# Patient Record
Sex: Female | Born: 1955 | Race: White | Hispanic: No | Marital: Married | Smoking: Never smoker
Health system: Southern US, Community
[De-identification: ages and names within clinical notes are randomized; demographics above are authoritative.]

## PROBLEM LIST (undated history)

## (undated) DIAGNOSIS — Z9889 Other specified postprocedural states: Secondary | ICD-10-CM

## (undated) DIAGNOSIS — I739 Peripheral vascular disease, unspecified: Secondary | ICD-10-CM

## (undated) DIAGNOSIS — D45 Polycythemia vera: Secondary | ICD-10-CM

## (undated) DIAGNOSIS — I2510 Atherosclerotic heart disease of native coronary artery without angina pectoris: Secondary | ICD-10-CM

## (undated) DIAGNOSIS — T7840XA Allergy, unspecified, initial encounter: Secondary | ICD-10-CM

## (undated) DIAGNOSIS — D649 Anemia, unspecified: Secondary | ICD-10-CM

## (undated) DIAGNOSIS — A630 Anogenital (venereal) warts: Secondary | ICD-10-CM

## (undated) DIAGNOSIS — F509 Eating disorder, unspecified: Secondary | ICD-10-CM

## (undated) DIAGNOSIS — R011 Cardiac murmur, unspecified: Secondary | ICD-10-CM

## (undated) DIAGNOSIS — E785 Hyperlipidemia, unspecified: Secondary | ICD-10-CM

## (undated) DIAGNOSIS — H269 Unspecified cataract: Secondary | ICD-10-CM

## (undated) DIAGNOSIS — M810 Age-related osteoporosis without current pathological fracture: Secondary | ICD-10-CM

## (undated) DIAGNOSIS — M1990 Unspecified osteoarthritis, unspecified site: Secondary | ICD-10-CM

## (undated) DIAGNOSIS — A239 Brucellosis, unspecified: Secondary | ICD-10-CM

## (undated) DIAGNOSIS — T8859XA Other complications of anesthesia, initial encounter: Secondary | ICD-10-CM

## (undated) DIAGNOSIS — R112 Nausea with vomiting, unspecified: Secondary | ICD-10-CM

## (undated) DIAGNOSIS — K219 Gastro-esophageal reflux disease without esophagitis: Secondary | ICD-10-CM

## (undated) HISTORY — DX: Polycythemia vera: D45

## (undated) HISTORY — DX: Anogenital (venereal) warts: A63.0

## (undated) HISTORY — DX: Gastro-esophageal reflux disease without esophagitis: K21.9

## (undated) HISTORY — DX: Peripheral vascular disease, unspecified: I73.9

## (undated) HISTORY — PX: CATARACT EXTRACTION W/ INTRAOCULAR LENS IMPLANT: SHX1309

## (undated) HISTORY — DX: Age-related osteoporosis without current pathological fracture: M81.0

## (undated) HISTORY — PX: TONSILLECTOMY: SUR1361

## (undated) HISTORY — PX: EYE SURGERY: SHX253

## (undated) HISTORY — DX: Unspecified cataract: H26.9

## (undated) HISTORY — DX: Cardiac murmur, unspecified: R01.1

## (undated) HISTORY — DX: Anemia, unspecified: D64.9

## (undated) HISTORY — DX: Eating disorder, unspecified: F50.9

## (undated) HISTORY — DX: Hyperlipidemia, unspecified: E78.5

## (undated) HISTORY — DX: Unspecified osteoarthritis, unspecified site: M19.90

## (undated) HISTORY — DX: Allergy, unspecified, initial encounter: T78.40XA

## (undated) HISTORY — DX: Brucellosis, unspecified: A23.9

## (undated) SURGERY — LEFT HEART CATH AND CORONARY ANGIOGRAPHY
Anesthesia: Moderate Sedation

---

## 2004-09-08 HISTORY — PX: BREAST REDUCTION SURGERY: SHX8

## 2013-09-08 HISTORY — PX: POLYPECTOMY: SHX149

## 2013-09-08 HISTORY — PX: COLONOSCOPY: SHX174

## 2013-10-09 LAB — HM PAP SMEAR

## 2013-11-05 LAB — HM MAMMOGRAPHY

## 2014-12-04 ENCOUNTER — Encounter: Payer: Self-pay | Admitting: Medical

## 2014-12-04 ENCOUNTER — Ambulatory Visit (INDEPENDENT_AMBULATORY_CARE_PROVIDER_SITE_OTHER): Payer: BLUE CROSS/BLUE SHIELD | Admitting: Medical

## 2014-12-04 ENCOUNTER — Telehealth: Payer: Self-pay | Admitting: Medical

## 2014-12-04 VITALS — BP 127/74 | HR 78 | Temp 98.4°F | Ht 64.0 in | Wt 120.2 lb

## 2014-12-04 DIAGNOSIS — R0600 Dyspnea, unspecified: Secondary | ICD-10-CM | POA: Diagnosis not present

## 2014-12-04 DIAGNOSIS — J309 Allergic rhinitis, unspecified: Secondary | ICD-10-CM | POA: Insufficient documentation

## 2014-12-04 DIAGNOSIS — J301 Allergic rhinitis due to pollen: Secondary | ICD-10-CM | POA: Diagnosis not present

## 2014-12-04 DIAGNOSIS — F5081 Binge eating disorder: Secondary | ICD-10-CM | POA: Insufficient documentation

## 2014-12-04 DIAGNOSIS — E785 Hyperlipidemia, unspecified: Secondary | ICD-10-CM | POA: Diagnosis not present

## 2014-12-04 DIAGNOSIS — E875 Hyperkalemia: Secondary | ICD-10-CM

## 2014-12-04 DIAGNOSIS — R002 Palpitations: Secondary | ICD-10-CM | POA: Diagnosis not present

## 2014-12-04 DIAGNOSIS — F508 Other eating disorders: Secondary | ICD-10-CM

## 2014-12-04 DIAGNOSIS — F50819 Binge eating disorder, unspecified: Secondary | ICD-10-CM | POA: Insufficient documentation

## 2014-12-04 LAB — COMPREHENSIVE METABOLIC PANEL
ALT: 16 U/L (ref 0–35)
AST: 21 U/L (ref 0–37)
Albumin: 4.6 g/dL (ref 3.5–5.2)
Alkaline Phosphatase: 75 U/L (ref 39–117)
BUN: 14 mg/dL (ref 6–23)
CO2: 34 mEq/L — ABNORMAL HIGH (ref 19–32)
Calcium: 10.5 mg/dL (ref 8.4–10.5)
Chloride: 100 mEq/L (ref 96–112)
Creatinine, Ser: 0.91 mg/dL (ref 0.40–1.20)
GFR: 67.53 mL/min (ref >60.00)
Glucose, Bld: 96 mg/dL (ref 70–99)
Potassium: 5.6 mEq/L — ABNORMAL HIGH (ref 3.5–5.1)
Sodium: 136 mEq/L (ref 135–145)
Total Bilirubin: 0.7 mg/dL (ref 0.2–1.2)
Total Protein: 8 g/dL (ref 6.0–8.3)

## 2014-12-04 LAB — LIPID PANEL
Cholesterol: 244 mg/dL — ABNORMAL HIGH (ref 0–200)
HDL: 48.5 mg/dL (ref >39.00)
LDL Cholesterol: 170 mg/dL — ABNORMAL HIGH (ref 0–99)
NonHDL: 195.5
Total CHOL/HDL Ratio: 5
Triglycerides: 130 mg/dL (ref 0.0–149.0)
VLDL: 26 mg/dL (ref 0.0–40.0)

## 2014-12-04 MED ORDER — ROSUVASTATIN CALCIUM 10 MG PO TABS: 10.0000 mg | ORAL_TABLET | Freq: Every day | ORAL | Status: DC

## 2014-12-04 MED ORDER — MOMETASONE FUROATE 50 MCG/ACT NA SUSP: NASAL | Status: DC

## 2014-12-04 MED ORDER — LORATADINE 10 MG PO TABS: 10.0000 mg | ORAL_TABLET | Freq: Every day | ORAL | Status: DC

## 2014-12-04 NOTE — Assessment & Plan Note (Signed)
With some rt eustachian tube dysfunction. Rt ear has small thin wax. Not enough to produce symptom in my opinion.   Rx claritin and nasonex.

## 2014-12-04 NOTE — Progress Notes (Signed)
Subjective:    Patient ID: Katrina Walters, female    DOB: Jun 21, 1956, 59 y.o.   MRN: [id number]  HPI   I have reviewed pt PMH, PSH, FH, Social History and Surgical History  Pt in for first time.   Hyperlipidemia- some mild-moderate elevation. I don't have labs. But I do have calcium score % of 98%.  Pt stats occasional feeling shortness of breath with minimal acitivity. This occurs with minimal activity. About 4 months since her last lipid panel. Some coffee only this morning. Pt mom had MI at 49 yo and passed away from.   Hx of dypsnea/sob mild activity and palpitations. ekg and holter monitor was negative. Pt never had further work up due to move.  Pt had colonoscopy in January. Polyp seen and told to repeat in 5 years.  Pt has some rt ear irritation. She thinks related to wax build up. Hx of cerumen impaction and needing to be irrigates about one time a year.   Pt has some runny nose recently past 2 days. Hx of allergies. (Allergic rhinitis)   Binge eating- Does this one time a week.(With sweets a lot in past). This has been the case since her early 20's. When young she would cause her self to vomit. But she would do that when younger.Never saw psychiatrist in the past for this.         Review of Systems  Constitutional: Negative for fever, chills and fatigue.  Respiratory: Negative for choking and chest tightness.   Cardiovascular: Negative for chest pain and palpitations.  Gastrointestinal: Negative for nausea, vomiting, abdominal pain, constipation, blood in stool and anal bleeding.  Musculoskeletal: Negative for back pain.  Neurological: Negative for dizziness, seizures, syncope, weakness, light-headedness, numbness and headaches.  Hematological: Negative for adenopathy. Does not bruise/bleed easily.  Psychiatric/Behavioral: Negative for suicidal ideas, behavioral problems and dysphoric mood. The patient is not nervous/anxious.        Eating disorder.   Past Medical  History  Diagnosis Date  . Eating disorder   . Genital warts   . Hyperlipidemia     History   Social History  . Marital Status: Married    Spouse Name: N/A  . Number of Children: N/A  . Years of Education: N/A   Occupational History  . unemployed at the time    Social History Main Topics  . Smoking status: Never Smoker   . Smokeless tobacco: Not on file  . Alcohol Use: No  . Drug Use: No  . Sexual Activity: Not on file   Other Topics Concern  . Not on file   Social History Narrative  . No narrative on file    Past Surgical History  Procedure Laterality Date  . Breast reduction surgery  2006    Family History  Problem Relation Age of Onset  . Heart disease Mother     died at 88 of heart attack  . Hypertension Mother   . Heart disease Father     had heart attack at age 65  . Cancer Paternal Aunt     Allergies  Allergen Reactions  . Amoxicillin     Had fever with flu like symptoms    No current outpatient prescriptions on file prior to visit.   No current facility-administered medications on file prior to visit.    BP 127/74 mmHg  Pulse 78  Temp(Src) 98.4 F (36.9 C) (Oral)  Ht 5\' 4"  (1.626 m)  Wt 120 lb 4 oz (54.545 kg)  BMI 20.63 kg/m2  SpO2 100%      Objective:   Physical Exam       General  Mental Status - Alert. General Appearance - Well groomed. Not in acute distress.  Skin Rashes- No Rashes.  HEENT Head- Normal. Ear Auditory Canal - Left- Normal. Right - Normal.Tympanic Membrane- Left- Normal. Right- small amount of wax present. Eye Sclera/Conjunctiva- Left- Normal. Right- Normal. Nose & Sinuses Nasal Mucosa- Left-  Mild  boggy + Congested. Right-  Mild  boggy +Congested. Mouth & Throat Lips: Upper Lip- Normal: no dryness, cracking, pallor, cyanosis, or vesicular eruption. Lower Lip-Normal: no dryness, cracking, pallor, cyanosis or vesicular eruption. Buccal Mucosa- Bilateral- No Aphthous ulcers. Oropharynx- No Discharge  or Erythema. Tonsils: Characteristics- Bilateral- No Erythema or Congestion. Size/Enlargement- Bilateral- No enlargement. Discharge- bilateral-None.   Neck Carotid Arteries- Normal color. Moisture- Normal Moisture. No carotid bruits. No JVD.  Chest and Lung Exam Auscultation: Breath Sounds:-Normal.  Cardiovascular Auscultation:Rythm- Regular. Murmurs & Other Heart Sounds:Auscultation of the heart reveals- No Murmurs.  Abdomen Inspection:-Inspeection Normal. Palpation/Percussion:Note:No mass. Palpation and Percussion of the abdomen reveal- Non Tender, Non Distended + BS, no rebound or guarding.    Neurologic Cranial Nerve exam:- CN III-XII intact(No nystagmus), symmetric smile. Drift Test:- No drift. Romberg Exam:- Negative.  Heal to Toe Gait exam:-Normal. Finger to Nose:- Normal/Intact Strength:- 5/5 equal and symmetric strength both upper and lower extremities.      Assessment & Plan:

## 2014-12-04 NOTE — Assessment & Plan Note (Signed)
Get fasting lipid panel today. Will decide on statin therapy due to family hx and calcium score. Sharren Bridge will start her on meds.

## 2014-12-04 NOTE — Assessment & Plan Note (Signed)
Due to complex nature of treatment recommend counseling and referal to psychiatrist. Card of counselor and list of psychiatrist info given.

## 2014-12-04 NOTE — Assessment & Plan Note (Signed)
Hx of but none presently. Holter in past was negative.

## 2014-12-04 NOTE — Assessment & Plan Note (Signed)
Hx of with minimal activity. Will refer to cardiologist for stress test due to family history.

## 2014-12-04 NOTE — Progress Notes (Signed)
Pre visit review using our clinic review tool, if applicable. No additional management support is needed unless otherwise documented below in the visit note. 

## 2014-12-04 NOTE — Patient Instructions (Addendum)
Hyperlipidemia Get fasting lipid panel today. Will decide on statin therapy due to family hx and calcium score. Katrina Walters will start her on meds.   Dyspnea Hx of with minimal activity. Will refer to cardiologist for stress test due to family history.   Palpitations Hx of but none presently. Holter in past was negative.   Allergic rhinitis With some rt eustachian tube dysfunction. Rt ear has small thin wax. Not enough to produce symptom in my opinion.   Rx claritin and nasonex.     No current dyspnea or palpitation but if any were to occur sigificantly pending cardiologist then ED evaluation.  Follow up in 4 wks or as needed.  Not if nasonex not covered then get flonase/fluticasone otc.

## 2014-12-04 NOTE — Telephone Encounter (Signed)
Will order cmp to check k in one week. Rx of crestor. Regarding crestor and colchicine interaction. I will notify pt.

## 2014-12-05 ENCOUNTER — Other Ambulatory Visit: Payer: Self-pay | Admitting: Medical

## 2014-12-05 DIAGNOSIS — E875 Hyperkalemia: Secondary | ICD-10-CM

## 2014-12-06 ENCOUNTER — Encounter: Payer: Self-pay | Admitting: Cardiology

## 2014-12-06 ENCOUNTER — Ambulatory Visit (INDEPENDENT_AMBULATORY_CARE_PROVIDER_SITE_OTHER): Payer: BLUE CROSS/BLUE SHIELD | Admitting: Cardiology

## 2014-12-06 VITALS — BP 120/80 | HR 69 | Ht 64.0 in | Wt 121.8 lb

## 2014-12-06 DIAGNOSIS — E785 Hyperlipidemia, unspecified: Secondary | ICD-10-CM | POA: Diagnosis not present

## 2014-12-06 DIAGNOSIS — R938 Abnormal findings on diagnostic imaging of other specified body structures: Secondary | ICD-10-CM | POA: Diagnosis not present

## 2014-12-06 DIAGNOSIS — R0602 Shortness of breath: Secondary | ICD-10-CM | POA: Diagnosis not present

## 2014-12-06 DIAGNOSIS — R0600 Dyspnea, unspecified: Secondary | ICD-10-CM | POA: Diagnosis not present

## 2014-12-06 DIAGNOSIS — R931 Abnormal findings on diagnostic imaging of heart and coronary circulation: Secondary | ICD-10-CM

## 2014-12-06 NOTE — Progress Notes (Signed)
Cardiology Office Note   Date:  12/06/2014   ID:  Katrina Walters, DOB 05/19/1956, MRN [id number]  PCP:  Mackie Pai, PA-C    Chief Complaint  Patient presents with  . Shortness of Breath  . Hyperlipidemia      History of Present Illness: Katrina Walters is a 59 y.o. female who presents for evaluation of DOE.  She has a history of dyslipidemia and increased calcium score (376) of chest CT at 98% (left main 40, LAD 90, LCx 76, RCA 170.  She recently saw her PCP and said that she was having some DOE with minimal activity.  She has a family history of premature CAD with her mom passing away from an MI at 59yo.  She has a history of palpitations in the past with normal Holter monitor.  She is very active and walks 1-2 miles at least 4 days weekly and 3-4 days weekly for 45 minutes on the treadmill.  She says that she may get out of breath when she starts to run on the treadmill and if she goes back to walking it will subside.  She notices that she gets more SOB with housework or when she is talking to someone while exercising.  She occasionally has some pain in her chest after exercising and doing yoga but thinks it is pulled muscles from the yoga.  She denies any dizziness or syncope.      Past Medical History  Diagnosis Date  . Eating disorder   . Genital warts   . Hyperlipidemia     Past Surgical History  Procedure Laterality Date  . Breast reduction surgery  2006     Current Outpatient Prescriptions  Medication Sig Dispense Refill  . COLCHICINE PO Take 1 tablet by mouth daily. Take 1mg  daily    . Garcinia Cambogia-Chromium 500-200 MG-MCG TABS Take 1 capsule by mouth daily.    [name] loratadine (CLARITIN) 10 MG tablet Take 1 tablet (10 mg total) by mouth daily. 30 tablet 0  . Melatonin 3 MG CAPS Take by mouth at bedtime.    . Misc Natural Products (COMPLETE MENOPAUSE HEALTH PO) Take by mouth daily.    . mometasone (NASONEX) 50 MCG/ACT nasal spray 2 sprays in each nostril q day 17 g  12  . Multiple Vitamin (MULTIVITAMIN) tablet Take 1 tablet by mouth daily.    . polyethylene glycol (MIRALAX / GLYCOLAX) packet Take 17 g by mouth daily.    . rosuvastatin (CRESTOR) 10 MG tablet Take 1 tablet (10 mg total) by mouth daily. 30 tablet 2   No current facility-administered medications for this visit.    Allergies:   Amoxicillin    Social History:  The patient  reports that she has never smoked. She does not have any smokeless tobacco history on file. She reports that she does not drink alcohol or use illicit drugs.   Family History:  The patient's family history includes Cancer in her paternal aunt; Heart disease in her father and mother; Hypertension in her mother.    ROS:  Please see the history of present illness.   Otherwise, review of systems are positive for none.   All other systems are reviewed and negative.    PHYSICAL EXAM: VS:  BP 120/80 mmHg  Pulse 69  Ht 5\' 4"  (1.626 m)  Wt 121 lb 12.8 oz (55.248 kg)  BMI 20.90 kg/m2 , BMI Body mass index is 20.9 kg/(m^2). GEN: Well nourished, well developed, in no acute distress HEENT:  normal Neck: no JVD, carotid bruits, or masses Cardiac: RRR; no murmurs, rubs, or gallops,no edema  Respiratory:  clear to auscultation bilaterally, normal work of breathing GI: soft, nontender, nondistended, + BS MS: no deformity or atrophy Skin: warm and dry, no rash Neuro:  Strength and sensation are intact Psych: euthymic mood, full affect   EKG:  EKG is ordered today. The ekg ordered today demonstrates NSR with no ST changes   Recent Labs: 12/04/2014: ALT 16; BUN 14; Creatinine 0.91; Potassium 5.6*; Sodium 136    Lipid Panel    Component Value Date/Time   CHOL 244* 12/04/2014 1154   TRIG 130.0 12/04/2014 1154   HDL 48.50 12/04/2014 1154   CHOLHDL 5 12/04/2014 1154   VLDL 26.0 12/04/2014 1154   LDLCALC 170* 12/04/2014 1154      Wt Readings from Last 3 Encounters:  12/06/14 121 lb 12.8 oz (55.248 kg)  12/04/14 120  lb 4 oz (54.545 kg)        ASSESSMENT AND PLAN:  1.  SOB with exertion.  She has a strong family history of premature CAD with her mom dying of an MI at 85yo.  Her CRF include post menopausal state, family history of premature CAD and dyslipidemia.  She had a calcium score done that was in the 98% and she was started on Crestor.  I will get a Stress myoview to rule out ischemia and check a 2D echo to assess LVF. 2.  Dyslipidemia - on crestor 3.  Coronary artery calcifications on Chest CT - she needs aggressive treatment of hyperlipidemia.  LDL goal is <70.  I also discussed the need for her to cut sweets out of her diet.     Current medicines are reviewed at length with the patient today.  The patient does not have concerns regarding medicines.  The following changes have been made:  no change  Labs/ tests ordered today include: see above assessment and plan No orders of the defined types were placed in this encounter.     Disposition:   FU with me in 1 year  Signed, Sueanne Margarita, MD  12/06/2014 10:34 AM    [facility] [place], [place], [place]  [address] Phone: [phone]; Fax: [phone]

## 2014-12-06 NOTE — Patient Instructions (Signed)
Your physician has requested that you have an echocardiogram. Echocardiography is a painless test that uses sound waves to create images of your heart. It provides your doctor with information about the size and shape of your heart and how well your heart's chambers and valves are working. This procedure takes approximately one hour. There are no restrictions for this procedure.  Dr. Radford Pax recommends you have a NUCLEAR STRESS TEST.  Your physician wants you to follow-up in: 1 year with Dr. Radford Pax. You will receive a reminder letter in the mail two months in advance. If you don't receive a letter, please call our office to schedule the follow-up appointment.

## 2014-12-13 ENCOUNTER — Other Ambulatory Visit (INDEPENDENT_AMBULATORY_CARE_PROVIDER_SITE_OTHER): Payer: BLUE CROSS/BLUE SHIELD

## 2014-12-13 DIAGNOSIS — E875 Hyperkalemia: Secondary | ICD-10-CM | POA: Diagnosis not present

## 2014-12-13 LAB — COMPREHENSIVE METABOLIC PANEL
ALT: 16 U/L (ref 0–35)
AST: 20 U/L (ref 0–37)
Albumin: 4.3 g/dL (ref 3.5–5.2)
Alkaline Phosphatase: 83 U/L (ref 39–117)
BUN: 15 mg/dL (ref 6–23)
CO2: 31 mEq/L (ref 19–32)
Calcium: 10.2 mg/dL (ref 8.4–10.5)
Chloride: 100 mEq/L (ref 96–112)
Creatinine, Ser: 0.83 mg/dL (ref 0.40–1.20)
GFR: 75.09 mL/min (ref >60.00)
Glucose, Bld: 94 mg/dL (ref 70–99)
Potassium: 4.7 mEq/L (ref 3.5–5.1)
Sodium: 135 mEq/L (ref 135–145)
Total Bilirubin: 0.6 mg/dL (ref 0.2–1.2)
Total Protein: 7.6 g/dL (ref 6.0–8.3)

## 2014-12-13 NOTE — Addendum Note (Signed)
Addended by: Modena Morrow D on: 12/13/2014 10:39 AM   Modules accepted: Orders

## 2014-12-18 ENCOUNTER — Encounter (HOSPITAL_COMMUNITY): Payer: BLUE CROSS/BLUE SHIELD

## 2014-12-18 ENCOUNTER — Other Ambulatory Visit (HOSPITAL_COMMUNITY): Payer: BLUE CROSS/BLUE SHIELD

## 2015-01-01 ENCOUNTER — Ambulatory Visit: Payer: BLUE CROSS/BLUE SHIELD | Admitting: Medical

## 2015-05-25 ENCOUNTER — Emergency Department (HOSPITAL_COMMUNITY)
Admission: EM | Admit: 2015-05-25 | Discharge: 2015-05-25 | Disposition: A | Discharge status: Home / Self Care | Payer: BLUE CROSS/BLUE SHIELD | Attending: Emergency Medicine | Admitting: Emergency Medicine

## 2015-05-25 ENCOUNTER — Encounter (HOSPITAL_COMMUNITY): Payer: Self-pay

## 2015-05-25 DIAGNOSIS — S0501XA Injury of conjunctiva and corneal abrasion without foreign body, right eye, initial encounter: Secondary | ICD-10-CM

## 2015-05-25 DIAGNOSIS — Y93G9 Activity, other involving cooking and grilling: Secondary | ICD-10-CM | POA: Diagnosis not present

## 2015-05-25 DIAGNOSIS — S0591XA Unspecified injury of right eye and orbit, initial encounter: Secondary | ICD-10-CM

## 2015-05-25 DIAGNOSIS — Z8619 Personal history of other infectious and parasitic diseases: Secondary | ICD-10-CM | POA: Diagnosis not present

## 2015-05-25 DIAGNOSIS — Z880 Allergy status to penicillin: Secondary | ICD-10-CM | POA: Diagnosis not present

## 2015-05-25 DIAGNOSIS — T23031A Burn of unspecified degree of multiple right fingers (nail), not including thumb, initial encounter: Secondary | ICD-10-CM | POA: Insufficient documentation

## 2015-05-25 DIAGNOSIS — Z7951 Long term (current) use of inhaled steroids: Secondary | ICD-10-CM | POA: Diagnosis not present

## 2015-05-25 DIAGNOSIS — Y272XXA Contact with hot fluids, undetermined intent, initial encounter: Secondary | ICD-10-CM | POA: Diagnosis not present

## 2015-05-25 DIAGNOSIS — Y9289 Other specified places as the place of occurrence of the external cause: Secondary | ICD-10-CM | POA: Insufficient documentation

## 2015-05-25 DIAGNOSIS — Y998 Other external cause status: Secondary | ICD-10-CM | POA: Insufficient documentation

## 2015-05-25 DIAGNOSIS — T2641XA Burn of right eye and adnexa, part unspecified, initial encounter: Secondary | ICD-10-CM | POA: Diagnosis present

## 2015-05-25 DIAGNOSIS — Z79899 Other long term (current) drug therapy: Secondary | ICD-10-CM | POA: Insufficient documentation

## 2015-05-25 DIAGNOSIS — T300 Burn of unspecified body region, unspecified degree: Secondary | ICD-10-CM

## 2015-05-25 DIAGNOSIS — E785 Hyperlipidemia, unspecified: Secondary | ICD-10-CM | POA: Diagnosis not present

## 2015-05-25 MED ORDER — FLUORESCEIN SODIUM 1 MG OP STRP
1.0000 | ORAL_STRIP | Freq: Once | OPHTHALMIC | Status: AC
  Administered 2015-05-25: 1 via OPHTHALMIC
  Filled 2015-05-25: qty 1

## 2015-05-25 MED ORDER — PROPARACAINE HCL 0.5 % OP SOLN
1.0000 [drp] | Freq: Once | OPHTHALMIC | Status: AC
  Administered 2015-05-25: 1 [drp] via OPHTHALMIC
  Filled 2015-05-25: qty 15

## 2015-05-25 MED ORDER — TETRACAINE HCL 0.5 % OP SOLN
1.0000 [drp] | Freq: Once | OPHTHALMIC | Status: DC
  Filled 2015-05-25: qty 2

## 2015-05-25 MED ORDER — IBUPROFEN 800 MG PO TABS
800.0000 mg | ORAL_TABLET | Freq: Once | ORAL | Status: AC
  Administered 2015-05-25: 800 mg via ORAL
  Filled 2015-05-25: qty 1

## 2015-05-25 MED ORDER — ERYTHROMYCIN 5 MG/GM OP OINT: TOPICAL_OINTMENT | OPHTHALMIC | Status: DC
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
## 2016-05-01 ENCOUNTER — Encounter: Payer: BLUE CROSS/BLUE SHIELD | Admitting: Medical

## 2016-05-15 ENCOUNTER — Encounter: Payer: Self-pay | Admitting: Medical

## 2016-05-15 ENCOUNTER — Ambulatory Visit (HOSPITAL_BASED_OUTPATIENT_CLINIC_OR_DEPARTMENT_OTHER)
Admission: RE | Admit: 2016-05-15 | Discharge: 2016-05-15 | Disposition: A | Discharge status: Home / Self Care | Payer: BLUE CROSS/BLUE SHIELD | Source: Ambulatory Visit | Attending: Medical | Admitting: Medical

## 2016-05-15 ENCOUNTER — Ambulatory Visit (INDEPENDENT_AMBULATORY_CARE_PROVIDER_SITE_OTHER): Payer: BLUE CROSS/BLUE SHIELD | Admitting: Medical

## 2016-05-15 VITALS — BP 127/76 | HR 72 | Temp 98.1°F | Ht 64.0 in | Wt 117.2 lb

## 2016-05-15 DIAGNOSIS — R5383 Other fatigue: Secondary | ICD-10-CM | POA: Diagnosis not present

## 2016-05-15 DIAGNOSIS — Z1239 Encounter for other screening for malignant neoplasm of breast: Secondary | ICD-10-CM

## 2016-05-15 DIAGNOSIS — E049 Nontoxic goiter, unspecified: Secondary | ICD-10-CM | POA: Diagnosis not present

## 2016-05-15 DIAGNOSIS — E010 Iodine-deficiency related diffuse (endemic) goiter: Secondary | ICD-10-CM

## 2016-05-15 DIAGNOSIS — L989 Disorder of the skin and subcutaneous tissue, unspecified: Secondary | ICD-10-CM

## 2016-05-15 DIAGNOSIS — G4700 Insomnia, unspecified: Secondary | ICD-10-CM

## 2016-05-15 DIAGNOSIS — M109 Gout, unspecified: Secondary | ICD-10-CM | POA: Diagnosis not present

## 2016-05-15 DIAGNOSIS — I700 Atherosclerosis of aorta: Secondary | ICD-10-CM | POA: Diagnosis not present

## 2016-05-15 DIAGNOSIS — E785 Hyperlipidemia, unspecified: Secondary | ICD-10-CM

## 2016-05-15 DIAGNOSIS — R0789 Other chest pain: Secondary | ICD-10-CM

## 2016-05-15 DIAGNOSIS — R0600 Dyspnea, unspecified: Secondary | ICD-10-CM | POA: Diagnosis not present

## 2016-05-15 DIAGNOSIS — D179 Benign lipomatous neoplasm, unspecified: Secondary | ICD-10-CM

## 2016-05-15 DIAGNOSIS — M8580 Other specified disorders of bone density and structure, unspecified site: Secondary | ICD-10-CM

## 2016-05-15 LAB — LIPID PANEL
Cholesterol: 240 mg/dL — ABNORMAL HIGH (ref 0–200)
HDL: 38.3 mg/dL — ABNORMAL LOW (ref >39.00)
LDL Cholesterol: 174 mg/dL — ABNORMAL HIGH (ref 0–99)
NonHDL: 201.89
Total CHOL/HDL Ratio: 6
Triglycerides: 138 mg/dL (ref 0.0–149.0)
VLDL: 27.6 mg/dL (ref 0.0–40.0)

## 2016-05-15 LAB — TSH: TSH: 2.99 u[IU]/mL (ref 0.35–4.50)

## 2016-05-15 LAB — COMPREHENSIVE METABOLIC PANEL
ALT: 15 U/L (ref 0–35)
AST: 23 U/L (ref 0–37)
Albumin: 4.4 g/dL (ref 3.5–5.2)
Alkaline Phosphatase: 76 U/L (ref 39–117)
BUN: 12 mg/dL (ref 6–23)
CO2: 31 mEq/L (ref 19–32)
Calcium: 9.5 mg/dL (ref 8.4–10.5)
Chloride: 103 mEq/L (ref 96–112)
Creatinine, Ser: 0.78 mg/dL (ref 0.40–1.20)
GFR: 80.28 mL/min (ref >60.00)
Glucose, Bld: 86 mg/dL (ref 70–99)
Potassium: 4.4 mEq/L (ref 3.5–5.1)
Sodium: 138 mEq/L (ref 135–145)
Total Bilirubin: 0.5 mg/dL (ref 0.2–1.2)
Total Protein: 7.6 g/dL (ref 6.0–8.3)

## 2016-05-15 LAB — CBC WITH DIFFERENTIAL/PLATELET
Basophils Absolute: 0 10*3/uL (ref 0.0–0.1)
Basophils Relative: 0.6 % (ref 0.0–3.0)
Eosinophils Absolute: 0.2 10*3/uL (ref 0.0–0.7)
Eosinophils Relative: 3.7 % (ref 0.0–5.0)
HCT: 44.6 % (ref 36.0–46.0)
Hemoglobin: 15.1 g/dL — ABNORMAL HIGH (ref 12.0–15.0)
Lymphocytes Relative: 24.2 % (ref 12.0–46.0)
Lymphs Abs: 1.5 10*3/uL (ref 0.7–4.0)
MCHC: 33.9 g/dL (ref 30.0–36.0)
MCV: 85.9 fl (ref 78.0–100.0)
Monocytes Absolute: 0.5 10*3/uL (ref 0.1–1.0)
Monocytes Relative: 8.5 % (ref 3.0–12.0)
Neutro Abs: 3.9 10*3/uL (ref 1.4–7.7)
Neutrophils Relative %: 63 % (ref 43.0–77.0)
Platelets: 284 10*3/uL (ref 150.0–400.0)
RBC: 5.19 Mil/uL — ABNORMAL HIGH (ref 3.87–5.11)
RDW: 16.8 % — ABNORMAL HIGH (ref 11.5–15.5)
WBC: 6.2 10*3/uL (ref 4.0–10.5)

## 2016-05-15 LAB — URIC ACID: Uric Acid, Serum: 3.8 mg/dL (ref 2.4–7.0)

## 2016-05-15 MED ORDER — TRAZODONE HCL 50 MG PO TABS
25.0000 mg | ORAL_TABLET | Freq: Every evening | ORAL | 0 refills | Status: DC | PRN
Start: 2016-05-15 — End: 2016-07-14

## 2016-05-15 NOTE — Progress Notes (Signed)
Subjective:    Patient ID: Katrina Walters, female    DOB: [birth year], 60 y.o.   MRN: [id number]  HPI     Pt last mammogram 10-2013(was normal) and last pap was 10-2015(was normal).  Last colonscopy- 2015.   Pt has some small fat bumps rt medial  Thigh maybe present for one year. Pt last dexa was in 2015. Pt states rt hip osteopenia. But no recheck since.  Rt scalp- small lump Small faint red bump. Present maybe for years. Pt states hair dresser saw area in February. Are is not tender.  Pt states faint rt ear area feels little blocked.   Pt also mentions at times feeling sob with acitivities. No wheezing or whistle sounds. Pt gets this a while with mild activity. Pt has no signiciant hx of smoking. Only smoked 2 months age 79 yo.  Pt has occasoioal chest pain on and off. Random often at rest. About 2 weeks ago was last time. None presently.  I had referred her to cardiologist and they had plan in place.  Below cardiolgist note. 1.  SOB with exertion.  She has a strong family history of premature CAD with her mom dying of an MI at 48yo.  Her CRF include post menopausal state, family history of premature CAD and dyslipidemia.  She had a calcium score done that was in the 98% and she was started on Crestor.  I will get a Stress myoview to rule out ischemia and check a 2D echo to assess LVF. 2.  Dyslipidemia - on crestor 3.  Coronary artery calcifications on Chest CT - she needs aggressive treatment of hyperlipidemia.  LDL goal is <70.  I also discussed the need for her to cut sweets out of her diet.     Current medicines are reviewed at length with the patient today.  The patient does not have concerns regarding medicines.  The following changes have been made:  no change  Labs/ tests ordered today include: see above assessment and plan No orders of the defined types were placed in this encounter.   Disposition:   FU with me in 1 year  End cardiolgoist note.  Pt also complains  of fatigue and insomnia for years.     Review of Systems  Constitutional: Positive for fatigue. Negative for chills and fever.  HENT: Negative for congestion.        See hpi. Rt ear.  Respiratory: Positive for shortness of breath. Negative for cough, chest tightness and wheezing.        See hpi. chroinc daily not worse today.  Cardiovascular: Negative for chest pain.       See hpu  Gastrointestinal: Negative for abdominal pain, constipation, diarrhea and vomiting.  Endocrine: Negative for polydipsia and polyphagia.  Genitourinary: Negative for difficulty urinating and dysuria.  Musculoskeletal: Negative for arthralgias and back pain.       No leg pain.  Skin: Negative for rash.       See hpi  Neurological: Negative for dizziness and headaches.  Hematological: Negative for adenopathy. Does not bruise/bleed easily.  Psychiatric/Behavioral: Positive for sleep disturbance. Negative for agitation, confusion and suicidal ideas. The patient is not nervous/anxious.        Objective:   Physical Exam  General Mental Status- Alert. General Appearance- Not in acute distress.   Skin General: Color- Normal Color. Moisture- Normal Moisture. Rt side scalp 7 mm raised red lesion. Maybe hemangioma.  Heent- negative but rt ear canal very small and  very small amount of was blocking view of tim.  Neck Carotid Arteries- Normal color. Moisture- Normal Moisture. No carotid bruits. No JVD. Thyroid feels mild enlarged.  Chest and Lung Exam Auscultation: Breath Sounds:-Normal.  Cardiovascular Auscultation:Rythm- Regular. Murmurs & Other Heart Sounds:Auscultation of the heart reveals- No Murmurs.  Abdomen Inspection:-Inspeection Normal. Palpation/Percussion:Note:No mass. Palpation and Percussion of the abdomen reveal- Non Tender, Non Distended + BS, no rebound or guarding.    Neurologic Cranial Nerve exam:- CN III-XII intact(No nystagmus), symmetric smile. Strength:- 5/5 equal and  symmetric strength both upper and lower extremities.  Skin- rt parietal area small red raised area 33mm side. Irregular.(hemangioma) Both thighs- rt side small lipoma present(likley)t. Lt side moderate lipoma(likely)      Assessment & Plan:  ekg- sinus rhythm. But mild bradycardia.  For your chronic dypsnea and atypical chest pain we got ekg and looked ok. Also will get cxr today and refer you back to cardiologist. If you have any severe type symptoms during interim then ED evaluation.  For fatigue will get labs.  For high cholesterol include lipid panel  For thin bone hx will order dexascan.  For skin lesion scalp refer to derm  For lipoma refer to surgeon.  For insomnia will rx trazadone.  For probable enlarged thyroid order thyroid US/neck.  For gout order uric acid.  For insomnia rx trazadone.  Follow up in 2 weeks or as needed  For rt ear wax recommend 3-5 days use of debrox and then on follow up try to lavage.  Note since pt had so many complaints today decided best not to do CPE today. She was understanding. But a lot of elements of cpe were handled.[name]

## 2016-05-15 NOTE — Patient Instructions (Addendum)
For your chronic dypsnea and atypical chest pain we got ekg and looked ok. Also will get cxr today and refer you back to cardiologist. If you have any severe type symptoms during interim then ED evaluation.  For fatigue will get labs.  For high cholesterol include lipid panel'  For thin bone hx will order dexaxan.  For skin lesion scalp refer to derm  For lipoma refer to surgeon.  For insomnia will rx trazadone.  For probable enlarged thyroid order thyroid US/neck.  For gout order uric acid.  For insomnia rx trazadone.  Follow up in 2 weeks or as needed  Anderson Malta is our office contact on referrals.  For rt ear wax recommend 3-5 days use of debrox and then on follow up try to lavage.

## 2016-05-16 ENCOUNTER — Ambulatory Visit (HOSPITAL_BASED_OUTPATIENT_CLINIC_OR_DEPARTMENT_OTHER)
Admission: RE | Admit: 2016-05-16 | Discharge: 2016-05-16 | Disposition: A | Discharge status: Home / Self Care | Payer: BLUE CROSS/BLUE SHIELD | Source: Ambulatory Visit | Attending: Medical | Admitting: Medical

## 2016-05-16 DIAGNOSIS — M810 Age-related osteoporosis without current pathological fracture: Secondary | ICD-10-CM | POA: Diagnosis not present

## 2016-05-16 DIAGNOSIS — Z780 Asymptomatic menopausal state: Secondary | ICD-10-CM | POA: Insufficient documentation

## 2016-05-16 DIAGNOSIS — M8580 Other specified disorders of bone density and structure, unspecified site: Secondary | ICD-10-CM

## 2016-05-16 DIAGNOSIS — E049 Nontoxic goiter, unspecified: Secondary | ICD-10-CM | POA: Diagnosis present

## 2016-05-16 DIAGNOSIS — E041 Nontoxic single thyroid nodule: Secondary | ICD-10-CM | POA: Insufficient documentation

## 2016-05-16 DIAGNOSIS — E010 Iodine-deficiency related diffuse (endemic) goiter: Secondary | ICD-10-CM

## 2016-05-16 LAB — T4: T4, Total: 8.1 ug/dL (ref 4.5–12.0)

## 2016-05-18 ENCOUNTER — Telehealth: Payer: Self-pay | Admitting: Medical

## 2016-05-18 DIAGNOSIS — M810 Age-related osteoporosis without current pathological fracture: Secondary | ICD-10-CM

## 2016-05-18 MED ORDER — ALENDRONATE SODIUM 10 MG PO TABS: 10.0000 mg | ORAL_TABLET | Freq: Every day | ORAL | 5 refills | Status: DC

## 2016-05-18 NOTE — Telephone Encounter (Signed)
Vit d lab order placed and sent in rx of fosamax.

## 2016-05-22 ENCOUNTER — Telehealth: Payer: Self-pay | Admitting: Medical

## 2016-05-22 ENCOUNTER — Other Ambulatory Visit (INDEPENDENT_AMBULATORY_CARE_PROVIDER_SITE_OTHER): Payer: BLUE CROSS/BLUE SHIELD

## 2016-05-22 ENCOUNTER — Ambulatory Visit (HOSPITAL_BASED_OUTPATIENT_CLINIC_OR_DEPARTMENT_OTHER)
Admission: RE | Admit: 2016-05-22 | Discharge: 2016-05-22 | Disposition: A | Discharge status: Home / Self Care | Payer: BLUE CROSS/BLUE SHIELD | Source: Ambulatory Visit | Attending: Medical | Admitting: Medical

## 2016-05-22 DIAGNOSIS — Z1231 Encounter for screening mammogram for malignant neoplasm of breast: Secondary | ICD-10-CM | POA: Insufficient documentation

## 2016-05-22 DIAGNOSIS — Z1239 Encounter for other screening for malignant neoplasm of breast: Secondary | ICD-10-CM

## 2016-05-22 DIAGNOSIS — M810 Age-related osteoporosis without current pathological fracture: Secondary | ICD-10-CM

## 2016-05-22 LAB — VITAMIN D 25 HYDROXY (VIT D DEFICIENCY, FRACTURES): VITD: 24.38 ng/mL — ABNORMAL LOW (ref 30.00–100.00)

## 2016-05-22 MED ORDER — VITAMIN D (ERGOCALCIFEROL) 1.25 MG (50000 UNIT) PO CAPS: 50000.0000 [IU] | ORAL_CAPSULE | ORAL | 0 refills | Status: DC

## 2016-05-23 ENCOUNTER — Telehealth: Payer: Self-pay | Admitting: Medical

## 2016-05-23 NOTE — Telephone Encounter (Signed)
Pt is returning call for lab results.     Please call back to assist further.

## 2016-05-25 MED ORDER — ALBUTEROL SULFATE HFA 108 (90 BASE) MCG/ACT IN AERS: 2.0000 | INHALATION_SPRAY | Freq: Four times a day (QID) | RESPIRATORY_TRACT | 0 refills | Status: DC | PRN

## 2016-05-25 NOTE — Telephone Encounter (Signed)
Let pt know I wrote rx for inhaler. Try the inhaler and will see if her short of breath sensation is helped.

## 2016-05-27 NOTE — Telephone Encounter (Signed)
Opened to review and send in meds.

## 2016-05-29 ENCOUNTER — Encounter: Payer: Self-pay | Admitting: Medical

## 2016-05-29 ENCOUNTER — Ambulatory Visit (INDEPENDENT_AMBULATORY_CARE_PROVIDER_SITE_OTHER): Payer: BLUE CROSS/BLUE SHIELD | Admitting: Medical

## 2016-05-29 ENCOUNTER — Ambulatory Visit (HOSPITAL_BASED_OUTPATIENT_CLINIC_OR_DEPARTMENT_OTHER): Payer: BLUE CROSS/BLUE SHIELD

## 2016-05-29 VITALS — BP 134/71 | HR 65 | Temp 98.3°F | Ht 64.0 in | Wt 118.4 lb

## 2016-05-29 DIAGNOSIS — R0600 Dyspnea, unspecified: Secondary | ICD-10-CM | POA: Diagnosis not present

## 2016-05-29 DIAGNOSIS — R7989 Other specified abnormal findings of blood chemistry: Secondary | ICD-10-CM | POA: Diagnosis not present

## 2016-05-29 DIAGNOSIS — E785 Hyperlipidemia, unspecified: Secondary | ICD-10-CM | POA: Diagnosis not present

## 2016-05-29 MED ORDER — ROSUVASTATIN CALCIUM 10 MG PO TABS: 10.0000 mg | ORAL_TABLET | Freq: Every day | ORAL | 2 refills | Status: DC

## 2016-05-29 MED FILL — VIT D2 1.25 MG (50,000 UNIT: 1.25 MG | 28 days supply | Qty: 4 | Fill #0 | Status: TO

## 2016-05-29 MED FILL — ROSUVASTATIN CALCIUM 10 MG: 10 | 30 days supply | Qty: 30 | Fill #0

## 2016-05-29 NOTE — Patient Instructions (Addendum)
For your episodes of shortness of breath on activity, I want you to take the albuterol inhaler. See how this helps. Also please keep cardiology appointment. Notify them of your response to albuterol  For low vitamin D, please start vitamin D level rx and then repeat d level after you finish the rx.  For osteopenia and osteoporosis please take fosamax.  For high cholesterol will start you on crestor. Will repeat your lipid panel in 3 months fasting.(sent downstairs to discuss colchicine and crestor potential interaction. Benefit exceeds risk in my opinion. But if any new muscle pain occurs while on crestor stop and notify us.)  Follow up in 3 months or as needed

## 2016-05-29 NOTE — Progress Notes (Signed)
Subjective:    Patient ID: Katrina Walters, female    DOB: [birth year], 60 y.o.   MRN: [id number]  HPI   Pt in for follow up.   Pt has hx of some intermittent sob on activity in the past. See prior note. Cxr mentioned copd vs RAD. Pt has not had any significant smoking hx. Only 2 cigaretes. I sent in inhaler to pt try. See if this improves her breathing. Also she is seeing cardiologist on Jun 13, 2016.   Pt has hyperlipidemia. Pt never used a statin. She is willing to use crestor. Pt never used statin.    Pt has low vitamin D. She will get rx I sent in. Also she is on a bisphophanate.   Pt takes colchicine for Mediterranean fever. As long as take colchine for years she has not had problems.   Review of Systems  Constitutional: Negative for chills, fatigue and fever.  Respiratory: Positive for shortness of breath. Negative for cough, choking, chest tightness and wheezing.        Not now but see hpi.  Cardiovascular: Negative for chest pain and palpitations.  Gastrointestinal: Negative for abdominal pain.  Endocrine: Negative for polydipsia, polyphagia and polyuria.  Musculoskeletal: Negative for back pain and gait problem.  Skin: Negative for rash.  Neurological: Negative for dizziness, seizures, syncope, numbness and headaches.  Psychiatric/Behavioral: Negative for behavioral problems and confusion.    Past Medical History:  Diagnosis Date  . Eating disorder   . Genital warts   . Hyperlipidemia      Social History   Social History  . Marital status: Married    Spouse name: N/A  . Number of children: N/A  . Years of education: N/A   Occupational History  . unemployed at the time    Social History Main Topics  . Smoking status: Never Smoker  . Smokeless tobacco: Not on file  . Alcohol use No  . Drug use: No  . Sexual activity: Not on file   Other Topics Concern  . Not on file   Social History Narrative  . No narrative on file    Past Surgical History:    Procedure Laterality Date  . BREAST REDUCTION SURGERY  2006    Family History  Problem Relation Age of Onset  . Heart disease Mother     died at 52 of heart attack  . Hypertension Mother   . Heart disease Father     had heart attack at age 78  . Cancer Paternal Aunt     Allergies  Allergen Reactions  . Amoxicillin     Had fever with flu like symptoms    Current Outpatient Prescriptions on File Prior to Visit  Medication Sig Dispense Refill  . albuterol (PROVENTIL HFA;VENTOLIN HFA) 108 (90 Base) MCG/ACT inhaler Inhale 2 puffs into the lungs every 6 (six) hours as needed for wheezing or shortness of breath. 1 Inhaler 0  . alendronate (FOSAMAX) 10 MG tablet Take 1 tablet (10 mg total) by mouth daily before breakfast. Take with a full glass of water on an empty stomach. 30 tablet 5  . COLCHICINE PO Take 1 tablet by mouth daily. Take 1mg  daily    . erythromycin ophthalmic ointment Place a 1/2 inch ribbon of ointment into the lower eyelid 4 times a day for 5 days 1 g 0  . Garcinia Cambogia-Chromium 500-200 MG-MCG TABS Take 1 capsule by mouth daily.    [name] loratadine (CLARITIN) 10 MG tablet Take 1  tablet (10 mg total) by mouth daily. 30 tablet 0  . Melatonin 3 MG CAPS Take by mouth at bedtime.    . Misc Natural Products (COMPLETE MENOPAUSE HEALTH PO) Take by mouth daily.    . mometasone (NASONEX) 50 MCG/ACT nasal spray 2 sprays in each nostril q day 17 g 12  . Multiple Vitamin (MULTIVITAMIN) tablet Take 1 tablet by mouth daily.    . polyethylene glycol (MIRALAX / GLYCOLAX) packet Take 17 g by mouth daily.    . rosuvastatin (CRESTOR) 10 MG tablet Take 1 tablet (10 mg total) by mouth daily. 30 tablet 2  . traZODone (DESYREL) 50 MG tablet Take 0.5-1 tablets (25-50 mg total) by mouth at bedtime as needed for sleep. 30 tablet 0  . Vitamin D, Ergocalciferol, (DRISDOL) 50000 units CAPS capsule Take 1 capsule (50,000 Units total) by mouth every 7 (seven) days. 6 capsule 0   No current  facility-administered medications on file prior to visit.     BP 134/71   Pulse 65   Temp 98.3 F (36.8 C) (Oral)   Ht 5\' 4"  (1.626 m)   Wt 118 lb 6.4 oz (53.7 kg)   SpO2 100%   BMI 20.32 kg/m       Objective:   Physical Exam  General Mental Status- Alert. General Appearance- Not in acute distress.   Skin General: Color- Normal Color. Moisture- Normal Moisture.  Neck Carotid Arteries- Normal color. Moisture- Normal Moisture. No carotid bruits. No JVD.  Chest and Lung Exam Auscultation: Breath Sounds:-Normal.  Cardiovascular Auscultation:Rythm- Regular. Murmurs & Other Heart Sounds:Auscultation of the heart reveals- No Murmurs.  Abdomen Inspection:-Inspeection Normal. Palpation/Percussion:Note:No mass. Palpation and Percussion of the abdomen reveal- Non Tender, Non Distended + BS, no rebound or guarding.no bruit.    Neurologic Cranial Nerve exam:- CN III-XII intact(No nystagmus), symmetric smile. Strength:- 5/5 equal and symmetric strength both upper and lower extremities.      Assessment & Plan:  For your episodes of shortness of breath on activity, I want you to take the albuterol inhaler. See how this helps. Also please keep cardiology appointment. Notify them of your response to albuterol  For low vitamin D, please start vitamin D level rx and then repeat d level after you finish the rx.  For osteopenia and osteoporosis please take fosamax.  For high cholesterol will start you on crestor. Will repeat your lipid panel in 3 months fasting. (sent downstairs to discuss colchicine and crestor potential interaction. Benefit exceeds risk in my opinion. But if any new muscle pain occurs while on crestor stop and notify us.)  Follow up in 3 months or as needed  Katrina Walters, [name], [name]

## 2016-06-13 ENCOUNTER — Ambulatory Visit (INDEPENDENT_AMBULATORY_CARE_PROVIDER_SITE_OTHER): Payer: BLUE CROSS/BLUE SHIELD | Admitting: Cardiovascular Disease

## 2016-06-13 ENCOUNTER — Encounter: Payer: Self-pay | Admitting: Cardiovascular Disease

## 2016-06-13 VITALS — BP 116/70 | HR 72 | Ht 64.0 in | Wt 119.2 lb

## 2016-06-13 DIAGNOSIS — R002 Palpitations: Secondary | ICD-10-CM

## 2016-06-13 DIAGNOSIS — R079 Chest pain, unspecified: Secondary | ICD-10-CM | POA: Diagnosis not present

## 2016-06-13 DIAGNOSIS — R0602 Shortness of breath: Secondary | ICD-10-CM

## 2016-06-13 DIAGNOSIS — A239 Brucellosis, unspecified: Secondary | ICD-10-CM | POA: Insufficient documentation

## 2016-06-13 HISTORY — DX: Brucellosis, unspecified: A23.9

## 2016-06-13 MED ORDER — ASPIRIN EC 81 MG PO TBEC: 81.0000 mg | DELAYED_RELEASE_TABLET | Freq: Every day | ORAL | Status: DC

## 2016-06-13 NOTE — Patient Instructions (Addendum)
Medication Instructions:  Your physician has recommended you make the following change in your medication:  1. START Aspirin 81mg  take one tablet by mouth daily  Labwork: No new orders.   Testing/Procedures: Your physician has requested that you have an exercise tolerance test. For further information please visit [email or web address]. Please also follow instruction sheet, as given. PLEASE BRING INHALER TO THIS APPOINTMENT.   Follow-Up: Your physician wants you to follow-up in: 1 YEAR with Dr Oval Linsey.  You will receive a reminder letter in the mail two months in advance. If you don't receive a letter, please call our office to schedule the follow-up appointment.   Any Other Special Instructions Will Be Listed Below (If Applicable).     If you need a refill on your cardiac medications before your next appointment, please call your pharmacy.

## 2016-06-13 NOTE — Progress Notes (Signed)
Cardiology Office Note   Date:  06/13/2016   ID:  Katrina Walters, DOB [birth year], MRN [id number]  PCP:  Mackie Pai, PA-C  Cardiologist:   Skeet Latch, MD   Chief Complaint  Patient presents with  . New Patient (Initial Visit)    chest discomfort, fatigue with exertion      History of Present Illness: Katrina Walters is a 60 y.o. female with shortness of breath, hyperlipidemia and who presents for an evaluation of shortness of breath.  Katrina Walters saw Mackie Pai, PA-C, on 05/29/16.  At that appointment she reported some episodes of shortness of breath.  She walks for 4-5 days per week and has noted that she can't walk and and talk at the same time.  She denies chest pain, nausea or diaphoresis.  She has a family history of premature CAD and wanted to make sure this wasn't a problem with her heart.  Katrina Walters Had a coronary calcium score of 376 on 08/2014, which was 35 percentile for age and gender. She was evaluated by Dr. Fransico Him 11/2014 and referred for stress testing. However she was unable to complete the test at that time due to cost.  At her appointment with Mr. Harvie Heck it was recommended that she start on rosuvastatin. However, she was concerned that it may interact with her colchicine and has not yet started to take it.  She also reports palpitations that last for a few seconds and occur every one or 2 months. There is no associated shortness of breath or lightheadedness. She sometimes does have lower extremity edema but it only occurs after standing for prolonged periods of time and increased elevation. She denies orthopnea or PND. Her mother had a heart attack in her 75s in her father had coronary artery bypass grafting at an older age.   Past Medical History:  Diagnosis Date  . Eating disorder   . Genital warts   . Hyperlipidemia   . 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 BREAST REDUCTION SURGERY  2006     Current  Outpatient Prescriptions  Medication Sig Dispense Refill  . albuterol (PROVENTIL HFA;VENTOLIN HFA) 108 (90 Base) MCG/ACT inhaler Inhale 2 puffs into the lungs every 6 (six) hours as needed for wheezing or shortness of breath. 1 Inhaler 0  . alendronate (FOSAMAX) 10 MG tablet Take 1 tablet (10 mg total) by mouth daily before breakfast. Take with a full glass of water on an empty stomach. 30 tablet 5  . COLCHICINE PO Take 1 tablet by mouth daily. Take 1mg  daily    . Melatonin 3 MG CAPS Take 1 capsule by mouth at bedtime.     . Misc Natural Products (COMPLETE MENOPAUSE HEALTH PO) Take 1 Dose by mouth daily.     . Multiple Vitamin (MULTIVITAMIN) tablet Take 1 tablet by mouth daily.    [name] PREMARIN vaginal cream Place 0.5 g vaginally 2 (two) times a week.  4  . rosuvastatin (CRESTOR) 10 MG tablet Take 1 tablet (10 mg total) by mouth daily. Please educate on potential colcrys and crestor interaction. 30 tablet 2  . traZODone (DESYREL) 50 MG tablet Take 0.5-1 tablets (25-50 mg total) by mouth at bedtime as needed for sleep. 30 tablet 0  . Vitamin D, Ergocalciferol, (DRISDOL) 50000 units CAPS capsule Take 1 capsule (50,000 Units total) by mouth every 7 (seven) days. 6 capsule 0  . aspirin EC 81 MG tablet Take 1 tablet (81  mg total) by mouth daily.     No current facility-administered medications for this visit.     Allergies:   Amoxicillin    Social History:  The patient  reports that she has never smoked. She has never used smokeless tobacco. She reports that she does not drink alcohol or use drugs.   Family History:  The patient's family history includes Cancer in her paternal aunt; Heart attack in her father and mother; Heart disease in her father and mother; Hypertension in her mother.    ROS:  Please see the history of present illness.   Otherwise, review of systems are positive for none.   All other systems are reviewed and negative.    PHYSICAL EXAM: VS:  BP 116/70   Pulse 72   Ht 5\' 4"   (1.626 m)   Wt 119 lb 3.2 oz (54.1 kg)   BMI 20.46 kg/m  , BMI Body mass index is 20.46 kg/m. GENERAL:  Well appearing HEENT:  Pupils equal round and reactive, fundi not visualized, oral mucosa unremarkable NECK:  No jugular venous distention, waveform within normal limits, carotid upstroke brisk and symmetric, no bruits, no thyromegaly LYMPHATICS:  No cervical adenopathy LUNGS:  Clear to auscultation bilaterally HEART:  RRR.  PMI not displaced or sustained,S1 and S2 within normal limits, no S3, no S4, no clicks, no rubs, no murmurs ABD:  Flat, positive bowel sounds normal in frequency in pitch, no bruits, no rebound, no guarding, no midline pulsatile mass, no hepatomegaly, no splenomegaly EXT:  2 plus pulses throughout, no edema, no cyanosis no clubbing SKIN:  No rashes no nodules NEURO:  Cranial nerves II through XII grossly intact, motor grossly intact throughout PSYCH:  Cognitively intact, oriented to person place and time    EKG:  EKG is not ordered today. The ekg ordered 05/15/16 demonstrates sinus bradycardia. Rate 59 bpm.  Recent Labs: 05/15/2016: ALT 15; BUN 12; Creatinine, Ser 0.78; Hemoglobin 15.1; Platelets 284.0; Potassium 4.4; Sodium 138; TSH 2.99   Lipid Panel    Component Value Date/Time   CHOL 240 (H) 05/15/2016 1107   TRIG 138.0 05/15/2016 1107   HDL 38.30 (L) 05/15/2016 1107   CHOLHDL 6 05/15/2016 1107   VLDL 27.6 05/15/2016 1107   LDLCALC 174 (H) 05/15/2016 1107      Wt Readings from Last 3 Encounters:  06/13/16 119 lb 3.2 oz (54.1 kg)  05/29/16 118 lb 6.4 oz (53.7 kg)  05/15/16 117 lb 3.2 oz (53.2 kg)      ASSESSMENT AND PLAN:  # Shortness of breath: # Elevated coronary calcium score: Katrina Walters has exertional symptoms that could be a sign of ischemia. This is especially concerning given her known elevated coronary calcium score. We will obtain an exercise treadmill test to evaluate for ischemia. She will start aspirin 81 mg daily. I recommended that  she start rosuvastatin as already prescribed.   # Hyperlipidemia: Start crestor as above.   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 She is not interested in wearing the monitor at this time. Thyroid function, compensated metabolic panel, and CBC were unremarkable 05/2016.   Current medicines are reviewed at length with the patient today.  The patient does not have concerns regarding medicines.  The following changes have been made:  no change  Labs/ tests ordered today include:   Orders Placed This  Encounter  Procedures  . Exercise Tolerance Test     Disposition:   FU with Hillery Bhalla C. Oval Linsey, MD, [facility] in 1 year    This note was written with the assistance of speech recognition software.  Please excuse any transcriptional errors.  Signed, Jannelle Notaro C. Oval Linsey, MD, [facility]  06/13/2016 5:23 PM    [place]

## 2016-06-25 ENCOUNTER — Telehealth (HOSPITAL_COMMUNITY): Payer: Self-pay

## 2016-06-25 NOTE — Telephone Encounter (Signed)
Encounter complete. 

## 2016-06-27 ENCOUNTER — Ambulatory Visit (HOSPITAL_COMMUNITY)
Admission: RE | Admit: 2016-06-27 | Discharge: 2016-06-27 | Disposition: A | Discharge status: Home / Self Care | Payer: BLUE CROSS/BLUE SHIELD | Source: Ambulatory Visit | Attending: Cardiovascular Disease | Admitting: Cardiovascular Disease

## 2016-06-27 ENCOUNTER — Inpatient Hospital Stay (HOSPITAL_COMMUNITY): Admission: RE | Admit: 2016-06-27 | Payer: BLUE CROSS/BLUE SHIELD | Source: Ambulatory Visit

## 2016-06-27 DIAGNOSIS — R9439 Abnormal result of other cardiovascular function study: Secondary | ICD-10-CM | POA: Diagnosis not present

## 2016-06-27 DIAGNOSIS — R079 Chest pain, unspecified: Secondary | ICD-10-CM

## 2016-06-27 DIAGNOSIS — R0602 Shortness of breath: Secondary | ICD-10-CM

## 2016-06-27 LAB — EXERCISE TOLERANCE TEST
Estimated workload: 12.3 METS
Exercise duration (min): 10 min
Exercise duration (sec): 23 s
MPHR: 161 {beats}/min
Peak HR: 142 {beats}/min
Percent HR: 88 %
RPE: 17
Rest HR: 75 {beats}/min

## 2016-06-29 ENCOUNTER — Other Ambulatory Visit: Payer: Self-pay | Admitting: Cardiovascular Disease

## 2016-06-29 ENCOUNTER — Encounter: Payer: Self-pay | Admitting: Cardiovascular Disease

## 2016-07-01 ENCOUNTER — Telehealth: Payer: Self-pay | Admitting: *Deleted

## 2016-07-01 NOTE — Telephone Encounter (Deleted)
-----   Message from Skeet Latch, MD sent at 06/29/2016 11:29 PM EDT ----- Stress test was abnormal.  Please set up for cath.

## 2016-07-01 NOTE — Telephone Encounter (Signed)
Left message to call back  

## 2016-07-01 NOTE — Telephone Encounter (Signed)
-----   Message from Skeet Latch, MD sent at 06/29/2016 11:29 PM EDT ----- Stress test was abnormal.  Please set up for cath.

## 2016-07-07 ENCOUNTER — Telehealth: Payer: Self-pay | Admitting: Cardiovascular Disease

## 2016-07-07 DIAGNOSIS — Z01818 Encounter for other preprocedural examination: Secondary | ICD-10-CM

## 2016-07-07 DIAGNOSIS — R9439 Abnormal result of other cardiovascular function study: Secondary | ICD-10-CM

## 2016-07-07 NOTE — Telephone Encounter (Signed)
F/u Message ° °Pt returning RN call. Please call back to discuss  °

## 2016-07-07 NOTE — Telephone Encounter (Signed)
New message   Patient calling back regarding nuclear stress test results.

## 2016-07-07 NOTE — Telephone Encounter (Signed)
Called Cath lab. Patient scheduled for left heart cath on 07/15/16 at 0730 and to arrive at short stay at 0530.  Patient notified and given information on date and arrival time, to get lab work this week, and to be NPO after midnight, bring belongings, list of meds, insurance cards, pack a light bag, and have a driver. She verbalized understanding.

## 2016-07-07 NOTE — Telephone Encounter (Signed)
Spoke with patient and she stated she would like to have a more definitive and less invasive study before proceeding with the cardiac cath She has scheduled a [facility] screening for Friday 07-11-16 but would be willing to do something different if Dr Oval Linsey thought would be more appropriate Advised would forward to Dr Oval Linsey for recommendations Patient would like cath cancelled for now until further testing

## 2016-07-07 NOTE — Telephone Encounter (Signed)
Returned call to patient. Gave her the results and recommendation by Dr Oval Linsey to have a heart cath. She verbalized understanding. Patient instructed to get blood work within 14 days of the procedure once we have it scheduled. Will call patient back with time and date of heart cath.

## 2016-07-09 NOTE — Telephone Encounter (Signed)
Pt says she is still waiting to hear what the next step is going to be.

## 2016-07-09 NOTE — Telephone Encounter (Signed)
There is no less invasive test that I would recommend.  She has coronary disease based on her CT scan.  Her stress test is abnormal.  The only other test that makes sense and that will address this problem is a heart catheterization.

## 2016-07-09 NOTE — Telephone Encounter (Signed)
Spoke with patient and advised waiting on Dr Blenda Mounts recommendations

## 2016-07-11 ENCOUNTER — Other Ambulatory Visit: Payer: Self-pay | Admitting: *Deleted

## 2016-07-11 DIAGNOSIS — Z01818 Encounter for other preprocedural examination: Secondary | ICD-10-CM

## 2016-07-11 NOTE — Addendum Note (Signed)
Addended by: Alvina Filbert B on: 07/11/2016 04:30 PM   Modules accepted: Miquel Dunn

## 2016-07-11 NOTE — Telephone Encounter (Signed)
Cardiac cath scheduled for 07-17-16 with Dr Angelena Form arrive at 8:30 for 10:30 Patient aware of date and time, will get labs on Monday 11-6

## 2016-07-12 NOTE — Telephone Encounter (Signed)
Filled out cva alendronate 90 day request form. Placed on your desk to fax.

## 2016-07-14 ENCOUNTER — Ambulatory Visit: Payer: BLUE CROSS/BLUE SHIELD | Admitting: Cardiovascular Disease

## 2016-07-14 LAB — BASIC METABOLIC PANEL
BUN: 18 mg/dL (ref 7–25)
CO2: 30 mmol/L (ref 20–31)
Calcium: 9.9 mg/dL (ref 8.6–10.4)
Chloride: 102 mmol/L (ref 98–110)
Creat: 0.84 mg/dL (ref 0.50–1.05)
Glucose, Bld: 85 mg/dL (ref 65–99)
Potassium: 5 mmol/L (ref 3.5–5.3)
Sodium: 139 mmol/L (ref 135–146)

## 2016-07-14 LAB — CBC WITH DIFFERENTIAL/PLATELET
Basophils Absolute: 0 cells/uL (ref 0–200)
Basophils Relative: 0 %
Eosinophils Absolute: 245 cells/uL (ref 15–500)
Eosinophils Relative: 5 %
HCT: 44.4 % (ref 35.0–45.0)
Hemoglobin: 14.8 g/dL (ref 11.7–15.5)
Lymphocytes Relative: 26 %
Lymphs Abs: 1274 cells/uL (ref 850–3900)
MCH: 29.1 pg (ref 27.0–33.0)
MCHC: 33.3 g/dL (ref 32.0–36.0)
MCV: 87.4 fL (ref 80.0–100.0)
MPV: 10.2 fL (ref 7.5–12.5)
Monocytes Absolute: 490 cells/uL (ref 200–950)
Monocytes Relative: 10 %
Neutro Abs: 2891 cells/uL (ref 1500–7800)
Neutrophils Relative %: 59 %
Platelets: 232 10*3/uL (ref 140–400)
RBC: 5.08 MIL/uL (ref 3.80–5.10)
RDW: 16.4 % — ABNORMAL HIGH (ref 11.0–15.0)
WBC: 4.9 10*3/uL (ref 3.8–10.8)

## 2016-07-15 ENCOUNTER — Encounter: Payer: Self-pay | Admitting: Cardiovascular Disease

## 2016-07-15 ENCOUNTER — Encounter (HOSPITAL_COMMUNITY): Payer: Self-pay

## 2016-07-15 ENCOUNTER — Ambulatory Visit (HOSPITAL_COMMUNITY): Admit: 2016-07-15 | Payer: BLUE CROSS/BLUE SHIELD | Admitting: Cardiology

## 2016-07-15 LAB — PROTIME-INR
INR: 1.1
Prothrombin Time: 11.8 s — ABNORMAL HIGH (ref 9.0–11.5)

## 2016-07-15 SURGERY — LEFT HEART CATH AND CORONARY ANGIOGRAPHY

## 2016-07-17 ENCOUNTER — Encounter (HOSPITAL_COMMUNITY): Payer: Self-pay | Admitting: Cardiovascular Disease

## 2016-07-17 ENCOUNTER — Encounter (HOSPITAL_COMMUNITY)
Admission: RE | Disposition: A | Discharge status: Home / Self Care | Payer: Self-pay | Source: Ambulatory Visit | Attending: Cardiovascular Disease

## 2016-07-17 ENCOUNTER — Ambulatory Visit (HOSPITAL_COMMUNITY)
Admission: RE | Admit: 2016-07-17 | Discharge: 2016-07-17 | Disposition: A | Discharge status: Home / Self Care | Payer: BLUE CROSS/BLUE SHIELD | Source: Ambulatory Visit | Attending: Cardiovascular Disease | Admitting: Cardiovascular Disease

## 2016-07-17 DIAGNOSIS — I200 Unstable angina: Secondary | ICD-10-CM | POA: Diagnosis present

## 2016-07-17 DIAGNOSIS — I2582 Chronic total occlusion of coronary artery: Secondary | ICD-10-CM | POA: Diagnosis not present

## 2016-07-17 DIAGNOSIS — I25110 Atherosclerotic heart disease of native coronary artery with unstable angina pectoris: Secondary | ICD-10-CM | POA: Insufficient documentation

## 2016-07-17 DIAGNOSIS — Z79899 Other long term (current) drug therapy: Secondary | ICD-10-CM | POA: Insufficient documentation

## 2016-07-17 DIAGNOSIS — E785 Hyperlipidemia, unspecified: Secondary | ICD-10-CM | POA: Insufficient documentation

## 2016-07-17 DIAGNOSIS — I25119 Atherosclerotic heart disease of native coronary artery with unspecified angina pectoris: Secondary | ICD-10-CM | POA: Diagnosis not present

## 2016-07-17 DIAGNOSIS — I2510 Atherosclerotic heart disease of native coronary artery without angina pectoris: Secondary | ICD-10-CM

## 2016-07-17 DIAGNOSIS — Z01818 Encounter for other preprocedural examination: Secondary | ICD-10-CM

## 2016-07-17 DIAGNOSIS — Z8619 Personal history of other infectious and parasitic diseases: Secondary | ICD-10-CM | POA: Insufficient documentation

## 2016-07-17 DIAGNOSIS — Z7982 Long term (current) use of aspirin: Secondary | ICD-10-CM | POA: Diagnosis not present

## 2016-07-17 DIAGNOSIS — Z8249 Family history of ischemic heart disease and other diseases of the circulatory system: Secondary | ICD-10-CM | POA: Insufficient documentation

## 2016-07-17 HISTORY — PX: CARDIAC CATHETERIZATION: SHX172

## 2016-07-17 SURGERY — LEFT HEART CATH AND CORONARY ANGIOGRAPHY
Anesthesia: LOCAL

## 2016-07-17 MED ORDER — ASPIRIN 81 MG PO CHEW
CHEWABLE_TABLET | ORAL | Status: AC
  Administered 2016-07-17: 81 mg via ORAL
  Filled 2016-07-17: qty 1

## 2016-07-17 MED ORDER — SODIUM CHLORIDE 0.9% FLUSH: 3.0000 mL | Freq: Two times a day (BID) | INTRAVENOUS | Status: DC

## 2016-07-17 MED ORDER — ACETAMINOPHEN 325 MG PO TABS
650.0000 mg | ORAL_TABLET | Freq: Once | ORAL | Status: AC
  Administered 2016-07-17: 650 mg via ORAL

## 2016-07-17 MED ORDER — SODIUM CHLORIDE 0.9% FLUSH: 3.0000 mL | INTRAVENOUS | Status: DC | PRN

## 2016-07-17 MED ORDER — HEPARIN (PORCINE) IN NACL 2-0.9 UNIT/ML-% IJ SOLN
INTRAMUSCULAR | Status: AC
  Filled 2016-07-17: qty 1500

## 2016-07-17 MED ORDER — SODIUM CHLORIDE 0.9 % IV SOLN: 250.0000 mL | INTRAVENOUS | Status: DC | PRN

## 2016-07-17 MED ORDER — DIAZEPAM 5 MG PO TABS
5.0000 mg | ORAL_TABLET | Freq: Once | ORAL | Status: AC
  Administered 2016-07-17: 5 mg via ORAL

## 2016-07-17 MED ORDER — LIDOCAINE HCL (PF) 1 % IJ SOLN
INTRAMUSCULAR | Status: DC | PRN
  Administered 2016-07-17: 2 mL

## 2016-07-17 MED ORDER — LIDOCAINE HCL (PF) 1 % IJ SOLN
INTRAMUSCULAR | Status: AC
  Filled 2016-07-17: qty 30

## 2016-07-17 MED ORDER — MIDAZOLAM HCL 2 MG/2ML IJ SOLN
INTRAMUSCULAR | Status: DC | PRN
  Administered 2016-07-17: 1 mg via INTRAVENOUS

## 2016-07-17 MED ORDER — HEPARIN (PORCINE) IN NACL 2-0.9 UNIT/ML-% IJ SOLN
INTRAMUSCULAR | Status: DC | PRN
  Administered 2016-07-17: 1500 mL

## 2016-07-17 MED ORDER — DIAZEPAM 5 MG PO TABS
ORAL_TABLET | ORAL | Status: AC
  Administered 2016-07-17: 5 mg via ORAL
  Filled 2016-07-17: qty 1

## 2016-07-17 MED ORDER — HEPARIN SODIUM (PORCINE) 1000 UNIT/ML IJ SOLN
INTRAMUSCULAR | Status: DC | PRN
  Administered 2016-07-17: 3000 [IU] via INTRAVENOUS

## 2016-07-17 MED ORDER — SODIUM CHLORIDE 0.9 % IV SOLN: INTRAVENOUS | Status: AC

## 2016-07-17 MED ORDER — MIDAZOLAM HCL 2 MG/2ML IJ SOLN
INTRAMUSCULAR | Status: AC
  Filled 2016-07-17: qty 2

## 2016-07-17 MED ORDER — IOPAMIDOL (ISOVUE-370) INJECTION 76%
INTRAVENOUS | Status: AC
  Filled 2016-07-17: qty 100

## 2016-07-17 MED ORDER — FENTANYL CITRATE (PF) 100 MCG/2ML IJ SOLN
INTRAMUSCULAR | Status: DC | PRN
  Administered 2016-07-17: 25 ug via INTRAVENOUS

## 2016-07-17 MED ORDER — VERAPAMIL HCL 2.5 MG/ML IV SOLN
INTRAVENOUS | Status: AC
  Filled 2016-07-17: qty 2

## 2016-07-17 MED ORDER — IOPAMIDOL (ISOVUE-370) INJECTION 76%
INTRAVENOUS | Status: DC | PRN
  Administered 2016-07-17: 105 mL via INTRA_ARTERIAL

## 2016-07-17 MED ORDER — HEPARIN SODIUM (PORCINE) 1000 UNIT/ML IJ SOLN
INTRAMUSCULAR | Status: AC
  Filled 2016-07-17: qty 1

## 2016-07-17 MED ORDER — SODIUM CHLORIDE 0.9 % IV SOLN
INTRAVENOUS | Status: DC
  Administered 2016-07-17: 09:00:00 via INTRAVENOUS

## 2016-07-17 MED ORDER — ACETAMINOPHEN 325 MG PO TABS
ORAL_TABLET | ORAL | Status: AC
  Administered 2016-07-17: 650 mg via ORAL
  Filled 2016-07-17: qty 2

## 2016-07-17 MED ORDER — ASPIRIN 81 MG PO CHEW
81.0000 mg | CHEWABLE_TABLET | Freq: Once | ORAL | Status: AC
  Administered 2016-07-17: 81 mg via ORAL

## 2016-07-17 MED ORDER — VERAPAMIL HCL 2.5 MG/ML IV SOLN
INTRAVENOUS | Status: DC | PRN
  Administered 2016-07-17: 10 mL via INTRA_ARTERIAL

## 2016-07-17 MED ORDER — FENTANYL CITRATE (PF) 100 MCG/2ML IJ SOLN
INTRAMUSCULAR | Status: AC
  Filled 2016-07-17: qty 2

## 2016-07-17 SURGICAL SUPPLY — 11 items
CATH INFINITI 5 FR JL3.5 (CATHETERS) ×2 IMPLANT
CATH INFINITI 5FR ANG PIGTAIL (CATHETERS) ×2 IMPLANT
CATH INFINITI JR4 5F (CATHETERS) ×2 IMPLANT
GLIDESHEATH SLEND SS 6F .021 (SHEATH) ×2 IMPLANT
GUIDEWIRE INQWIRE 1.5J.035X260 (WIRE) ×1 IMPLANT
INQWIRE 1.5J .035X260CM (WIRE) ×2
KIT HEART LEFT (KITS) ×2 IMPLANT
PACK CARDIAC CATHETERIZATION (CUSTOM PROCEDURE TRAY) ×2 IMPLANT
SYR MEDRAD MARK V 150ML (SYRINGE) ×2 IMPLANT
TRANSDUCER W/STOPCOCK (MISCELLANEOUS) ×2 IMPLANT
TUBING CIL FLEX 10 FLL-RA (TUBING) ×2 IMPLANT

## 2016-07-17 NOTE — Discharge Instructions (Signed)
Radial Site Care °Refer to this sheet in the next few weeks. These instructions provide you with information about caring for yourself after your procedure. Your health care provider may also give you more specific instructions. Your treatment has been planned according to current medical practices, but problems sometimes occur. Call your health care provider if you have any problems or questions after your procedure. °WHAT TO EXPECT AFTER THE PROCEDURE °After your procedure, it is typical to have the following: °· Bruising at the radial site that usually fades within 1-2 weeks. °· Blood collecting in the tissue (hematoma) that may be painful to the touch. It should usually decrease in size and tenderness within 1-2 weeks. °HOME CARE INSTRUCTIONS °· Take medicines only as directed by your health care provider. °· You may shower 24-48 hours after the procedure or as directed by your health care provider. Remove the bandage (dressing) and gently wash the site with plain soap and water. Pat the area dry with a clean towel. Do not rub the site, because this may cause bleeding. °· Do not take baths, swim, or use a hot tub until your health care provider approves. °· Check your insertion site every day for redness, swelling, or drainage. °· Do not apply powder or lotion to the site. °· Do not flex or bend the affected arm for 24 hours or as directed by your health care provider. °· Do not push or pull heavy objects with the affected arm for 24 hours or as directed by your health care provider. °· Do not lift over 10 lb (4.5 kg) for 5 days after your procedure or as directed by your health care provider. °· Ask your health care provider when it is okay to: °¨ Return to work or school. °¨ Resume usual physical activities or sports. °¨ Resume sexual activity. °· Do not drive home if you are discharged the same day as the procedure. Have someone else drive you. °· You may drive 24 hours after the procedure unless otherwise  instructed by your health care provider. °· Do not operate machinery or power tools for 24 hours after the procedure. °· If your procedure was done as an outpatient procedure, which means that you went home the same day as your procedure, a responsible adult should be with you for the first 24 hours after you arrive home. °· Keep all follow-up visits as directed by your health care provider. This is important. °SEEK MEDICAL CARE IF: °· You have a fever. °· You have chills. °· You have increased bleeding from the radial site. Hold pressure on the site. CALL 911 °SEEK IMMEDIATE MEDICAL CARE IF: °· You have unusual pain at the radial site. °· You have redness, warmth, or swelling at the radial site. °· You have drainage (other than a small amount of blood on the dressing) from the radial site. °· The radial site is bleeding, and the bleeding does not stop after 30 minutes of holding steady pressure on the site. °· Your arm or hand becomes pale, cool, tingly, or numb. °  °This information is not intended to replace advice given to you by your health care provider. Make sure you discuss any questions you have with your health care provider. °  °Document Released: 09/27/2010 Document Revised: 09/15/2014 Document Reviewed: 03/13/2014 °Elsevier Interactive Patient Education ©2016 [employer] ° °

## 2016-07-17 NOTE — H&P (Signed)
Patient ID: Katrina Walters MRN: [id number] DOB/AGE: 1955-12-02 60 y.o. Admit date: 07/17/2016  Primary Care Physician: Mackie Pai, PA-C Primary Cardiologist: Oval Linsey  HPI: 60 yo female with history of HLD, FH of CAD with high calcium score on CT and abnormal stress test who is here today for cardiac cath. Recent dyspnea concerning for unstable angina.   Review of systems complete and found to be negative unless listed above   Past Medical History:  Diagnosis Date  . Eating disorder   . Genital warts   . Hyperlipidemia   . 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 Heart disease Mother     died at 61 of heart attack  . Hypertension Mother   . Heart attack Mother   . Heart disease Father     had heart attack at age 31  . Heart attack Father   . Cancer Paternal Aunt     Social History   Social History  . Marital status: Married    Spouse name: N/A  . Number of children: N/A  . Years of education: N/A   Occupational History  . unemployed at the time    Social History Main Topics  . Smoking status: Never Smoker  . Smokeless tobacco: Never Used  . Alcohol use No  . Drug use: No  . Sexual activity: Not on file   Other Topics Concern  . Not on file   Social History Narrative  . No narrative on file    Past Surgical History:  Procedure Laterality Date  . BREAST REDUCTION SURGERY  2006    Allergies  Allergen Reactions  . Amoxicillin     Had fever with flu like symptoms. Has patient had a PCN reaction causing immediate rash, facial/tongue/throat swelling, SOB or lightheadedness with hypotension: no Has patient had a PCN reaction causing severe rash involving mucus membranes or skin necrosis: no Has patient had a PCN reaction that required hospitalization no Has patient had a PCN reaction occurring within the last 10 years: yes If all of the above answers are "NO", then may proceed with Cephalosporin use.    Prior  to Admission Meds:  Prior to Admission medications   Medication Sig Start Date End Date Taking? Authorizing Provider  albuterol (PROVENTIL HFA;VENTOLIN HFA) 108 (90 Base) MCG/ACT inhaler Inhale 2 puffs into the lungs every 6 (six) hours as needed for wheezing or shortness of breath. 05/25/16  Yes Edward Saguier, PA-C  aspirin EC 81 MG tablet Take 1 tablet (81 mg total) by mouth daily. 06/13/16  Yes Skeet Latch, MD  COLCHICINE PO Take 1 tablet by mouth daily. Take 1mg  daily   Yes Historical Provider, MD  Melatonin 3 MG CAPS Take 1 capsule by mouth at bedtime.    Yes Historical Provider, MD  Misc Natural Products (COMPLETE MENOPAUSE HEALTH PO) Take 1 Dose by mouth daily.    Yes Historical Provider, MD  Multiple Vitamin (MULTIVITAMIN) tablet Take 1 tablet by mouth daily.   Yes Historical Provider, MD  Polyethyl Glycol-Propyl Glycol (SYSTANE OP) Place 1 drop into both eyes daily as needed. Dry eyes   Yes Historical Provider, MD  PREMARIN vaginal cream Place 0.5 g vaginally 2 (two) times a week. 05/22/16  Yes Historical Provider, MD  rosuvastatin (CRESTOR) 10 MG tablet Take 1 tablet (10 mg total) by mouth daily. Please educate on potential colcrys and crestor interaction. 05/29/16  Yes Edward Saguier, PA-C  TURMERIC PO  Take 1 tablet by mouth daily.   Yes Historical Provider, MD  Vitamin D, Ergocalciferol, (DRISDOL) 50000 units CAPS capsule Take 1 capsule (50,000 Units total) by mouth every 7 (seven) days. Patient taking differently: Take 50,000 Units by mouth every 7 (seven) days. Fridays 05/22/16  Yes Edward Saguier, PA-C  alendronate (FOSAMAX) 10 MG tablet Take 1 tablet (10 mg total) by mouth daily before breakfast. Take with a full glass of water on an empty stomach. 05/18/16   Mackie Pai, PA-C    Physical Exam: Blood pressure 120/70, pulse 82, temperature 98.9 F (37.2 C), resp. rate 16, height 5' 3.5" (1.613 m), weight 115 lb (52.2 kg), SpO2 100 %.    General: Well developed, well nourished,  NAD  HEENT: OP clear, mucus membranes moist  SKIN: warm, dry. No rashes.  Neuro: No focal deficits  Musculoskeletal: Muscle strength 5/5 all ext  Psychiatric: Mood and affect normal  Neck: No JVD, no carotid bruits, no thyromegaly, no lymphadenopathy.  Lungs:Clear bilaterally, no wheezes, rhonci, crackles  Cardiovascular: Regular rate and rhythm. No murmurs, gallops or rubs.  Abdomen:Soft. Bowel sounds present. Non-tender.  Extremities: No lower extremity edema. Pulses are 2 + in the bilateral DP/PT.   Labs:   Lab Results  Component Value Date   WBC 4.9 07/14/2016   HGB 14.8 07/14/2016   HCT 44.4 07/14/2016   MCV 87.4 07/14/2016   PLT 232 07/14/2016    Recent Labs Lab 07/14/16 0914  NA 139  K 5.0  CL 102  CO2 30  BUN 18  CREATININE 0.84  CALCIUM 9.9  GLUCOSE 85   ASSESSMENT AND PLAN:   1. Unstable angina: Plan cardiac cath today with possible PCI.   Darlina Guys, MD 07/17/2016, 9:43 AM

## 2016-07-17 NOTE — Interval H&P Note (Signed)
History and Physical Interval Note:  07/17/2016 10:06 AM  Katrina Walters  has presented today for cardiac cath with the diagnosis of abnormal treadmill stress test/unstable angina. The various methods of treatment have been discussed with the patient and family. After consideration of risks, benefits and other options for treatment, the patient has consented to  Procedure(s): Left Heart Cath and Coronary Angiography (N/A) as a surgical intervention .  The patient's history has been reviewed, patient examined, no change in status, stable for surgery.  I have reviewed the patient's chart and labs.  Questions were answered to the patient's satisfaction.    Cath Lab Visit (complete for each Cath Lab visit)  Clinical Evaluation Leading to the Procedure:   ACS: No.  Non-ACS:    Anginal Classification: CCS II  Anti-ischemic medical therapy: No Therapy  Non-Invasive Test Results: Intermediate-risk stress test findings: cardiac mortality 1-3%/year  Prior CABG: No previous CABG         Lauree Chandler

## 2016-07-20 NOTE — Progress Notes (Signed)
Cardiology Office Note   Date:  07/21/2016   ID:  Katrina Walters, DOB Nov 11, 1955, MRN [id number]  PCP:  Katrina Pai, PA-C  Cardiologist:   Katrina Latch, MD   Chief Complaint  Patient presents with  . Follow-up  . Shortness of Breath    when exerting self.       History of Present Illness: Katrina Walters is a 60 y.o. female with obstructive CAD, shortness of breath, and hyperlipidemia and who presents for follow up.  Katrina Walters was seen in clinic 06/13/16 with exertional shortness of breath.  She had a known coronary calcium score of 376 in 2015 (98% for age and gender).  She was referred for stress testing 11/2014 but was unable to complete it due to cost.  She was able to perform an ETT 06/27/16 that was notable for 32mm ST depressions inferiorly.  She was referred for [facility] 07/17/16 that revealed LVEF 65% with 100% distal RCA, 50% mid RCA, 40% ostial LM, 50% mild LAD, 70% D1 lesions.  Her symptoms were felt to be attributable to her chronically occluded RCA which had left to right collaterals.  Medical management was recommended with consideration for CABG if her symptoms persist.    Since her heart catheterization Katrina Walters has been feeling well.  She hasn't been exercising because she is afraid of whether her heart can tolerate it.  She took rosuvastatin for 3 weeks but stopped it due to myalgias.  She wonders why she has so much plaque in her arteries given that she eats such a healthy diet. She does note that sweets or her weakness. However, she notes that everyone in her family has hypercholesterol. Her son was noted to have high cholesterol when he went to his visit at age 72.  She denies any chest pain or shortness of breath but has not been exerting herself. She also denies lower extremity edema, orthopnea, or PND.  She notes some soreness in her wrist since the heart catheterization that is improving daily.    Past Medical History:  Diagnosis Date  . Eating disorder   . Genital  warts   . Hyperlipidemia   . 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 BREAST REDUCTION SURGERY  2006  . CARDIAC CATHETERIZATION N/A 07/17/2016   Procedure: Left Heart Cath and Coronary Angiography;  Surgeon: Burnell Blanks, MD;  Location: [facility];  Service: Cardiovascular;  Laterality: N/A;     Current Outpatient Prescriptions  Medication Sig Dispense Refill  . albuterol (PROVENTIL HFA;VENTOLIN HFA) 108 (90 Base) MCG/ACT inhaler Inhale 2 puffs into the lungs every 6 (six) hours as needed for wheezing or shortness of breath. 1 Inhaler 0  . alendronate (FOSAMAX) 10 MG tablet Take 1 tablet (10 mg total) by mouth daily before breakfast. Take with a full glass of water on an empty stomach. 30 tablet 5  . aspirin EC 81 MG tablet Take 1 tablet (81 mg total) by mouth daily.    . COLCHICINE PO Take 1 tablet by mouth daily. Take 1mg  daily    . Melatonin 3 MG CAPS Take 1 capsule by mouth at bedtime.     . Misc Natural Products (COMPLETE MENOPAUSE HEALTH PO) Take 1 Dose by mouth daily.     . Multiple Vitamin (MULTIVITAMIN) tablet Take 1 tablet by mouth daily.    Katrina Walters Glycol-Propyl Glycol (SYSTANE OP) Place 1 drop into both eyes daily as needed. Dry  eyes    . PREMARIN vaginal cream Place 0.5 g vaginally 2 (two) times a week.  4  . TURMERIC PO Take 1 tablet by mouth daily.    . Vitamin D, Ergocalciferol, (DRISDOL) 50000 units CAPS capsule Take 1 capsule (50,000 Units total) by mouth every 7 (seven) days. (Patient taking differently: Take 50,000 Units by mouth every 7 (seven) days. Fridays) 6 capsule 0  . atorvastatin (LIPITOR) 20 MG tablet Take 1 tablet (20 mg total) by mouth daily. 30 tablet 5  . metoprolol succinate (TOPROL XL) 25 MG 24 hr tablet Take 1 tablet (25 mg total) by mouth daily. 30 tablet 5   No current facility-administered medications for this visit.     Allergies:   Amoxicillin and Crestor [rosuvastatin calcium]      Social History:  The patient  reports that she has never smoked. She has never used smokeless tobacco. She reports that she does not drink alcohol or use drugs.   Family History:  The patient's family history includes Cancer in her paternal aunt; Heart attack in her father and mother; Heart disease in her father and mother; Hypertension in her mother.    ROS:  Please see the history of present illness.   Otherwise, review of systems are positive for none.   All other systems are reviewed and negative.    PHYSICAL EXAM: VS:  BP 134/73   Pulse 79   Ht 5\' 3"  (1.6 m)   Wt 53.1 kg (117 lb)   BMI 20.73 kg/m  , BMI Body mass index is 20.73 kg/m. GENERAL:  Well appearing HEENT:  Pupils equal round and reactive, fundi not visualized, oral mucosa unremarkable NECK:  No jugular venous distention, waveform within normal limits, carotid upstroke brisk and symmetric, no bruits LYMPHATICS:  No cervical adenopathy LUNGS:  Clear to auscultation bilaterally HEART:  RRR.  PMI not displaced or sustained,S1 and S2 within normal limits, no S3, no S4, no clicks, no rubs, no murmurs ABD:  Flat, positive bowel sounds normal in frequency in pitch, no bruits, no rebound, no guarding, no midline pulsatile mass, no hepatomegaly, no splenomegaly EXT:  2 plus pulses throughout, no edema, no cyanosis no clubbing SKIN:  No rashes no nodules NEURO:  Cranial nerves II through XII grossly intact, motor grossly intact throughout PSYCH:  Cognitively intact, oriented to person place and time   EKG:  EKG is not ordered today. The ekg ordered 05/15/16 demonstrates sinus bradycardia. Rate 59 bpm.  ETT 06/27/16:  Blood pressure demonstrated a normal response to exercise.  No T wave inversion was noted during stress.  Horizontal ST segment depression ST segment depression of 2 mm was noted during stress in the II, III and aVF leads, and returning to baseline after less than 1 minute of recovery.  LHC 07/17/16:  The  left ventricular systolic function is normal.  LV end diastolic pressure is normal.  The left ventricular ejection fraction is greater than 65% by visual estimate.  There is no mitral valve regurgitation.  Prox RCA lesion, 20 %stenosed.  Mid RCA lesion, 50 %stenosed.  Dist RCA lesion, 100 %stenosed.  Ost LM lesion, 40 %stenosed.  Ost LM to LM lesion, 40 %stenosed.  Mid LAD lesion, 50 %stenosed.  1st Diag lesion, 70 %stenosed.  Ost 1st Mrg to 1st Mrg lesion, 40 %stenosed.  Ost LAD to Prox LAD lesion, 20 %stenosed.  Prox RCA to Mid RCA lesion, 20 %stenosed.   1. Triple vessel CAD  2. Mild to  moderate stenosis left main artery. This appears to be eccentric and does not appear to be flow limiting.  3. Moderate stenosis mid LAD. The small caliber diagonal branch has moderately severe stenosis.  4. Mild to moderate stenosis in the obtuse marginal branch of the Circumflex 5. The RCA is a large dominant vessel with moderate distal stenosis prior to total occlusion of the vessel at the distal bifurcation. The posterolateral artery and the posterior descending artery fills from left to right collaterals.  6. Normal LV systolic function  Recommendations: Her abnormal stress test is likely due to the chronic occlusion of the distal RCA. She has mild to moderate stenosis in the left main artery as well as moderate stenosis in the LAD. I think the best plan for now is medical management of her CAD. If she fails medical management, will have to consider CABG.    Recent Labs: 05/15/2016: ALT 15; TSH 2.99 07/14/2016: BUN 18; Creat 0.84; Hemoglobin 14.8; Platelets 232; Potassium 5.0; Sodium 139   Lipid Panel    Component Value Date/Time   CHOL 240 (H) 05/15/2016 1107   TRIG 138.0 05/15/2016 1107   HDL 38.30 (L) 05/15/2016 1107   CHOLHDL 6 05/15/2016 1107   VLDL 27.6 05/15/2016 1107   LDLCALC 174 (H) 05/15/2016 1107      Wt Readings from Last 3 Encounters:  07/21/16 53.1 kg (117 lb)   07/17/16 52.2 kg (115 lb)  06/13/16 54.1 kg (119 lb 3.2 oz)      ASSESSMENT AND PLAN:  # 3 vessel CAD: # Shortness of breath: # Hyperlipidemia: Ms. Mccleland has 3 vessel CAD that is being medically managed.  She was started on aspirin.  We will add metoprolol succinate 25mg  daily.  She was able to tolerate rosuvastatin due to myalgias. We will try atorvastatin. If this also causes myalgias we will refer her to lipid clinic for consideration of a PCSK9 inhibitor.  I doubt that she will be able to tolerate high-dose statins due to her required use of colchicine.  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  # Palpitations: Symptoms are very sporadic and not very problematic for her. She is not interested in wearing the monitor at this time. Thyroid function, compensated metabolic panel, and CBC were unremarkable 05/2016.   Current medicines are reviewed at length with the patient today.  The patient does not have concerns regarding medicines.  The following changes have been made:  no change  Labs/ tests ordered today include:   No orders of the defined types were placed in this encounter.    Disposition:   FU with Annistyn Depass C. Oval Linsey, MD, [facility] in 1 month   This note was written with the assistance of speech recognition software.  Please excuse any transcriptional errors.  Signed, Taisa Deloria C. Oval Linsey, MD, [facility]  07/21/2016 10:42 AM    [facility]

## 2016-07-21 ENCOUNTER — Ambulatory Visit (INDEPENDENT_AMBULATORY_CARE_PROVIDER_SITE_OTHER): Payer: BLUE CROSS/BLUE SHIELD | Admitting: Cardiovascular Disease

## 2016-07-21 ENCOUNTER — Encounter: Payer: Self-pay | Admitting: Cardiovascular Disease

## 2016-07-21 VITALS — BP 134/73 | HR 79 | Ht 63.0 in | Wt 117.0 lb

## 2016-07-21 DIAGNOSIS — E7800 Pure hypercholesterolemia, unspecified: Secondary | ICD-10-CM | POA: Diagnosis not present

## 2016-07-21 DIAGNOSIS — A239 Brucellosis, unspecified: Secondary | ICD-10-CM | POA: Diagnosis not present

## 2016-07-21 DIAGNOSIS — I2510 Atherosclerotic heart disease of native coronary artery without angina pectoris: Secondary | ICD-10-CM | POA: Diagnosis not present

## 2016-07-21 MED ORDER — METOPROLOL SUCCINATE ER 25 MG PO TB24: 25.0000 mg | ORAL_TABLET | Freq: Every day | ORAL | 5 refills | Status: DC

## 2016-07-21 MED ORDER — ATORVASTATIN CALCIUM 20 MG PO TABS
20.0000 mg | ORAL_TABLET | Freq: Every day | ORAL | 5 refills | Status: DC
Start: 2016-07-21 — End: 2016-08-20

## 2016-07-21 NOTE — Patient Instructions (Signed)
Medication Instructions:  START METOPROLOL SUC 25 MG DAILY  START ATORVASTATIN 20 MG DAILY   Labwork: NONE  Testing/Procedures: NONE  Follow-Up: Your physician recommends that you schedule a follow-up appointment in: [place]  If you need a refill on your cardiac medications before your next appointment, please call your pharmacy.

## 2016-08-05 ENCOUNTER — Telehealth: Payer: Self-pay | Admitting: Medical

## 2016-08-05 MED ORDER — TRAZODONE HCL 50 MG PO TABS: 25.0000 mg | ORAL_TABLET | Freq: Every evening | ORAL | 0 refills | Status: DC | PRN

## 2016-08-05 NOTE — Telephone Encounter (Signed)
Sent the trazadone 90 tab rx to [facility].

## 2016-08-11 NOTE — Telephone Encounter (Signed)
This encounter was created in error - please disregard.

## 2016-08-20 ENCOUNTER — Encounter: Payer: Self-pay | Admitting: Cardiovascular Disease

## 2016-08-20 ENCOUNTER — Ambulatory Visit (INDEPENDENT_AMBULATORY_CARE_PROVIDER_SITE_OTHER): Payer: BLUE CROSS/BLUE SHIELD | Admitting: Cardiovascular Disease

## 2016-08-20 VITALS — BP 114/62 | HR 72 | Ht 64.0 in | Wt 115.6 lb

## 2016-08-20 DIAGNOSIS — I2510 Atherosclerotic heart disease of native coronary artery without angina pectoris: Secondary | ICD-10-CM

## 2016-08-20 DIAGNOSIS — E7800 Pure hypercholesterolemia, unspecified: Secondary | ICD-10-CM

## 2016-08-20 DIAGNOSIS — I119 Hypertensive heart disease without heart failure: Secondary | ICD-10-CM | POA: Diagnosis not present

## 2016-08-20 DIAGNOSIS — R002 Palpitations: Secondary | ICD-10-CM | POA: Diagnosis not present

## 2016-08-20 NOTE — Patient Instructions (Signed)
Medication Instructions:  Your physician recommends that you continue on your current medications as directed. Please refer to the Current Medication list given to you today.  Labwork: none  Testing/Procedures: none  Follow-Up: Your physician recommends that you schedule a follow-up appointment in: Pharm D in lipid clinic  Your physician recommends that you schedule a follow-up appointment in: 3 month ov   If you need a refill on your cardiac medications before your next appointment, please call your pharmacy.

## 2016-08-20 NOTE — Progress Notes (Signed)
Cardiology Office Note   Date:  08/20/2016   ID:  Katrina Walters, DOB 05/01/1956, MRN [id number]  PCP:  Mackie Pai, PA-C  Cardiologist:   Skeet Latch, MD   No chief complaint on file.    History of Present Illness: Katrina Walters is a 60 y.o. female with obstructive CAD (medically managed), shortness of breath, and hyperlipidemia and who presents for follow up.  Katrina Walters was seen in clinic 06/13/16 with exertional shortness of breath.  She had a known coronary calcium score of 376 in 2015 (98% for age and gender).  She was referred for stress testing 11/2014 but was unable to complete it due to cost.  She was able to perform an ETT 06/27/16 that was notable for 30mm ST depressions inferiorly.  She was referred for [facility] 07/17/16 that revealed LVEF 65% with 100% distal RCA, 50% mid RCA, 40% ostial LM, 50% mi d LAD, 70% D1 lesions.  Her symptoms were felt to be attributable to her chronically occluded RCA which had left to right collaterals.  Medical management was recommended with consideration for CABG if her symptoms persist.  She was started on rosuvastatin but did not tolerate due to myalgias. This was switched to atorvastatin.  However this was stopped due to a rash and swelling.   Since her last appointment Katrina Walters has been doing well.  She has started back exercising and denies chest pain.  She got mildly short of breath after exercising on the treadmill for 30 minutes.  She denies lower extremity edema, orthopnea or PND.  She feels tired by the afternoon but is otherwise well.   Past Medical History:  Diagnosis Date  . Eating disorder   . Genital warts   . Hyperlipidemia   . 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 BREAST REDUCTION SURGERY  2006  . CARDIAC CATHETERIZATION N/A 07/17/2016   Procedure: Left Heart Cath and Coronary Angiography;  Surgeon: Burnell Blanks, MD;  Location: [facility];  Service:  Cardiovascular;  Laterality: N/A;     Current Outpatient Prescriptions  Medication Sig Dispense Refill  . alendronate (FOSAMAX) 10 MG tablet Take 1 tablet (10 mg total) by mouth daily before breakfast. Take with a full glass of water on an empty stomach. 30 tablet 5  . aspirin EC 81 MG tablet Take 1 tablet (81 mg total) by mouth daily.    . COLCHICINE PO Take 1 tablet by mouth daily. Take 1mg  daily    . Melatonin 3 MG CAPS Take 1 capsule by mouth at bedtime.     . metoprolol succinate (TOPROL XL) 25 MG 24 hr tablet Take 1 tablet (25 mg total) by mouth daily. 30 tablet 5  . Misc Natural Products (COMPLETE MENOPAUSE HEALTH PO) Take 1 Dose by mouth daily.     . Multiple Vitamin (MULTIVITAMIN) tablet Take 1 tablet by mouth daily.    Vladimir Faster Glycol-Propyl Glycol (SYSTANE OP) Place 1 drop into both eyes daily as needed. Dry eyes    . PREMARIN vaginal cream Place 0.5 g vaginally 2 (two) times a week.  4  . TURMERIC PO Take 1 tablet by mouth daily.    . Vitamin D, Ergocalciferol, (DRISDOL) 50000 units CAPS capsule Take 1 capsule (50,000 Units total) by mouth every 7 (seven) days. (Patient taking differently: Take 50,000 Units by mouth every 7 (seven) days. Fridays) 6 capsule 0   No current facility-administered medications for this  visit.     Allergies:   Amoxicillin; Atorvastatin; and Crestor [rosuvastatin calcium]    Social History:  The patient  reports that she has never smoked. She has never used smokeless tobacco. She reports that she does not drink alcohol or use drugs.   Family History:  The patient's family history includes Cancer in her paternal aunt; Heart attack in her father and mother; Heart disease in her father and mother; Hypertension in her mother.    ROS:  Please see the history of present illness.   Otherwise, review of systems are positive for none.   All other systems are reviewed and negative.    PHYSICAL EXAM: VS:  BP 114/62   Pulse 72   Ht 5\' 4"  (1.626 m)   Wt  52.4 kg (115 lb 9.6 oz)   LMP  (LMP Unknown)   BMI 19.84 kg/m  , BMI Body mass index is 19.84 kg/m. GENERAL:  Well appearing HEENT:  Pupils equal round and reactive, fundi not visualized, oral mucosa unremarkable NECK:  No jugular venous distention, waveform within normal limits, carotid upstroke brisk and symmetric, no bruits LYMPHATICS:  No cervical adenopathy LUNGS:  Clear to auscultation bilaterally HEART:  RRR.  PMI not displaced or sustained,S1 and S2 within normal limits, no S3, no S4, no clicks, no rubs, no murmurs ABD:  Flat, positive bowel sounds normal in frequency in pitch, no bruits, no rebound, no guarding, no midline pulsatile mass, no hepatomegaly, no splenomegaly EXT:  2 plus pulses throughout, no edema, no cyanosis no clubbing SKIN:  No rashes no nodules NEURO:  Cranial nerves II through XII grossly intact, motor grossly intact throughout PSYCH:  Cognitively intact, oriented to person place and time   EKG:  EKG is not ordered today. The ekg ordered 05/15/16 demonstrates sinus bradycardia. Rate 59 bpm.  ETT 06/27/16:  Blood pressure demonstrated a normal response to exercise.  No T wave inversion was noted during stress.  Horizontal ST segment depression ST segment depression of 2 mm was noted during stress in the II, III and aVF leads, and returning to baseline after less than 1 minute of recovery.  LHC 07/17/16:  The left ventricular systolic function is normal.  LV end diastolic pressure is normal.  The left ventricular ejection fraction is greater than 65% by visual estimate.  There is no mitral valve regurgitation.  Prox RCA lesion, 20 %stenosed.  Mid RCA lesion, 50 %stenosed.  Dist RCA lesion, 100 %stenosed.  Ost LM lesion, 40 %stenosed.  Ost LM to LM lesion, 40 %stenosed.  Mid LAD lesion, 50 %stenosed.  1st Diag lesion, 70 %stenosed.  Ost 1st Mrg to 1st Mrg lesion, 40 %stenosed.  Ost LAD to Prox LAD lesion, 20 %stenosed.  Prox RCA to Mid  RCA lesion, 20 %stenosed.   1. Triple vessel CAD  2. Mild to moderate stenosis left main artery. This appears to be eccentric and does not appear to be flow limiting.  3. Moderate stenosis mid LAD. The small caliber diagonal branch has moderately severe stenosis.  4. Mild to moderate stenosis in the obtuse marginal branch of the Circumflex 5. The RCA is a large dominant vessel with moderate distal stenosis prior to total occlusion of the vessel at the distal bifurcation. The posterolateral artery and the posterior descending artery fills from left to right collaterals.  6. Normal LV systolic function  Recommendations: Her abnormal stress test is likely due to the chronic occlusion of the distal RCA. She has mild to  moderate stenosis in the left main artery as well as moderate stenosis in the LAD. I think the best plan for now is medical management of her CAD. If she fails medical management, will have to consider CABG.    Recent Labs: 05/15/2016: ALT 15; TSH 2.99 07/14/2016: BUN 18; Creat 0.84; Hemoglobin 14.8; Platelets 232; Potassium 5.0; Sodium 139   Lipid Panel    Component Value Date/Time   CHOL 240 (H) 05/15/2016 1107   TRIG 138.0 05/15/2016 1107   HDL 38.30 (L) 05/15/2016 1107   CHOLHDL 6 05/15/2016 1107   VLDL 27.6 05/15/2016 1107   LDLCALC 174 (H) 05/15/2016 1107      Wt Readings from Last 3 Encounters:  08/20/16 52.4 kg (115 lb 9.6 oz)  07/21/16 53.1 kg (117 lb)  07/17/16 52.2 kg (115 lb)      ASSESSMENT AND PLAN:  # 3 vessel CAD: # Shortness of breath: # Hyperlipidemia: Katrina Walters has 3 vessel CAD that is being medically managed.  She is feeling much better on metoprolol.  She hasn't been able to tolerate rosuvastatin or atorvastatin.  We will refer her to our lipid clinic for consideration of a PCSK9 inhibitor.  Continue aspirin and metoprolol.  # Hyperlipidemia: Stop atorvastatin.  Referral to pharmacy for PCSK9 inhibitor.   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  # Palpitations: Symptoms improved on metoprolol.   Current medicines are reviewed at length with the patient today.  The patient does not have concerns regarding medicines.  The following changes have been made:  no change  Labs/ tests ordered today include:   No orders of the defined types were placed in this encounter.    Disposition:   FU with Katrina Willert C. Oval Linsey, MD, [facility] in 3 months.     This note was written with the assistance of speech recognition software.  Please excuse any transcriptional errors.  Signed, Shalese Strahan C. Oval Linsey, MD, [facility]  08/20/2016 6:08 PM    [place]

## 2016-08-24 NOTE — Patient Instructions (Addendum)
For your history of hyperlipidemia continue healthy diet and exercise as tolerated provided to severe sob or cardiac type symptoms.  For your palpitations continue your metroprolol.  Regarding your CAD you have side effets with statin and cardiologist wants you start pcsk 9 inhibitor(65months since last lipid panel so will put in order to recheck cmp and lipid panel). If you fail this therapy then surgery might need to be considered as your cardiologist mentioned. If you have severe chest pain/cardiac type symptoms then ED evaluation.  For mediterranean fever continue the colchicine.  For your ankle pain will get xray of ankle. Can use ace wrap daily and can use low dose ibuprofen 200 mg-400 mg every 8 hours if needed.  For your early mild pain urinating will get urine culture and ancillary studies. Hydrate well while we wait on results. If symptoms worsen notify us as well.  For your osteopenia will rx alondrenate 70 mg q week. Will see if you tolerate this better than daily alondrenate.  I decided to make cipro 250 mg short course rx available in event over holiday weekend uti type symptoms occur pending labs. But hold rx presently  Follow up in 3 months or as needed

## 2016-08-24 NOTE — Progress Notes (Signed)
Subjective:    Patient ID: Katrina Walters, female    DOB: 1956/01/04, 60 y.o.   MRN: [id number]  HPI  Pt in for follow up on last visit with me she had mentioned some intermittent sob with activity. She had light smoking history in past. She thought minimal improvement with inhaler. I had referred pt to cardiologist. Pt has been evaluated.  Pt studies cardiologist studies showed:  The ekg ordered 2016/06/07 demonstrates sinus bradycardia. Rate 59 bpm.  ETT 06/27/16:  Blood pressure demonstrated a normal response to exercise.  No T wave inversion was noted during stress.  Horizontal ST segment depression ST segment depression of 2 mm was noted during stress in the II, III and aVF leads, and returning to baseline after less than 1 minute of recovery.  LHC 07/17/16:  The left ventricular systolic function is normal.  LV end diastolic pressure is normal.  The left ventricular ejection fraction is greater than 65% by visual estimate.  There is no mitral valve regurgitation.  Prox RCA lesion, 20 %stenosed.  Mid RCA lesion, 50 %stenosed.  Dist RCA lesion, 100 %stenosed.  Ost LM lesion, 40 %stenosed.  Ost LM to LM lesion, 40 %stenosed.  Mid LAD lesion, 50 %stenosed.  1st Diag lesion, 70 %stenosed.  Ost 1st Mrg to 1st Mrg lesion, 40 %stenosed.  Ost LAD to Prox LAD lesion, 20 %stenosed.  Prox RCA to Mid RCA lesion, 20 %stenosed.  Pt has 3 vessel disease CAD and hyperlipidemia. She could not tolerate crestor or atorvastatin. So pt was referred to pharmacy for PCSK9 inhibitor.  Pt did mention on last visit with cardiologist that she started back exercising and denied chest pain. She old cardioloigst only got mild short of breat after exercising on the treadmill for 30 minutes. She denied lower extremity edema, orthopnea or pnd.  Pt palpitations improved with metoprolol per cardiologist note.  Pt is still on colchicine due to mediterranean fever history.   Today pt updates me  that she was approved for the PCSK9 inhibitor. So pt explains plan is to see if this will help. Pt aware that she may need bypass in future.  Pt states she is eating healthier. Less cholesterol and low sugar diet. She is walking 3-4 times a week. No report of chest pain or neurologic signs or symptoms.  Pt states she has just little bit of burning on urination. Pt had this pain for 3 days. Not severe. No fever, no chills or back pain. See ros GU.  In addition she reports some medial ankle pain. No fall or trauma. Pain for 2-3 months. At rest not much pain but when walks has pain.    Review of Systems  Constitutional: Negative for chills, fatigue and fever.  Respiratory: Negative for cough, chest tightness, shortness of breath and wheezing.   Cardiovascular: Negative for chest pain and palpitations.  Gastrointestinal: Negative for abdominal pain, blood in stool, constipation, diarrhea and rectal pain.       Stomach mild upset with daily alondrenate she wants to take the weekly and see how she does.  Genitourinary: Positive for dysuria. Negative for difficulty urinating, frequency, hematuria, urgency, vaginal bleeding and vaginal pain.       Pt speculates may have itching. But she is not sure.  Musculoskeletal: Negative for back pain, joint swelling, myalgias, neck pain and neck stiffness.       Ankle and foot pain.  Skin: Negative for rash.  Neurological: Negative for dizziness, weakness, numbness and headaches.  Hematological: Negative for adenopathy. Does not bruise/bleed easily.  Psychiatric/Behavioral: Negative for behavioral problems, confusion and decreased concentration.    Past Medical History:  Diagnosis Date  . Eating disorder   . Genital warts   . Hyperlipidemia   . 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 Marital status: Married    Spouse name: N/A  . Number of children: N/A  . Years of education: N/A   Occupational History  .  unemployed at the time    Social History Main Topics  . Smoking status: Never Smoker  . Smokeless tobacco: Never Used  . Alcohol use No  . Drug use: No  . Sexual activity: Not on file   Other Topics Concern  . Not on file   Social History Narrative  . No narrative on file    Past Surgical History:  Procedure Laterality Date  . BREAST REDUCTION SURGERY  2006  . CARDIAC CATHETERIZATION N/A 07/17/2016   Procedure: Left Heart Cath and Coronary Angiography;  Surgeon: Burnell Blanks, MD;  Location: [place] [facility];  Service: Cardiovascular;  Laterality: N/A;    Family History  Problem Relation Age of Onset  . Heart disease Mother     died at 70 of heart attack  . Hypertension Mother   . Heart attack Mother   . Heart disease Father     had heart attack at age 56  . Heart attack Father   . Cancer Paternal Aunt     Allergies  Allergen Reactions  . Amoxicillin     Had fever with flu like symptoms. Has patient had a PCN reaction causing immediate rash, facial/tongue/throat swelling, SOB or lightheadedness with hypotension: no Has patient had a PCN reaction causing severe rash involving mucus membranes or skin necrosis: no Has patient had a PCN reaction that required hospitalization no Has patient had a PCN reaction occurring within the last 10 years: yes If all of the above answers are "NO", then may proceed with Cephalosporin use.  . Atorvastatin     Myalgia  . Crestor [Rosuvastatin Calcium]     MYALGIA     Current Outpatient Prescriptions on File Prior to Visit  Medication Sig Dispense Refill  . alendronate (FOSAMAX) 10 MG tablet Take 1 tablet (10 mg total) by mouth daily before breakfast. Take with a full glass of water on an empty stomach. 30 tablet 5  . aspirin EC 81 MG tablet Take 1 tablet (81 mg total) by mouth daily.    . COLCHICINE PO Take 1 tablet by mouth daily. Take 1mg  daily    . Melatonin 3 MG CAPS Take 1 capsule by mouth at bedtime.     .  metoprolol succinate (TOPROL XL) 25 MG 24 hr tablet Take 1 tablet (25 mg total) by mouth daily. 30 tablet 5  . Misc Natural Products (COMPLETE MENOPAUSE HEALTH PO) Take 1 Dose by mouth daily.     . Multiple Vitamin (MULTIVITAMIN) tablet Take 1 tablet by mouth daily.    Vladimir Faster Glycol-Propyl Glycol (SYSTANE OP) Place 1 drop into both eyes daily as needed. Dry eyes    . PREMARIN vaginal cream Place 0.5 g vaginally 2 (two) times a week.  4  . TURMERIC PO Take 1 tablet by mouth daily.    . Vitamin D, Ergocalciferol, (DRISDOL) 50000 units CAPS capsule Take 1 capsule (50,000 Units total) by mouth every 7 (seven) days. (Patient taking differently: Take 50,000 Units by  mouth every 7 (seven) days. Fridays) 6 capsule 0   No current facility-administered medications on file prior to visit.     LMP  (LMP Unknown)       Objective:   Physical Exam  General Mental Status- Alert. General Appearance- Not in acute distress.   Skin General: Color- Normal Color. Moisture- Normal Moisture.  Neck Carotid Arteries- Normal color. Moisture- Normal Moisture. No carotid bruits. No JVD.  Chest and Lung Exam Auscultation: Breath Sounds:-Normal. CTA.  Cardiovascular Auscultation:Rythm- Regular,rate and rhythm. Murmurs & Other Heart Sounds:Auscultation of the heart reveals- No Murmurs.  Abdomen Inspection:-Inspeection Normal. Palpation/Percussion:Note:No mass. Palpation and Percussion of the abdomen reveal- Non Tender, Non Distended + BS, no rebound or guarding.  Neurologic Cranial Nerve exam:- CN III-XII intact(No nystagmus), symmetric smile. Strength:- 5/5 equal and symmetric strength both upper and lower extremities.  Lower ext- no pedal edema. Negative homans sign.  Back- no cva tenderness.  Rt ankle- medial malleolus tender and tenderness medial foot. No swelling. No redness and no warmth.     Assessment & Plan:  For your history of hyperlipidemia continue healthy diet and exercise as  tolerated provided to severe sob or cardiac type symptoms.  For your palpitations continue your metroprolol.  Regarding your CAD you have side effets with statin and cardiologist wants you start pcsk 9 inhibitor. If you fail this therapy then surgery might need to be considered as your cardiologist mentioned. If you have severe chest pain/cardiac type symptoms then ED evaluation.  For mediterranean fever continue the colchicine.  For your ankle pain will get xray of ankle. Can use ace wrap daily and can use low dose ibuprofen 200 mg-400 mg every 8 hours if needed.  For your early mild pain urinating will get urine culture and ancillary studies. Hydrate well while we wait on results. If symptoms worsen notify us as well.  For your osteopenia will rx alondrenate 70 mg q week. Will see if you tolerate this better than daily alondrenate.  I decided to make cipro 250 mg short course rx available in event over holiday weekend uti type symptoms occur pending labs. But hold rx presently.  Follow up in 3 months or as needed.  Madi Bonfiglio, [name], PA-C

## 2016-08-28 ENCOUNTER — Ambulatory Visit (INDEPENDENT_AMBULATORY_CARE_PROVIDER_SITE_OTHER): Payer: BLUE CROSS/BLUE SHIELD | Admitting: Medical

## 2016-08-28 ENCOUNTER — Encounter: Payer: Self-pay | Admitting: Medical

## 2016-08-28 ENCOUNTER — Other Ambulatory Visit (HOSPITAL_COMMUNITY)
Admission: RE | Admit: 2016-08-28 | Discharge: 2016-08-28 | Disposition: A | Discharge status: Home / Self Care | Payer: BLUE CROSS/BLUE SHIELD | Source: Ambulatory Visit | Attending: Medical | Admitting: Medical

## 2016-08-28 ENCOUNTER — Ambulatory Visit (HOSPITAL_BASED_OUTPATIENT_CLINIC_OR_DEPARTMENT_OTHER)
Admission: RE | Admit: 2016-08-28 | Discharge: 2016-08-28 | Disposition: A | Discharge status: Home / Self Care | Payer: BLUE CROSS/BLUE SHIELD | Source: Ambulatory Visit | Attending: Medical | Admitting: Medical

## 2016-08-28 VITALS — BP 98/68 | HR 79 | Temp 97.9°F | Resp 16 | Ht 64.0 in | Wt 114.6 lb

## 2016-08-28 DIAGNOSIS — M25571 Pain in right ankle and joints of right foot: Secondary | ICD-10-CM | POA: Diagnosis present

## 2016-08-28 DIAGNOSIS — E785 Hyperlipidemia, unspecified: Secondary | ICD-10-CM

## 2016-08-28 DIAGNOSIS — M7731 Calcaneal spur, right foot: Secondary | ICD-10-CM | POA: Diagnosis not present

## 2016-08-28 DIAGNOSIS — R002 Palpitations: Secondary | ICD-10-CM | POA: Diagnosis not present

## 2016-08-28 DIAGNOSIS — A239 Brucellosis, unspecified: Secondary | ICD-10-CM | POA: Diagnosis not present

## 2016-08-28 DIAGNOSIS — M19071 Primary osteoarthritis, right ankle and foot: Secondary | ICD-10-CM | POA: Diagnosis not present

## 2016-08-28 DIAGNOSIS — M2011 Hallux valgus (acquired), right foot: Secondary | ICD-10-CM | POA: Diagnosis not present

## 2016-08-28 DIAGNOSIS — R300 Dysuria: Secondary | ICD-10-CM | POA: Insufficient documentation

## 2016-08-28 DIAGNOSIS — I25709 Atherosclerosis of coronary artery bypass graft(s), unspecified, with unspecified angina pectoris: Secondary | ICD-10-CM | POA: Diagnosis not present

## 2016-08-28 LAB — COMPREHENSIVE METABOLIC PANEL
ALT: 14 U/L (ref 0–35)
AST: 18 U/L (ref 0–37)
Albumin: 4.3 g/dL (ref 3.5–5.2)
Alkaline Phosphatase: 60 U/L (ref 39–117)
BUN: 16 mg/dL (ref 6–23)
CO2: 33 mEq/L — ABNORMAL HIGH (ref 19–32)
Calcium: 9.6 mg/dL (ref 8.4–10.5)
Chloride: 103 mEq/L (ref 96–112)
Creatinine, Ser: 0.81 mg/dL (ref 0.40–1.20)
GFR: 76.78 mL/min (ref >60.00)
Glucose, Bld: 91 mg/dL (ref 70–99)
Potassium: 4.8 mEq/L (ref 3.5–5.1)
Sodium: 139 mEq/L (ref 135–145)
Total Bilirubin: 0.4 mg/dL (ref 0.2–1.2)
Total Protein: 7.3 g/dL (ref 6.0–8.3)

## 2016-08-28 LAB — LIPID PANEL
Cholesterol: 180 mg/dL (ref 0–200)
HDL: 36 mg/dL — ABNORMAL LOW (ref >39.00)
LDL Cholesterol: 119 mg/dL — ABNORMAL HIGH (ref 0–99)
NonHDL: 143.66
Total CHOL/HDL Ratio: 5
Triglycerides: 124 mg/dL (ref 0.0–149.0)
VLDL: 24.8 mg/dL (ref 0.0–40.0)

## 2016-08-28 LAB — POC URINALSYSI DIPSTICK (AUTOMATED)
Bilirubin, UA: NEGATIVE
Blood, UA: NEGATIVE
Glucose, UA: NEGATIVE
Ketones, UA: NEGATIVE
Nitrite, UA: NEGATIVE
Protein, UA: NEGATIVE
Spec Grav, UA: >=1.03
Urobilinogen, UA: NEGATIVE
pH, UA: 5.5

## 2016-08-28 MED ORDER — ALENDRONATE SODIUM 70 MG PO TABS: 70.0000 mg | ORAL_TABLET | ORAL | 11 refills | Status: DC

## 2016-08-28 MED ORDER — CIPROFLOXACIN HCL 250 MG PO TABS
250.0000 mg | ORAL_TABLET | Freq: Two times a day (BID) | ORAL | 0 refills | Status: DC
Start: 2016-08-28 — End: 2017-09-24

## 2016-08-29 LAB — URINE CYTOLOGY ANCILLARY ONLY
Chlamydia: NEGATIVE
Neisseria Gonorrhea: NEGATIVE
Trichomonas: NEGATIVE

## 2016-08-30 LAB — URINE CULTURE

## 2016-09-03 LAB — URINE CYTOLOGY ANCILLARY ONLY
Bacterial vaginitis: NEGATIVE
Candida vaginitis: NEGATIVE

## 2016-09-15 ENCOUNTER — Ambulatory Visit (INDEPENDENT_AMBULATORY_CARE_PROVIDER_SITE_OTHER): Payer: BLUE CROSS/BLUE SHIELD | Admitting: Pharmacist Clinician (PhC)/ Clinical Pharmacy Specialist

## 2016-09-15 ENCOUNTER — Other Ambulatory Visit: Payer: Self-pay | Admitting: *Deleted

## 2016-09-15 DIAGNOSIS — E785 Hyperlipidemia, unspecified: Secondary | ICD-10-CM

## 2016-09-15 MED ORDER — ALENDRONATE SODIUM 70 MG PO TABS: 70.0000 mg | ORAL_TABLET | ORAL | 3 refills | Status: DC

## 2016-09-15 NOTE — Progress Notes (Signed)
Faxed refill request received from [facility] for Fosamax Refill sent per [facility] refill protocol/SLS

## 2016-09-15 NOTE — Assessment & Plan Note (Signed)
Last cholesterol labs showed improvement - LDL from 174 to 119, however they were drawn about 1 week after stopping atorvastatin.  Still not to goal for her ASCVD.  Will submit request to start Repathat 140 mg every 2 weeks with her insurance company.   Patient aware of using specialty pharmacy if approved, and $5 copay card given.

## 2016-09-15 NOTE — Progress Notes (Signed)
09/15/2016 [name] 01-Apr-1956 [id number]   HPI:  Katrina Walters is a 61 y.o. female patient of Dr Oval Linsey, who presents today for a lipid clinic evaluation.  Her cardiac history is significant for 3 vessel CAD including a 100% stenosed distal RCA with left to right collaterals.  Her calcium score in 2015 was 376, (98% for age and gender).    Current Medications:  None  Cholesterol Goals:   LDL < 70  Intolerant/previously tried:  Rosuvastatin 10 mg - tried in March 2016 for about a month then again in September 2017.  Caused myalgias in feet and legs.    Atorvastatin 20 mg - tried in November 2017, but muscle pains returned, to the point where she was unable to walk for 2-3 days.  Family history:   Mother died from MI at age 12, father had MI at 20, then CABG, now stable at 71; maternal uncle with CABG  Social History:  Never smoker or user of tobacco; no alcohol Diet:   Eats about half meals at home; otherwise in sit down restaurants;  Eats plenty of vegetables/fruits; enjoys home made smoothies once or twice weekly with berries, bananas, kale, spinach.  Does not eat anything fried.  Mostly chicken and fish with vegetables at dinner;  Admits to a sweet tooth, but is trying to limit herself to dark chocolate once daily.  Exercise:    Uses treadmill for 30 minutes 3-4 times per week  Labs:  @LASTLAB3 (LIPID)@  Current Outpatient Prescriptions  Medication Sig Dispense Refill  . alendronate (FOSAMAX) 70 MG tablet Take 1 tablet (70 mg total) by mouth once a week. Take with a full glass of water on an empty stomach. 12 tablet 3  . aspirin EC 81 MG tablet Take 1 tablet (81 mg total) by mouth daily.    . ciprofloxacin (CIPRO) 250 MG tablet Take 1 tablet (250 mg total) by mouth 2 (two) times daily. 6 tablet 0  . COLCHICINE PO Take 1 tablet by mouth daily. Take 1mg  daily    . Melatonin 3 MG CAPS Take 1 capsule by mouth at bedtime.     . metoprolol succinate (TOPROL XL) 25 MG 24 hr tablet Take  1 tablet (25 mg total) by mouth daily. 30 tablet 5  . Misc Natural Products (COMPLETE MENOPAUSE HEALTH PO) Take 1 Dose by mouth daily.     . Multiple Vitamin (MULTIVITAMIN) tablet Take 1 tablet by mouth daily.    Katrina Walters Glycol-Propyl Glycol (SYSTANE OP) Place 1 drop into both eyes daily as needed. Dry eyes    . PREMARIN vaginal cream Place 0.5 g vaginally 2 (two) times a week.  4  . TURMERIC PO Take 1 tablet by mouth daily.    . Vitamin D, Ergocalciferol, (DRISDOL) 50000 units CAPS capsule Take 1 capsule (50,000 Units total) by mouth every 7 (seven) days. (Patient taking differently: Take 50,000 Units by mouth every 7 (seven) days. Fridays) 6 capsule 0   No current facility-administered medications for this visit.     Allergies  Allergen Reactions  . Amoxicillin     Had fever with flu like symptoms. Has patient had a PCN reaction causing immediate rash, facial/tongue/throat swelling, SOB or lightheadedness with hypotension: no Has patient had a PCN reaction causing severe rash involving mucus membranes or skin necrosis: no Has patient had a PCN reaction that required hospitalization no Has patient had a PCN reaction occurring within the last 10 years: yes If all of the above answers are "  NO", then may proceed with Cephalosporin use.  . Atorvastatin     Myalgia  . Crestor [Rosuvastatin Calcium]     MYALGIA     Past Medical History:  Diagnosis Date  . Eating disorder   . Genital warts   . Hyperlipidemia   . 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   Hyperlipidemia Last cholesterol labs showed improvement - LDL from 174 to 119, however they were drawn about 1 week after stopping atorvastatin.  Still not to goal for her ASCVD.  Will submit request to start Repathat 140 mg every 2 weeks with her insurance company.   Patient aware of using specialty pharmacy if approved, and $5 copay card given.     Tommy Medal PharmD CPP [facility]

## 2016-09-15 NOTE — Patient Instructions (Signed)
Cholesterol Cholesterol is a fat. Your body needs a small amount of cholesterol. Cholesterol (plaque) may build up in your blood vessels (arteries). That makes you more likely to have a heart attack or stroke. You cannot feel your cholesterol level. Having a blood test is the only way to find out if your level is high. Keep your test results. Work with your doctor to keep your cholesterol at a good level. What do the results mean?  Total cholesterol is how much cholesterol is in your blood.  LDL is bad cholesterol. This is the type that can build up. Try to have low LDL.  HDL is good cholesterol. It cleans your blood vessels and carries LDL away. Try to have high HDL.  Triglycerides are fat that the body can store or burn for energy. What are good levels of cholesterol?  Total cholesterol below 200.  LDL below 100 is good for people who have health risks. LDL below 70 is good for people who have very high risks.  HDL above 40 is good. It is best to have HDL of 60 or higher.  Triglycerides below 150. How can I lower my cholesterol? Diet  Follow your diet program as told by your doctor.  Choose fish, white meat chicken, or turkey that is roasted or baked. Try not to eat red meat, fried foods, sausage, or lunch meats.  Eat lots of fresh fruits and vegetables.  Choose whole grains, beans, pasta, potatoes, and cereals.  Choose olive oil, corn oil, or canola oil. Only use small amounts.  Try not to eat butter, mayonnaise, shortening, or palm kernel oils.  Try not to eat foods with trans fats.  Choose low-fat or nonfat dairy foods.  Drink skim or nonfat milk.  Eat low-fat or nonfat yogurt and cheeses.  Try not to drink whole milk or cream.  Try not to eat ice cream, egg yolks, or full-fat cheeses.  Healthy desserts include angel food cake, ginger snaps, animal crackers, hard candy, popsicles, and low-fat or nonfat frozen yogurt. Try not to eat pastries, cakes, pies, and  cookies. Exercise  Follow your exercise program as told by your doctor.  Be more active. Try gardening, walking, and taking the stairs.  Ask your doctor about ways that you can be more active. Medicine  Take over-the-counter and prescription medicines only as told by your doctor. This information is not intended to replace advice given to you by your health care provider. Make sure you discuss any questions you have with your health care provider. Document Released: 11/21/2008 Document Revised: 03/26/2016 Document Reviewed: 03/06/2016 Elsevier Interactive Patient Education  2017 Elsevier Inc.  

## 2016-10-03 ENCOUNTER — Other Ambulatory Visit: Payer: Self-pay | Admitting: Pharmacist Clinician (PhC)/ Clinical Pharmacy Specialist

## 2016-10-03 MED ORDER — EVOLOCUMAB 140 MG/ML ~~LOC~~ SOAJ: 140.0000 mg | SUBCUTANEOUS | 11 refills | Status: DC

## 2016-10-03 NOTE — Telephone Encounter (Signed)
Repatha PA approved thru 09-2017

## 2016-10-15 ENCOUNTER — Telehealth: Payer: Self-pay | Admitting: Cardiovascular Disease

## 2016-10-15 NOTE — Telephone Encounter (Signed)
Inquiry regarding patient being set up for repatha injections.

## 2016-10-15 NOTE — Telephone Encounter (Signed)
New message  Katrina Walters from [facility] call requesting to speak with RN. Katrina Walters states pt is going to need doctors signature for pt to receive one on one training for Repatha injections. Katrina Walters states the information can be sent on a prescription form with the following information faxed.  Patients Name and DOB Doctors Name, date, and signature Also stating patient in home injection training for Repatha is approved.  Fax # [phone]  if you have any questions please call to discuss

## 2016-10-16 DIAGNOSIS — N8110 Cystocele, unspecified: Secondary | ICD-10-CM | POA: Diagnosis not present

## 2016-10-16 DIAGNOSIS — N905 Atrophy of vulva: Secondary | ICD-10-CM | POA: Diagnosis not present

## 2016-10-16 DIAGNOSIS — L298 Other pruritus: Secondary | ICD-10-CM | POA: Diagnosis not present

## 2016-10-16 DIAGNOSIS — N898 Other specified noninflammatory disorders of vagina: Secondary | ICD-10-CM | POA: Diagnosis not present

## 2016-10-16 NOTE — Telephone Encounter (Signed)
Will forward to Pharm D  

## 2016-10-17 NOTE — Telephone Encounter (Signed)
She needs an order on in home injection training for Repatha is approved. This must be on a prescription pad or letter head,it must have the pt's Name and DOB. It will also needs the name of the doctor and her signature and date when it is signed.  Please fax to [phone].

## 2016-10-17 NOTE — Telephone Encounter (Signed)
Called [facility] and offices currently closed. Will try again later.    Will need Dr Oval Linsey signature once order written

## 2016-10-20 NOTE — Telephone Encounter (Signed)
Patient needs letter/prescription for in home training of Repatha [employer].  Letter given to Dr. Oval Linsey to sign.   Will fax to above number once done.

## 2016-10-27 DIAGNOSIS — H524 Presbyopia: Secondary | ICD-10-CM | POA: Diagnosis not present

## 2016-10-27 DIAGNOSIS — H1045 Other chronic allergic conjunctivitis: Secondary | ICD-10-CM | POA: Diagnosis not present

## 2016-10-27 DIAGNOSIS — H31091 Other chorioretinal scars, right eye: Secondary | ICD-10-CM | POA: Diagnosis not present

## 2016-10-27 DIAGNOSIS — H4423 Degenerative myopia, bilateral: Secondary | ICD-10-CM | POA: Diagnosis not present

## 2016-10-27 DIAGNOSIS — H5211 Myopia, right eye: Secondary | ICD-10-CM | POA: Diagnosis not present

## 2016-10-27 DIAGNOSIS — H04123 Dry eye syndrome of bilateral lacrimal glands: Secondary | ICD-10-CM | POA: Diagnosis not present

## 2016-10-27 DIAGNOSIS — H3122 Choroidal dystrophy (central areolar) (generalized) (peripapillary): Secondary | ICD-10-CM | POA: Diagnosis not present

## 2016-10-27 DIAGNOSIS — H0011 Chalazion right upper eyelid: Secondary | ICD-10-CM | POA: Diagnosis not present

## 2016-10-27 DIAGNOSIS — H35371 Puckering of macula, right eye: Secondary | ICD-10-CM | POA: Diagnosis not present

## 2016-10-30 DIAGNOSIS — Z124 Encounter for screening for malignant neoplasm of cervix: Secondary | ICD-10-CM | POA: Diagnosis not present

## 2016-10-30 DIAGNOSIS — Z01419 Encounter for gynecological examination (general) (routine) without abnormal findings: Secondary | ICD-10-CM | POA: Diagnosis not present

## 2016-10-30 DIAGNOSIS — N816 Rectocele: Secondary | ICD-10-CM | POA: Diagnosis not present

## 2016-10-30 DIAGNOSIS — R87610 Atypical squamous cells of undetermined significance on cytologic smear of cervix (ASC-US): Secondary | ICD-10-CM | POA: Diagnosis not present

## 2016-10-30 DIAGNOSIS — N8110 Cystocele, unspecified: Secondary | ICD-10-CM | POA: Diagnosis not present

## 2016-10-30 DIAGNOSIS — N3090 Cystitis, unspecified without hematuria: Secondary | ICD-10-CM | POA: Diagnosis not present

## 2016-11-03 ENCOUNTER — Telehealth: Payer: Self-pay | Admitting: Pharmacist Clinician (PhC)/ Clinical Pharmacy Specialist

## 2016-11-03 DIAGNOSIS — E785 Hyperlipidemia, unspecified: Secondary | ICD-10-CM

## 2016-11-03 NOTE — Telephone Encounter (Signed)
Patient took first dose of Repatha on Feb 12, no complaints or concerns.  Will have her repeat lipid labs after 6th dose.  Patient voiced understanding.

## 2016-11-21 ENCOUNTER — Ambulatory Visit (INDEPENDENT_AMBULATORY_CARE_PROVIDER_SITE_OTHER): Payer: BLUE CROSS/BLUE SHIELD | Admitting: Cardiovascular Disease

## 2016-11-21 ENCOUNTER — Encounter: Payer: Self-pay | Admitting: Cardiovascular Disease

## 2016-11-21 VITALS — BP 123/71 | HR 66 | Ht 64.0 in | Wt 116.6 lb

## 2016-11-21 DIAGNOSIS — I119 Hypertensive heart disease without heart failure: Secondary | ICD-10-CM

## 2016-11-21 DIAGNOSIS — I2510 Atherosclerotic heart disease of native coronary artery without angina pectoris: Secondary | ICD-10-CM | POA: Diagnosis not present

## 2016-11-21 DIAGNOSIS — E785 Hyperlipidemia, unspecified: Secondary | ICD-10-CM | POA: Diagnosis not present

## 2016-11-21 DIAGNOSIS — R002 Palpitations: Secondary | ICD-10-CM | POA: Diagnosis not present

## 2016-11-21 MED ORDER — CLOPIDOGREL BISULFATE 75 MG PO TABS: 75.0000 mg | ORAL_TABLET | Freq: Every day | ORAL | 3 refills | Status: DC

## 2016-11-21 MED ORDER — METOPROLOL SUCCINATE ER 25 MG PO TB24: 25.0000 mg | ORAL_TABLET | Freq: Every day | ORAL | 3 refills | Status: DC

## 2016-11-21 NOTE — Patient Instructions (Signed)
Medication Instructions:   Stop aspirin Start clopidogrel (Plavix) 75mg  by mouth daily   Labwork: none   Testing/Procedures: none   Follow-Up: w Dr. Oval Linsey in 6 months   If you need a refill on your cardiac medications before your next appointment, please call your pharmacy.

## 2016-11-21 NOTE — Progress Notes (Signed)
Cardiology Office Note   Date:  11/21/2016   ID:  Katrina Walters, DOB 11/11/1955, MRN [id number]  PCP:  Mackie Pai, PA-C  Cardiologist:   Skeet Latch, MD   Chief Complaint  Patient presents with  . Follow-up    3 months;   . Shortness of Breath    occasionally.     History of Present Illness: Katrina Walters is a 61 y.o. female with obstructive CAD (medically managed), shortness of breath, and hyperlipidemia and who presents for follow up.  Katrina Walters was seen in clinic 06/13/16 with exertional shortness of breath.  She had a known coronary calcium score of 376 in 2015 (98% for age and gender).  She was referred for stress testing 11/2014 but was unable to complete it due to cost.  She was able to perform an ETT 06/27/16 that was notable for 85mm ST depressions inferiorly.  She was referred for [facility] 07/17/16 that revealed LVEF 65% with 100% distal RCA, 50% mid RCA, 40% ostial LM, 50% mi d LAD, 70% D1 lesions.  Her symptoms were felt to be attributable to her chronically occluded RCA which had left to right collaterals.  Medical management was recommended with consideration for CABG if her symptoms persist.  She was started on rosuvastatin but did not tolerate due to myalgias. This was switched to atorvastatin.  However this was stopped due to a rash and swelling. At her last appointment she was referred to our pharmacist and started on [place].  Since Her last appointment Katrina Walters is been doing well. She denies any chest pain or pressure. She continues to have some mild shortness of breath with extreme exertion. She has also noted some itching that occurs after taking a shower. She missed her aspirin for 2 days and noticed that it improved. Therefore, she attributes this to her aspirin. She's been working on her diet and is eating mostly chicken and fish. She is doing well on Repatha and denies any myalgias. She also denies lower extremity edema, orthopnea, or PND. She has been walking most days  of the week and has no exertional symptoms.    Past Medical History:  Diagnosis Date  . Eating disorder   . Genital warts   . Hyperlipidemia   . 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 BREAST REDUCTION SURGERY  2006  . CARDIAC CATHETERIZATION N/A 07/17/2016   Procedure: Left Heart Cath and Coronary Angiography;  Surgeon: Burnell Blanks, MD;  Location: [place] [facility];  Service: Cardiovascular;  Laterality: N/A;     Current Outpatient Prescriptions  Medication Sig Dispense Refill  . alendronate (FOSAMAX) 70 MG tablet Take 1 tablet (70 mg total) by mouth once a week. Take with a full glass of water on an empty stomach. 12 tablet 3  . ciprofloxacin (CIPRO) 250 MG tablet Take 1 tablet (250 mg total) by mouth 2 (two) times daily. 6 tablet 0  . COLCHICINE PO Take 1 tablet by mouth daily. Take 1mg  daily    . Evolocumab (REPATHA SURECLICK) 010 MG/ML SOAJ Inject 140 mg into the skin every 14 (fourteen) days. 2 pen 11  . Melatonin 3 MG CAPS Take 1 capsule by mouth at bedtime.     . metoprolol succinate (TOPROL XL) 25 MG 24 hr tablet Take 1 tablet (25 mg total) by mouth daily. 90 tablet 3  . Misc Natural Products (COMPLETE MENOPAUSE HEALTH PO) Take 1 Dose by mouth daily.     [name]  Multiple Vitamin (MULTIVITAMIN) tablet Take 1 tablet by mouth daily.    Vladimir Faster Glycol-Propyl Glycol (SYSTANE OP) Place 1 drop into both eyes daily as needed. Dry eyes    . PREMARIN vaginal cream Place 0.5 g vaginally 2 (two) times a week.  4  . TURMERIC PO Take 1 tablet by mouth daily.    . Vitamin D, Ergocalciferol, (DRISDOL) 50000 units CAPS capsule Take 1 capsule (50,000 Units total) by mouth every 7 (seven) days. (Patient taking differently: Take 50,000 Units by mouth every 7 (seven) days. Fridays) 6 capsule 0  . clopidogrel (PLAVIX) 75 MG tablet Take 1 tablet (75 mg total) by mouth daily. 90 tablet 3   No current facility-administered medications for  this visit.     Allergies:   Amoxicillin; Aspirin; Atorvastatin; and Crestor [rosuvastatin calcium]    Social History:  The patient  reports that she has never smoked. She has never used smokeless tobacco. She reports that she does not drink alcohol or use drugs.   Family History:  The patient's family history includes Cancer in her paternal aunt; Heart attack in her father and mother; Heart disease in her father and mother; Hypertension in her mother.    ROS:  Please see the history of present illness.   Otherwise, review of systems are positive for none.   All other systems are reviewed and negative.    PHYSICAL EXAM: VS:  BP 123/71   Pulse 66   Ht 5\' 4"  (1.626 m)   Wt 52.9 kg (116 lb 9.6 oz)   LMP  (LMP Unknown)   BMI 20.01 kg/m  , BMI Body mass index is 20.01 kg/m. GENERAL:  Well appearing HEENT:  Pupils equal round and reactive, fundi not visualized, oral mucosa unremarkable NECK:  No jugular venous distention, waveform within normal limits, carotid upstroke brisk and symmetric, no bruits LYMPHATICS:  No cervical adenopathy LUNGS:  Clear to auscultation bilaterally HEART:  RRR.  PMI not displaced or sustained,S1 and S2 within normal limits, no S3, no S4, no clicks, no rubs, no murmurs ABD:  Flat, positive bowel sounds normal in frequency in pitch, no bruits, no rebound, no guarding, no midline pulsatile mass, no hepatomegaly, no splenomegaly EXT:  2 plus pulses throughout, no edema, no cyanosis no clubbing SKIN:  No rashes no nodules NEURO:  Cranial nerves II through XII grossly intact, motor grossly intact throughout PSYCH:  Cognitively intact, oriented to person place and time   EKG:  EKG is not ordered today. The ekg ordered 05/15/16 demonstrates sinus bradycardia. Rate 59 bpm.  ETT 06/27/16:  Blood pressure demonstrated a normal response to exercise.  No T wave inversion was noted during stress.  Horizontal ST segment depression ST segment depression of 2 mm was  noted during stress in the II, III and aVF leads, and returning to baseline after less than 1 minute of recovery.  LHC 07/17/16:  The left ventricular systolic function is normal.  LV end diastolic pressure is normal.  The left ventricular ejection fraction is greater than 65% by visual estimate.  There is no mitral valve regurgitation.  Prox RCA lesion, 20 %stenosed.  Mid RCA lesion, 50 %stenosed.  Dist RCA lesion, 100 %stenosed.  Ost LM lesion, 40 %stenosed.  Ost LM to LM lesion, 40 %stenosed.  Mid LAD lesion, 50 %stenosed.  1st Diag lesion, 70 %stenosed.  Ost 1st Mrg to 1st Mrg lesion, 40 %stenosed.  Ost LAD to Prox LAD lesion, 20 %stenosed.  Prox RCA to  Mid RCA lesion, 20 %stenosed.   1. Triple vessel CAD  2. Mild to moderate stenosis left main artery. This appears to be eccentric and does not appear to be flow limiting.  3. Moderate stenosis mid LAD. The small caliber diagonal branch has moderately severe stenosis.  4. Mild to moderate stenosis in the obtuse marginal branch of the Circumflex 5. The RCA is a large dominant vessel with moderate distal stenosis prior to total occlusion of the vessel at the distal bifurcation. The posterolateral artery and the posterior descending artery fills from left to right collaterals.  6. Normal LV systolic function  Recommendations: Her abnormal stress test is likely due to the chronic occlusion of the distal RCA. She has mild to moderate stenosis in the left main artery as well as moderate stenosis in the LAD. I think the best plan for now is medical management of her CAD. If she fails medical management, will have to consider CABG.    Recent Labs: 05/15/2016: TSH 2.99 07/14/2016: Hemoglobin 14.8; Platelets 232 08/28/2016: ALT 14; BUN 16; Creatinine, Ser 0.81; Potassium 4.8; Sodium 139   Lipid Panel    Component Value Date/Time   CHOL 180 08/28/2016 0905   TRIG 124.0 08/28/2016 0905   HDL 36.00 (L) 08/28/2016 0905   CHOLHDL  5 08/28/2016 0905   VLDL 24.8 08/28/2016 0905   LDLCALC 119 (H) 08/28/2016 0905      Wt Readings from Last 3 Encounters:  11/21/16 52.9 kg (116 lb 9.6 oz)  08/28/16 52 kg (114 lb 9.6 oz)  08/20/16 52.4 kg (115 lb 9.6 oz)      ASSESSMENT AND PLAN:  # 3 vessel CAD: # Shortness of breath: # Hyperlipidemia: Katrina Walters has 3 vessel CAD that is being medically managed.  She is feeling much better on metoprolol.  She is tolerating Repatha well.  Continue metoprolol.  She developed itching on aspirin so we will switch to clopidogrel 75 mg daily.  # Hyperlipidemia: She is doing well on Repatha.  Continue lipid monitoring per pharmacy protocol.  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  # Palpitations: Symptoms improved on metoprolol.   Current medicines are reviewed at length with the patient today.  The patient does not have concerns regarding medicines.  The following changes have been made:  Stop aspirin and start carvedilol 75 mg daily.  Labs/ tests ordered today include:   No orders of the defined types were placed in this encounter.    Disposition:   FU with Katrina Ewer C. Oval Linsey, MD, [facility] in 6 months.     This note was written with the assistance of speech recognition software.  Please excuse any transcriptional errors.  Signed, Dasani Crear C. Oval Linsey, MD, [facility]  11/21/2016 3:21 PM    [place]

## 2016-11-26 ENCOUNTER — Ambulatory Visit: Payer: BLUE CROSS/BLUE SHIELD | Admitting: Medical

## 2016-11-26 ENCOUNTER — Telehealth: Payer: Self-pay | Admitting: Medical

## 2016-11-26 NOTE — Telephone Encounter (Signed)
Pt lvm at 8:11 to cancel and reschedule appt due to weather. Pt has been rescheduled for 12/18/16

## 2016-11-26 NOTE — Telephone Encounter (Signed)
No charge. 

## 2016-12-07 ENCOUNTER — Other Ambulatory Visit: Payer: Self-pay | Admitting: Cardiovascular Disease

## 2016-12-08 NOTE — Telephone Encounter (Signed)
Refill Request.  

## 2016-12-18 ENCOUNTER — Encounter: Payer: Self-pay | Admitting: Medical

## 2016-12-18 ENCOUNTER — Ambulatory Visit (INDEPENDENT_AMBULATORY_CARE_PROVIDER_SITE_OTHER): Payer: BLUE CROSS/BLUE SHIELD | Admitting: Medical

## 2016-12-18 VITALS — BP 107/67 | HR 73 | Temp 98.1°F | Resp 14 | Ht 64.0 in | Wt 111.0 lb

## 2016-12-18 DIAGNOSIS — J0100 Acute maxillary sinusitis, unspecified: Secondary | ICD-10-CM

## 2016-12-18 DIAGNOSIS — J40 Bronchitis, not specified as acute or chronic: Secondary | ICD-10-CM | POA: Diagnosis not present

## 2016-12-18 DIAGNOSIS — E785 Hyperlipidemia, unspecified: Secondary | ICD-10-CM

## 2016-12-18 DIAGNOSIS — G4700 Insomnia, unspecified: Secondary | ICD-10-CM

## 2016-12-18 DIAGNOSIS — A239 Brucellosis, unspecified: Secondary | ICD-10-CM

## 2016-12-18 LAB — COMPREHENSIVE METABOLIC PANEL
ALT: 12 U/L (ref 0–35)
AST: 19 U/L (ref 0–37)
Albumin: 4.2 g/dL (ref 3.5–5.2)
Alkaline Phosphatase: 58 U/L (ref 39–117)
BUN: 14 mg/dL (ref 6–23)
CO2: 31 mEq/L (ref 19–32)
Calcium: 9.3 mg/dL (ref 8.4–10.5)
Chloride: 103 mEq/L (ref 96–112)
Creatinine, Ser: 0.78 mg/dL (ref 0.40–1.20)
GFR: 80.12 mL/min (ref >60.00)
Glucose, Bld: 89 mg/dL (ref 70–99)
Potassium: 4.6 mEq/L (ref 3.5–5.1)
Sodium: 138 mEq/L (ref 135–145)
Total Bilirubin: 0.4 mg/dL (ref 0.2–1.2)
Total Protein: 7.4 g/dL (ref 6.0–8.3)

## 2016-12-18 LAB — LIPID PANEL
Cholesterol: 100 mg/dL (ref 0–200)
HDL: 26.2 mg/dL — ABNORMAL LOW (ref >39.00)
LDL Cholesterol: 48 mg/dL (ref 0–99)
NonHDL: 73.92
Total CHOL/HDL Ratio: 4
Triglycerides: 129 mg/dL (ref 0.0–149.0)
VLDL: 25.8 mg/dL (ref 0.0–40.0)

## 2016-12-18 MED ORDER — FLUTICASONE PROPIONATE 50 MCG/ACT NA SUSP: 2.0000 | Freq: Every day | NASAL | 1 refills | Status: DC

## 2016-12-18 MED ORDER — DOXYCYCLINE HYCLATE 100 MG PO TABS: 100.0000 mg | ORAL_TABLET | Freq: Two times a day (BID) | ORAL | 0 refills | Status: DC

## 2016-12-18 MED ORDER — BENZONATATE 100 MG PO CAPS: 100.0000 mg | ORAL_CAPSULE | Freq: Three times a day (TID) | ORAL | 0 refills | Status: DC | PRN

## 2016-12-18 MED ORDER — COLCHICINE 0.6 MG PO TABS: 0.6000 mg | ORAL_TABLET | Freq: Two times a day (BID) | ORAL | 3 refills | Status: DC

## 2016-12-18 MED ORDER — ALBUTEROL SULFATE HFA 108 (90 BASE) MCG/ACT IN AERS: 2.0000 | INHALATION_SPRAY | Freq: Four times a day (QID) | RESPIRATORY_TRACT | 0 refills | Status: DC | PRN

## 2016-12-18 MED ORDER — HYDROXYZINE HCL 10 MG PO TABS: ORAL_TABLET | ORAL | 0 refills | Status: DC

## 2016-12-18 NOTE — Progress Notes (Signed)
Pre visit review using our clinic review tool, if applicable. No additional management support is needed unless otherwise documented below in the visit note. 

## 2016-12-18 NOTE — Patient Instructions (Addendum)
You appear to have bronchitis and sinusitis. Rest hydrate and tylenol for fever. I am prescribing cough medicine benzonatate, and doxycycline antibiotic. For your nasal congestion you could use flonase.  For wheezing rx albuterol  I would hold repatha today and update your cardiologist. Restart in 7-10 days when feels better.  Refilling colcrys today.  For insomnia dc trazadone and try hydroxyzine.  You should gradually get better. If not then notify us and would recommend a chest xray.  Follow up in 7-10 days or as needed

## 2016-12-18 NOTE — Progress Notes (Signed)
Subjective:    Patient ID: Katrina Walters, female    DOB: [birth year], 61 y.o.   MRN: [id number]  HPI   Pt is in reporting that she feels sick recently.   Husband was sick recently with uri type symptoms first. Then pt got nasal congested and runny nose. Last week she had some chills with no other symptoms. Pt has some sinus pressure and coughing up some mucous.   Pt is on repatha and her cardiologist rx'd. Pt aware of precautions that can effect immune system. She thinks this played a role in her getting sick.  Some mild wheezing recently. Hx of mild seasonal allergies.    Review of Systems  Constitutional: Negative for chills and fatigue.  HENT: Positive for congestion, rhinorrhea and sinus pressure. Negative for dental problem, nosebleeds, sore throat and tinnitus.   Respiratory: Positive for wheezing. Negative for apnea, cough and choking.        Mid wheezing.  Cardiovascular: Negative for chest pain and palpitations.  Gastrointestinal: Negative for abdominal pain.  Genitourinary: Negative for dyspareunia.  Musculoskeletal: Negative for back pain, myalgias, neck pain and neck stiffness.  Skin: Negative for rash.  Neurological: Negative for dizziness, weakness, numbness and headaches.  Psychiatric/Behavioral: Positive for sleep disturbance. Negative for behavioral problems, decreased concentration, dysphoric mood and suicidal ideas.       Pt states trazadone made her dizzy and very tired.    Past Medical History:  Diagnosis Date  . Eating disorder   . Genital warts   . Hyperlipidemia   . 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 Marital status: Married    Spouse name: N/A  . Number of children: N/A  . Years of education: N/A   Occupational History  . unemployed at the time    Social History Main Topics  . Smoking status: Never Smoker  . Smokeless tobacco: Never Used  . Alcohol use No  . Drug use: No  . Sexual activity: Not on file    Other Topics Concern  . Not on file   Social History Narrative  . No narrative on file    Past Surgical History:  Procedure Laterality Date  . BREAST REDUCTION SURGERY  2006  . CARDIAC CATHETERIZATION N/A 07/17/2016   Procedure: Left Heart Cath and Coronary Angiography;  Surgeon: Burnell Blanks, MD;  Location: [place] [facility];  Service: Cardiovascular;  Laterality: N/A;    Family History  Problem Relation Age of Onset  . Heart disease Mother     died at 40 of heart attack  . Hypertension Mother   . Heart attack Mother   . Heart disease Father     had heart attack at age 36  . Heart attack Father   . Cancer Paternal Aunt     Allergies  Allergen Reactions  . Amoxicillin     Had fever with flu like symptoms. Has patient had a PCN reaction causing immediate rash, facial/tongue/throat swelling, SOB or lightheadedness with hypotension: no Has patient had a PCN reaction causing severe rash involving mucus membranes or skin necrosis: no Has patient had a PCN reaction that required hospitalization no Has patient had a PCN reaction occurring within the last 10 years: yes If all of the above answers are "NO", then may proceed with Cephalosporin use.  . Aspirin Itching  . Atorvastatin     Myalgia  . Crestor [Rosuvastatin Calcium]     MYALGIA  Current Outpatient Prescriptions on File Prior to Visit  Medication Sig Dispense Refill  . alendronate (FOSAMAX) 70 MG tablet Take 1 tablet (70 mg total) by mouth once a week. Take with a full glass of water on an empty stomach. 12 tablet 3  . ciprofloxacin (CIPRO) 250 MG tablet Take 1 tablet (250 mg total) by mouth 2 (two) times daily. 6 tablet 0  . clopidogrel (PLAVIX) 75 MG tablet Take 1 tablet (75 mg total) by mouth daily. 90 tablet 3  . COLCHICINE PO Take 1 tablet by mouth daily. Take 1mg  daily    . Evolocumab (REPATHA SURECLICK) 580 MG/ML SOAJ Inject 140 mg into the skin every 14 (fourteen) days. 2 pen 11  . Melatonin  3 MG CAPS Take 1 capsule by mouth at bedtime.     . metoprolol succinate (TOPROL XL) 25 MG 24 hr tablet Take 1 tablet (25 mg total) by mouth daily. 90 tablet 3  . Misc Natural Products (COMPLETE MENOPAUSE HEALTH PO) Take 1 Dose by mouth daily.     . Multiple Vitamin (MULTIVITAMIN) tablet Take 1 tablet by mouth daily.    Vladimir Faster Glycol-Propyl Glycol (SYSTANE OP) Place 1 drop into both eyes daily as needed. Dry eyes    . PREMARIN vaginal cream Place 0.5 g vaginally 2 (two) times a week.  4  . TURMERIC PO Take 1 tablet by mouth daily.    . Vitamin D, Ergocalciferol, (DRISDOL) 50000 units CAPS capsule Take 1 capsule (50,000 Units total) by mouth every 7 (seven) days. (Patient taking differently: Take 50,000 Units by mouth every 7 (seven) days. Fridays) 6 capsule 0   No current facility-administered medications on file prior to visit.     BP 107/67 (BP Location: Left Arm, Patient Position: Sitting, Cuff Size: Normal)   Pulse 73   Temp 98.1 F (36.7 C) (Oral)   Resp 14   Ht 5\' 4"  (1.626 m)   Wt 111 lb (50.3 kg)   LMP  (LMP Unknown)   SpO2 99%   BMI 19.05 kg/m       Objective:   Physical Exam  General  Mental Status - Alert. General Appearance - Well groomed. Not in acute distress.  Skin Rashes- No Rashes.  HEENT Head- Normal. Ear Auditory Canal - Left- Normal. Right - Normal.Tympanic Membrane- Left- Normal. Right- Normal. Eye Sclera/Conjunctiva- Left- Normal. Right- Normal. Nose & Sinuses Nasal Mucosa- Left-  Boggy and Congested. Right-  Boggy and  Congested.Bilateral maxillary and frontal sinus pressure. Mouth & Throat Lips: Upper Lip- Normal: no dryness, cracking, pallor, cyanosis, or vesicular eruption. Lower Lip-Normal: no dryness, cracking, pallor, cyanosis or vesicular eruption. Buccal Mucosa- Bilateral- No Aphthous ulcers. Oropharynx- No Discharge or Erythema. Tonsils: Characteristics- Bilateral- No Erythema or Congestion. Size/Enlargement- Bilateral- No  enlargement. Discharge- bilateral-None.  Neck Neck- Supple. No Masses.   Chest and Lung Exam Auscultation: Breath Sounds:-Clear even and unlabored.  Cardiovascular Auscultation:Rythm- Regular, rate and rhythm. Murmurs & Other Heart Sounds:Ausculatation of the heart reveal- No Murmurs.  Lymphatic Head & Neck General Head & Neck Lymphatics: Bilateral: Description- No Localized lymphadenopathy.      Assessment & Plan:   You appear to have bronchitis and sinusitis. Rest hydrate and tylenol for fever. I am prescribing cough medicine benzonatate, and doxycycline antibiotic. For your nasal congestion you could use flonase.  For wheezing rx albuterol  I would hold repatha today and update your cardiologist. Restart in 7-10 days when feels better.  Refilling colcrys today.  For insomnia dc  trazadone and try hydroxyzine.  Follow up in 7-10 days or as needed   Yvonne Petite, [name], [name]

## 2016-12-23 ENCOUNTER — Encounter: Payer: Self-pay | Admitting: Medical

## 2016-12-30 ENCOUNTER — Other Ambulatory Visit: Payer: Self-pay | Admitting: Medical

## 2017-01-14 ENCOUNTER — Other Ambulatory Visit: Payer: Self-pay | Admitting: Medical

## 2017-01-27 DIAGNOSIS — N8110 Cystocele, unspecified: Secondary | ICD-10-CM | POA: Diagnosis not present

## 2017-01-27 DIAGNOSIS — N816 Rectocele: Secondary | ICD-10-CM | POA: Diagnosis not present

## 2017-01-27 DIAGNOSIS — N898 Other specified noninflammatory disorders of vagina: Secondary | ICD-10-CM | POA: Diagnosis not present

## 2017-01-27 DIAGNOSIS — N760 Acute vaginitis: Secondary | ICD-10-CM | POA: Diagnosis not present

## 2017-05-19 ENCOUNTER — Telehealth: Payer: Self-pay | Admitting: Cardiovascular Disease

## 2017-05-19 NOTE — Telephone Encounter (Signed)
Closed Encounter  °

## 2017-05-20 ENCOUNTER — Encounter: Payer: Self-pay | Admitting: Cardiovascular Disease

## 2017-05-20 ENCOUNTER — Ambulatory Visit (INDEPENDENT_AMBULATORY_CARE_PROVIDER_SITE_OTHER): Payer: BLUE CROSS/BLUE SHIELD | Admitting: Cardiovascular Disease

## 2017-05-20 VITALS — BP 120/80 | HR 70 | Ht 64.0 in | Wt 112.6 lb

## 2017-05-20 DIAGNOSIS — R002 Palpitations: Secondary | ICD-10-CM

## 2017-05-20 DIAGNOSIS — I2510 Atherosclerotic heart disease of native coronary artery without angina pectoris: Secondary | ICD-10-CM | POA: Diagnosis not present

## 2017-05-20 DIAGNOSIS — I119 Hypertensive heart disease without heart failure: Secondary | ICD-10-CM | POA: Diagnosis not present

## 2017-05-20 MED ORDER — METOPROLOL SUCCINATE ER 25 MG PO TB24: 25.0000 mg | ORAL_TABLET | Freq: Every day | ORAL | 3 refills | Status: DC

## 2017-05-20 MED ORDER — CLOPIDOGREL BISULFATE 75 MG PO TABS: 75.0000 mg | ORAL_TABLET | Freq: Every day | ORAL | 3 refills | Status: DC

## 2017-05-20 NOTE — Patient Instructions (Signed)

## 2017-05-20 NOTE — Progress Notes (Signed)
Cardiology Office Note   Date:  05/20/2017   ID:  Rafia Shedden, DOB [birth year], MRN [id number]  PCP:  Mackie Pai, PA-C  Cardiologist:   Skeet Latch, MD   No chief complaint on file.    History of Present Illness: Katrina Walters is a 61 y.o. female with obstructive CAD (medically managed), shortness of breath, and hyperlipidemia and who presents for follow up.  Katrina Walters was seen in clinic 06/13/16 with exertional shortness of breath.  She had a known coronary calcium score of 376 in 2015 (98% for age and gender).  She was referred for stress testing 11/2014 but was unable to complete it due to cost.  She was able to perform an ETT 06/27/16 that was notable for 58mm ST depressions inferiorly.  She was referred for [facility] 07/17/16 that revealed LVEF 65% with 100% distal RCA, 50% mid RCA, 40% ostial LM, 50% mi d LAD, 70% D1 lesions.  Her symptoms were felt to be attributable to her chronically occluded RCA which had left to right collaterals.  Medical management was recommended with consideration for CABG if her symptoms persist.  She was started on rosuvastatin but did not tolerate due to myalgias. This was switched to atorvastatin.  However this was stopped due to a rash and swelling. She was then referred to our pharmacist and started on Repatha.  Katrina Walters has been doing well. She is back on a healthy diet. She has been trying to find healthy snacks and no longer needs lots of sweets. Her husband brought her an elliptical treadmill. She exercises on them for 30-40 minutes every day.  She has no chest pain or shortness of breath with this activity. She also denies orthopnea or PND. She sometimes has lower extremity edema if she walks a lot but it improves with elevation of her legs. She has been getting a lot of stress lately. Her sons go out of town for extended periods of time and leave her to take care of her dogs were quite large.  This is very stressful and she sometimes has panic attacks.       Past Medical History:  Diagnosis Date  . Eating disorder   . Genital warts   . Hyperlipidemia   . 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 BREAST REDUCTION SURGERY  2006  . CARDIAC CATHETERIZATION N/A 07/17/2016   Procedure: Left Heart Cath and Coronary Angiography;  Surgeon: Burnell Blanks, MD;  Location: [facility];  Service: Cardiovascular;  Laterality: N/A;     Current Outpatient Prescriptions  Medication Sig Dispense Refill  . albuterol (PROVENTIL HFA;VENTOLIN HFA) 108 (90 Base) MCG/ACT inhaler Inhale 2 puffs into the lungs every 6 (six) hours as needed for wheezing or shortness of breath. 1 Inhaler 0  . alendronate (FOSAMAX) 70 MG tablet Take 1 tablet (70 mg total) by mouth once a week. Take with a full glass of water on an empty stomach. 12 tablet 3  . benzonatate (TESSALON) 100 MG capsule Take 1 capsule (100 mg total) by mouth 3 (three) times daily as needed for cough. 21 capsule 0  . ciprofloxacin (CIPRO) 250 MG tablet Take 1 tablet (250 mg total) by mouth 2 (two) times daily. 6 tablet 0  . clopidogrel (PLAVIX) 75 MG tablet Take 1 tablet (75 mg total) by mouth daily. 90 tablet 3  . colchicine 0.6 MG tablet Take 1 tablet (0.6 mg total) by mouth 2 (two)  times daily. 60 tablet 3  . COLCHICINE PO Take 1 tablet by mouth daily. Take 1mg  daily    . doxycycline (VIBRA-TABS) 100 MG tablet Take 1 tablet (100 mg total) by mouth 2 (two) times daily. Can give caps or generic 20 tablet 0  . Evolocumab (REPATHA SURECLICK) 782 MG/ML SOAJ Inject 140 mg into the skin every 14 (fourteen) days. 2 pen 11  . fluticasone (FLONASE) 50 MCG/ACT nasal spray Place 2 sprays into both nostrils daily. 16 g 1  . hydrOXYzine (ATARAX/VISTARIL) 10 MG tablet TAKE 1 TO 2 TABLETS BY MOUTH AT BEDTIME AS NEEDED INSOMNIA 30 tablet 0  . Melatonin 3 MG CAPS Take 1 capsule by mouth at bedtime.     . metoprolol succinate (TOPROL XL) 25 MG 24 hr tablet  Take 1 tablet (25 mg total) by mouth daily. 90 tablet 3  . Misc Natural Products (COMPLETE MENOPAUSE HEALTH PO) Take 1 Dose by mouth daily.     . Multiple Vitamin (MULTIVITAMIN) tablet Take 1 tablet by mouth daily.    Katrina Walters (SYSTANE OP) Place 1 drop into both eyes daily as needed. Dry eyes    . PREMARIN vaginal cream Place 0.5 g vaginally 2 (two) times a week.  4  . TURMERIC PO Take 1 tablet by mouth daily.    . Vitamin D, Ergocalciferol, (DRISDOL) 50000 units CAPS capsule Take 1 capsule (50,000 Units total) by mouth every 7 (seven) days. (Patient taking differently: Take 50,000 Units by mouth every 7 (seven) days. Fridays) 6 capsule 0   No current facility-administered medications for this visit.     Allergies:   Amoxicillin; Aspirin; Atorvastatin; and Crestor [rosuvastatin calcium]    Social History:  The patient  reports that she has never smoked. She has never used smokeless tobacco. She reports that she does not drink alcohol or use drugs.   Family History:  The patient's family history includes Cancer in her paternal aunt; Heart attack in her father and mother; Heart disease in her father and mother; Hypertension in her mother.    ROS:  Please see the history of present illness.   Otherwise, review of systems are positive for none.   All other systems are reviewed and negative.    PHYSICAL EXAM: VS:  BP 120/80   Pulse 70   Ht 5\' 4"  (1.626 m)   Wt 51.1 kg (112 lb 9.6 oz)   LMP  (LMP Unknown)   BMI 19.33 kg/m  , BMI Body mass index is 19.33 kg/m. GENERAL:  Well appearing HEENT: Pupils equal round and reactive, fundi not visualized, oral mucosa unremarkable NECK:  No jugular venous distention, waveform within normal limits, carotid upstroke brisk and symmetric, no bruits, no thyromegaly LUNGS:  Clear to auscultation bilaterally HEART:  RRR.  PMI not displaced or sustained,S1 and S2 within normal limits, no S3, no S4, no clicks, no rubs, no  murmurs ABD:  Flat, positive bowel sounds normal in frequency in pitch, no bruits, no rebound, no guarding, no midline pulsatile mass, no hepatomegaly, no splenomegaly EXT:  2 plus pulses throughout, no edema, no cyanosis no clubbing SKIN:  No rashes no nodules NEURO:  Cranial nerves II through XII grossly intact, motor grossly intact throughout PSYCH:  Cognitively intact, oriented to person place and time   EKG:  EKG is ordered today. The ekg ordered 05/15/16 demonstrates sinus bradycardia. Rate 59 bpm. 05/20/17: Sinus rhythm.  Rate 70 bpm.    ETT 06/27/16:  Blood pressure demonstrated  a normal response to exercise.  No T wave inversion was noted during stress.  Horizontal ST segment depression ST segment depression of 2 mm was noted during stress in the II, III and aVF leads, and returning to baseline after less than 1 minute of recovery.  LHC 07/17/16:  The left ventricular systolic function is normal.  LV end diastolic pressure is normal.  The left ventricular ejection fraction is greater than 65% by visual estimate.  There is no mitral valve regurgitation.  Prox RCA lesion, 20 %stenosed.  Mid RCA lesion, 50 %stenosed.  Dist RCA lesion, 100 %stenosed.  Ost LM lesion, 40 %stenosed.  Ost LM to LM lesion, 40 %stenosed.  Mid LAD lesion, 50 %stenosed.  1st Diag lesion, 70 %stenosed.  Ost 1st Mrg to 1st Mrg lesion, 40 %stenosed.  Ost LAD to Prox LAD lesion, 20 %stenosed.  Prox RCA to Mid RCA lesion, 20 %stenosed.   1. Triple vessel CAD  2. Mild to moderate stenosis left main artery. This appears to be eccentric and does not appear to be flow limiting.  3. Moderate stenosis mid LAD. The small caliber diagonal branch has moderately severe stenosis.  4. Mild to moderate stenosis in the obtuse marginal branch of the Circumflex 5. The RCA is a large dominant vessel with moderate distal stenosis prior to total occlusion of the vessel at the distal bifurcation. The  posterolateral artery and the posterior descending artery fills from left to right collaterals.  6. Normal LV systolic function  Recommendations: Her abnormal stress test is likely due to the chronic occlusion of the distal RCA. She has mild to moderate stenosis in the left main artery as well as moderate stenosis in the LAD. I think the best plan for now is medical management of her CAD. If she fails medical management, will have to consider CABG.    Recent Labs: 07/14/2016: Hemoglobin 14.8; Platelets 232 12/18/2016: ALT 12; BUN 14; Creatinine, Ser 0.78; Potassium 4.6; Sodium 138   Lipid Panel    Component Value Date/Time   CHOL 100 12/18/2016 1152   TRIG 129.0 12/18/2016 1152   HDL 26.20 (L) 12/18/2016 1152   CHOLHDL 4 12/18/2016 1152   VLDL 25.8 12/18/2016 1152   LDLCALC 48 12/18/2016 1152      Wt Readings from Last 3 Encounters:  05/20/17 51.1 kg (112 lb 9.6 oz)  12/18/16 50.3 kg (111 lb)  11/21/16 52.9 kg (116 lb 9.6 oz)      ASSESSMENT AND PLAN:  # 3 vessel CAD: # Shortness of breath: # Hyperlipidemia: Katrina Walters to do well on medical management of her three-vessel CAD. She has no angina. Her LDL was 48 on 12/2016 and she is tolerating Repatha well.  Continue clopidogrel and metoprolol.  She has an aspirin allergy.  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  # Palpitations: Stable on metoprolol.   Current medicines are reviewed at length with the patient today.  The patient does not have concerns regarding medicines.  The following changes have been made:  Stop aspirin and start carvedilol 75 mg daily.  Labs/ tests ordered today include:   No orders of the defined types were placed in this encounter.    Disposition:   FU with Giuseppina Quinones C. Oval Linsey, MD, [facility] in 6 months.     This note was written with the assistance of speech recognition software.  Please excuse any  transcriptional errors.  Signed, Jo-Ann Johanning  Joya Gaskins, MD, [facility]  05/20/2017 10:26 AM    [place]

## 2017-06-08 ENCOUNTER — Telehealth: Payer: Self-pay | Admitting: Cardiovascular Disease

## 2017-06-08 MED ORDER — TICAGRELOR 90 MG PO TABS: 90.0000 mg | ORAL_TABLET | Freq: Two times a day (BID) | ORAL | 5 refills | Status: DC

## 2017-06-08 NOTE — Telephone Encounter (Signed)
Spoke with pt she states that she is allergic to gen. plavix she itches uncontrollably she states that stopped medication 2 days ago, she also has the same reaction to ASA. Needs new rx ASAP since she stopped 2 days ago

## 2017-06-08 NOTE — Telephone Encounter (Signed)
Pt notified new rx sent to preferred pharmacy as requested. Allergy added to EPIC  Pt will call back with any new reaction to new medication

## 2017-06-08 NOTE — Telephone Encounter (Signed)
Pt says she is allergic to the Clopidogrel,she stopped taking it. She can not take Aspirin or Clopidogrel,what can she take please?

## 2017-06-08 NOTE — Telephone Encounter (Signed)
It is odd for these two medications that are urelated to cause itching.  She can try ticagrelor 90mg  bid.

## 2017-07-16 ENCOUNTER — Telehealth: Payer: Self-pay | Admitting: Cardiovascular Disease

## 2017-07-16 NOTE — Telephone Encounter (Signed)
New message  Pt c/o medication issue:  1. Name of Medication: repatha   2. How are you currently taking this medication (dosage and times per day)?   3. Are you having a reaction (difficulty breathing--STAT)? Reaction itching   4. What is your medication issue? Pt would like to speak with RN about the allergic reaction to the medication .please call back to discuss

## 2017-07-20 NOTE — Telephone Encounter (Signed)
LMOM for patient to talk about itching from [place].  Of note, patient called in October because of same sx.  Believed it was from clopidogrel - patient reported that she also had this reaction to ASA.   Clopidogrel was switched to ticagrelor.   Need to determine if itching every stopped.

## 2017-07-22 NOTE — Telephone Encounter (Signed)
Spoke with patient - she will hold next dose of repatha to see if itching goes away.  If so, she will re-challenge a few weeks later to see it itching resumes.  Patient to call after re-challenge.

## 2017-09-04 ENCOUNTER — Telehealth: Payer: Self-pay | Admitting: Pharmacist Clinician (PhC)/ Clinical Pharmacy Specialist

## 2017-09-04 NOTE — Telephone Encounter (Signed)
Called patient about previously holding Repatha because of itching.   Patient reports itching resolved after being off [place].   She believes she has a latex allergy.     Will give her samples of Praluent 150 mg x 2 to see if she can tolerate.  If so will send request to switch medications

## 2017-09-12 DIAGNOSIS — S39012A Strain of muscle, fascia and tendon of lower back, initial encounter: Secondary | ICD-10-CM | POA: Diagnosis not present

## 2017-09-24 ENCOUNTER — Ambulatory Visit (INDEPENDENT_AMBULATORY_CARE_PROVIDER_SITE_OTHER): Payer: BLUE CROSS/BLUE SHIELD | Admitting: Medical

## 2017-09-24 VITALS — BP 114/73 | HR 60 | Temp 97.8°F | Resp 18 | Ht 64.0 in | Wt 112.0 lb

## 2017-09-24 DIAGNOSIS — Z0000 Encounter for general adult medical examination without abnormal findings: Secondary | ICD-10-CM

## 2017-09-24 DIAGNOSIS — J3489 Other specified disorders of nose and nasal sinuses: Secondary | ICD-10-CM

## 2017-09-24 DIAGNOSIS — J0100 Acute maxillary sinusitis, unspecified: Secondary | ICD-10-CM

## 2017-09-24 DIAGNOSIS — R82998 Other abnormal findings in urine: Secondary | ICD-10-CM | POA: Diagnosis not present

## 2017-09-24 DIAGNOSIS — Z113 Encounter for screening for infections with a predominantly sexual mode of transmission: Secondary | ICD-10-CM | POA: Diagnosis not present

## 2017-09-24 LAB — LIPID PANEL
Cholesterol: 111 mg/dL (ref 0–200)
HDL: 27.3 mg/dL — ABNORMAL LOW (ref >39.00)
LDL Cholesterol: 57 mg/dL (ref 0–99)
NonHDL: 84
Total CHOL/HDL Ratio: 4
Triglycerides: 136 mg/dL (ref 0.0–149.0)
VLDL: 27.2 mg/dL (ref 0.0–40.0)

## 2017-09-24 LAB — CBC WITH DIFFERENTIAL/PLATELET
Basophils Absolute: 0.1 10*3/uL (ref 0.0–0.1)
Basophils Relative: 0.8 % (ref 0.0–3.0)
Eosinophils Absolute: 0.4 10*3/uL (ref 0.0–0.7)
Eosinophils Relative: 5.1 % — ABNORMAL HIGH (ref 0.0–5.0)
HCT: 43.2 % (ref 36.0–46.0)
Hemoglobin: 14.1 g/dL (ref 12.0–15.0)
Lymphocytes Relative: 26.4 % (ref 12.0–46.0)
Lymphs Abs: 2 10*3/uL (ref 0.7–4.0)
MCHC: 32.6 g/dL (ref 30.0–36.0)
MCV: 89 fl (ref 78.0–100.0)
Monocytes Absolute: 0.4 10*3/uL (ref 0.1–1.0)
Monocytes Relative: 5.8 % (ref 3.0–12.0)
Neutro Abs: 4.6 10*3/uL (ref 1.4–7.7)
Neutrophils Relative %: 61.9 % (ref 43.0–77.0)
Platelets: 307 10*3/uL (ref 150.0–400.0)
RBC: 4.85 Mil/uL (ref 3.87–5.11)
RDW: 16.8 % — ABNORMAL HIGH (ref 11.5–15.5)
WBC: 7.5 10*3/uL (ref 4.0–10.5)

## 2017-09-24 LAB — POC URINALSYSI DIPSTICK (AUTOMATED)
Bilirubin, UA: NEGATIVE
Blood, UA: NEGATIVE
Glucose, UA: NEGATIVE
Ketones, UA: NEGATIVE
Nitrite, UA: NEGATIVE
Protein, UA: NEGATIVE
Spec Grav, UA: <=1.005 — AB (ref 1.010–1.025)
Urobilinogen, UA: 0.2 E.U./dL
pH, UA: 6 (ref 5.0–8.0)

## 2017-09-24 LAB — COMPREHENSIVE METABOLIC PANEL
ALT: 9 U/L (ref 0–35)
AST: 15 U/L (ref 0–37)
Albumin: 3.9 g/dL (ref 3.5–5.2)
Alkaline Phosphatase: 50 U/L (ref 39–117)
BUN: 13 mg/dL (ref 6–23)
CO2: 32 mEq/L (ref 19–32)
Calcium: 9.3 mg/dL (ref 8.4–10.5)
Chloride: 103 mEq/L (ref 96–112)
Creatinine, Ser: 0.71 mg/dL (ref 0.40–1.20)
GFR: 89.07 mL/min (ref >60.00)
Glucose, Bld: 83 mg/dL (ref 70–99)
Potassium: 4.9 mEq/L (ref 3.5–5.1)
Sodium: 139 mEq/L (ref 135–145)
Total Bilirubin: 0.5 mg/dL (ref 0.2–1.2)
Total Protein: 6.9 g/dL (ref 6.0–8.3)

## 2017-09-24 MED ORDER — DOXYCYCLINE HYCLATE 100 MG PO TABS: 100.0000 mg | ORAL_TABLET | Freq: Two times a day (BID) | ORAL | 0 refills | Status: DC

## 2017-09-24 MED ORDER — FLUTICASONE PROPIONATE 50 MCG/ACT NA SUSP: 2.0000 | Freq: Every day | NASAL | 1 refills | Status: DC

## 2017-09-24 NOTE — Progress Notes (Signed)
Subjective:    Patient ID: Katrina Walters, female    DOB: Apr 29, 1956, 62 y.o.   MRN: [id number]  HPI  Pt in for CPE.  Pt is fasting.  Pt is ok with getting hiv screen.   Pt declines flu vaccine.  Pt update me had to stop repatha. She had itching. They tried other injection med for her cholesterol but that made her itch as well. Pt is willing to take limited statin and only 3 days a week.   Pt also states could not tolerate fosamax.  GI upset with Fosamax.  Pt states last pap less than a year ago. Pt is being followed by gyn.   Review of Systems  Constitutional: Negative for chills, fatigue and fever.  HENT: Positive for congestion, sinus pressure and sinus pain.        Blowing out some colored mucus.  Signs and symptoms for more than a week. Not getting better.  Respiratory: Negative for cough, chest tightness, shortness of breath and wheezing.   Cardiovascular: Negative for chest pain and palpitations.  Gastrointestinal: Negative for abdominal pain.  Genitourinary: Negative for dysuria, flank pain and frequency.  Musculoskeletal: Negative for back pain, myalgias and neck pain.  Skin: Negative for rash.  Neurological: Negative for dizziness, syncope, weakness and headaches.  Hematological: Negative for adenopathy. Does not bruise/bleed easily.  Psychiatric/Behavioral: Negative for behavioral problems and confusion.    Past Medical History:  Diagnosis Date  . Eating disorder   . Genital warts   . Hyperlipidemia   . 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 Marital status: Married    Spouse name: Not on file  . Number of children: Not on file  . Years of education: Not on file  . Highest education level: Not on file  Social Needs  . Financial resource strain: Not on file  . Food insecurity - worry: Not on file  . Food insecurity - inability: Not on file  . Transportation needs - medical: Not on file  . Transportation needs -  non-medical: Not on file  Occupational History  . Occupation: unemployed at the time  Tobacco Use  . Smoking status: Never Smoker  . Smokeless tobacco: Never Used  Substance and Sexual Activity  . Alcohol use: No    Alcohol/week: 0.0 oz  . Drug use: No  . Sexual activity: Not on file  Other Topics Concern  . Not on file  Social History Narrative  . Not on file    Past Surgical History:  Procedure Laterality Date  . BREAST REDUCTION SURGERY  2006  . CARDIAC CATHETERIZATION N/A 07/17/2016   Procedure: Left Heart Cath and Coronary Angiography;  Surgeon: Burnell Blanks, MD;  Location: [place] [facility];  Service: Cardiovascular;  Laterality: N/A;    Family History  Problem Relation Age of Onset  . Heart disease Mother        died at 75 of heart attack  . Hypertension Mother   . Heart attack Mother   . Heart disease Father        had heart attack at age 22  . Heart attack Father   . Cancer Paternal Aunt     Allergies  Allergen Reactions  . Amoxicillin     Had fever with flu like symptoms. Has patient had a PCN reaction causing immediate rash, facial/tongue/throat swelling, SOB or lightheadedness with hypotension: no Has patient had a PCN reaction causing severe rash involving  mucus membranes or skin necrosis: no Has patient had a PCN reaction that required hospitalization no Has patient had a PCN reaction occurring within the last 10 years: yes If all of the above answers are "NO", then may proceed with Cephalosporin use.  [name] Clopidogrel & Aspirin Itching  . Aspirin Itching  . Atorvastatin     Myalgia  . Crestor [Rosuvastatin Calcium]     MYALGIA     Current Outpatient Medications on File Prior to Visit  Medication Sig Dispense Refill  . albuterol (PROVENTIL HFA;VENTOLIN HFA) 108 (90 Base) MCG/ACT inhaler Inhale 2 puffs into the lungs every 6 (six) hours as needed for wheezing or shortness of breath. 1 Inhaler 0  . colchicine 0.6 MG tablet Take 1 tablet (0.6  mg total) by mouth 2 (two) times daily. 60 tablet 3  . COLCHICINE PO Take 1 tablet by mouth daily. Take 1mg  daily    . diclofenac (VOLTAREN) 75 MG EC tablet Take 75 mg by mouth 2 (two) times daily as needed.  0  . Evolocumab (REPATHA SURECLICK) 195 MG/ML SOAJ Inject 140 mg into the skin every 14 (fourteen) days. 2 pen 11  . fluticasone (FLONASE) 50 MCG/ACT nasal spray Place 2 sprays into both nostrils daily. 16 g 1  . hydrOXYzine (ATARAX/VISTARIL) 10 MG tablet TAKE 1 TO 2 TABLETS BY MOUTH AT BEDTIME AS NEEDED INSOMNIA 30 tablet 0  . Melatonin 3 MG CAPS Take 1 capsule by mouth at bedtime.     . methocarbamol (ROBAXIN) 500 MG tablet TAKE 2 TABLETS BY MOUTH TWICE A DAY AS NEEDED  0  . metoprolol succinate (TOPROL XL) 25 MG 24 hr tablet Take 1 tablet (25 mg total) by mouth daily. 90 tablet 3  . Misc Natural Products (COMPLETE MENOPAUSE HEALTH PO) Take 1 Dose by mouth daily.     . Multiple Vitamin (MULTIVITAMIN) tablet Take 1 tablet by mouth daily.    Vladimir Faster Glycol-Propyl Glycol (SYSTANE OP) Place 1 drop into both eyes daily as needed. Dry eyes    . PREMARIN vaginal cream Place 0.5 g vaginally 2 (two) times a week.  4  . ticagrelor (BRILINTA) 90 MG TABS tablet Take 1 tablet (90 mg total) by mouth 2 (two) times daily. 60 tablet 5  . TURMERIC PO Take 1 tablet by mouth daily.    . Vitamin D, Ergocalciferol, (DRISDOL) 50000 units CAPS capsule Take 1 capsule (50,000 Units total) by mouth every 7 (seven) days. (Patient taking differently: Take 50,000 Units by mouth every 7 (seven) days. Fridays) 6 capsule 0  . alendronate (FOSAMAX) 70 MG tablet Take 1 tablet (70 mg total) by mouth once a week. Take with a full glass of water on an empty stomach. (Patient not taking: Reported on 09/24/2017) 12 tablet 3   No current facility-administered medications on file prior to visit.     BP 114/73 (BP Location: Right Arm, Patient Position: Sitting, Cuff Size: Normal)   Pulse 60   Temp 97.8 F (36.6 C) (Oral)    Resp 18   Ht 5\' 4"  (1.626 m)   Wt 112 lb (50.8 kg)   LMP  (LMP Unknown)   SpO2 100%   BMI 19.22 kg/m       Objective:   Physical Exam  General Mental Status- Alert. General Appearance- Not in acute distress.   Skin General: Color- Normal Color. Moisture- Normal Moisture.  Small tiny moles on her back.  None of them look suspicious.  Neck Carotid Arteries- Normal color.  Moisture- Normal Moisture. No carotid bruits. No JVD.  Chest and Lung Exam Auscultation: Breath Sounds:-Normal.  Cardiovascular Auscultation:Rythm- Regular. Murmurs & Other Heart Sounds:Auscultation of the heart reveals- No Murmurs.  Abdomen Inspection:-Inspeection Normal. Palpation/Percussion:Note:No mass. Palpation and Percussion of the abdomen reveal- Non Tender, Non Distended + BS, no rebound or guarding.    Neurologic Cranial Nerve exam:- CN III-XII intact(No nystagmus), symmetric smile. Strength:- 5/5 equal and symmetric strength both upper and lower extremities.   HEENT Head- Normal. Ear Auditory Canal - Left- Normal. Right - Normal.Tympanic Membrane- Left- Normal. Right- Normal. Eye Sclera/Conjunctiva- Left- Normal. Right- Normal. Nose & Sinuses Nasal Mucosa- Left-  Boggy and Congested. Right-  Boggy and  Congested.Bilateral maxillary and frontal sinus pressure. Mouth & Throat Lips: Upper Lip- Normal: no dryness, cracking, pallor, cyanosis, or vesicular eruption. Lower Lip-Normal: no dryness, cracking, pallor, cyanosis or vesicular eruption. Buccal Mucosa- Bilateral- No Aphthous ulcers. Oropharynx- No Discharge or Erythema. Tonsils: Characteristics- Bilateral- No Erythema or Congestion. Size/Enlargement- Bilateral- No enlargement. Discharge- bilateral-None.         Assessment & Plan:  For you wellness exam today I have ordered cbc, cmp, lipid panel, ua and hiv.  Flu vaccine declined.  Recommend exercise and healthy diet.  We will let you know lab results as they come in.  For  probable sinus infection, I am prescribing a azithromycin antibiotic and Flonase.  I want you to touch base with your cardiologist and see if he would agree to low-dose statin 3 times a week.  For your intolerance to Fosamax, I think we could try to arrange for you to get Prolia.  Hopefully your insurance would authorize that.  If you could touch base with them and also call back within 10 days to help remind me.  I plan to touch base with our staff and see if we can get that arranged for you.  Follow up date appointment will be determined after lab review.   Angeletta Goelz, [name], PA-C

## 2017-09-24 NOTE — Patient Instructions (Addendum)
For you wellness exam today I have ordered cbc, cmp, lipid panel, ua and hiv.  Flu vaccine declined.  Recommend exercise and healthy diet.  We will let you know lab results as they come in.  For probable sinus infection, I am prescribing a azithromycin antibiotic and Flonase.  I want you to touch base with your cardiologist and see if he would agree to low-dose statin 3 times a week.  For your intolerance to Fosamax, I think we could try to arrange for you to get Prolia.  Hopefully your insurance would authorize that.  If you could touch base with them and also call back within 10 days to help remind me.  I plan to touch base with our staff and see if we can get that arranged for you.  Follow up date appointment will be determined after lab review.    Preventive Care 40-64 Years, Female Preventive care refers to lifestyle choices and visits with your health care provider that can promote health and wellness. What does preventive care include?  A yearly physical exam. This is also called an annual well check.  Dental exams once or twice a year.  Routine eye exams. Ask your health care provider how often you should have your eyes checked.  Personal lifestyle choices, including: ? Daily care of your teeth and gums. ? Regular physical activity. ? Eating a healthy diet. ? Avoiding tobacco and drug use. ? Limiting alcohol use. ? Practicing safe sex. ? Taking low-dose aspirin daily starting at age 60. ? Taking vitamin and mineral supplements as recommended by your health care provider. What happens during an annual well check? The services and screenings done by your health care provider during your annual well check will depend on your age, overall health, lifestyle risk factors, and family history of disease. Counseling Your health care provider may ask you questions about your:  Alcohol use.  Tobacco use.  Drug use.  Emotional well-being.  Home and relationship  well-being.  Sexual activity.  Eating habits.  Work and work Statistician.  Method of birth control.  Menstrual cycle.  Pregnancy history.  Screening You may have the following tests or measurements:  Height, weight, and BMI.  Blood pressure.  Lipid and cholesterol levels. These may be checked every 5 years, or more frequently if you are over 71 years old.  Skin check.  Lung cancer screening. You may have this screening every year starting at age 49 if you have a 30-pack-year history of smoking and currently smoke or have quit within the past 15 years.  Fecal occult blood test (FOBT) of the stool. You may have this test every year starting at age 17.  Flexible sigmoidoscopy or colonoscopy. You may have a sigmoidoscopy every 5 years or a colonoscopy every 10 years starting at age 37.  Hepatitis C blood test.  Hepatitis B blood test.  Sexually transmitted disease (STD) testing.  Diabetes screening. This is done by checking your blood sugar (glucose) after you have not eaten for a while (fasting). You may have this done every 1-3 years.  Mammogram. This may be done every 1-2 years. Talk to your health care provider about when you should start having regular mammograms. This may depend on whether you have a family history of breast cancer.  BRCA-related cancer screening. This may be done if you have a family history of breast, ovarian, tubal, or peritoneal cancers.  Pelvic exam and Pap test. This may be done every 3 years starting at  age 13. Starting at age 38, this may be done every 5 years if you have a Pap test in combination with an HPV test.  Bone density scan. This is done to screen for osteoporosis. You may have this scan if you are at high risk for osteoporosis.  Discuss your test results, treatment options, and if necessary, the need for more tests with your health care provider. Vaccines Your health care provider may recommend certain vaccines, such  as:  Influenza vaccine. This is recommended every year.  Tetanus, diphtheria, and acellular pertussis (Tdap, Td) vaccine. You may need a Td booster every 10 years.  Varicella vaccine. You may need this if you have not been vaccinated.  Zoster vaccine. You may need this after age 52.  Measles, mumps, and rubella (MMR) vaccine. You may need at least one dose of MMR if you were born in 1957 or later. You may also need a second dose.  Pneumococcal 13-valent conjugate (PCV13) vaccine. You may need this if you have certain conditions and were not previously vaccinated.  Pneumococcal polysaccharide (PPSV23) vaccine. You may need one or two doses if you smoke cigarettes or if you have certain conditions.  Meningococcal vaccine. You may need this if you have certain conditions.  Hepatitis A vaccine. You may need this if you have certain conditions or if you travel or work in places where you may be exposed to hepatitis A.  Hepatitis B vaccine. You may need this if you have certain conditions or if you travel or work in places where you may be exposed to hepatitis B.  Haemophilus influenzae type b (Hib) vaccine. You may need this if you have certain conditions.  Talk to your health care provider about which screenings and vaccines you need and how often you need them. This information is not intended to replace advice given to you by your health care provider. Make sure you discuss any questions you have with your health care provider. Document Released: 09/21/2015 Document Revised: 05/14/2016 Document Reviewed: 06/26/2015 Elsevier Interactive Patient Education  [employer].

## 2017-09-25 LAB — URINE CULTURE
MICRO NUMBER:: 90072440
SPECIMEN QUALITY:: ADEQUATE

## 2017-09-25 LAB — HIV ANTIBODY (ROUTINE TESTING W REFLEX): HIV 1&2 Ab, 4th Generation: NONREACTIVE

## 2017-10-01 ENCOUNTER — Telehealth: Payer: Self-pay | Admitting: Medical

## 2017-10-01 NOTE — Telephone Encounter (Signed)
Please advise 

## 2017-10-01 NOTE — Telephone Encounter (Signed)
Copied from [address]. Topic: Quick Communication - See Telephone Encounter >> Oct 01, 2017  9:31 AM Ivar Drape wrote: CRM for notification. See Telephone encounter for:  10/01/17. Patient says her back pain is getting worse.  The pain is now shooting pain to her left hip and down her leg.  She is taking muscle relaxers and anti inflamatories but they are not working.  She wants to know if she can be prescribed Lyrica 150mg  because that's the only thing that has helped her in the pass. Please send the prescription to [facility] on [address].

## 2017-10-01 NOTE — Telephone Encounter (Signed)
Lyrica is a controlled medication and I really do need to see her in the office to discuss this pain in more detail.  In addition sometimes we often get prior off request or denials for Lyrica and I need to have details of pain/history documented.  I saw her for physical exam and we discussed sinus symptoms but I do not remember her mentioning back pain.  So can she get scheduled for tomorrow or maybe Monday.  Try to give her/offer her early morning appointment I did not or 8-9 a.m. Or 1-2 pm.

## 2017-10-02 ENCOUNTER — Encounter: Payer: Self-pay | Admitting: Medical

## 2017-10-02 ENCOUNTER — Ambulatory Visit: Payer: BLUE CROSS/BLUE SHIELD | Admitting: Medical

## 2017-10-02 VITALS — BP 116/64 | HR 61 | Temp 98.0°F | Resp 16 | Ht 64.0 in | Wt 113.8 lb

## 2017-10-02 DIAGNOSIS — M5442 Lumbago with sciatica, left side: Secondary | ICD-10-CM

## 2017-10-02 MED ORDER — PREDNISONE 10 MG PO TABS: ORAL_TABLET | ORAL | 0 refills | Status: DC

## 2017-10-02 MED ORDER — TRAMADOL HCL 50 MG PO TABS: 50.0000 mg | ORAL_TABLET | Freq: Three times a day (TID) | ORAL | 0 refills | Status: DC | PRN

## 2017-10-02 NOTE — Patient Instructions (Addendum)
For your back pain with radiating features providing I think you have sciatica.  The pain is severe enough that I am considering ordering an MRI.  However I think it might be a little too early since pain is only present for 3 weeks.  You had lumbar x-ray done at urgent care 3 weeks ago and I would ask you to sign release of information form and call us back on Monday with name and number of the clinic so we can get the radiology report.  Presently I am prescribing prednisone taper course since she did not respond well to diclofenac.  Please stop diclofenac and stop Robaxin.(Stop Robaxin since excess sedation.)  I discussed the computer caution regarding tramadol and your prior reported possible allergy to Plavix and aspirin.  Our pharmacist thinks this is probably not an accurate caution.  I went ahead and wrote the prescription.  If you have any adverse signs or symptoms/side effects with tramadol stop it and let us know.  We will see how you respond to the above and might consider PT or ordering MRI.  If your back pain subsides significantly then can try to start back exercises.  If any back pain that is severe with extreme leg weakness, foot drop or loss of bladder or bowel function then be seen at ED.  Follow-up in 7 days or as needed.

## 2017-10-02 NOTE — Progress Notes (Signed)
Subjective:    Patient ID: Katrina Walters, female    DOB: August 04, 1956, 62 y.o.   MRN: [id number]  HPI  Pt in with some lower back pain x 3 weeks. She has some pain that is shooting to her  Left buttock and left thigh. She sleeps 2 hours and then pain worsens/wakes her up. No fall or injury. She is not sure how the pain started.  Sometimes her left leg feels weak and transient numb.  No bowl or bladder incontinence.  A lot work at employment and a lot house work as well.  Pt in past states years ago had lyrica.  Not clear if this was written for similar episode or not.  Pt states about one month ago had xray at urgent care.  I do not have that report and patient is not really sure what it showed.  Also he does not remember which urgent care it was.  She was given diclofenac and robaxin. She needs pain to be able to sleep. She feels drowsy with medication. But can function.  Seen by urgent care about 3 weeks ago. Did xray of her lumbar spine.    Review of Systems  Constitutional: Negative for chills and fatigue.  Respiratory: Negative for cough, chest tightness, shortness of breath and wheezing.   Cardiovascular: Negative for chest pain and palpitations.  Gastrointestinal: Negative for abdominal pain, diarrhea and nausea.  Genitourinary: Negative for difficulty urinating, dysuria, frequency, hematuria and urgency.  Musculoskeletal: Positive for back pain. Negative for myalgias, neck pain and neck stiffness.  Skin: Negative for rash.  Neurological: Negative for weakness.       Some sciatica/radicular type features.  Hematological: Negative for adenopathy. Does not bruise/bleed easily.  Psychiatric/Behavioral: Negative for behavioral problems, confusion, decreased concentration, sleep disturbance and suicidal ideas. The patient is not nervous/anxious.    Past Medical History:  Diagnosis Date  . Eating disorder   . Genital warts   . Hyperlipidemia   . 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 Marital status: Married    Spouse name: Not on file  . Number of children: Not on file  . Years of education: Not on file  . Highest education level: Not on file  Social Needs  . Financial resource strain: Not on file  . Food insecurity - worry: Not on file  . Food insecurity - inability: Not on file  . Transportation needs - medical: Not on file  . Transportation needs - non-medical: Not on file  Occupational History  . Occupation: unemployed at the time  Tobacco Use  . Smoking status: Never Smoker  . Smokeless tobacco: Never Used  Substance and Sexual Activity  . Alcohol use: No    Alcohol/week: 0.0 oz  . Drug use: No  . Sexual activity: Not on file  Other Topics Concern  . Not on file  Social History Narrative  . Not on file    Past Surgical History:  Procedure Laterality Date  . BREAST REDUCTION SURGERY  2006  . CARDIAC CATHETERIZATION N/A 07/17/2016   Procedure: Left Heart Cath and Coronary Angiography;  Surgeon: Burnell Blanks, MD;  Location: [facility];  Service: Cardiovascular;  Laterality: N/A;    Family History  Problem Relation Age of Onset  . Heart disease Mother        died at 17 of heart attack  . Hypertension Mother   . Heart attack Mother   . Heart  disease Father        had heart attack at age 36  . Heart attack Father   . Cancer Paternal Aunt     Allergies  Allergen Reactions  . Amoxicillin     Had fever with flu like symptoms. Has patient had a PCN reaction causing immediate rash, facial/tongue/throat swelling, SOB or lightheadedness with hypotension: no Has patient had a PCN reaction causing severe rash involving mucus membranes or skin necrosis: no Has patient had a PCN reaction that required hospitalization no Has patient had a PCN reaction occurring within the last 10 years: yes If all of the above answers are "NO", then may proceed with Cephalosporin use.  [name] Clopidogrel &  Aspirin Itching  . Aspirin Itching  . Atorvastatin     Myalgia  . Crestor [Rosuvastatin Calcium]     MYALGIA     Current Outpatient Medications on File Prior to Visit  Medication Sig Dispense Refill  . albuterol (PROVENTIL HFA;VENTOLIN HFA) 108 (90 Base) MCG/ACT inhaler Inhale 2 puffs into the lungs every 6 (six) hours as needed for wheezing or shortness of breath. 1 Inhaler 0  . alendronate (FOSAMAX) 70 MG tablet Take 1 tablet (70 mg total) by mouth once a week. Take with a full glass of water on an empty stomach. 12 tablet 3  . colchicine 0.6 MG tablet Take 1 tablet (0.6 mg total) by mouth 2 (two) times daily. 60 tablet 3  . COLCHICINE PO Take 1 tablet by mouth daily. Take 1mg  daily    . doxycycline (VIBRA-TABS) 100 MG tablet Take 1 tablet (100 mg total) by mouth 2 (two) times daily. Can give caps or generic 20 tablet 0  . Evolocumab (REPATHA SURECLICK) 253 MG/ML SOAJ Inject 140 mg into the skin every 14 (fourteen) days. 2 pen 11  . fluticasone (FLONASE) 50 MCG/ACT nasal spray Place 2 sprays into both nostrils daily. 16 g 1  . fluticasone (FLONASE) 50 MCG/ACT nasal spray Place 2 sprays into both nostrils daily. 16 g 1  . hydrOXYzine (ATARAX/VISTARIL) 10 MG tablet TAKE 1 TO 2 TABLETS BY MOUTH AT BEDTIME AS NEEDED INSOMNIA 30 tablet 0  . Melatonin 3 MG CAPS Take 1 capsule by mouth at bedtime.     . metoprolol succinate (TOPROL XL) 25 MG 24 hr tablet Take 1 tablet (25 mg total) by mouth daily. 90 tablet 3  . Misc Natural Products (COMPLETE MENOPAUSE HEALTH PO) Take 1 Dose by mouth daily.     . Multiple Vitamin (MULTIVITAMIN) tablet Take 1 tablet by mouth daily.    Vladimir Faster Glycol-Propyl Glycol (SYSTANE OP) Place 1 drop into both eyes daily as needed. Dry eyes    . PREMARIN vaginal cream Place 0.5 g vaginally 2 (two) times a week.  4  . ticagrelor (BRILINTA) 90 MG TABS tablet Take 1 tablet (90 mg total) by mouth 2 (two) times daily. 60 tablet 5  . TURMERIC PO Take 1 tablet by mouth daily.     . Vitamin D, Ergocalciferol, (DRISDOL) 50000 units CAPS capsule Take 1 capsule (50,000 Units total) by mouth every 7 (seven) days. (Patient taking differently: Take 50,000 Units by mouth every 7 (seven) days. Fridays) 6 capsule 0   No current facility-administered medications on file prior to visit.     BP 116/64   Pulse 61   Temp 98 F (36.7 C) (Oral)   Resp 16   Ht 5\' 4"  (1.626 m)   Wt 113 lb 12.8 oz (51.6 kg)  LMP  (LMP Unknown)   SpO2 100%   BMI 19.53 kg/m       Objective:   Physical Exam  General Appearance- Not in acute distress.    Chest and Lung Exam Auscultation: Breath sounds:-Normal. Clear even and unlabored. Adventitious sounds:- No Adventitious sounds.  Cardiovascular Auscultation:Rythm - Regular, rate and rythm. Heart Sounds -Normal heart sounds.  Abdomen Inspection:-Inspection Normal.  Palpation/Perucssion: Palpation and Percussion of the abdomen reveal- Non Tender, No Rebound tenderness, No rigidity(Guarding) and No Palpable abdominal masses.  Liver:-Normal.  Spleen:- Normal.   Back Mid lumbar spine tenderness to palpation.  Left SI tenderness to palpation directly. Pain on straight leg lift. Pain on lateral movements and flexion/extension of the spine.  Lower ext neurologic  L5-S1 sensation intact bilaterally. Normal patellar reflexes bilaterally. No foot drop bilaterally.      Assessment & Plan:  For your back pain with radiating features providing I think you have sciatica.  The pain is severe enough that I am considering ordering an MRI.  However I think it might be a little too early since pain is only present for 3 weeks.  You had lumbar x-ray done at urgent care 3 weeks ago and I would ask you to sign release of information form and call us back on Monday with name and number of the clinic so we can get the radiology report.  Presently I am prescribing prednisone taper course since she did not respond well to diclofenac.  Please  stop diclofenac and stop Robaxin.(Stop Robaxin since excess sedation.)  I discussed the computer caution regarding tramadol and your prior reported possible allergy to Plavix and aspirin.  Our pharmacist thinks this is probably not an accurate caution.  I went ahead and wrote the prescription.  If you have any adverse signs or symptoms/side effects with tramadol stop it and let us know.  We will see how you respond to the above and might consider PT or ordering MRI.  If your back pain subsides significantly then can try to start back exercises.  If any back pain that is severe with extreme leg weakness, foot drop or loss of bladder or bowel function then be seen at ED.  Follow-up in 7 days or as needed.  Mackie Pai, PA-C

## 2017-10-02 NOTE — Telephone Encounter (Signed)
Pt scheduled for today.  

## 2017-10-12 ENCOUNTER — Telehealth: Payer: Self-pay | Admitting: Medical

## 2017-10-12 ENCOUNTER — Telehealth: Payer: Self-pay | Admitting: Pharmacist Clinician (PhC)/ Clinical Pharmacy Specialist

## 2017-10-12 MED ORDER — ROSUVASTATIN CALCIUM 5 MG PO TABS: ORAL_TABLET | ORAL | 3 refills | Status: DC

## 2017-10-12 NOTE — Telephone Encounter (Signed)
Copied from [place] [id number]. Topic: Quick Communication - See Telephone Encounter >> Oct 12, 2017  3:53 PM Percell Belt A wrote: CRM for notification. See Telephone encounter for: pt called in and said that [name] called in meds for her leg pain and it is not helping.  Pt went to urgent care over the weekend and they gave her a muscle relaxer .  She would like to know if she can get refill on the meds that the urgent care gave her ?    [facility] [address]   Diclofenac Methocarbamol   10/12/17.

## 2017-10-12 NOTE — Telephone Encounter (Signed)
Patient reports still having itching despite switching from Repatha to [place].  Would like to rechallenge with low dose Crestor three times weekly.     Will send rx to [place].

## 2017-10-15 MED ORDER — METHOCARBAMOL 500 MG PO TABS: ORAL_TABLET | ORAL | 0 refills | Status: DC

## 2017-10-15 MED ORDER — DICLOFENAC SODIUM 75 MG PO TBEC: 75.0000 mg | DELAYED_RELEASE_TABLET | Freq: Two times a day (BID) | ORAL | 0 refills | Status: DC | PRN

## 2017-10-15 NOTE — Telephone Encounter (Signed)
Left pt a message to call back. 

## 2017-10-15 NOTE — Telephone Encounter (Signed)
I can fill both diclofenac and Robaxin.  But verify with patient that diclofenac has not caused her to have any type of reaction since epic gives history of itching with aspirin.  Also she stated that the Robaxin caused her excessive sedation when I saw her.  Is that not the case?  Sometimes when people use medications more frequently side effects diminish.  If she thinks now sedation is not excessive will fill up the Robaxin.  Also let patient know at this point if she still has pain would consider going ahead and trying to get the MRI.  She okay with me ordering that?

## 2017-10-15 NOTE — Telephone Encounter (Signed)
Pt states diclofenac doesn't make her itch and robaxin is not as sedative anymore.

## 2017-11-05 DIAGNOSIS — M2550 Pain in unspecified joint: Secondary | ICD-10-CM | POA: Diagnosis not present

## 2017-11-05 DIAGNOSIS — M25552 Pain in left hip: Secondary | ICD-10-CM | POA: Diagnosis not present

## 2017-11-05 DIAGNOSIS — M545 Low back pain: Secondary | ICD-10-CM | POA: Diagnosis not present

## 2017-11-12 DIAGNOSIS — M5442 Lumbago with sciatica, left side: Secondary | ICD-10-CM | POA: Diagnosis not present

## 2017-11-12 DIAGNOSIS — M545 Low back pain: Secondary | ICD-10-CM | POA: Diagnosis not present

## 2017-11-12 DIAGNOSIS — M25551 Pain in right hip: Secondary | ICD-10-CM | POA: Diagnosis not present

## 2017-11-12 DIAGNOSIS — M5441 Lumbago with sciatica, right side: Secondary | ICD-10-CM | POA: Diagnosis not present

## 2017-11-26 ENCOUNTER — Other Ambulatory Visit: Payer: Self-pay | Admitting: Medical

## 2017-11-26 DIAGNOSIS — Z1151 Encounter for screening for human papillomavirus (HPV): Secondary | ICD-10-CM | POA: Diagnosis not present

## 2017-11-26 DIAGNOSIS — R399 Unspecified symptoms and signs involving the genitourinary system: Secondary | ICD-10-CM | POA: Diagnosis not present

## 2017-11-26 DIAGNOSIS — N952 Postmenopausal atrophic vaginitis: Secondary | ICD-10-CM | POA: Diagnosis not present

## 2017-11-26 DIAGNOSIS — Z01419 Encounter for gynecological examination (general) (routine) without abnormal findings: Secondary | ICD-10-CM | POA: Diagnosis not present

## 2017-11-26 DIAGNOSIS — N8110 Cystocele, unspecified: Secondary | ICD-10-CM | POA: Diagnosis not present

## 2017-11-26 DIAGNOSIS — Z124 Encounter for screening for malignant neoplasm of cervix: Secondary | ICD-10-CM | POA: Diagnosis not present

## 2017-11-27 DIAGNOSIS — R8290 Unspecified abnormal findings in urine: Secondary | ICD-10-CM | POA: Diagnosis not present

## 2017-12-04 DIAGNOSIS — H04123 Dry eye syndrome of bilateral lacrimal glands: Secondary | ICD-10-CM | POA: Diagnosis not present

## 2017-12-04 DIAGNOSIS — H43393 Other vitreous opacities, bilateral: Secondary | ICD-10-CM | POA: Diagnosis not present

## 2017-12-04 DIAGNOSIS — H35371 Puckering of macula, right eye: Secondary | ICD-10-CM | POA: Diagnosis not present

## 2017-12-04 DIAGNOSIS — H5211 Myopia, right eye: Secondary | ICD-10-CM | POA: Diagnosis not present

## 2017-12-04 DIAGNOSIS — H1045 Other chronic allergic conjunctivitis: Secondary | ICD-10-CM | POA: Diagnosis not present

## 2017-12-18 ENCOUNTER — Ambulatory Visit: Payer: BLUE CROSS/BLUE SHIELD | Admitting: Cardiovascular Disease

## 2017-12-18 ENCOUNTER — Encounter: Payer: Self-pay | Admitting: Cardiovascular Disease

## 2017-12-18 VITALS — BP 126/74 | HR 70 | Ht 62.0 in | Wt 110.0 lb

## 2017-12-18 DIAGNOSIS — E785 Hyperlipidemia, unspecified: Secondary | ICD-10-CM | POA: Diagnosis not present

## 2017-12-18 DIAGNOSIS — N8110 Cystocele, unspecified: Secondary | ICD-10-CM | POA: Diagnosis not present

## 2017-12-18 DIAGNOSIS — Z87440 Personal history of urinary (tract) infections: Secondary | ICD-10-CM | POA: Diagnosis not present

## 2017-12-18 DIAGNOSIS — Z5181 Encounter for therapeutic drug level monitoring: Secondary | ICD-10-CM | POA: Diagnosis not present

## 2017-12-18 DIAGNOSIS — N816 Rectocele: Secondary | ICD-10-CM | POA: Diagnosis not present

## 2017-12-18 DIAGNOSIS — I119 Hypertensive heart disease without heart failure: Secondary | ICD-10-CM | POA: Diagnosis not present

## 2017-12-18 DIAGNOSIS — I2510 Atherosclerotic heart disease of native coronary artery without angina pectoris: Secondary | ICD-10-CM | POA: Diagnosis not present

## 2017-12-18 DIAGNOSIS — E7800 Pure hypercholesterolemia, unspecified: Secondary | ICD-10-CM | POA: Diagnosis not present

## 2017-12-18 DIAGNOSIS — N390 Urinary tract infection, site not specified: Secondary | ICD-10-CM | POA: Diagnosis not present

## 2017-12-18 MED ORDER — PANTOPRAZOLE SODIUM 40 MG PO TBEC: 40.0000 mg | DELAYED_RELEASE_TABLET | Freq: Every day | ORAL | 0 refills | Status: DC

## 2017-12-18 NOTE — Progress Notes (Signed)
Cardiology Office Note   Date:  12/18/2017   ID:  Katrina Walters, DOB Aug 07, 1956, MRN [id number]  PCP:  Mackie Pai, PA-C  Cardiologist:   Skeet Latch, MD   Chief Complaint  Patient presents with  . Follow-up  . Edema    Whenever she does a lot of walking.     History of Present Illness: Katrina Walters is a 62 y.o. female with obstructive CAD (medically managed), shortness of breath, and hyperlipidemia and who presents for follow up.  Katrina Walters was seen in clinic 06/13/16 with exertional shortness of breath.  She had a known coronary calcium score of 376 in 2015 (98% for age and gender).  She was referred for stress testing 11/2014 but was unable to complete it due to cost.  She was able to perform an ETT 06/27/16 that was notable for 56mm ST depressions inferiorly.  She was referred for [facility] 07/17/16 that revealed LVEF 65% with 100% distal RCA, 50% mid RCA, 40% ostial LM, 50% mi d LAD, 70% D1 lesions.  Her symptoms were felt to be attributable to her chronically occluded RCA which had left to right collaterals.  Medical management was recommended with consideration for CABG if her symptoms persist.  She was started on rosuvastatin but did not tolerate due to myalgias. This was switched to atorvastatin.  However this was stopped due to a rash and swelling. She was then referred to our pharmacist and started on Repatha.  Since her last appointment Katrina Walters has been struggling with back pain.  Two months ago she was working a lot to help her son sell his house.  She developed severe back pain.  She has been taking diclofenac twice daily for the last 1.5 months.  She noted upset stomach to she stopped it.  She still has pain in her L upper thigh.  She has been unable to exercise or walk due to this pain.  It also causes her heart to race.  Since her last appointment she had to stop taking PCSK inhibitors due to itching.9 she has started back taking rosuvastatin.  She notes some mild ankle discomfort  but is otherwise okay.  She continues to try and exercise regularly.  She walks on the treadmill and alternates it with 1 minute of  running.  Sometimes when she runs fast she gets some chest tightness and pain in her left arm.  It improves when she walks.  She has not noted any lower extremity edema, orthopnea, or PND.     Past Medical History:  Diagnosis Date  . Eating disorder   . Genital warts   . Hyperlipidemia   . 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 BREAST REDUCTION SURGERY  2006  . CARDIAC CATHETERIZATION N/A 07/17/2016   Procedure: Left Heart Cath and Coronary Angiography;  Surgeon: Burnell Blanks, MD;  Location: [facility];  Service: Cardiovascular;  Laterality: N/A;     Current Outpatient Medications  Medication Sig Dispense Refill  . albuterol (PROVENTIL HFA;VENTOLIN HFA) 108 (90 Base) MCG/ACT inhaler Inhale 2 puffs into the lungs every 6 (six) hours as needed for wheezing or shortness of breath. 1 Inhaler 0  . alendronate (FOSAMAX) 70 MG tablet Take 1 tablet (70 mg total) by mouth once a week. Take with a full glass of water on an empty stomach. 12 tablet 3  . aspirin EC 81 MG tablet Take 81 mg by mouth daily.    [name]  colchicine 0.6 MG tablet Take 1 tablet (0.6 mg total) by mouth 2 (two) times daily. 60 tablet 3  . doxycycline (VIBRA-TABS) 100 MG tablet Take 1 tablet (100 mg total) by mouth 2 (two) times daily. Can give caps or generic 20 tablet 0  . fluticasone (FLONASE) 50 MCG/ACT nasal spray Place 2 sprays into both nostrils daily. 16 g 1  . hydrOXYzine (ATARAX/VISTARIL) 10 MG tablet TAKE 1 TO 2 TABLETS BY MOUTH AT BEDTIME AS NEEDED INSOMNIA 30 tablet 0  . Melatonin 3 MG CAPS Take 1 capsule by mouth at bedtime.     . methocarbamol (ROBAXIN) 500 MG tablet TAKE 2 TABLETS BY MOUTH TWICE A DAY AS NEEDED 60 tablet 0  . metoprolol succinate (TOPROL XL) 25 MG 24 hr tablet Take 1 tablet (25 mg total) by mouth daily. 90  tablet 3  . Misc Natural Products (COMPLETE MENOPAUSE HEALTH PO) Take 1 Dose by mouth daily.     . Multiple Vitamin (MULTIVITAMIN) tablet Take 1 tablet by mouth daily.    Vladimir Faster Glycol-Propyl Glycol (SYSTANE OP) Place 1 drop into both eyes daily as needed. Dry eyes    . predniSONE (DELTASONE) 10 MG tablet 6 TAB PO DAY 1 5 TAB PO DAY 2 4 TAB PO DAY 3 3 TAB PO DAY 4 2 TAB PO DAY 5 1 TAB PO DAY 6 21 tablet 0  . PREMARIN vaginal cream Place 0.5 g vaginally 2 (two) times a week.  4  . rosuvastatin (CRESTOR) 5 MG tablet Take 1 tablet by mouth up to three times weekly as tolerated 36 tablet 3  . ticagrelor (BRILINTA) 90 MG TABS tablet Take 1 tablet (90 mg total) by mouth 2 (two) times daily. 60 tablet 5  . traMADol (ULTRAM) 50 MG tablet Take 1 tablet (50 mg total) by mouth every 8 (eight) hours as needed. 16 tablet 0  . TURMERIC PO Take 1 tablet by mouth daily.    . Vitamin D, Ergocalciferol, (DRISDOL) 50000 units CAPS capsule Take 1 capsule (50,000 Units total) by mouth every 7 (seven) days. (Patient taking differently: Take 50,000 Units by mouth every 7 (seven) days. Fridays) 6 capsule 0  . pantoprazole (PROTONIX) 40 MG tablet Take 1 tablet (40 mg total) by mouth daily. 30 tablet 0   No current facility-administered medications for this visit.     Allergies:   Amoxicillin; Clopidogrel & aspirin; Aspirin; Atorvastatin; and Crestor [rosuvastatin calcium]    Social History:  The patient  reports that she has never smoked. She has never used smokeless tobacco. She reports that she does not drink alcohol or use drugs.   Family History:  The patient's family history includes Cancer in her paternal aunt; Heart attack in her father and mother; Heart disease in her father and mother; Hypertension in her mother.    ROS:  Please see the history of present illness.   Otherwise, review of systems are positive for none.   All other systems are reviewed and negative.    PHYSICAL EXAM: VS:  BP  126/74 (BP Location: Left Arm, Patient Position: Sitting, Cuff Size: Normal)   Pulse 70   Ht 5\' 2"  (1.575 m)   Wt 110 lb (49.9 kg)   LMP  (LMP Unknown)   BMI 20.12 kg/m  , BMI Body mass index is 20.12 kg/m. GENERAL:  Well appearing HEENT: Pupils equal round and reactive, fundi not visualized, oral mucosa unremarkable NECK:  No jugular venous distention, waveform within normal limits, carotid upstroke  brisk and symmetric, no bruits LUNGS:  Clear to auscultation bilaterally HEART:  RRR.  PMI not displaced or sustained,S1 and S2 within normal limits, no S3, no S4, no clicks, no rubs, no murmurs ABD:  Flat, positive bowel sounds normal in frequency in pitch, no bruits, no rebound, no guarding, no midline pulsatile mass, no hepatomegaly, no splenomegaly EXT:  2 plus pulses throughout, no edema, no cyanosis no clubbing SKIN:  No rashes no nodules NEURO:  Cranial nerves II through XII grossly intact, motor grossly intact throughout PSYCH:  Cognitively intact, oriented to person place and time   EKG:  EKG is ordered today. The ekg ordered 05/15/16 demonstrates sinus bradycardia. Rate 59 bpm. 05/20/17: Sinus rhythm.  Rate 70 bpm.   12/18/17: Sinus rhythm.  Rate 70 bpm.  ETT 06/27/16:  Blood pressure demonstrated a normal response to exercise.  No T wave inversion was noted during stress.  Horizontal ST segment depression ST segment depression of 2 mm was noted during stress in the II, III and aVF leads, and returning to baseline after less than 1 minute of recovery.  LHC 07/17/16:  The left ventricular systolic function is normal.  LV end diastolic pressure is normal.  The left ventricular ejection fraction is greater than 65% by visual estimate.  There is no mitral valve regurgitation.  Prox RCA lesion, 20 %stenosed.  Mid RCA lesion, 50 %stenosed.  Dist RCA lesion, 100 %stenosed.  Ost LM lesion, 40 %stenosed.  Ost LM to LM lesion, 40 %stenosed.  Mid LAD lesion, 50  %stenosed.  1st Diag lesion, 70 %stenosed.  Ost 1st Mrg to 1st Mrg lesion, 40 %stenosed.  Ost LAD to Prox LAD lesion, 20 %stenosed.  Prox RCA to Mid RCA lesion, 20 %stenosed.   1. Triple vessel CAD  2. Mild to moderate stenosis left main artery. This appears to be eccentric and does not appear to be flow limiting.  3. Moderate stenosis mid LAD. The small caliber diagonal branch has moderately severe stenosis.  4. Mild to moderate stenosis in the obtuse marginal branch of the Circumflex 5. The RCA is a large dominant vessel with moderate distal stenosis prior to total occlusion of the vessel at the distal bifurcation. The posterolateral artery and the posterior descending artery fills from left to right collaterals.  6. Normal LV systolic function  Recommendations: Her abnormal stress test is likely due to the chronic occlusion of the distal RCA. She has mild to moderate stenosis in the left main artery as well as moderate stenosis in the LAD. I think the best plan for now is medical management of her CAD. If she fails medical management, will have to consider CABG.    Recent Labs: 09/24/2017: ALT 9; BUN 13; Creatinine, Ser 0.71; Hemoglobin 14.1; Platelets 307.0; Potassium 4.9; Sodium 139   Lipid Panel    Component Value Date/Time   CHOL 111 09/24/2017 1200   TRIG 136.0 09/24/2017 1200   HDL 27.30 (L) 09/24/2017 1200   CHOLHDL 4 09/24/2017 1200   VLDL 27.2 09/24/2017 1200   LDLCALC 57 09/24/2017 1200      Wt Readings from Last 3 Encounters:  12/18/17 110 lb (49.9 kg)  10/02/17 113 lb 12.8 oz (51.6 kg)  09/24/17 112 lb (50.8 kg)      ASSESSMENT AND PLAN:  # 3 vessel CAD: # Shortness of breath: # Hyperlipidemia: Katrina Walters is doing well on medical management of her three-vessel CAD. She exercises regularly and has had some chest discomfort that is  stable.  She should not be on NSAIDs.  Repatha was discontinued due to itching.  She is tolerating low-dose rosuvastatin.    Check lipids and CMP today.  Continue clopidogrel and metoprolol.  She has an aspirin allergy.  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  # Palpitations: Stable on metoprolol.  # Abdominal pain: Katrina Walters has struggled with abdominal discomfort after being on diclofenac for over a month.  She should not be on NSAIDs.  We will give her 1 month treatment of pantoprazole 40 mg daily.  Current medicines are reviewed at length with the patient today.  The patient does not have concerns regarding medicines.  The following changes have been made:  Start pantoprazole.   Labs/ tests ordered today include:   Orders Placed This Encounter  Procedures  . Lipid panel  . Comprehensive metabolic panel  . EKG 12-Lead     Disposition:   FU with Josaiah Muhammed C. Oval Linsey, MD, [facility] in 6 months.     This note was written with the assistance of speech recognition software.  Please excuse any transcriptional errors.  Signed, Somaly Marteney C. Oval Linsey, MD, [facility]  12/18/2017 6:00 PM    [place]

## 2017-12-18 NOTE — Patient Instructions (Addendum)
Medication Instructions:  Start: Protonix 40 mg daily  Stop: Voltaren 75 mg  Okay to take Tylenol for Arthritis  Labwork: Your physician recommends that you return for lab work in: 1 week (CMP/Lipid)    Follow-Up: Your physician wants you to follow-up in: 6 month with Dr. Oval Linsey. You will receive a reminder letter in the mail two months in advance. If you don't receive a letter, please call our office to schedule the follow-up appointment.   Any Other Special Instructions Will Be Listed Below (If Applicable).     If you need a refill on your cardiac medications before your next appointment, please call your pharmacy.

## 2017-12-21 DIAGNOSIS — M7062 Trochanteric bursitis, left hip: Secondary | ICD-10-CM | POA: Diagnosis not present

## 2017-12-22 DIAGNOSIS — E7800 Pure hypercholesterolemia, unspecified: Secondary | ICD-10-CM | POA: Diagnosis not present

## 2017-12-22 DIAGNOSIS — E785 Hyperlipidemia, unspecified: Secondary | ICD-10-CM | POA: Diagnosis not present

## 2017-12-22 DIAGNOSIS — I119 Hypertensive heart disease without heart failure: Secondary | ICD-10-CM | POA: Diagnosis not present

## 2017-12-22 DIAGNOSIS — Z5181 Encounter for therapeutic drug level monitoring: Secondary | ICD-10-CM | POA: Diagnosis not present

## 2017-12-22 LAB — LIPID PANEL
Chol/HDL Ratio: 5.3 ratio — ABNORMAL HIGH (ref 0.0–4.4)
Cholesterol, Total: 164 mg/dL (ref 100–199)
HDL: 31 mg/dL — ABNORMAL LOW (ref >39)
LDL Calculated: 99 mg/dL (ref 0–99)
Triglycerides: 171 mg/dL — ABNORMAL HIGH (ref 0–149)
VLDL Cholesterol Cal: 34 mg/dL (ref 5–40)

## 2017-12-22 LAB — COMPREHENSIVE METABOLIC PANEL
ALT: 16 IU/L (ref 0–32)
AST: 18 IU/L (ref 0–40)
Albumin/Globulin Ratio: 1.7 (ref 1.2–2.2)
Albumin: 4.7 g/dL (ref 3.6–4.8)
Alkaline Phosphatase: 76 IU/L (ref 39–117)
BUN/Creatinine Ratio: 23 (ref 12–28)
BUN: 19 mg/dL (ref 8–27)
Bilirubin Total: 0.4 mg/dL (ref 0.0–1.2)
CO2: 26 mmol/L (ref 20–29)
Calcium: 9.7 mg/dL (ref 8.7–10.3)
Chloride: 99 mmol/L (ref 96–106)
Creatinine, Ser: 0.81 mg/dL (ref 0.57–1.00)
GFR calc Af Amer: 91 mL/min/{1.73_m2} (ref >59)
GFR calc non Af Amer: 79 mL/min/{1.73_m2} (ref >59)
Globulin, Total: 2.7 g/dL (ref 1.5–4.5)
Glucose: 89 mg/dL (ref 65–99)
Potassium: 5.1 mmol/L (ref 3.5–5.2)
Sodium: 139 mmol/L (ref 134–144)
Total Protein: 7.4 g/dL (ref 6.0–8.5)

## 2017-12-28 ENCOUNTER — Other Ambulatory Visit: Payer: Self-pay

## 2017-12-28 DIAGNOSIS — E785 Hyperlipidemia, unspecified: Secondary | ICD-10-CM

## 2017-12-28 DIAGNOSIS — I25119 Atherosclerotic heart disease of native coronary artery with unspecified angina pectoris: Secondary | ICD-10-CM

## 2017-12-28 MED ORDER — EZETIMIBE 10 MG PO TABS: 10.0000 mg | ORAL_TABLET | Freq: Every day | ORAL | 6 refills | Status: DC

## 2017-12-28 NOTE — Progress Notes (Signed)
zetia 

## 2018-01-15 ENCOUNTER — Other Ambulatory Visit: Payer: Self-pay | Admitting: Cardiovascular Disease

## 2018-01-15 NOTE — Telephone Encounter (Signed)
Rx(s) sent to pharmacy electronically.  

## 2018-01-25 ENCOUNTER — Telehealth: Payer: Self-pay | Admitting: Medical

## 2018-01-25 MED ORDER — COLCHICINE 0.6 MG PO TABS: 0.6000 mg | ORAL_TABLET | Freq: Two times a day (BID) | ORAL | 0 refills | Status: DC

## 2018-01-25 NOTE — Telephone Encounter (Signed)
Copied from [place] [id number]. Topic: Quick Communication - Rx Refill/Question >> Jan 25, 2018  9:47 AM [name] wrote: Medication: colchicine 0.6 MG tablet requesting refills as well.   Has the patient contacted their pharmacy? Yes.   (Agent: If no, request that the patient contact the pharmacy for the refill.) (Agent: If yes, when and what did the pharmacy advise?)  Preferred Pharmacy (with phone number or street name): [id number] - [place], [place]: Please be advised that RX refills may take up to 3 business days. We ask that you follow-up with your pharmacy.

## 2018-01-25 NOTE — Telephone Encounter (Addendum)
Attempted to contact pt regarding prescription refill request for colchicine; chart shows prescription written 12/18/16; however last office visit 09/24/17; refilled per protocol.

## 2018-02-15 ENCOUNTER — Other Ambulatory Visit: Payer: Self-pay | Admitting: *Deleted

## 2018-02-15 MED ORDER — EZETIMIBE 10 MG PO TABS: 10.0000 mg | ORAL_TABLET | Freq: Every day | ORAL | 1 refills | Status: DC

## 2018-02-23 ENCOUNTER — Other Ambulatory Visit: Payer: Self-pay | Admitting: Medical

## 2018-03-24 ENCOUNTER — Other Ambulatory Visit: Payer: Self-pay | Admitting: Medical

## 2018-03-29 ENCOUNTER — Telehealth: Payer: Self-pay | Admitting: Cardiovascular Disease

## 2018-03-29 DIAGNOSIS — E785 Hyperlipidemia, unspecified: Secondary | ICD-10-CM | POA: Diagnosis not present

## 2018-03-29 DIAGNOSIS — I25119 Atherosclerotic heart disease of native coronary artery with unspecified angina pectoris: Secondary | ICD-10-CM | POA: Diagnosis not present

## 2018-03-29 LAB — HEPATIC FUNCTION PANEL
ALT: 15 IU/L (ref 0–32)
AST: 17 IU/L (ref 0–40)
Albumin: 4.1 g/dL (ref 3.6–4.8)
Alkaline Phosphatase: 70 IU/L (ref 39–117)
Bilirubin Total: 0.4 mg/dL (ref 0.0–1.2)
Bilirubin, Direct: 0.1 mg/dL (ref 0.00–0.40)
Total Protein: 7 g/dL (ref 6.0–8.5)

## 2018-03-29 LAB — LIPID PANEL W/O CHOL/HDL RATIO
Cholesterol, Total: 152 mg/dL (ref 100–199)
HDL: 31 mg/dL — ABNORMAL LOW (ref >39)
LDL Calculated: 95 mg/dL (ref 0–99)
Triglycerides: 129 mg/dL (ref 0–149)
VLDL Cholesterol Cal: 26 mg/dL (ref 5–40)

## 2018-03-29 NOTE — Telephone Encounter (Signed)
Pt called as she has been taking Zetia only since end of May and over the past 2 weeks she has not been able to tolerate it any more. She stopped taking the medication a few days ago as she cannot take it the pain. She is only wearing slides, as her feet are so swollen and red. Her ankles are tenders. Not other areas of concern other than her feet. She knows that after a few more days it will start to clear just wants to know where to go from here. She states she will go back on Repatha and try to figure out the itching as the current pain is really slowing her down from moving around. Told her I would send message to Erasmo Downer, PharmD as she has been working with her. And setup appt for her in Lipid clinic on 8/22 @ 10:30am. Pt verbalized understanding no additional questions at this time.

## 2018-03-29 NOTE — Telephone Encounter (Signed)
Left message to call back  

## 2018-03-29 NOTE — Telephone Encounter (Signed)
Follow up  ° ° °Patient is returning call.  °

## 2018-03-29 NOTE — Telephone Encounter (Signed)
New Message   Pt c/o medication issue:  1. Name of Medication: ezetimibe (ZETIA) 10 MG tablet  2. How are you currently taking this medication (dosage and times per day)? Take 1 tablet (10 mg total) by mouth daily  3. Are you having a reaction (difficulty breathing--STAT)? yes  4. What is your medication issue? Pt states it makes her have chronic pain and unable to walk. Please call

## 2018-04-29 ENCOUNTER — Encounter: Payer: Self-pay | Admitting: Pharmacist Clinician (PhC)/ Clinical Pharmacy Specialist

## 2018-04-29 ENCOUNTER — Ambulatory Visit (INDEPENDENT_AMBULATORY_CARE_PROVIDER_SITE_OTHER): Payer: BLUE CROSS/BLUE SHIELD | Admitting: Pharmacist Clinician (PhC)/ Clinical Pharmacy Specialist

## 2018-04-29 DIAGNOSIS — E785 Hyperlipidemia, unspecified: Secondary | ICD-10-CM | POA: Diagnosis not present

## 2018-04-29 NOTE — Progress Notes (Signed)
04/30/2018 Katrina Walters 07/18/1956 [id number]   HPI:  Katrina Walters is a 62 y.o. female patient of Dr Oval Linsey, who presents today for a lipid clinic evaluation.  Her cardiac history is significant for 3 vessel CAD including a 100% stenosed distal RCA with left to right collaterals.  Her calcium score in 2015 was 376, (98% for age and gender).   In addition to this, her medical history is significant for Mediterranean fever, for which she takes daily colchicine.    She was originally on Lofall in 2018, and took for 7-8 months without problem.  She then noticed some itching on her skin after injections.   She was switched to Praluent, with no relief.  She stopped the medication and went back on rosuvastatin, despite the pain it caused.   She stopped that again several months ago and would like to re-challenge with Repatha  Current Medications:  None  Cholesterol Goals:   LDL < 70  Intolerant/previously tried:  Rosuvastatin and atorvastatin both caused leg/foot pains.  Took Repatha for 7-8 months without issue then noted some itching.  Tried Praluent with same results.  No swelling of face/tongue/mouth.    Family history:   Mother died from MI at age 33, father had MI at 10, then CABG, now stable at 58; maternal uncle with CABG  Social History:  Never smoker or user of tobacco; no alcohol Diet:   Eats about half meals at home; otherwise in sit down restaurants;  Eats plenty of vegetables/fruits; enjoys home made smoothies once or twice weekly with berries, bananas, kale, spinach.  Does not eat anything fried.  Mostly chicken and fish with vegetables at dinner;  Admits to a sweet tooth, but is trying to limit herself to dark chocolate once daily.  Exercise:    Has not been able to do much in the past few months due to sciatic nerve pain and foot pain  Labs:   03/2018:  TC 152, TG 129, HDL 31, LDL 95 - no medication  12/2017:  TC 164, TG 171, HDL 31, LDL 99  Current Outpatient Medications   Medication Sig Dispense Refill  . diclofenac (VOLTAREN) 75 MG EC tablet Take 75 mg by mouth 2 (two) times daily.    . Estradiol [facility]) Place vaginally.    . methocarbamol (ROBAXIN) 500 MG tablet TAKE 2 TABLETS BY MOUTH TWICE A DAY AS NEEDED 60 tablet 0  . metoprolol succinate (TOPROL XL) 25 MG 24 hr tablet Take 1 tablet (25 mg total) by mouth daily. 90 tablet 3  . Misc Natural Products (COMPLETE MENOPAUSE HEALTH PO) Take 1 Dose by mouth daily.     Vladimir Faster Glycol-Propyl Glycol (SYSTANE OP) Place 1 drop into both eyes daily as needed. Dry eyes    . ticagrelor (BRILINTA) 90 MG TABS tablet Take 1 tablet (90 mg total) by mouth 2 (two) times daily. 60 tablet 5  . aspirin EC 81 MG tablet Take 81 mg by mouth daily.    . colchicine 0.6 MG tablet TAKE 1 TABLET BY MOUTH TWICE A DAY 60 tablet 0  . Melatonin 3 MG CAPS Take 1 capsule by mouth at bedtime.     [name] PREMARIN vaginal cream Place 0.5 g vaginally 2 (two) times a week.  4   No current facility-administered medications for this visit.     Allergies  Allergen Reactions  . Amoxicillin     Had fever with flu like symptoms. Has patient had a PCN reaction causing immediate  rash, facial/tongue/throat swelling, SOB or lightheadedness with hypotension: no Has patient had a PCN reaction causing severe rash involving mucus membranes or skin necrosis: no Has patient had a PCN reaction that required hospitalization no Has patient had a PCN reaction occurring within the last 10 years: yes If all of the above answers are "NO", then may proceed with Cephalosporin use.  [name] Clopidogrel & Aspirin Itching  . Aspirin Itching  . Atorvastatin     Myalgia  . Crestor [Rosuvastatin Calcium]     MYALGIA     Past Medical History:  Diagnosis Date  . Eating disorder   . Genital warts   . Hyperlipidemia   . 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   Hyperlipidemia Patient with CAD, who has had problems with both  statins and PCSK-9 inhibitors in the past.  She would like to try Repatha again as she is not sure if the itching was medication related.   Will give her samples for 1 month to be sure she can tolerate, before submitting information to her insurance.  She is to call in 3 weeks, after giving the second injection time, and let us know how she is doing.  We will proceed from there.  She does not qualify for the ORION 4 trial, but can look into other possibilities should that be necessary.     Tommy Medal PharmD CPP [facility] [address] [place], [place] [address]

## 2018-04-29 NOTE — Patient Instructions (Signed)
To get copay card:  Go to [email or web address]   Click on tab "Paying for Repatha"   Scroll down the page to the box "Lowering your out of pocket.."   Click on the blue "sign up for a copay card"    You can also sign up to receive a sharps disposal container   Start the Repatha samples this week.  After the second dose, call to let us know if you will be able to tolerate or if the itching is too much.  If you tolerate, we will submit the information to your insurance company  Kristin/Raquel at [phone]

## 2018-04-30 NOTE — Assessment & Plan Note (Signed)
Patient with CAD, who has had problems with both statins and PCSK-9 inhibitors in the past.  She would like to try Repatha again as she is not sure if the itching was medication related.   Will give her samples for 1 month to be sure she can tolerate, before submitting information to her insurance.  She is to call in 3 weeks, after giving the second injection time, and let us know how she is doing.  We will proceed from there.  She does not qualify for the ORION 4 trial, but can look into other possibilities should that be necessary.

## 2018-05-02 ENCOUNTER — Other Ambulatory Visit: Payer: Self-pay | Admitting: Medical

## 2018-05-21 DIAGNOSIS — I2510 Atherosclerotic heart disease of native coronary artery without angina pectoris: Secondary | ICD-10-CM | POA: Diagnosis not present

## 2018-05-21 DIAGNOSIS — M5126 Other intervertebral disc displacement, lumbar region: Secondary | ICD-10-CM | POA: Diagnosis not present

## 2018-05-21 DIAGNOSIS — M79652 Pain in left thigh: Secondary | ICD-10-CM | POA: Diagnosis not present

## 2018-05-21 DIAGNOSIS — M533 Sacrococcygeal disorders, not elsewhere classified: Secondary | ICD-10-CM | POA: Diagnosis not present

## 2018-05-21 DIAGNOSIS — R937 Abnormal findings on diagnostic imaging of other parts of musculoskeletal system: Secondary | ICD-10-CM | POA: Diagnosis not present

## 2018-05-21 DIAGNOSIS — M5127 Other intervertebral disc displacement, lumbosacral region: Secondary | ICD-10-CM | POA: Diagnosis not present

## 2018-05-21 DIAGNOSIS — M47897 Other spondylosis, lumbosacral region: Secondary | ICD-10-CM | POA: Diagnosis not present

## 2018-05-21 DIAGNOSIS — M5136 Other intervertebral disc degeneration, lumbar region: Secondary | ICD-10-CM | POA: Diagnosis not present

## 2018-05-21 DIAGNOSIS — Z79899 Other long term (current) drug therapy: Secondary | ICD-10-CM | POA: Diagnosis not present

## 2018-05-21 DIAGNOSIS — M47896 Other spondylosis, lumbar region: Secondary | ICD-10-CM | POA: Diagnosis not present

## 2018-05-21 DIAGNOSIS — M25551 Pain in right hip: Secondary | ICD-10-CM | POA: Diagnosis not present

## 2018-05-21 DIAGNOSIS — M25552 Pain in left hip: Secondary | ICD-10-CM | POA: Diagnosis not present

## 2018-05-21 DIAGNOSIS — M4186 Other forms of scoliosis, lumbar region: Secondary | ICD-10-CM | POA: Diagnosis not present

## 2018-05-21 DIAGNOSIS — M48061 Spinal stenosis, lumbar region without neurogenic claudication: Secondary | ICD-10-CM | POA: Diagnosis not present

## 2018-05-21 DIAGNOSIS — M545 Low back pain: Secondary | ICD-10-CM | POA: Diagnosis not present

## 2018-05-21 DIAGNOSIS — K5900 Constipation, unspecified: Secondary | ICD-10-CM | POA: Diagnosis not present

## 2018-05-21 DIAGNOSIS — M79651 Pain in right thigh: Secondary | ICD-10-CM | POA: Diagnosis not present

## 2018-05-28 DIAGNOSIS — M5127 Other intervertebral disc displacement, lumbosacral region: Secondary | ICD-10-CM | POA: Diagnosis not present

## 2018-05-28 DIAGNOSIS — M5442 Lumbago with sciatica, left side: Secondary | ICD-10-CM | POA: Diagnosis not present

## 2018-05-28 DIAGNOSIS — G8929 Other chronic pain: Secondary | ICD-10-CM | POA: Diagnosis not present

## 2018-05-28 DIAGNOSIS — M5441 Lumbago with sciatica, right side: Secondary | ICD-10-CM | POA: Diagnosis not present

## 2018-05-31 ENCOUNTER — Other Ambulatory Visit: Payer: Self-pay | Admitting: Pharmacist Clinician (PhC)/ Clinical Pharmacy Specialist

## 2018-05-31 MED ORDER — EVOLOCUMAB 140 MG/ML ~~LOC~~ SOAJ: 140.0000 mg | SUBCUTANEOUS | 12 refills | Status: DC

## 2018-06-07 DIAGNOSIS — M5416 Radiculopathy, lumbar region: Secondary | ICD-10-CM | POA: Diagnosis not present

## 2018-06-18 ENCOUNTER — Encounter: Payer: Self-pay | Admitting: Cardiovascular Disease

## 2018-06-18 ENCOUNTER — Ambulatory Visit: Payer: BLUE CROSS/BLUE SHIELD | Admitting: Cardiovascular Disease

## 2018-06-18 VITALS — BP 116/68 | Ht 63.0 in | Wt 106.0 lb

## 2018-06-18 DIAGNOSIS — R0602 Shortness of breath: Secondary | ICD-10-CM | POA: Diagnosis not present

## 2018-06-18 DIAGNOSIS — E785 Hyperlipidemia, unspecified: Secondary | ICD-10-CM | POA: Diagnosis not present

## 2018-06-18 DIAGNOSIS — I25119 Atherosclerotic heart disease of native coronary artery with unspecified angina pectoris: Secondary | ICD-10-CM | POA: Diagnosis not present

## 2018-06-18 DIAGNOSIS — Z5181 Encounter for therapeutic drug level monitoring: Secondary | ICD-10-CM | POA: Diagnosis not present

## 2018-06-18 NOTE — Addendum Note (Signed)
Addended by: Alvina Filbert B on: 06/18/2018 01:41 PM   Modules accepted: Orders

## 2018-06-18 NOTE — Progress Notes (Signed)
Cardiology Office Note   Date:  06/18/2018   ID:  Katrina Walters, DOB 01/27/1956, MRN [id number]  PCP:  Mackie Pai, PA-C  Cardiologist:   Skeet Latch, MD   No chief complaint on file.    History of Present Illness: Katrina Walters is a 62 y.o. female with obstructive CAD (medically managed), shortness of breath, and hyperlipidemia and who presents for follow up.  Katrina Walters was seen in clinic 06/13/16 with exertional shortness of breath.  She had a known coronary calcium score of 376 in 2015 (98% for age and gender).  She was referred for stress testing 11/2014 but was unable to complete it due to cost.  She was able to perform an ETT 06/27/16 that was notable for 80mm ST depressions inferiorly.  She was referred for [facility] 07/17/16 that revealed LVEF 65% with 100% distal RCA, 50% mid RCA, 40% ostial LM, 50% mid LAD, and 70% D1 lesions.  Her symptoms were felt to be attributable to her chronically occluded RCA which had left to right collaterals.  Medical management was recommended with consideration for CABG if her symptoms persist.  She was started on rosuvastatin but did not tolerate due to myalgias. This was switched to atorvastatin.  However this was stopped due to a rash and swelling. She was then referred to our pharmacist and started on Repatha which she stopped 2/2 pruritis.  She started back on rosuvastatin but was unable to tolerate it again so she had her first shot of Repatha again 04/2018.  So far she seems to be tolerating it well.  In retrospect she thinks the pruritus was due to another medication.  3 weeks ago Katrina Walters went to the emergency department due to severe back pain.  She was found to have herniated disc.  She was treated with steroids but did not tolerate them due to palpitations.  She is now on a muscle relaxant, diclofenac, and hydrocodone.  She is unable to tolerate the hydrocodone.  She occasionally has palpitations that last for a few seconds at a time but is not  bothered by them.  During the time when her back was hurting she was unable to exercise.  She has just started back trying to walk more.  She notes that her feet swell after walking but improves with elevation of her legs.  She has no orthopnea or PND.  She plans to get a back injection soon and hopes that this will help her to be more mobile.  Her diet has been good.  She has very limited red meat and mostly eats fish and chicken.  She eats lots of fruits and vegetables and limits her fried foods and carbohydrate intake.   Past Medical History:  Diagnosis Date  . Eating disorder   . Genital warts   . Hyperlipidemia   . 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 BREAST REDUCTION SURGERY  2006  . CARDIAC CATHETERIZATION N/A 07/17/2016   Procedure: Left Heart Cath and Coronary Angiography;  Surgeon: Burnell Blanks, MD;  Location: [facility];  Service: Cardiovascular;  Laterality: N/A;     Current Outpatient Medications  Medication Sig Dispense Refill  . aspirin EC 81 MG tablet Take 81 mg by mouth daily.    . colchicine 0.6 MG tablet TAKE 1 TABLET BY MOUTH TWICE A DAY 60 tablet 0  . diclofenac (VOLTAREN) 75 MG EC tablet Take 75 mg by mouth 2 (two)  times daily.    . diclofenac sodium (VOLTAREN) 1 % GEL Apply topically 4 (four) times daily.    . Estradiol [facility]) Place vaginally.    . Evolocumab (REPATHA SURECLICK) 993 MG/ML SOAJ Inject 140 mg into the skin every 14 (fourteen) days. 2 pen 12  . Melatonin 3 MG CAPS Take 1 capsule by mouth at bedtime.     . methocarbamol (ROBAXIN) 500 MG tablet TAKE 2 TABLETS BY MOUTH TWICE A DAY AS NEEDED 60 tablet 0  . metoprolol succinate (TOPROL XL) 25 MG 24 hr tablet Take 1 tablet (25 mg total) by mouth daily. 90 tablet 3  . Misc Natural Products (COMPLETE MENOPAUSE HEALTH PO) Take 1 Dose by mouth daily.     . nitrofurantoin, macrocrystal-monohydrate, (MACROBID) 100 MG capsule Take one pill  daily for suppression    . omeprazole (PRILOSEC) 40 MG capsule TAKE 1 CAPSULE(S) BY MOUTH ONCE A DAY FOR 1 DAY  0  . Polyethyl Glycol-Propyl Glycol (SYSTANE OP) Place 1 drop into both eyes daily as needed. Dry eyes    . PREMARIN vaginal cream Place 0.5 g vaginally 2 (two) times a week.  4  . ticagrelor (BRILINTA) 90 MG TABS tablet Take 1 tablet (90 mg total) by mouth 2 (two) times daily. 60 tablet 5   No current facility-administered medications for this visit.     Allergies:   Amoxicillin; Clopidogrel & aspirin; Atorvastatin; and Crestor [rosuvastatin calcium]    Social History:  The patient  reports that she has never smoked. She has never used smokeless tobacco. She reports that she does not drink alcohol or use drugs.   Family History:  The patient's family history includes Cancer in her paternal aunt; Heart attack in her father and mother; Heart disease in her father and mother; Hypertension in her mother.    ROS:  Please see the history of present illness.   Otherwise, review of systems are positive for none.   All other systems are reviewed and negative.    PHYSICAL EXAM: VS:  BP 116/68   Ht 5\' 3"  (1.6 m)   Wt 106 lb (48.1 kg)   LMP  (LMP Unknown)   BMI 18.78 kg/m  , BMI Body mass index is 18.78 kg/m. GENERAL:  Well appearing HEENT: Pupils equal round and reactive, fundi not visualized, oral mucosa unremarkable NECK:  No jugular venous distention, waveform within normal limits, carotid upstroke brisk and symmetric, no bruits LUNGS:  Clear to auscultation bilaterally HEART:  RRR.  PMI not displaced or sustained,S1 and S2 within normal limits, no S3, no S4, no clicks, no rubs, no murmurs ABD:  Flat, positive bowel sounds normal in frequency in pitch, no bruits, no rebound, no guarding, no midline pulsatile mass, no hepatomegaly, no splenomegaly EXT:  2 plus pulses throughout, no edema, no cyanosis no clubbing.  Kyphosis SKIN:  No rashes no nodules NEURO:  Cranial nerves II  through XII grossly intact, motor grossly intact throughout PSYCH:  Cognitively intact, oriented to person place and time   EKG:  EKG is ordered today. The ekg ordered 05/15/16 demonstrates sinus bradycardia. Rate 59 bpm. 05/20/17: Sinus rhythm.  Rate 70 bpm.   12/18/17: Sinus rhythm.  Rate 70 bpm. 06/18/18: Sinus rhythm.  Rate 71 bpm.  ETT 06/27/16:  Blood pressure demonstrated a normal response to exercise.  No T wave inversion was noted during stress.  Horizontal ST segment depression ST segment depression of 2 mm was noted during stress in the II, III  and aVF leads, and returning to baseline after less than 1 minute of recovery.  LHC 07/17/16:  The left ventricular systolic function is normal.  LV end diastolic pressure is normal.  The left ventricular ejection fraction is greater than 65% by visual estimate.  There is no mitral valve regurgitation.  Prox RCA lesion, 20 %stenosed.  Mid RCA lesion, 50 %stenosed.  Dist RCA lesion, 100 %stenosed.  Ost LM lesion, 40 %stenosed.  Ost LM to LM lesion, 40 %stenosed.  Mid LAD lesion, 50 %stenosed.  1st Diag lesion, 70 %stenosed.  Ost 1st Mrg to 1st Mrg lesion, 40 %stenosed.  Ost LAD to Prox LAD lesion, 20 %stenosed.  Prox RCA to Mid RCA lesion, 20 %stenosed.   1. Triple vessel CAD  2. Mild to moderate stenosis left main artery. This appears to be eccentric and does not appear to be flow limiting.  3. Moderate stenosis mid LAD. The small caliber diagonal branch has moderately severe stenosis.  4. Mild to moderate stenosis in the obtuse marginal branch of the Circumflex 5. The RCA is a large dominant vessel with moderate distal stenosis prior to total occlusion of the vessel at the distal bifurcation. The posterolateral artery and the posterior descending artery fills from left to right collaterals.  6. Normal LV systolic function  Recommendations: Her abnormal stress test is likely due to the chronic occlusion of the  distal RCA. She has mild to moderate stenosis in the left main artery as well as moderate stenosis in the LAD. I think the best plan for now is medical management of her CAD. If she fails medical management, will have to consider CABG.    Recent Labs: 09/24/2017: Hemoglobin 14.1; Platelets 307.0 12/22/2017: BUN 19; Creatinine, Ser 0.81; Potassium 5.1; Sodium 139 03/29/2018: ALT 15   Lipid Panel    Component Value Date/Time   CHOL 152 03/29/2018 0824   TRIG 129 03/29/2018 0824   HDL 31 (L) 03/29/2018 0824   CHOLHDL 5.3 (H) 12/22/2017 0835   CHOLHDL 4 09/24/2017 1200   VLDL 27.2 09/24/2017 1200   LDLCALC 95 03/29/2018 0824      Wt Readings from Last 3 Encounters:  06/18/18 106 lb (48.1 kg)  12/18/17 110 lb (49.9 kg)  10/02/17 113 lb 12.8 oz (51.6 kg)      ASSESSMENT AND PLAN:  # 3 vessel CAD: # Shortness of breath: # Hyperlipidemia: Ms. Stonesifer is doing well on medical management of her three-vessel CAD.  She is starting back with her exercise routine and has no exertional chest pain or shortness of breath.  We recommended that she stop taking diclofenac as soon as possible.  I agree that getting an injection for her back would be a much better option.  She is now tolerating Repatha.  We will check lipids and a CMP 07/2018.  Continue metoprolol and aspirin.    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  # Palpitations: Limited symptoms on metoprolol.   Current medicines are reviewed at length with the patient today.  The patient does not have concerns regarding medicines.  The following changes have been made: None  Labs/ tests ordered today include:   Orders Placed This Encounter  Procedures  . Lipid panel  . Comprehensive metabolic panel     Disposition:   FU with Timothee Gali C. Oval Linsey, MD, [facility] in 6 months.      Signed, Damacio Weisgerber C. Oval Linsey, MD, [facility]  06/18/2018  1:10 PM    [facility]

## 2018-06-18 NOTE — Patient Instructions (Signed)
Medication Instructions:  Your physician recommends that you continue on your current medications as directed. Please refer to the Current Medication list given to you today. If you need a refill on your cardiac medications before your next appointment, please call your pharmacy.   Lab work: FASTING LP/CMET IN November  If you have labs (blood work) drawn today and your tests are completely normal, you will receive your results only by: [name] MyChart Message (if you have MyChart) OR . A paper copy in the mail If you have any lab test that is abnormal or we need to change your treatment, we will call you to review the results.  Testing/Procedures: NONE  Follow-Up: At [facility], you and your health needs are our priority.  As part of our continuing mission to provide you with exceptional heart care, we have created designated Provider Care Teams.  These Care Teams include your primary Cardiologist (physician) and Advanced Practice Providers (APPs -  Physician Assistants and Nurse Practitioners) who all work together to provide you with the care you need, when you need it. You will need a follow up appointment in 6 months.  Please call our office 2 months in advance to schedule this appointment.  You may see [facility] or one of the following Advanced Practice Providers on your designated Care Team:   Kerin Ransom, PA-C Roby Lofts, [place] . Sande Rives, PA-C

## 2018-06-30 DIAGNOSIS — M5416 Radiculopathy, lumbar region: Secondary | ICD-10-CM | POA: Diagnosis not present

## 2018-07-06 DIAGNOSIS — M62830 Muscle spasm of back: Secondary | ICD-10-CM | POA: Diagnosis not present

## 2018-07-06 DIAGNOSIS — M4726 Other spondylosis with radiculopathy, lumbar region: Secondary | ICD-10-CM | POA: Diagnosis not present

## 2018-07-14 DIAGNOSIS — M62830 Muscle spasm of back: Secondary | ICD-10-CM | POA: Diagnosis not present

## 2018-07-14 DIAGNOSIS — M4726 Other spondylosis with radiculopathy, lumbar region: Secondary | ICD-10-CM | POA: Diagnosis not present

## 2018-07-29 DIAGNOSIS — N816 Rectocele: Secondary | ICD-10-CM | POA: Diagnosis not present

## 2018-07-29 DIAGNOSIS — N9089 Other specified noninflammatory disorders of vulva and perineum: Secondary | ICD-10-CM | POA: Diagnosis not present

## 2018-07-29 DIAGNOSIS — N898 Other specified noninflammatory disorders of vagina: Secondary | ICD-10-CM | POA: Diagnosis not present

## 2018-07-30 DIAGNOSIS — M4726 Other spondylosis with radiculopathy, lumbar region: Secondary | ICD-10-CM | POA: Diagnosis not present

## 2018-07-30 DIAGNOSIS — M62830 Muscle spasm of back: Secondary | ICD-10-CM | POA: Diagnosis not present

## 2018-08-04 DIAGNOSIS — Z5181 Encounter for therapeutic drug level monitoring: Secondary | ICD-10-CM | POA: Diagnosis not present

## 2018-08-04 DIAGNOSIS — E785 Hyperlipidemia, unspecified: Secondary | ICD-10-CM | POA: Diagnosis not present

## 2018-08-04 LAB — COMPREHENSIVE METABOLIC PANEL
ALT: 13 IU/L (ref 0–32)
AST: 19 IU/L (ref 0–40)
Albumin/Globulin Ratio: 1.7 (ref 1.2–2.2)
Albumin: 4.2 g/dL (ref 3.6–4.8)
Alkaline Phosphatase: 75 IU/L (ref 39–117)
BUN/Creatinine Ratio: 24 (ref 12–28)
BUN: 19 mg/dL (ref 8–27)
Bilirubin Total: 0.4 mg/dL (ref 0.0–1.2)
CO2: 24 mmol/L (ref 20–29)
Calcium: 9.5 mg/dL (ref 8.7–10.3)
Chloride: 100 mmol/L (ref 96–106)
Creatinine, Ser: 0.78 mg/dL (ref 0.57–1.00)
GFR calc Af Amer: 95 mL/min/{1.73_m2} (ref >59)
GFR calc non Af Amer: 82 mL/min/{1.73_m2} (ref >59)
Globulin, Total: 2.5 g/dL (ref 1.5–4.5)
Glucose: 79 mg/dL (ref 65–99)
Potassium: 5.1 mmol/L (ref 3.5–5.2)
Sodium: 141 mmol/L (ref 134–144)
Total Protein: 6.7 g/dL (ref 6.0–8.5)

## 2018-08-04 LAB — LIPID PANEL
Chol/HDL Ratio: 3.7 ratio (ref 0.0–4.4)
Cholesterol, Total: 141 mg/dL (ref 100–199)
HDL: 38 mg/dL — ABNORMAL LOW (ref >39)
LDL Calculated: 75 mg/dL (ref 0–99)
Triglycerides: 138 mg/dL (ref 0–149)
VLDL Cholesterol Cal: 28 mg/dL (ref 5–40)

## 2018-08-11 DIAGNOSIS — N9089 Other specified noninflammatory disorders of vulva and perineum: Secondary | ICD-10-CM | POA: Diagnosis not present

## 2018-08-11 DIAGNOSIS — Z888 Allergy status to other drugs, medicaments and biological substances status: Secondary | ICD-10-CM | POA: Diagnosis not present

## 2018-08-11 DIAGNOSIS — N898 Other specified noninflammatory disorders of vagina: Secondary | ICD-10-CM | POA: Diagnosis not present

## 2018-08-12 DIAGNOSIS — M62830 Muscle spasm of back: Secondary | ICD-10-CM | POA: Diagnosis not present

## 2018-08-12 DIAGNOSIS — M4726 Other spondylosis with radiculopathy, lumbar region: Secondary | ICD-10-CM | POA: Diagnosis not present

## 2018-08-14 ENCOUNTER — Other Ambulatory Visit: Payer: Self-pay | Admitting: Cardiovascular Disease

## 2018-08-19 ENCOUNTER — Telehealth: Payer: Self-pay | Admitting: Medical

## 2018-08-19 ENCOUNTER — Ambulatory Visit: Payer: BLUE CROSS/BLUE SHIELD | Admitting: Medical

## 2018-08-19 ENCOUNTER — Encounter: Payer: Self-pay | Admitting: Medical

## 2018-08-19 VITALS — BP 117/73 | HR 63 | Temp 98.0°F | Resp 16 | Ht 63.0 in | Wt 107.8 lb

## 2018-08-19 DIAGNOSIS — R944 Abnormal results of kidney function studies: Secondary | ICD-10-CM | POA: Diagnosis not present

## 2018-08-19 DIAGNOSIS — B351 Tinea unguium: Secondary | ICD-10-CM

## 2018-08-19 DIAGNOSIS — E875 Hyperkalemia: Secondary | ICD-10-CM

## 2018-08-19 DIAGNOSIS — S90222A Contusion of left lesser toe(s) with damage to nail, initial encounter: Secondary | ICD-10-CM | POA: Diagnosis not present

## 2018-08-19 LAB — COMPREHENSIVE METABOLIC PANEL
ALT: 14 U/L (ref 0–35)
AST: 17 U/L (ref 0–37)
Albumin: 4.5 g/dL (ref 3.5–5.2)
Alkaline Phosphatase: 70 U/L (ref 39–117)
BUN: 24 mg/dL — ABNORMAL HIGH (ref 6–23)
CO2: 30 mEq/L (ref 19–32)
Calcium: 10.2 mg/dL (ref 8.4–10.5)
Chloride: 101 mEq/L (ref 96–112)
Creatinine, Ser: 0.82 mg/dL (ref 0.40–1.20)
GFR: 75.2 mL/min (ref >60.00)
Glucose, Bld: 76 mg/dL (ref 70–99)
Potassium: 5.4 mEq/L — ABNORMAL HIGH (ref 3.5–5.1)
Sodium: 137 mEq/L (ref 135–145)
Total Bilirubin: 0.5 mg/dL (ref 0.2–1.2)
Total Protein: 7.3 g/dL (ref 6.0–8.3)

## 2018-08-19 MED ORDER — CICLOPIROX 8 % EX SOLN: Freq: Every day | CUTANEOUS | 0 refills | Status: DC

## 2018-08-19 NOTE — Telephone Encounter (Signed)
Future metabolic panel placed for high potassium.

## 2018-08-19 NOTE — Patient Instructions (Addendum)
It appears that you have some probable toenail fungus as well as a small subungual hematoma.  The hematoma might be secondary to change in gait related to your back pain and wearing a different shoe.  For toenail fungus you did not like I did have you take oral medication so I did prescribe you Penlac.  Hopefully this would help.  For subungual hematomas this may resolve by itself as your toenail grows.  If toenail features change or worsen then could refer you to a dermatologist.  For history of decreased GFR and having to use NSAIDs for your back pain, will get repeat metabolic panel.  Try to limit the diclofenac use just to 1 tablet a day.  Would asked that you schedule CPE in 2 to 3 months.

## 2018-08-19 NOTE — Progress Notes (Signed)
Subjective:    Patient ID: Katrina Walters, female    DOB: [birth year], 62 y.o.   MRN: [id number]  HPI  Pt in for left great toe nail discoloration to her left toe area. No toe pain or foot pain. She has seen toe nail change over past weeks.  Pt has no fall or trauma. Pt has been walking daily. Speculates gait change may have irritated nail.  Pt been using new tennis shoe to help with some recent back pain. Pt went to PT and had cortisone injection for back pain. She states only residual pain in lower back at end of the day. She is still using diclofenac. Reviewed Patient instructions from specialist visit. See below.  MRI from 05/21/18: L5/S1 left paracentral disc herniation with minimal effacement of thecal sac. Disc material closely associated traversing left S1 nerve roots.  We have discussed an epidural steroid injection to your L5-S1 area as this will likely help with your radicular leg pain. If you develop weakness, numbness, or worsening pain and fail to improve after injections and physical therapy, surgery may be beneficial. Surgery would likely be a left L5-S1 laminotomy for decompression of stenosis- spine narrowing versus. You may need an EMG nerve conduction study of both your legs as this is a study that can help discern which nerves are involved.    Review of Systems  Constitutional: Negative for chills, fatigue and fever.  Respiratory: Negative for cough, chest tightness, shortness of breath and wheezing.   Cardiovascular: Negative for chest pain and palpitations.  Gastrointestinal: Negative for abdominal pain.  Genitourinary: Negative for difficulty urinating, dysuria, pelvic pain and urgency.  Musculoskeletal: Negative for arthralgias and back pain.  Skin: Negative for rash.       Left great toe nail discoloration.  Neurological: Negative for dizziness, seizures, syncope, weakness and headaches.  Hematological: Negative for adenopathy. Does not bruise/bleed easily.    Psychiatric/Behavioral: Negative for behavioral problems and decreased concentration.    Past Medical History:  Diagnosis Date  . Eating disorder   . Genital warts   . Hyperlipidemia   . 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 Marital status: Married    Spouse name: Not on file  . Number of children: Not on file  . Years of education: Not on file  . Highest education level: Not on file  Occupational History  . Occupation: unemployed at the time  Social Needs  . Financial resource strain: Not on file  . Food insecurity:    Worry: Not on file    Inability: Not on file  . Transportation needs:    Medical: Not on file    Non-medical: Not on file  Tobacco Use  . Smoking status: Never Smoker  . Smokeless tobacco: Never Used  Substance and Sexual Activity  . Alcohol use: No    Alcohol/week: 0.0 standard drinks  . Drug use: No  . Sexual activity: Not on file  Lifestyle  . Physical activity:    Days per week: Not on file    Minutes per session: Not on file  . Stress: Not on file  Relationships  . Social connections:    Talks on phone: Not on file    Gets together: Not on file    Attends religious service: Not on file    Active member of club or organization: Not on file    Attends meetings of clubs or organizations: Not on file  Relationship status: Not on file  . Intimate partner violence:    Fear of current or ex partner: Not on file    Emotionally abused: Not on file    Physically abused: Not on file    Forced sexual activity: Not on file  Other Topics Concern  . Not on file  Social History Narrative  . Not on file    Past Surgical History:  Procedure Laterality Date  . BREAST REDUCTION SURGERY  2006  . CARDIAC CATHETERIZATION N/A 07/17/2016   Procedure: Left Heart Cath and Coronary Angiography;  Surgeon: Burnell Blanks, MD;  Location: [place] [facility];  Service: Cardiovascular;  Laterality: N/A;    Family  History  Problem Relation Age of Onset  . Heart disease Mother        died at 23 of heart attack  . Hypertension Mother   . Heart attack Mother   . Heart disease Father        had heart attack at age 27  . Heart attack Father   . Cancer Paternal Aunt     Allergies  Allergen Reactions  . Amoxicillin     Had fever with flu like symptoms. Has patient had a PCN reaction causing immediate rash, facial/tongue/throat swelling, SOB or lightheadedness with hypotension: no Has patient had a PCN reaction causing severe rash involving mucus membranes or skin necrosis: no Has patient had a PCN reaction that required hospitalization no Has patient had a PCN reaction occurring within the last 10 years: yes If all of the above answers are "NO", then may proceed with Cephalosporin use.  [name] Clopidogrel & Aspirin Itching  . Atorvastatin     Myalgia  . Crestor [Rosuvastatin Calcium]     MYALGIA     Current Outpatient Medications on File Prior to Visit  Medication Sig Dispense Refill  . aspirin EC 81 MG tablet Take 81 mg by mouth daily.    . colchicine 0.6 MG tablet TAKE 1 TABLET BY MOUTH TWICE A DAY 60 tablet 0  . diclofenac (VOLTAREN) 75 MG EC tablet Take 75 mg by mouth 2 (two) times daily.    . diclofenac sodium (VOLTAREN) 1 % GEL Apply topically 4 (four) times daily.    . Estradiol [facility]) Place vaginally.    . Evolocumab (REPATHA SURECLICK) 341 MG/ML SOAJ Inject 140 mg into the skin every 14 (fourteen) days. 2 pen 12  . Melatonin 3 MG CAPS Take 1 capsule by mouth at bedtime.     . methocarbamol (ROBAXIN) 500 MG tablet TAKE 2 TABLETS BY MOUTH TWICE A DAY AS NEEDED 60 tablet 0  . metoprolol succinate (TOPROL-XL) 25 MG 24 hr tablet TAKE 1 TABLET BY MOUTH EVERY DAY 90 tablet 3  . Misc Natural Products (COMPLETE MENOPAUSE HEALTH PO) Take 1 Dose by mouth daily.     . nitrofurantoin, macrocrystal-monohydrate, (MACROBID) 100 MG capsule Take one pill daily for suppression    . omeprazole  (PRILOSEC) 40 MG capsule TAKE 1 CAPSULE(S) BY MOUTH ONCE A DAY FOR 1 DAY  0  . Polyethyl Glycol-Propyl Glycol (SYSTANE OP) Place 1 drop into both eyes daily as needed. Dry eyes    . PREMARIN vaginal cream Place 0.5 g vaginally 2 (two) times a week.  4  . ticagrelor (BRILINTA) 90 MG TABS tablet Take 1 tablet (90 mg total) by mouth 2 (two) times daily. 60 tablet 5  . triamcinolone ointment (KENALOG) 0.5 % Apply thin layer TID to affected area x 1wk,  then BID x 1wk, then daily x 1wk then stop     No current facility-administered medications on file prior to visit.     BP 117/73   Pulse 63   Temp 98 F (36.7 C) (Oral)   Resp 16   Ht 5\' 3"  (1.6 m)   Wt 107 lb 12.5 oz (48.9 kg)   LMP  (LMP Unknown)   SpO2 100%   BMI 19.09 kg/m       Objective:   Physical Exam  General Mental Status- Alert. General Appearance- Not in acute distress.      Chest and Lung Exam Auscultation: Breath Sounds:-Normal.  Cardiovascular Auscultation:Rythm- Regular. Murmurs & Other Heart Sounds:Auscultation of the heart reveals- No Murmurs.  Abdomen Inspection:-Inspeection Normal. Palpation/Percussion:Note:No mass. Palpation and Percussion of the abdomen reveal- Non Tender, Non Distended + BS, no rebound or guarding.   Neurologic Cranial Nerve exam:- CN III-XII intact(No nystagmus), symmetric smile. Strength:- 5/5 equal and symmetric strength both upper and lower extremities.  Left foot- no tenderness or swelling.  Left great toe- faint yellow discoloring, nail disfigured and moderate portion subungal hematoma.    Assessment & Plan:  It appears that you have some probable toenail fungus as well as a small subungual hematoma.  The hematoma might be secondary to change in gait related to your back pain and wearing a different shoe.  For toenail fungus you did not like I did have you take oral medication so I did prescribe you Penlac.  Hopefully this would help.  For subungual hematomas this may  resolve by itself as your toenail grows.  If toenail features change or worsen then could refer you to a dermatologist.  For history of decreased GFR and having to use NSAIDs for your back pain, will get repeat metabolic panel.  Try to limit the diclofenac use just to 1 tablet a day.  Would asked that you schedule CPE in 2 to 3 months.

## 2018-08-27 DIAGNOSIS — N952 Postmenopausal atrophic vaginitis: Secondary | ICD-10-CM | POA: Diagnosis not present

## 2018-08-27 DIAGNOSIS — N763 Subacute and chronic vulvitis: Secondary | ICD-10-CM | POA: Diagnosis not present

## 2018-08-27 DIAGNOSIS — N390 Urinary tract infection, site not specified: Secondary | ICD-10-CM | POA: Diagnosis not present

## 2018-09-22 ENCOUNTER — Telehealth: Payer: Self-pay | Admitting: Medical

## 2018-09-22 ENCOUNTER — Ambulatory Visit (INDEPENDENT_AMBULATORY_CARE_PROVIDER_SITE_OTHER): Payer: Commercial Managed Care - PPO | Admitting: Medical

## 2018-09-22 ENCOUNTER — Ambulatory Visit (HOSPITAL_BASED_OUTPATIENT_CLINIC_OR_DEPARTMENT_OTHER)
Admission: RE | Admit: 2018-09-22 | Discharge: 2018-09-22 | Disposition: A | Discharge status: Home / Self Care | Payer: Commercial Managed Care - PPO | Source: Ambulatory Visit | Attending: Medical | Admitting: Medical

## 2018-09-22 ENCOUNTER — Encounter: Payer: Self-pay | Admitting: Medical

## 2018-09-22 VITALS — BP 121/66 | HR 66 | Temp 98.1°F | Resp 16 | Ht 63.0 in | Wt 107.0 lb

## 2018-09-22 DIAGNOSIS — Z0000 Encounter for general adult medical examination without abnormal findings: Secondary | ICD-10-CM

## 2018-09-22 DIAGNOSIS — M810 Age-related osteoporosis without current pathological fracture: Secondary | ICD-10-CM

## 2018-09-22 DIAGNOSIS — Z1239 Encounter for other screening for malignant neoplasm of breast: Secondary | ICD-10-CM

## 2018-09-22 DIAGNOSIS — M545 Low back pain, unspecified: Secondary | ICD-10-CM

## 2018-09-22 DIAGNOSIS — G8929 Other chronic pain: Secondary | ICD-10-CM

## 2018-09-22 DIAGNOSIS — R1013 Epigastric pain: Secondary | ICD-10-CM

## 2018-09-22 DIAGNOSIS — K635 Polyp of colon: Secondary | ICD-10-CM

## 2018-09-22 LAB — CBC WITH DIFFERENTIAL/PLATELET
Basophils Absolute: 0.1 10*3/uL (ref 0.0–0.1)
Basophils Relative: 0.6 % (ref 0.0–3.0)
Eosinophils Absolute: 0.3 10*3/uL (ref 0.0–0.7)
Eosinophils Relative: 3 % (ref 0.0–5.0)
HCT: 42.9 % (ref 36.0–46.0)
Hemoglobin: 14.2 g/dL (ref 12.0–15.0)
Lymphocytes Relative: 21.7 % (ref 12.0–46.0)
Lymphs Abs: 2 10*3/uL (ref 0.7–4.0)
MCHC: 33.1 g/dL (ref 30.0–36.0)
MCV: 87.7 fl (ref 78.0–100.0)
Monocytes Absolute: 0.7 10*3/uL (ref 0.1–1.0)
Monocytes Relative: 7.6 % (ref 3.0–12.0)
Neutro Abs: 6.3 10*3/uL (ref 1.4–7.7)
Neutrophils Relative %: 67.1 % (ref 43.0–77.0)
Platelets: 387 10*3/uL (ref 150.0–400.0)
RBC: 4.89 Mil/uL (ref 3.87–5.11)
RDW: 16.3 % — ABNORMAL HIGH (ref 11.5–15.5)
WBC: 9.4 10*3/uL (ref 4.0–10.5)

## 2018-09-22 LAB — COMPREHENSIVE METABOLIC PANEL
ALT: 8 U/L (ref 0–35)
AST: 12 U/L (ref 0–37)
Albumin: 4.1 g/dL (ref 3.5–5.2)
Alkaline Phosphatase: 79 U/L (ref 39–117)
BUN: 20 mg/dL (ref 6–23)
CO2: 32 mEq/L (ref 19–32)
Calcium: 9.8 mg/dL (ref 8.4–10.5)
Chloride: 100 mEq/L (ref 96–112)
Creatinine, Ser: 0.81 mg/dL (ref 0.40–1.20)
GFR: 76.25 mL/min (ref >60.00)
Glucose, Bld: 82 mg/dL (ref 70–99)
Potassium: 5.4 mEq/L — ABNORMAL HIGH (ref 3.5–5.1)
Sodium: 136 mEq/L (ref 135–145)
Total Bilirubin: 0.5 mg/dL (ref 0.2–1.2)
Total Protein: 7.1 g/dL (ref 6.0–8.3)

## 2018-09-22 LAB — LIPID PANEL
Cholesterol: 107 mg/dL (ref 0–200)
HDL: 30.9 mg/dL — ABNORMAL LOW (ref >39.00)
LDL Cholesterol: 49 mg/dL (ref 0–99)
NonHDL: 75.72
Total CHOL/HDL Ratio: 3
Triglycerides: 132 mg/dL (ref 0.0–149.0)
VLDL: 26.4 mg/dL (ref 0.0–40.0)

## 2018-09-22 MED ORDER — SODIUM POLYSTYRENE SULFONATE 15 GM/60ML PO SUSP: ORAL | 0 refills | Status: DC

## 2018-09-22 MED ORDER — MELOXICAM 7.5 MG PO TABS: ORAL_TABLET | ORAL | 0 refills | Status: DC

## 2018-09-22 MED ORDER — OMEPRAZOLE 40 MG PO CPDR: 40.0000 mg | DELAYED_RELEASE_CAPSULE | Freq: Every day | ORAL | 3 refills | Status: DC

## 2018-09-22 NOTE — Patient Instructions (Addendum)
For you wellness exam today I have ordered cbc, cmp, lipid panel, and ua.  Flu vaccine declined.  Recommend exercise and healthy diet.  We will let you know lab results as they come in.  Follow up date appointment will be determined after lab review.   Regarding your epigastric pain when you use NSAIDs, I want you to continue omeprazole daily.  I did give you a refill.  Stop diclofenac and will see if you do better with low-dose meloxicam 7.5 mg.  You do have option to take 2 tablets of meloxicam in 1 day if you have more severe back pain day.  Continue to see back specialist and hopefully your epidurals will work better this time.  If that stops your pain then then stop NSAIDs.  Will get I fob stool test today to make sure no blood in your stool.  DEXA scan order placed today and mammogram order placed today.  Please go downstairs to get this scheduled.  Colonoscopy referral placed today as well.  Follow-up date to be determined after lab review.    Preventive Care 40-64 Years, Female Preventive care refers to lifestyle choices and visits with your health care provider that can promote health and wellness. What does preventive care include?   A yearly physical exam. This is also called an annual well check.  Dental exams once or twice a year.  Routine eye exams. Ask your health care provider how often you should have your eyes checked.  Personal lifestyle choices, including: ? Daily care of your teeth and gums. ? Regular physical activity. ? Eating a healthy diet. ? Avoiding tobacco and drug use. ? Limiting alcohol use. ? Practicing safe sex. ? Taking low-dose aspirin daily starting at age 70. ? Taking vitamin and mineral supplements as recommended by your health care provider. What happens during an annual well check? The services and screenings done by your health care provider during your annual well check will depend on your age, overall health, lifestyle risk factors,  and family history of disease. Counseling Your health care provider may ask you questions about your:  Alcohol use.  Tobacco use.  Drug use.  Emotional well-being.  Home and relationship well-being.  Sexual activity.  Eating habits.  Work and work Statistician.  Method of birth control.  Menstrual cycle.  Pregnancy history. Screening You may have the following tests or measurements:  Height, weight, and BMI.  Blood pressure.  Lipid and cholesterol levels. These may be checked every 5 years, or more frequently if you are over 59 years old.  Skin check.  Lung cancer screening. You may have this screening every year starting at age 36 if you have a 30-pack-year history of smoking and currently smoke or have quit within the past 15 years.  Colorectal cancer screening. All adults should have this screening starting at age 18 and continuing until age 41. Your health care provider may recommend screening at age 64. You will have tests every 1-10 years, depending on your results and the type of screening test. People at increased risk should start screening at an earlier age. Screening tests may include: ? Guaiac-based fecal occult blood testing. ? Fecal immunochemical test (FIT). ? Stool DNA test. ? Virtual colonoscopy. ? Sigmoidoscopy. During this test, a flexible tube with a tiny camera (sigmoidoscope) is used to examine your rectum and lower colon. The sigmoidoscope is inserted through your anus into your rectum and lower colon. ? Colonoscopy. During this test, a long, thin, flexible  tube with a tiny camera (colonoscope) is used to examine your entire colon and rectum.  Hepatitis C blood test.  Hepatitis B blood test.  Sexually transmitted disease (STD) testing.  Diabetes screening. This is done by checking your blood sugar (glucose) after you have not eaten for a while (fasting). You may have this done every 1-3 years.  Mammogram. This may be done every 1-2 years.  Talk to your health care provider about when you should start having regular mammograms. This may depend on whether you have a family history of breast cancer.  BRCA-related cancer screening. This may be done if you have a family history of breast, ovarian, tubal, or peritoneal cancers.  Pelvic exam and Pap test. This may be done every 3 years starting at age 64. Starting at age 68, this may be done every 5 years if you have a Pap test in combination with an HPV test.  Bone density scan. This is done to screen for osteoporosis. You may have this scan if you are at high risk for osteoporosis. Discuss your test results, treatment options, and if necessary, the need for more tests with your health care provider. Vaccines Your health care provider may recommend certain vaccines, such as:  Influenza vaccine. This is recommended every year.  Tetanus, diphtheria, and acellular pertussis (Tdap, Td) vaccine. You may need a Td booster every 10 years.  Varicella vaccine. You may need this if you have not been vaccinated.  Zoster vaccine. You may need this after age 67.  Measles, mumps, and rubella (MMR) vaccine. You may need at least one dose of MMR if you were born in 1957 or later. You may also need a second dose.  Pneumococcal 13-valent conjugate (PCV13) vaccine. You may need this if you have certain conditions and were not previously vaccinated.  Pneumococcal polysaccharide (PPSV23) vaccine. You may need one or two doses if you smoke cigarettes or if you have certain conditions.  Meningococcal vaccine. You may need this if you have certain conditions.  Hepatitis A vaccine. You may need this if you have certain conditions or if you travel or work in places where you may be exposed to hepatitis A.  Hepatitis B vaccine. You may need this if you have certain conditions or if you travel or work in places where you may be exposed to hepatitis B.  Haemophilus influenzae type b (Hib) vaccine. You may  need this if you have certain conditions. Talk to your health care provider about which screenings and vaccines you need and how often you need them. This information is not intended to replace advice given to you by your health care provider. Make sure you discuss any questions you have with your health care provider. Document Released: 09/21/2015 Document Revised: 10/15/2017 Document Reviewed: 06/26/2015 Elsevier Interactive Patient Education  2019 [employer].

## 2018-09-22 NOTE — Telephone Encounter (Signed)
Sent in rx kayexalate.

## 2018-09-22 NOTE — Telephone Encounter (Signed)
Opened to review 

## 2018-09-22 NOTE — Telephone Encounter (Signed)
Pt came in for cpe/wellness. I put in urine order. Did she ever give? If not please get her back in just to give sample. If did give please result that.

## 2018-09-22 NOTE — Progress Notes (Signed)
Subjective:    Patient ID: Katrina Walters, female    DOB: June 13, 1956, 63 y.o.   MRN: [id number]  HPI  Pt is here for cpe.  She is fasting.  History of osteopenia and osteoporosis.   Pt needs screening mammogram.  Pt declines flu vaccine.  6 years ago pt had colonoscopy. Pt had polyp and told to repeat in 3 years. Pt had test done in [place].  Pt states he last papmsear was normal last year. She states she seen gyn every year. States Dr. Dema Severin office did.     Review of Systems  Constitutional: Negative for chills, fatigue and fever.  HENT: Negative for congestion, drooling and ear discharge.   Eyes: Negative for pain, discharge and itching.  Respiratory: Negative for cough, chest tightness, shortness of breath and wheezing.   Cardiovascular: Negative for chest pain and palpitations.  Gastrointestinal: Positive for abdominal pain. Negative for abdominal distention, anal bleeding, blood in stool, constipation, diarrhea and rectal pain.       Some history of upset stomach with use of dilcofenac. Not severe. No blood in stool and no blood in stool.   No history of ulcers in her stomach.  Pt states when stops diclofenac has no pain.  Low dose ibuprofen does not help at all.  Pt is not on brilenta.  Is on baby aspirin.   In past she states fosamax hurt her stomach.  Genitourinary: Negative for dyspareunia, dysuria, flank pain, frequency, genital sores and hematuria.  Musculoskeletal: Positive for back pain.       Pt will see back specialist next week.  Epidural injections did seem to help.    Skin: Positive for rash.       No itching.  Neurological: Negative for dizziness, seizures, numbness and headaches.  Hematological: Negative for adenopathy. Does not bruise/bleed easily.  Psychiatric/Behavioral: Negative for behavioral problems, confusion, dysphoric mood, sleep disturbance and suicidal ideas. The patient is not nervous/anxious.        Objective:   Physical  Exam  General Mental Status- Alert. General Appearance- Not in acute distress.   Skin General: Color- Normal Color. Moisture- Normal Moisture.  Neck Carotid Arteries- Normal color. Moisture- Normal Moisture. No carotid bruits. No JVD.  Chest and Lung Exam Auscultation: Breath Sounds:-Normal.  Cardiovascular Auscultation:Rythm- Regular. Murmurs & Other Heart Sounds:Auscultation of the heart reveals- No Murmurs.  Abdomen Inspection:-Inspeection Normal. Palpation/Percussion:Note:No mass. Palpation and Percussion of the abdomen reveal- Non Tender, Non Distended + BS, no rebound or guarding.   Neurologic Cranial Nerve exam:- CN III-XII intact(No nystagmus), symmetric smile. Strength:- 5/5 equal and symmetric strength both upper and lower extremities.      Assessment & Plan:  For you wellness exam today I have ordered cbc, cmp, lipid panel, and ua.  Flu vaccine declined.  Recommend exercise and healthy diet.  We will let you know lab results as they come in.  Follow up date appointment will be determined after lab review.   Regarding your epigastric pain when you use NSAIDs, I want you to continue omeprazole daily.  I did give you a refill.  Stop diclofenac and will see if you do better with low-dose meloxicam 7.5 mg.  You do have option to take 2 tablets of meloxicam in 1 day if you have more severe back pain day.  Continue to see back specialist and hopefully your epidurals will work better this time.  If that stops your pain then then stop NSAIDs.  Will get I fob stool test  today to make sure no blood in your stool.  DEXA scan order placed today and mammogram order placed today.  Please go downstairs to get this scheduled.  Colonoscopy referral placed today as well.  Follow-up date to be determined after lab review.   [id number] charge also placed as we did discuss and I did review back pain and abd pain history. Plan made above.  Mackie Pai, PA-C

## 2018-09-22 NOTE — Telephone Encounter (Signed)
Left message for pt to call back. Okay for PEC to give information.

## 2018-09-23 ENCOUNTER — Telehealth: Payer: Self-pay | Admitting: Medical

## 2018-09-23 DIAGNOSIS — M810 Age-related osteoporosis without current pathological fracture: Secondary | ICD-10-CM

## 2018-09-23 MED ORDER — DENOSUMAB 60 MG/ML ~~LOC~~ SOSY: 60.0000 mg | PREFILLED_SYRINGE | SUBCUTANEOUS | 0 refills | Status: DC

## 2018-09-23 NOTE — Telephone Encounter (Signed)
I placed prolia injection order. I wrote it as a prescription. Does she get the med from pharmacy and then we give it to her in office or do we have some in stock. Not sure on the process. I think their is a prior authorization. She describes intolerance to oral bisphosphates.   Also want her to get vit D level. Future order placed.

## 2018-09-27 ENCOUNTER — Telehealth: Payer: Self-pay | Admitting: Cardiovascular Disease

## 2018-09-27 MED ORDER — EVOLOCUMAB 140 MG/ML ~~LOC~~ SOAJ: 140.0000 mg | SUBCUTANEOUS | 11 refills | Status: DC

## 2018-09-27 NOTE — Telephone Encounter (Signed)
New message     Pt c/o medication issue:  1. Name of Medication: Repatha   2. How are you currently taking this medication (dosage and times per day)? 1 shot every 2 weeks   3. Are you having a reaction (difficulty breathing--STAT)? No   4. What is your medication issue? Pt stated that repatha is taking off the market and needs prescribed another medication [facility] to [id number] - [place], [place]

## 2018-10-01 NOTE — Telephone Encounter (Signed)
Verify patient in [facility] portal.

## 2018-10-05 NOTE — Telephone Encounter (Signed)
Prior Authorization for prolia sent to pharmacy. Waiting on determination.

## 2018-10-11 NOTE — Telephone Encounter (Signed)
Talked to pharmacist and patient. Rx available and processed. Co-pay $120 dollar but patient has copay card available.   Patient will stop by the store tomorrow to pick up

## 2018-10-11 NOTE — Telephone Encounter (Signed)
Follow up    Patient is calling back in reference to the [facility]. [facility] so she needs to know what to take in the meantime. Please call to discuss.

## 2018-10-13 DIAGNOSIS — N9089 Other specified noninflammatory disorders of vulva and perineum: Secondary | ICD-10-CM | POA: Diagnosis not present

## 2018-10-13 DIAGNOSIS — N952 Postmenopausal atrophic vaginitis: Secondary | ICD-10-CM | POA: Diagnosis not present

## 2018-10-28 DIAGNOSIS — M5416 Radiculopathy, lumbar region: Secondary | ICD-10-CM | POA: Diagnosis not present

## 2018-11-04 DIAGNOSIS — N952 Postmenopausal atrophic vaginitis: Secondary | ICD-10-CM | POA: Diagnosis not present

## 2018-11-04 DIAGNOSIS — N8110 Cystocele, unspecified: Secondary | ICD-10-CM | POA: Diagnosis not present

## 2018-11-04 DIAGNOSIS — N816 Rectocele: Secondary | ICD-10-CM | POA: Diagnosis not present

## 2018-11-11 DIAGNOSIS — M5106 Intervertebral disc disorders with myelopathy, lumbar region: Secondary | ICD-10-CM | POA: Diagnosis not present

## 2018-11-11 DIAGNOSIS — M5417 Radiculopathy, lumbosacral region: Secondary | ICD-10-CM | POA: Diagnosis not present

## 2018-11-19 ENCOUNTER — Other Ambulatory Visit: Payer: Self-pay | Admitting: Cardiovascular Disease

## 2018-11-29 DIAGNOSIS — M5416 Radiculopathy, lumbar region: Secondary | ICD-10-CM | POA: Diagnosis not present

## 2018-12-15 ENCOUNTER — Telehealth: Payer: Self-pay | Admitting: Cardiovascular Disease

## 2018-12-15 NOTE — Telephone Encounter (Signed)
Talked to patient this morning.  She will like to try new medication Nexletol. History on intolerance to statins,and not interested n trial with Praluent.   ScR = 0.81 on 09/2018. No history of gout and not hx of elevated LFTs.   Will send Rx to prefer pharmacy and order follow up blood work in 3 months

## 2018-12-15 NOTE — Telephone Encounter (Signed)
New Message:   Patient she having some trouble with some medications.

## 2018-12-15 NOTE — Telephone Encounter (Signed)
Nexletol 180mg  [facility]

## 2018-12-15 NOTE — Telephone Encounter (Signed)
Called patient, she states that she was on the Repatha injections, and they caused her to itch and break out in a rash. She was suppose to take the injection on Saturday- but did not, the itching has gone away, the rash is much better. Patient would like a alternative cholesterol pill that she could take instead as she does not want to continue this. I advised I would send a message to advise.   Routed to PharmD and Dr.Albin to make her aware.

## 2018-12-16 ENCOUNTER — Other Ambulatory Visit: Payer: Self-pay

## 2018-12-16 NOTE — Telephone Encounter (Signed)
Will forward to Pharm D for review  

## 2018-12-21 NOTE — Telephone Encounter (Signed)
Waiting for additional information from drug rep.  Patient aware and will wait for our call.

## 2018-12-27 ENCOUNTER — Other Ambulatory Visit: Payer: Self-pay | Admitting: Pharmacist Clinician (PhC)/ Clinical Pharmacy Specialist

## 2018-12-27 MED ORDER — BEMPEDOIC ACID 180 MG PO TABS: 180.0000 mg | ORAL_TABLET | Freq: Every day | ORAL | 5 refills | Status: DC

## 2018-12-27 NOTE — Telephone Encounter (Signed)
Per information from [facility] drug rep, pts with commercial insurance should be able to download Copay card.  When they take it to the pharmacy, they should get the first month for $10.  This will give [place] 30 days to get a prior authorization done.    Gave information to patient, she will call back if any problems getting card or medication from pharmacy.

## 2019-01-03 DIAGNOSIS — M5416 Radiculopathy, lumbar region: Secondary | ICD-10-CM | POA: Diagnosis not present

## 2019-01-05 ENCOUNTER — Other Ambulatory Visit: Payer: Self-pay | Admitting: Medical

## 2019-01-09 NOTE — Telephone Encounter (Signed)
Rx. [place] sent to pt pharmacy.

## 2019-01-12 MED ORDER — PRAVASTATIN SODIUM 10 MG PO TABS: 10.0000 mg | ORAL_TABLET | Freq: Every day | ORAL | 5 refills | Status: DC

## 2019-01-12 NOTE — Telephone Encounter (Signed)
Nexletol PA denied at this time.  Insurance requires trial with at least 3 statin drugs, patient has only tried atorvastatin and rosuvastatin.  Will have her start pravastatin 10 mg once daily.  She should continue with her Nexletol 180 mg once daily dose.  She is to call in 2-3 weeks to let us know if she is tolerating the pravastatin or not, then we can re-apply for the PA on Nexletol.  Patient voiced understanding.

## 2019-01-12 NOTE — Addendum Note (Signed)
Addended byBarry Brunner on: 01/12/2019 03:28 PM   Modules accepted: Orders

## 2019-01-13 ENCOUNTER — Telehealth: Payer: Self-pay

## 2019-01-13 NOTE — Telephone Encounter (Signed)
Virtual Visit Pre-Appointment Phone Call [place] PHONE  "(Name), I am calling you today to discuss your upcoming appointment. We are currently trying to limit exposure to the virus that causes COVID-19 by seeing patients at home rather than in the office."  1. "What is the BEST phone number to call the day of the visit?" - include this in appointment notes  2. "Do you have or have access to (through a family member/friend) a smartphone with video capability that we can use for your visit?" a. If yes - list this number in appt notes as "cell" (if different from BEST phone #) and list the appointment type as a VIDEO visit in appointment notes b. If no - list the appointment type as a PHONE visit in appointment notes  3. Confirm consent - "In the setting of the current Covid19 crisis, you are scheduled for a (phone or video) visit with your provider on (date) at (time).  Just as we do with many in-office visits, in order for you to participate in this visit, we must obtain consent.  If you'd like, I can send this to your mychart (if signed up) or email for you to review.  Otherwise, I can obtain your verbal consent now.  All virtual visits are billed to your insurance company just like a normal visit would be.  By agreeing to a virtual visit, we'd like you to understand that the technology does not allow for your provider to perform an examination, and thus may limit your provider's ability to fully assess your condition. If your provider identifies any concerns that need to be evaluated in person, we will make arrangements to do so.  Finally, though the technology is pretty good, we cannot assure that it will always work on either your or our end, and in the setting of a video visit, we may have to convert it to a phone-only visit.  In either situation, we cannot ensure that we have a secure connection.  Are you willing to proceed?" STAFF: Did the patient verbally acknowledge consent to  telehealth visit? Document YES/NO here:   4. Advise patient to be prepared - "Two hours prior to your appointment, go ahead and check your blood pressure, pulse, oxygen saturation, and your weight (if you have the equipment to check those) and write them all down. When your visit starts, your provider will ask you for this information. If you have an Apple Watch or Kardia device, please plan to have heart rate information ready on the day of your appointment. Please have a pen and paper handy nearby the day of the visit as well."  5. Give patient instructions for MyChart download to smartphone OR Doximity/Doxy.me as below if video visit (depending on what platform provider is using)  6. Inform patient they will receive a phone call 15 minutes prior to their appointment time (may be from unknown caller ID) so they should be prepared to answer    TELEPHONE CALL NOTE  Katrina Walters has been deemed a candidate for a follow-up tele-health visit to limit community exposure during the Covid-19 pandemic. I spoke with the patient via phone to ensure availability of phone/video source, confirm preferred email & phone number, and discuss instructions and expectations.  I reminded Katrina Walters to be prepared with any vital sign and/or heart rhythm information that could potentially be obtained via home monitoring, at the time of her visit. I reminded Katrina Walters to expect a phone call prior  to her visit.  [place] 01/13/2019 2:35 PM   INSTRUCTIONS FOR DOWNLOADING THE MYCHART APP TO SMARTPHONE  - The patient must first make sure to have activated MyChart and know their login information - If Apple, go to [employer] and type in MyChart in the search bar and download the app. If Android, ask patient to go to [employer] and type in [place] in the search bar and download the app. The app is free but as with any other app downloads, their phone may require them to verify saved payment information or  Apple/Android password.  - The patient will need to then log into the app with their MyChart username and password, and select [facility] as their healthcare provider to link the account. When it is time for your visit, go to the MyChart app, find appointments, and click Begin Video Visit. Be sure to Select Allow for your device to access the Microphone and Camera for your visit. You will then be connected, and your provider will be with you shortly.  **If they have any issues connecting, or need assistance please contact MyChart service desk [phone])  If using a computer, in order to ensure the best quality for their visit they will need to use either of the following Internet Browsers: [employer], or Google Chrome  IF USING DOXIMITY or DOXY.ME - The patient will receive a link just prior to their visit by text.     FULL LENGTH CONSENT FOR TELE-HEALTH VISIT   I hereby voluntarily request, consent and authorize [facility] and its employed or contracted physicians, physician assistants, nurse practitioners or other licensed health care professionals (the Practitioner), to provide me with telemedicine health care services (the "Services") as deemed necessary by the treating Practitioner. I acknowledge and consent to receive the Services by the Practitioner via telemedicine. I understand that the telemedicine visit will involve communicating with the Practitioner through live audiovisual communication technology and the disclosure of certain medical information by electronic transmission. I acknowledge that I have been given the opportunity to request an in-person assessment or other available alternative prior to the telemedicine visit and am voluntarily participating in the telemedicine visit.  I understand that I have the right to withhold or withdraw my consent to the use of telemedicine in the course of my care at any time, without affecting my right to future care or  treatment, and that the Practitioner or I may terminate the telemedicine visit at any time. I understand that I have the right to inspect all information obtained and/or recorded in the course of the telemedicine visit and may receive copies of available information for a reasonable fee.  I understand that some of the potential risks of receiving the Services via telemedicine include:  [name] Delay or interruption in medical evaluation due to technological equipment failure or disruption; . Information transmitted may not be sufficient (e.g. poor resolution of images) to allow for appropriate medical decision making by the Practitioner; and/or  . In rare instances, security protocols could fail, causing a breach of personal health information.  Furthermore, I acknowledge that it is my responsibility to provide information about my medical history, conditions and care that is complete and accurate to the best of my ability. I acknowledge that Practitioner's advice, recommendations, and/or decision may be based on factors not within their control, such as incomplete or inaccurate data provided by me or distortions of diagnostic images or specimens that may result from electronic  transmissions. I understand that the practice of medicine is not an exact science and that Practitioner makes no warranties or guarantees regarding treatment outcomes. I acknowledge that I will receive a copy of this consent concurrently upon execution via email to the email address I last provided but may also request a printed copy by calling the office of [facility].    I understand that my insurance will be billed for this visit.   I have read or had this consent read to me. . I understand the contents of this consent, which adequately explains the benefits and risks of the Services being provided via telemedicine.  . I have been provided ample opportunity to ask questions regarding this consent and the Services and have had my  questions answered to my satisfaction. . I give my informed consent for the services to be provided through the use of telemedicine in my medical care  By participating in this telemedicine visit I agree to the above.

## 2019-01-17 ENCOUNTER — Telehealth: Payer: Self-pay | Admitting: Cardiovascular Disease

## 2019-01-18 ENCOUNTER — Telehealth (INDEPENDENT_AMBULATORY_CARE_PROVIDER_SITE_OTHER): Payer: Commercial Managed Care - PPO | Admitting: Cardiovascular Disease

## 2019-01-18 ENCOUNTER — Encounter: Payer: Self-pay | Admitting: Cardiovascular Disease

## 2019-01-18 ENCOUNTER — Encounter: Payer: Self-pay | Admitting: *Deleted

## 2019-01-18 VITALS — BP 120/70 | HR 74 | Ht 63.0 in | Wt 106.0 lb

## 2019-01-18 DIAGNOSIS — I25119 Atherosclerotic heart disease of native coronary artery with unspecified angina pectoris: Secondary | ICD-10-CM | POA: Diagnosis not present

## 2019-01-18 DIAGNOSIS — E785 Hyperlipidemia, unspecified: Secondary | ICD-10-CM

## 2019-01-18 DIAGNOSIS — Z5181 Encounter for therapeutic drug level monitoring: Secondary | ICD-10-CM

## 2019-01-18 NOTE — Progress Notes (Signed)
Virtual Visit via Video Note   This visit type was conducted due to national recommendations for restrictions regarding the COVID-19 Pandemic (e.g. social distancing) in an effort to limit this patient's exposure and mitigate transmission in our community.  Due to her co-morbid illnesses, this patient is at least at moderate risk for complications without adequate follow up.  This format is felt to be most appropriate for this patient at this time.  All issues noted in this document were discussed and addressed.  A limited physical exam was performed with this format.  Please refer to the patient's chart for her consent to telehealth for [facility].   Date:  01/18/2019   ID:  Katrina Walters, DOB 12-Dec-1955, MRN [id number]  Patient Location: Home Provider Location: Office  PCP:  Mackie Pai, PA-C  Cardiologist:  Skeet Latch, MD  Electrophysiologist:  None   Evaluation Performed:  Follow-Up Visit  Chief Complaint:  hyperlipidemia  History of Present Illness:    Katrina Walters is a 63 y.o. female with obstructive CAD (medically managed), shortness of breath, and hyperlipidemia and who presents for follow up.  Katrina Walters was seen in clinic 06/13/16 with exertional shortness of breath.  She had a known coronary calcium score of 376 in 2015 (98% for age and gender).  She was referred for stress testing 11/2014 but was unable to complete it due to cost.  She was able to perform an ETT 06/27/16 that was notable for 6mm ST depressions inferiorly.  She was referred for [facility] 07/17/16 that revealed LVEF 65% with 100% distal RCA, 50% mid RCA, 40% ostial LM, 50% mid LAD, and 70% D1 lesions.  Her symptoms were felt to be attributable to her chronically occluded RCA which had left to right collaterals.  Medical management was recommended with consideration for CABG if her symptoms persist.  She was started on rosuvastatin but did not tolerate due to myalgias. This was switched to atorvastatin.  However this  was stopped due to a rash and swelling. She was then referred to our pharmacist and started on Repatha which she stopped 2/2 pruritis.  She started back on rosuvastatin but was unable to tolerate it again so she had her first shot of Repatha again 04/2018.  So far she seems to be tolerating it well.  In retrospect she thinks the pruritus was due to another medication.  Since [name] has been suffering from severe back pain.  She has cortisone injections but they only last for a couple months.  She is upset that she has to take so many medications.  She cannot stop taking diclofenac due to debilitating pain when she holds it.  However she is upset that she has to take a PPI in addition to this.  She also recently started tramadol.  She is considering trying to find another doctor to see if she has any other alternatives.  Despite this she continues to exercise regularly.  She walks 3-4 times per week and has no exertional chest pain or shortness of breath.  She is able to do her housecleaning on days when her back cooperates.  She has not experienced any lower extremity edema lately.  She denies orthopnea or PND.  Her husband notes that sometimes her breathing seems labored.  This is always at rest and never with exertion.  She had to stop Repatha due to itching.  She was able to get She walks 3-4 times per week and has no chest pain or shortness of  breath.  She has no LE edema, orthopnea or PND.  She is able to do her housecleaning when her back cooperates. Katrina Walters  Her husband notes that her breathing is sometimes labored but never with exertion.  She stopped Repatha due to itching and started bempidoic acid.  She is tolerating this well thus far.    The patient does not have symptoms concerning for COVID-19 infection (fever, chills, cough, or new shortness of breath).    Past Medical History:  Diagnosis Date  . Eating disorder   . Genital warts   . Hyperlipidemia   . 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 BREAST REDUCTION SURGERY  2006  . CARDIAC CATHETERIZATION N/A 07/17/2016   Procedure: Left Heart Cath and Coronary Angiography;  Surgeon: Burnell Blanks, MD;  Location: [facility];  Service: Cardiovascular;  Laterality: N/A;     Current Meds  Medication Sig  . aspirin EC 81 MG tablet Take 81 mg by mouth daily.  . Bempedoic Acid 180 MG TABS Take 180 mg by mouth daily.  . ciclopirox (PENLAC) 8 % solution Apply topically at bedtime. Apply over nail and surrounding skin. Apply daily over previous coat. After seven (7) days, may remove with alcohol and continue cycle.  . colchicine 0.6 MG tablet TAKE 1 TABLET BY MOUTH TWICE A DAY  . denosumab (PROLIA) 60 MG/ML SOSY injection Inject 60 mg into the skin every 6 (six) months.  . diclofenac sodium (VOLTAREN) 1 % GEL Apply topically 4 (four) times daily.  . Estradiol [facility]) Place vaginally.  . Melatonin 3 MG CAPS Take 1 capsule by mouth at bedtime.   . meloxicam (MOBIC) 7.5 MG tablet 1-2 tab po q day  . methocarbamol (ROBAXIN) 500 MG tablet TAKE 2 TABLETS BY MOUTH TWICE A DAY AS NEEDED  . metoprolol succinate (TOPROL-XL) 25 MG 24 hr tablet TAKE 1 TABLET BY MOUTH EVERY DAY  . Misc Natural Products (COMPLETE MENOPAUSE HEALTH PO) Take 1 Dose by mouth daily.   . nitrofurantoin, macrocrystal-monohydrate, (MACROBID) 100 MG capsule Take one pill daily for suppression  . omeprazole (PRILOSEC) 40 MG capsule Take 1 capsule (40 mg total) by mouth daily.  Vladimir Faster Glycol-Propyl Glycol (SYSTANE OP) Place 1 drop into both eyes daily as needed. Dry eyes  . pravastatin (PRAVACHOL) 10 MG tablet Take 1 tablet (10 mg total) by mouth daily.  Katrina Walters PREMARIN vaginal cream Place 0.5 g vaginally 2 (two) times a week.  . sodium polystyrene (KAYEXALATE) 15 GM/60ML suspension 30 ml po x 1 day  . triamcinolone ointment (KENALOG) 0.5 % Apply thin layer TID to affected area x 1wk, then BID x 1wk, then daily x  1wk then stop  . [DISCONTINUED] Evolocumab (REPATHA SURECLICK) 778 MG/ML SOAJ Inject 140 mg into the skin every 14 (fourteen) days.  . [DISCONTINUED] omeprazole (PRILOSEC) 40 MG capsule TAKE 1 CAPSULE(S) BY MOUTH ONCE A DAY FOR 1 DAY     Allergies:   Amoxicillin; Clopidogrel & aspirin; Atorvastatin; and Crestor [rosuvastatin calcium]   Social History   Tobacco Use  . Smoking status: Never Smoker  . Smokeless tobacco: Never Used  Substance Use Topics  . Alcohol use: No    Alcohol/week: 0.0 standard drinks  . Drug use: No     Family Hx: The patient's family history includes Cancer in her paternal aunt; Heart attack in her father and mother; Heart disease in her father and mother; Hypertension in her mother.  ROS:   Please see the history of present illness.     All other systems reviewed and are negative.   Prior CV studies:   The following studies were reviewed today:  ETT 06/27/16:  Blood pressure demonstrated a normal response to exercise.  No T wave inversion was noted during stress.  Horizontal ST segment depression ST segment depression of 2 mm was noted during stress in the II, III and aVF leads, and returning to baseline after less than 1 minute of recovery.  LHC 07/17/16:  The left ventricular systolic function is normal.  LV end diastolic pressure is normal.  The left ventricular ejection fraction is greater than 65% by visual estimate.  There is no mitral valve regurgitation.  Prox RCA lesion, 20 %stenosed.  Mid RCA lesion, 50 %stenosed.  Dist RCA lesion, 100 %stenosed.  Ost LM lesion, 40 %stenosed.  Ost LM to LM lesion, 40 %stenosed.  Mid LAD lesion, 50 %stenosed.  1st Diag lesion, 70 %stenosed.  Ost 1st Mrg to 1st Mrg lesion, 40 %stenosed.  Ost LAD to Prox LAD lesion, 20 %stenosed.  Prox RCA to Mid RCA lesion, 20 %stenosed.  1. Triple vessel CAD  2. Mild to moderate stenosis left main artery. This appears to be eccentric and does not  appear to be flow limiting.  3. Moderate stenosis mid LAD. The small caliber diagonal branch has moderately severe stenosis.  4. Mild to moderate stenosis in the obtuse marginal branch of the Circumflex 5. The RCA is a large dominant vessel with moderate distal stenosis prior to total occlusion of the vessel at the distal bifurcation. The posterolateral artery and the posterior descending artery fills from left to right collaterals.  6. Normal LV systolic function  Recommendations: Her abnormal stress test is likely due to the chronic occlusion of the distal RCA. She has mild to moderate stenosis in the left main artery as well as moderate stenosis in the LAD. I think the best plan for now is medical management of her CAD. If she fails medical management, will have to consider CABG.    Labs/Other Tests and Data Reviewed:    EKG:  No ECG reviewed.  Recent Labs: 09/22/2018: ALT 8; BUN 20; Creatinine, Ser 0.81; Hemoglobin 14.2; Platelets 387.0; Potassium 5.4; Sodium 136   Recent Lipid Panel Lab Results  Component Value Date/Time   CHOL 107 09/22/2018 11:52 AM   CHOL 141 08/04/2018 09:23 AM   TRIG 132.0 09/22/2018 11:52 AM   HDL 30.90 (L) 09/22/2018 11:52 AM   HDL 38 (L) 08/04/2018 09:23 AM   CHOLHDL 3 09/22/2018 11:52 AM   LDLCALC 49 09/22/2018 11:52 AM   LDLCALC 75 08/04/2018 09:23 AM    Wt Readings from Last 3 Encounters:  01/18/19 106 lb (48.1 kg)  09/22/18 107 lb (48.5 kg)  08/19/18 107 lb 12.5 oz (48.9 kg)     Objective:    BP 120/70   Pulse 74   Ht 5\' 3"  (1.6 m)   Wt 106 lb (48.1 kg)   LMP  (LMP Unknown)   BMI 18.78 kg/m  GENERAL: Well-appearing.  No acute distress. HEENT: Pupils equal round.  Oral mucosa unremarkable NECK:  No jugular venous distention, no visible thyromegaly EXT:  No edema, no cyanosis no clubbing SKIN:  No rashes no nodules NEURO:  Speech fluent.  Cranial nerves grossly intact.  Moves all 4 extremities freely PSYCH:  Cognitively intact,  oriented to person place and time   ASSESSMENT & PLAN:    #  3 vessel CAD: # Shortness of breath: # Hyperlipidemia: Ms. Baldini is doing well on medical management of her three-vessel CAD.  She has been mostly limited by her back pain.  Continue aspirin, metoprolol, pravastatin.   She recently started bempidoic acid.  Check lipids/CMP and uric acid in 2 months.   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  # Palpitations: Stable on metoprolol.   COVID-19 Education: The signs and symptoms of COVID-19 were discussed with the patient and how to seek care for testing (follow up with PCP or arrange E-visit).  The importance of social distancing was discussed today.  Time:   Today, I have spent 16 minutes with the patient with telehealth technology discussing the above problems.     Medication Adjustments/Labs and Tests Ordered: Current medicines are reviewed at length with the patient today.  Concerns regarding medicines are outlined above.   Tests Ordered: Orders Placed This Encounter  Procedures  . Lipid panel  . Comprehensive metabolic panel  . Uric acid    Medication Changes: No orders of the defined types were placed in this encounter.   Disposition:  Follow up in 6 month(s)  Signed, Skeet Latch, MD  01/18/2019 12:44 PM    [facility]

## 2019-01-18 NOTE — Patient Instructions (Addendum)
Medication Instructions:  Your physician recommends that you continue on your current medications as directed. Please refer to the Current Medication list given to you today.  If you need a refill on your cardiac medications before your next appointment, please call your pharmacy.   Lab work: FASTING LP/CMET/URIC ACID IN 2 MONTHS   If you have labs (blood work) drawn today and your tests are completely normal, you will receive your results only by: [name] MyChart Message (if you have MyChart) OR . A paper copy in the mail If you have any lab test that is abnormal or we need to change your treatment, we will call you to review the results.  Testing/Procedures: NONE  Follow-Up: At [facility], you and your health needs are our priority.  As part of our continuing mission to provide you with exceptional heart care, we have created designated Provider Care Teams.  These Care Teams include your primary Cardiologist (physician) and Advanced Practice Providers (APPs -  Physician Assistants and Nurse Practitioners) who all work together to provide you with the care you need, when you need it. You will need a follow up appointment in 6 months.  Please call our office 2 months in advance to schedule this appointment.  You may see Skeet Latch, MD or one of the following Advanced Practice Providers on your designated Care Team:   Kerin Ransom, PA-C Roby Lofts, [place] . Sande Rives, PA-C

## 2019-02-15 ENCOUNTER — Telehealth: Payer: Self-pay | Admitting: Cardiovascular Disease

## 2019-02-15 NOTE — Telephone Encounter (Signed)
New Message      Pt c/o medication issue:  1. Name of Medication: Pravastatin   2. How are you currently taking this medication (dosage and times per day)? 10 mg   3. Are you having a reaction (difficulty breathing--STAT)? No   4. What is your medication issue? Pt says she stopped taking Pravastatin because the muscles hurt in her feet really bad so she stopped the medication      1. Name of Medication: nexletol   2. How are you currently taking this medication (dosage and times per day)? 180 mg   3. Are you having a reaction (difficulty breathing--STAT)? No   4. What is your medication issue? Pt says she stopped the Repatha because she thought it was causing itching but she is still having the itching and think it could be from this medication    Please call

## 2019-02-15 NOTE — Telephone Encounter (Signed)
In addition to failing 3 statin drugs, she has also failed Repatha and Praluent, as both caused her to have rash/itching.

## 2019-02-15 NOTE — Telephone Encounter (Signed)
CV[facility] lipid patient. Message routed

## 2019-02-15 NOTE — Telephone Encounter (Signed)
Returned call to patient.  She has been unable to take the pravastatin 10 mg due to muscle pain.  This is the third statin drug she has failed, having previously tried atorvastatin and rosuvastatin.     She also complains about her skin itching and thinks it could be from the [facility] sample.  She describes it as an itch that occurs only for 1-2 hours after bathing.  Explained that if she were having allergy to [facility], she would be itching around the clock, not just after bathing.  Suggested she speak to her PCP about seeing a dermatologist.  Patient agreeable to that.  Will re-submit PA request for Nexletol, as she has now failed 3 statin drugs.

## 2019-02-22 ENCOUNTER — Other Ambulatory Visit: Payer: Self-pay | Admitting: Medical

## 2019-02-23 ENCOUNTER — Telehealth: Payer: Self-pay

## 2019-02-23 NOTE — Telephone Encounter (Signed)
Prior Auth Approved for Nexletol from 02/16/2019 - 02/16/2020

## 2019-02-24 ENCOUNTER — Telehealth: Payer: Self-pay | Admitting: Medical

## 2019-02-24 NOTE — Telephone Encounter (Signed)
I just saw two forms on [employer] that look like duplicates. May are forms dated. Not sure why just got unless these were forms Ivin Booty was evaluating before they came to providers. Will you to you so they can be processed. Looks like they need records.

## 2019-03-03 ENCOUNTER — Telehealth: Payer: Self-pay | Admitting: Cardiovascular Disease

## 2019-03-03 NOTE — Telephone Encounter (Signed)
Please stop taking Nexletol immediately.    She failed Repatha, Praluent, Nexletol, pravastatin, atorvastatin and rosuvastatin.   Please provide samples of pituvastatin (Livalo) 2mg  tablets. Patient should call back to notify reaction to livalo.  (Start Livalo at least 1 week after stopping Nexletol)

## 2019-03-03 NOTE — Telephone Encounter (Signed)
See note below- patient has been having issues with the medication, itchy while in the shower- but now has worsened to all the time and everywhere. Patient states she is not currently on anything else. Unsure of how to proceed.  Thank you!    Rockne Menghini, RPH-CPP  02/15/19 2:51 PM Note   In addition to failing 3 statin drugs, she has also failed Repatha and Praluent, as both caused her to have rash/itching.      Rockne Menghini, RPH-CPP    02/15/19 2:35 PM Note   Returned call to patient.  She has been unable to take the pravastatin 10 mg due to muscle pain.  This is the third statin drug she has failed, having previously tried atorvastatin and rosuvastatin.     She also complains about her skin itching and thinks it could be from the [facility] sample.  She describes it as an itch that occurs only for 1-2 hours after bathing.  Explained that if she were having allergy to [facility], she would be itching around the clock, not just after bathing.  Suggested she speak to her PCP about seeing a dermatologist.  Patient agreeable to that.  Will re-submit PA request for Nexletol, as she has now failed 3 statin drugs.

## 2019-03-03 NOTE — Telephone Encounter (Signed)
Pt c/o medication issue:  1. Name of Medication: Bempedoic Acid 180 MG TABS  2. How are you currently taking this medication (dosage and times per day)? Take 180 mg by mouth daily.  3. Are you having a reaction (difficulty breathing--STAT)? Yes, itchy.   4. What is your medication issue? Patient states medication makes her itchy.  She has stopped taking the medication because it got must worse.  She stated she called a couple of weeks ago about this.  At first it was only in the shower, but then is was at night and all the time not only in the shower.  She is currently cholesterol medication.

## 2019-03-04 NOTE — Telephone Encounter (Signed)
Called patient and advised of message from PharmD.  Patient advised samples placed up front.  Patient verbalized understanding,.

## 2019-03-08 NOTE — Telephone Encounter (Signed)
Opened in error

## 2019-04-06 ENCOUNTER — Telehealth: Payer: Self-pay | Admitting: Cardiovascular Disease

## 2019-04-06 NOTE — Telephone Encounter (Signed)
lmtcb

## 2019-04-06 NOTE — Telephone Encounter (Signed)
Follow up: ° ° ° °Patient returning your call back.  °

## 2019-04-06 NOTE — Telephone Encounter (Signed)
We don't have any other alternative for cholesterol management at this time.  She failed:  Rosuvastatin  Atorvastatin  pituvastatin (Livalo)  Nexletol   Repatha  Ezetimibe (Zetia)  Will recommend follow up with Dr Lysbeth Penner lipid clinic (lipid specialist).

## 2019-04-06 NOTE — Telephone Encounter (Signed)
Spoke with patient and recommended that she can see Dr. Debara Pickett for lipid management. She agrees w/this. She was scheduled for 1st available lipid clinic appointment on 9/3 for a virtual visit.

## 2019-04-06 NOTE — Telephone Encounter (Signed)
New message:    Patient calling stating that she is having some issues concerning some medication. please call patient.

## 2019-04-06 NOTE — Telephone Encounter (Signed)
Spoke with pt who states she started taking Livalo 2 mg samples and it made her feel itchy. She stopped taking it about a week ago because of this. She wants to know of any other options or recommendations.   She states she is no longer taking Nexletol because it caused her to feel itchy as well and that she took Repatha in the past which she loved, but it caused her to feel itchy too. Pt states she heard of a cholesterol med that you take once a year and wants to know more about it but does not know the name. Informed pt that message would be routed to pharmD to advise. Pt agreeable

## 2019-04-06 NOTE — Telephone Encounter (Signed)
I'm happy to see her - but not sure what to offer.  Dr. Lemmie Evens

## 2019-04-19 ENCOUNTER — Telehealth: Payer: Self-pay | Admitting: Medical

## 2019-04-19 NOTE — Telephone Encounter (Signed)
Copied from [place] [phone]. Topic: Quick Communication - Rx Refill/Question >> Apr 19, 2019  3:52 PM Leward Quan A wrote: Medication: denosumab (PROLIA) 60 MG/ML SOSY injection   Has the patient contacted their pharmacy? Yes.   (Agent: If no, request that the patient contact the pharmacy for the refill.) (Agent: If yes, when and what did the pharmacy advise?)  Preferred Pharmacy (with phone number or street name): [place], [place] - [address] [phone] (Phone) [phone] (Fax)    Agent: Please be advised that RX refills may take up to 3 business days. We ask that you follow-up with your pharmacy.

## 2019-04-22 NOTE — Telephone Encounter (Signed)
I have verified patient in [employer] assist portal. Waiting on summary of benefits from insurance.

## 2019-05-01 ENCOUNTER — Other Ambulatory Visit: Payer: Self-pay | Admitting: Medical

## 2019-05-01 ENCOUNTER — Other Ambulatory Visit: Payer: Self-pay | Admitting: Cardiovascular Disease

## 2019-05-12 ENCOUNTER — Telehealth: Payer: Self-pay | Admitting: Internal Medicine

## 2019-05-12 ENCOUNTER — Encounter: Payer: Self-pay | Admitting: Internal Medicine

## 2019-05-12 ENCOUNTER — Telehealth (INDEPENDENT_AMBULATORY_CARE_PROVIDER_SITE_OTHER): Payer: Self-pay | Admitting: Internal Medicine

## 2019-05-12 VITALS — BP 112/70 | Wt 104.0 lb

## 2019-05-12 DIAGNOSIS — L299 Pruritus, unspecified: Secondary | ICD-10-CM

## 2019-05-12 DIAGNOSIS — I25119 Atherosclerotic heart disease of native coronary artery with unspecified angina pectoris: Secondary | ICD-10-CM

## 2019-05-12 DIAGNOSIS — R931 Abnormal findings on diagnostic imaging of heart and coronary circulation: Secondary | ICD-10-CM

## 2019-05-12 DIAGNOSIS — E7849 Other hyperlipidemia: Secondary | ICD-10-CM

## 2019-05-12 DIAGNOSIS — E785 Hyperlipidemia, unspecified: Secondary | ICD-10-CM

## 2019-05-12 MED ORDER — LIVALO 2 MG PO TABS: 1.0000 mg | ORAL_TABLET | Freq: Every day | ORAL | 11 refills | Status: DC

## 2019-05-12 MED ORDER — DIPHENHYDRAMINE HCL 25 MG PO TABS: 25.0000 mg | ORAL_TABLET | Freq: Every day | ORAL | 11 refills | Status: DC

## 2019-05-12 NOTE — Patient Instructions (Signed)
Medication Instructions:  START pitavastatin (livalo) 2mg  daily START benadryl 25mg  every evening If you need a refill on your cardiac medications before your next appointment, please call your pharmacy.   Lab work: FASTING lab work in 3 months to check cholesterol - prior to next appointment If you have labs (blood work) drawn today and your tests are completely normal, you will receive your results only by: [name] MyChart Message (if you have MyChart) OR . A paper copy in the mail If you have any lab test that is abnormal or we need to change your treatment, we will call you to review the results.  Follow-Up: Dr. Debara Pickett recommends that you schedule a follow up visit with him the in the [place] in 3 months. Please have fasting blood work about 1 week prior to this visit and he will review the blood work results with you at your appointment.   Any Other Special Instructions Will Be Listed Below (If Applicable).  You have been referred to Dr. Tiajuana Amass (allergist)

## 2019-05-12 NOTE — Progress Notes (Signed)
Virtual Visit via Video Note   This visit type was conducted due to national recommendations for restrictions regarding the COVID-19 Pandemic (e.g. social distancing) in an effort to limit this patient's exposure and mitigate transmission in our community.  Due to her co-morbid illnesses, this patient is at least at moderate risk for complications without adequate follow up.  This format is felt to be most appropriate for this patient at this time.  All issues noted in this document were discussed and addressed.  A limited physical exam was performed with this format.  Please refer to the patient's chart for her consent to telehealth for [facility].   Evaluation Performed:  Evaluate dyslipidemia  Date:  05/12/2019   ID:  Katrina Walters, DOB May 21, 1956, MRN [id number]  Patient Location:  [place] [address]  Provider location:   [address], [place], [place] [address]  PCP:  Mackie Pai, PA-C  Cardiologist:  Skeet Latch, MD Electrophysiologist:  None   Chief Complaint:  Dyslipidemia, medication intolerance  History of Present Illness:    Katrina Walters is a 63 y.o. female who presents via audio/video conferencing for a telehealth visit today.  Katrina Walters is a pleasant 63 year old female originally from [place], with a strong family history of coronary disease and longstanding dyslipidemia.  In 2015 she underwent calcium scoring at [facility] demonstrating a high coronary calcium score 376 with multivessel coronary calcification at [facility] of age and sex matched controls.  It seems she was asymptomatic at the time.  Subsequently she underwent Bruce treadmill stress testing in 2017 which indicated horizontal ST segment depression and some ST elevation in aVR suggestive of ischemia.  She was subsequently referred for cardiac catheterization which showed normal LVEF 65%.  There was multivessel coronary disease with 20 to 50% proximal to  mid RCA stenosis in 100% distal RCA stenosis, 40% ostial left main stenosis, 50% mid LAD stenosis 70% diagonal stenosis and 40% OM1 stenosis.  Mid LAD was considered to small caliber for intervention.  The RCA was a large dominant vessel prior to total occlusion and left to right collaterals were noted distally.  Medical therapy was recommended.  Since then there were attempts for aggressive lipid management by the pharmacists in our lipid clinic.  Her cholesterol in March 2016 showed total cholesterol 244, LDL 170, non-HDL cholesterol 195, triglycerides 130 and HDL 48.  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  Subsequently, she has been on numerous therapies including pravastatin, atorvastatin, rosuvastatin, Nexletol, Repatha, and Praluent, all of which has caused her pruritus.  She says that it is significant and seems to be somewhat worse with the PCSK9 inhibitors however Repatha was the best at lowering her cholesterol.  Recently she was also tried on Livalo 2 mg daily which also caused her pruritus however not quite as significant.  It is a low to moderate potency statin.  Her last labs 7 months ago showed total cholesterol 107, triglycerides 132, HDL 30 and LDL 49 - this was on Repatha. She was then given Nexletol which also caused pruritis.  The patient does not have symptoms concerning for COVID-19 infection (fever, chills, cough, or new SHORTNESS OF BREATH).    Prior CV studies:   The following studies were reviewed today:  Chart reviewed  PMHx:  Past Medical History:  Diagnosis Date  . Eating disorder   . Genital warts   . Hyperlipidemia   . 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 BREAST REDUCTION SURGERY  2006  . CARDIAC CATHETERIZATION N/A 07/17/2016   Procedure: Left Heart Cath and Coronary Angiography;  Surgeon: Burnell Blanks, MD;  Location: [facility];  Service: Cardiovascular;  Laterality: N/A;     FAMHx:  Family History  Problem Relation Age of Onset  . Heart disease Mother        died at 43 of heart attack  . Hypertension Mother   . Heart attack Mother   . Heart disease Father        had heart attack at age 48  . Heart attack Father   . Cancer Paternal Aunt     SOCHx:   reports that she has never smoked. She has never used smokeless tobacco. She reports that she does not drink alcohol or use drugs.  ALLERGIES:  Allergies  Allergen Reactions  . Amoxicillin     Had fever with flu like symptoms. Has patient had a PCN reaction causing immediate rash, facial/tongue/throat swelling, SOB or lightheadedness with hypotension: no Has patient had a PCN reaction causing severe rash involving mucus membranes or skin necrosis: no Has patient had a PCN reaction that required hospitalization no Has patient had a PCN reaction occurring within the last 10 years: yes If all of the above answers are "NO", then may proceed with Cephalosporin use.  [name] Clopidogrel & Aspirin Itching  . Atorvastatin     Myalgia  . Crestor [Rosuvastatin Calcium]     MYALGIA   . Pravastatin     itching  . Repatha [Evolocumab]     itching    MEDS:  Current Meds  Medication Sig  . aspirin EC 81 MG tablet Take 81 mg by mouth daily.  . colchicine 0.6 MG tablet TAKE 1 TABLET BY MOUTH TWICE A DAY  . diclofenac sodium (VOLTAREN) 1 % GEL Apply topically 4 (four) times daily.  . Melatonin 3 MG CAPS Take 1 capsule by mouth at bedtime.   . methocarbamol (ROBAXIN) 500 MG tablet TAKE 2 TABLETS BY MOUTH TWICE A DAY AS NEEDED  . metoprolol succinate (TOPROL-XL) 25 MG 24 hr tablet TAKE 1 TABLET BY MOUTH EVERY DAY  . Misc Natural Products (COMPLETE MENOPAUSE HEALTH PO) Take 1 Dose by mouth daily.   . nitrofurantoin, macrocrystal-monohydrate, (MACROBID) 100 MG capsule Take one pill daily for suppression  . NON FORMULARY daily. CBD oil - for pain  . VITAMIN E PO Take by mouth daily.     ROS: Pertinent items noted in  HPI and remainder of comprehensive ROS otherwise negative.  Labs/Other Tests and Data Reviewed:    Recent Labs: 09/22/2018: ALT 8; BUN 20; Creatinine, Ser 0.81; Hemoglobin 14.2; Platelets 387.0; Potassium 5.4; Sodium 136   Recent Lipid Panel Lab Results  Component Value Date/Time   CHOL 107 09/22/2018 11:52 AM   CHOL 141 08/04/2018 09:23 AM   TRIG 132.0 09/22/2018 11:52 AM   HDL 30.90 (L) 09/22/2018 11:52 AM   HDL 38 (L) 08/04/2018 09:23 AM   CHOLHDL 3 09/22/2018 11:52 AM   LDLCALC 49 09/22/2018 11:52 AM   LDLCALC 75 08/04/2018 09:23 AM    Wt Readings from Last 3 Encounters:  05/12/19 104 lb (47.2 kg)  01/18/19 106 lb (48.1 kg)  09/22/18 107 lb (48.5 kg)     Exam:    Vital Signs:  BP 112/70   Wt 104 lb (47.2 kg)   LMP  (LMP Unknown)   BMI 18.42  kg/m    General appearance: alert and no distress Lungs: No audible respiratory difficulty Abdomen: Normal weight Extremities: extremities normal, atraumatic, no cyanosis or edema Skin: No visual rash on the arms Neurologic: Mental status: Alert, oriented, thought content appropriate Psych: Pleasant  ASSESSMENT & PLAN:    1. Probable FH 2. 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 Moderate CAC score of 376 (2016) 4. 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  Most recently she was tried on Nexletol and then Livalo.  From a statin standpoint, if her pruritus is related to metabolic byproduct in the liver than the Livalo is least likely to cause side effects and I would recommend we continue with 2 mg daily.  For the time being I would advise her to try over-the-counter diphenhydramine 25 mg nightly.  As this is not anaphylactic or life-threatening, I will refer her to an allergist for  further testing.  Without adequate therapy, she would likely progress to need bypass surgery.  Ultimately if her pruritus is improved and it is tolerable for her to take an antihistamine, we may need to consider going back on Repatha for maximal benefit.  The other complicating issue is that her husband just lost his job and they have lost their health insurance.  She is applied for Medicaid however that will likely take some time.  Unfortunately, despite her advanced coronary disease she is not a candidate for the Orion-4 study of inclisarin, due to the fact that she has already been on PCSK9i and that she has no history of prior MI, stroke or PAD with claudication. Follow-up with me in 3 months with a repeat lipid profile and Lp(a).  Thanks for the referral.  COVID-19 Education: The signs and symptoms of COVID-19 were discussed with the patient and how to seek care for testing (follow up with PCP or arrange E-visit).  The importance of social distancing was discussed today.  Patient Risk:   After full review of this patients clinical status, I feel that they are at least moderate risk at this time.  Time:   Today, I have spent 25 minutes with the patient with telehealth technology discussing dyslipidemia, pruritis, FH, coronary disease.     Medication Adjustments/Labs and Tests Ordered: Current medicines are reviewed at length with the patient today.  Concerns regarding medicines are outlined above.   Tests Ordered: Orders Placed This Encounter  Procedures  . Lipid panel    Medication Changes: Meds ordered this encounter  Medications  . Pitavastatin Calcium (LIVALO) 2 MG TABS    Sig: Take 0.5 tablets (1 mg total) by mouth daily.    Dispense:  30 tablet    Refill:  11  . diphenhydrAMINE (BENADRYL ALLERGY) 25 MG tablet    Sig: Take 1 tablet (25 mg total) by mouth at bedtime.    Dispense:  30 tablet    Refill:  11    Disposition:  in 3 month(s)  Pixie Casino, MD, [facility], [place] Director of the Advanced Lipid Disorders &  Cardiovascular Risk Reduction Clinic Diplomate of the American Board of Clinical Lipidology Attending Cardiologist  Direct Dial: [phone]  Fax: [phone]  Website:  [email or web address]  Pixie Casino, MD  05/12/2019 9:03 AM

## 2019-05-12 NOTE — Telephone Encounter (Signed)
Called patient and gave her the date of 05-19-19 at 9:45 with Dr. Tiajuana Amass.  She will need to bring $300.00 with her for the first visit.  Gave her the date, time, address and phone number.

## 2019-05-13 ENCOUNTER — Telehealth: Payer: Self-pay | Admitting: Internal Medicine

## 2019-05-13 MED ORDER — ROSUVASTATIN CALCIUM 5 MG PO TABS: 5.0000 mg | ORAL_TABLET | ORAL | 3 refills | Status: DC

## 2019-05-13 NOTE — Telephone Encounter (Signed)
Pt c/o medication issue:  1. Name of Medication: Pitavastatin Calcium (LIVALO) 2 MG TABS  2. How are you currently taking this medication (dosage and times per day)? 0.5 tablet once daily  3. Are you having a reaction (difficulty breathing--STAT)? no  4. What is your medication issue? Pt states her medication is too expensive  The patient went to the pharmacy to pick up her new medication ,and the pharmacy wanted $400. She was unable to use a savings card to get a discounted rate on her medication. The pharmacy told her to contact her doctor to try a medication that would be more affordable.   [id number] [place], [place].

## 2019-05-13 NOTE — Telephone Encounter (Signed)
Of course - looks like she had myalgia, but maybe not itching on Crestor - could she try 5 mg crestor every other day?  Dr. Lemmie Evens

## 2019-05-13 NOTE — Telephone Encounter (Signed)
Patient called w/MD recommendation on medication. Rx(s) sent to pharmacy electronically.

## 2019-05-13 NOTE — Telephone Encounter (Signed)
Katrina Walters - I think [facility] saw her

## 2019-05-13 NOTE — Telephone Encounter (Signed)
Patient is self-pay, no insurance coverage at this time.

## 2019-07-08 ENCOUNTER — Other Ambulatory Visit: Payer: Self-pay | Admitting: Cardiovascular Disease

## 2019-08-09 HISTORY — PX: VAGINAL HYSTERECTOMY: SUR661

## 2019-08-09 HISTORY — PX: ABDOMINAL HYSTERECTOMY: SHX81

## 2019-08-09 HISTORY — PX: BLADDER SURGERY: SHX569

## 2019-08-22 ENCOUNTER — Ambulatory Visit: Payer: Self-pay | Admitting: Internal Medicine

## 2019-09-18 ENCOUNTER — Other Ambulatory Visit: Payer: Self-pay | Admitting: Cardiovascular Disease

## 2019-09-28 ENCOUNTER — Telehealth: Payer: Self-pay

## 2019-09-28 ENCOUNTER — Telehealth: Payer: Self-pay | Admitting: Medical

## 2019-09-28 ENCOUNTER — Other Ambulatory Visit: Payer: Self-pay

## 2019-09-28 MED ORDER — COLCHICINE 0.6 MG PO TABS: 0.6000 mg | ORAL_TABLET | Freq: Two times a day (BID) | ORAL | 1 refills | Status: DC

## 2019-09-28 MED ORDER — TRIAMCINOLONE ACETONIDE 0.5 % EX OINT: TOPICAL_OINTMENT | CUTANEOUS | 0 refills | Status: DC

## 2019-09-28 MED ORDER — COLCHICINE 0.6 MG PO TABS: 0.6000 mg | ORAL_TABLET | Freq: Two times a day (BID) | ORAL | 0 refills | Status: DC

## 2019-09-28 MED ORDER — PREMARIN 0.625 MG/GM VA CREA: 0.5000 g | TOPICAL_CREAM | VAGINAL | 0 refills | Status: DC

## 2019-09-28 NOTE — Telephone Encounter (Signed)
Medication: colchicine   Has the patient contacted their pharmacy? Yes Caller Name: Nav with [place] Phone: [phone] ext [phone]  Nav states that the pt contacted [facility] to fill RX for colchicine That was prescribed today by [name]. She states they only provide in qty of 100 doses and requesting updated RX. Please call.

## 2019-09-28 NOTE — Telephone Encounter (Signed)
Called patient and printed Rx. Advised patient of needing follow up since has not been seen in a year 1 month supply of meds printed for Colchine and Kenalog ointment

## 2019-09-28 NOTE — Telephone Encounter (Signed)
Spoke to patient and informed.

## 2019-09-28 NOTE — Telephone Encounter (Signed)
Patient trying to get 100 ct of RX (colchicine) instead of 60ct. The phramacy will only send 100ct not 60 ct. [email or web address] [phone] Patient needs Meds. Asap.

## 2019-09-28 NOTE — Telephone Encounter (Signed)
Rx printed and faxed.  

## 2019-09-28 NOTE — Telephone Encounter (Signed)
Patient called in to see if Dr. Harvie Heck can hand wright a prescription for the patient medication there is 2 medications that the patient is needing.

## 2019-09-28 NOTE — Telephone Encounter (Signed)
Patient called in to let us know that she has the wrong prescription for her cream. She needs Vaginal Cream by the name of Prenarin. She needs a prescription for 30 mg please and the strength of it it is .[place]

## 2019-09-29 ENCOUNTER — Telehealth: Payer: Self-pay

## 2019-09-29 MED ORDER — COLCHICINE 0.6 MG PO TABS: 0.6000 mg | ORAL_TABLET | Freq: Two times a day (BID) | ORAL | 0 refills | Status: DC

## 2019-09-29 NOTE — Telephone Encounter (Signed)
Rx.was printed yesterday will have ES sign today

## 2019-09-29 NOTE — Telephone Encounter (Signed)
Caller Name: Joneen Caraway, husband Phone: [phone] or call Ahniya at [phone]  Pharmacy: [email or web address] - in [place] Ph [phone] Fax [phone]  Joneen Caraway called stating he called 2x yesterday & came into the office 2x yesterday. We also receive a call from the pharmacy. They are very upset this hasn't been submitted correctly.  RX needs corrected to #100. This is the only qty that can be dispensed and if not written for #100 they cannot send out medication to the patient.   [facility]. I called [facility] and got fax # [phone]. This is the only way to submit the RX.

## 2019-09-29 NOTE — Telephone Encounter (Signed)
ROSA FROM [facility] CALLED IN TO GET CONFORMATION ON A PRESCRIPTION  FOR THE PATIENT FOR COLCHICINE THE PRESCRIPTION WAS WRITTEN FOR QTY OF 60 AND THEY ONLY HAVE A QTY OF 100. SHE IS WANTING  TO KNOW IF THEY CAN GET AN APPROVAL TO FILL THE PRESCRIPTION FOR THE QTY OF 100  PLEASE GIVE ROSA A CALL BACK AT [phone] EXT: [phone]

## 2019-09-29 NOTE — Telephone Encounter (Signed)
Rx printed again for patient with 100 tabs only faxing to pharmacy

## 2019-10-04 ENCOUNTER — Ambulatory Visit: Payer: Self-pay | Admitting: Medical

## 2019-10-31 ENCOUNTER — Other Ambulatory Visit: Payer: Self-pay | Admitting: Cardiovascular Disease

## 2019-11-28 NOTE — Telephone Encounter (Signed)
This encounter was created in error - please disregard.

## 2020-01-10 ENCOUNTER — Other Ambulatory Visit: Payer: Self-pay

## 2020-01-10 ENCOUNTER — Ambulatory Visit (INDEPENDENT_AMBULATORY_CARE_PROVIDER_SITE_OTHER): Payer: Medicare Other | Admitting: Medical

## 2020-01-10 ENCOUNTER — Encounter: Payer: Self-pay | Admitting: Medical

## 2020-01-10 ENCOUNTER — Telehealth: Payer: Self-pay | Admitting: Medical

## 2020-01-10 ENCOUNTER — Telehealth: Payer: Self-pay

## 2020-01-10 VITALS — BP 129/68 | HR 73 | Temp 97.1°F | Resp 20 | Ht 63.0 in | Wt 106.6 lb

## 2020-01-10 DIAGNOSIS — R5383 Other fatigue: Secondary | ICD-10-CM

## 2020-01-10 DIAGNOSIS — M810 Age-related osteoporosis without current pathological fracture: Secondary | ICD-10-CM

## 2020-01-10 DIAGNOSIS — R944 Abnormal results of kidney function studies: Secondary | ICD-10-CM | POA: Diagnosis not present

## 2020-01-10 DIAGNOSIS — E785 Hyperlipidemia, unspecified: Secondary | ICD-10-CM | POA: Diagnosis not present

## 2020-01-10 DIAGNOSIS — H9193 Unspecified hearing loss, bilateral: Secondary | ICD-10-CM

## 2020-01-10 DIAGNOSIS — L509 Urticaria, unspecified: Secondary | ICD-10-CM

## 2020-01-10 DIAGNOSIS — R1013 Epigastric pain: Secondary | ICD-10-CM | POA: Diagnosis not present

## 2020-01-10 DIAGNOSIS — K5900 Constipation, unspecified: Secondary | ICD-10-CM

## 2020-01-10 DIAGNOSIS — I25119 Atherosclerotic heart disease of native coronary artery with unspecified angina pectoris: Secondary | ICD-10-CM

## 2020-01-10 LAB — COMPREHENSIVE METABOLIC PANEL
ALT: 13 U/L (ref 0–35)
AST: 16 U/L (ref 0–37)
Albumin: 4.1 g/dL (ref 3.5–5.2)
Alkaline Phosphatase: 74 U/L (ref 39–117)
BUN: 30 mg/dL — ABNORMAL HIGH (ref 6–23)
CO2: 30 mEq/L (ref 19–32)
Calcium: 9.5 mg/dL (ref 8.4–10.5)
Chloride: 100 mEq/L (ref 96–112)
Creatinine, Ser: 0.96 mg/dL (ref 0.40–1.20)
GFR: 58.72 mL/min — ABNORMAL LOW (ref >60.00)
Glucose, Bld: 88 mg/dL (ref 70–99)
Potassium: 5.3 mEq/L — ABNORMAL HIGH (ref 3.5–5.1)
Sodium: 135 mEq/L (ref 135–145)
Total Bilirubin: 0.5 mg/dL (ref 0.2–1.2)
Total Protein: 6.7 g/dL (ref 6.0–8.3)

## 2020-01-10 LAB — LIPID PANEL
Cholesterol: 137 mg/dL (ref 0–200)
HDL: 35.9 mg/dL — ABNORMAL LOW (ref >39.00)
LDL Cholesterol: 77 mg/dL (ref 0–99)
NonHDL: 100.85
Total CHOL/HDL Ratio: 4
Triglycerides: 117 mg/dL (ref 0.0–149.0)
VLDL: 23.4 mg/dL (ref 0.0–40.0)

## 2020-01-10 LAB — VITAMIN B12: Vitamin B-12: 538 pg/mL (ref 211–911)

## 2020-01-10 MED ORDER — COLCHICINE 0.6 MG PO TABS: 0.6000 mg | ORAL_TABLET | Freq: Two times a day (BID) | ORAL | 11 refills | Status: DC

## 2020-01-10 MED ORDER — ALENDRONATE SODIUM 70 MG PO TABS: 70.0000 mg | ORAL_TABLET | ORAL | 11 refills | Status: DC

## 2020-01-10 MED ORDER — OMEPRAZOLE 40 MG PO CPDR: 40.0000 mg | DELAYED_RELEASE_CAPSULE | Freq: Every day | ORAL | 3 refills | Status: DC

## 2020-01-10 NOTE — Telephone Encounter (Signed)
Opened to review 

## 2020-01-10 NOTE — Progress Notes (Signed)
   Subjective:    Patient ID: Katrina Walters, female    DOB: 10/20/1955, 64 y.o.   MRN: [id number]  HPI  Pt in today for follow up.  Pt updates me she had surgery on her bladder in January. Had urogynecologic surgery and reconstructive surgery.   Pt states after showers her skin will itch for about 15 minutes. Pt tried hydroxyzine and did not help. She thinks maybe temp related.   Pt needs refill of colchicine for mediterranian fever hx.  Pt has history of back pain and herniated disk. She is taking diclofenac. Only taking one tablet a day. Previously failed epidural per pt description.  Pt on dexascan has shown osteoporosis. Pt willing to try fosamax 70 mg q week.  Pt has gerd. Some flare since having to take omeprazole.  Pt has hx of high cholesterol and hx of CAD. Pt has tripple vessel disease and now needs to get back in with cardiologist. She has new insurance. She was with [facility] briefly when she lost insurance. Pt on crestor 10 mg. She states no reaction. But is on pt allergy list. However doing fine.          Review of Systems  Constitutional: Negative for chills, fatigue and fever.  HENT: Negative for congestion.   Respiratory: Negative for cough, chest tightness, shortness of breath and wheezing.   Cardiovascular: Negative for chest pain and palpitations.  Gastrointestinal: Negative for abdominal pain, blood in stool, nausea, rectal pain and vomiting.       See  hpi.  Genitourinary: Negative for dysuria, enuresis, flank pain, urgency and vaginal bleeding.  Musculoskeletal: Positive for back pain. Negative for myalgias.  Skin: Negative for rash.  Neurological: Negative for dizziness and light-headedness.  Hematological: Negative for adenopathy. Does not bruise/bleed easily.  Psychiatric/Behavioral: Negative for behavioral problems and dysphoric mood.       Objective:   Physical Exam  General Mental Status- Alert. General Appearance- Not in acute distress.    Skin General: Color- Normal Color. Moisture- Normal Moisture.  Neck Carotid Arteries- Normal color. Moisture- Normal Moisture. No carotid bruits. No JVD.  Chest and Lung Exam Auscultation: Breath Sounds:-Normal.  Cardiovascular Auscultation:Rythm- Regular. Murmurs & Other Heart Sounds:Auscultation of the heart reveals- No Murmurs.  Abdomen Inspection:-Inspeection Normal. Palpation/Percussion:Note:No mass. Palpation and Percussion of the abdomen reveal- Non Tender, Non Distended + BS, no rebound or guarding.    Neurologic Cranial Nerve exam:- CN III-XII intact(No nystagmus), symmetric smile. Strength:- 5/5 equal and symmetric strength both upper and lower extremities.      Assessment & Plan:  You have hx of osteoporosis. Will rx alondrenate. Rx advisement given/discussed. Vit D today.  For gerd, rx omeprazole.  For decreased gfr get cmp.  For decreased hearing audiology referral placed.  For high cholesterol and CAD put in referral to cardiologist, continue crestor and aspirin. Lipid panel today.  For fatigue get tsh, b12 and b1.  For meditarrian fever refilled colchicine.  For back pain continue diclofenac.  Follow up in one month or as needed  Time spent with patient today was 40  minutes which consisted of chart review, discussing diagnoses, work up, referrals  treatment and documentation.

## 2020-01-10 NOTE — Patient Instructions (Addendum)
You have hx of osteoporosis. Will rx alondrenate. Rx advisement given/discussed. Vit D today.  For gerd, rx omeprazole.  For decreased gfr get cmp.  For decreased hearing audiology referral placed.  For high cholesterol and CAD put in referral to cardiologist, continue crestor and aspirin. Lipid panel today.  For fatigue get tsh, b12 and b1.  For meditarrian fever refilled colchicine.  For back pain continue diclofenac.  Also end mentioned constipation. Advised try dulcolax every other day and increase fiber in diet.   Also for itching after shower, I advised try lubriderm daily but decrease temp of water.  Follow up in one month or as needed

## 2020-01-10 NOTE — Telephone Encounter (Signed)
Patient called in to see if PA Mackie Pai could hand write her prescription for  alendronate (FOSAMAX) 70 MG tablet [id number]    Per the patient she would like [name] or the nurse give her a call when the prescription is ready for pick up. Please call the patient back at [phone]

## 2020-01-10 NOTE — Telephone Encounter (Signed)
Script printed and pt notified.

## 2020-01-11 ENCOUNTER — Other Ambulatory Visit: Payer: Self-pay

## 2020-01-11 MED ORDER — COLCHICINE 0.6 MG PO TABS: 0.6000 mg | ORAL_TABLET | Freq: Two times a day (BID) | ORAL | 11 refills | Status: DC

## 2020-01-13 LAB — VITAMIN D 1,25 DIHYDROXY
Vitamin D 1, 25 (OH)2 Total: 59 pg/mL (ref 18–72)
Vitamin D2 1, 25 (OH)2: <8 pg/mL
Vitamin D3 1, 25 (OH)2: 59 pg/mL

## 2020-01-15 LAB — VITAMIN B1: Vitamin B1 (Thiamine): 14 nmol/L (ref 8–30)

## 2020-01-19 ENCOUNTER — Encounter: Payer: Self-pay | Admitting: Internal Medicine

## 2020-01-19 ENCOUNTER — Ambulatory Visit: Payer: Medicare Other | Admitting: Internal Medicine

## 2020-01-19 ENCOUNTER — Other Ambulatory Visit: Payer: Self-pay

## 2020-01-19 VITALS — BP 124/76 | HR 63 | Ht 63.0 in | Wt 109.0 lb

## 2020-01-19 DIAGNOSIS — R931 Abnormal findings on diagnostic imaging of heart and coronary circulation: Secondary | ICD-10-CM

## 2020-01-19 DIAGNOSIS — I25119 Atherosclerotic heart disease of native coronary artery with unspecified angina pectoris: Secondary | ICD-10-CM

## 2020-01-19 DIAGNOSIS — E785 Hyperlipidemia, unspecified: Secondary | ICD-10-CM

## 2020-01-19 NOTE — Progress Notes (Signed)
LIPID CLINIC CONSULT NOTE  Chief Complaint:  Follow-up dyslipidemia  Primary Care Physician: Mackie Pai, PA-C  Primary Cardiologist:  Skeet Latch, MD  HPI:  Katrina Walters is a 64 y.o. female who presents via audio/video conferencing for a telehealth visit today.  Katrina Walters is a pleasant 65 year old female originally from [place], with a strong family history of coronary disease and longstanding dyslipidemia.  In 2015 she underwent calcium scoring at [facility] demonstrating a high coronary calcium score 376 with multivessel coronary calcification at [facility] of age and sex matched controls.  It seems she was asymptomatic at the time.  Subsequently she underwent [facility] treadmill stress testing in 2017 which indicated horizontal ST segment depression and some ST elevation in aVR suggestive of ischemia.  She was subsequently referred for cardiac catheterization which showed normal LVEF 65%.  There was multivessel coronary disease with 20 to 50% proximal to mid RCA stenosis in 100% distal RCA stenosis, 40% ostial left main stenosis, 50% mid LAD stenosis 70% diagonal stenosis and 40% OM1 stenosis.  Mid LAD was considered to small caliber for intervention.  The RCA was a large dominant vessel prior to total occlusion and left to right collaterals were noted distally.  Medical therapy was recommended.  Since then there were attempts for aggressive lipid management by the pharmacists in our lipid clinic.  Her cholesterol in March 2016 showed total cholesterol 244, LDL 170, non-HDL cholesterol 195, triglycerides 130 and HDL 48.  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  Subsequently, she has been on numerous therapies including pravastatin, atorvastatin, rosuvastatin, Nexletol, Repatha, and Praluent, all of which has caused her pruritus.  She says that it is significant and seems to be somewhat worse with the PCSK9 inhibitors however Repatha was the best at lowering her  cholesterol.  Recently she was also tried on Livalo 2 mg daily which also caused her pruritus however not quite as significant.  It is a low to moderate potency statin.  Her last labs 7 months ago showed total cholesterol 107, triglycerides 132, HDL 30 and LDL 49 - this was on Repatha. She was then given Nexletol which also caused pruritis.  01/19/2020  Katrina Walters returns today for follow-up of dyslipidemia.  Her recent lipid numbers are improved with total cholesterol 137, triglycerides 117, HDL 36 and LDL of 77.  Apparently she had lost her insurance through her husband and then was being seen at [facility].  They started her on rosuvastatin 10 mg every other day which she is currently taking.  She still struggling with pruritus however she thinks it is tolerable because they told her it is "absolutely impossible" for her to have itching related to her statin.  I think this is a little bit of an over sell however she seems to be tolerating the medication.  She is also concerned that the metoprolol that she takes could be causing her itching because this was seen as a listed side effect.  They have regained her insurance and she now wishes to follow-up with Dr. Oval Linsey in our group.  She apparently also had stress testing which was negative.   PMHx:  Past Medical History:  Diagnosis Date  . Eating disorder   . Genital warts   . Hyperlipidemia   . 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 BREAST REDUCTION SURGERY  2006  . CARDIAC CATHETERIZATION N/A 07/17/2016   Procedure: Left Heart Cath and Coronary Angiography;  Surgeon: Burnell Blanks, MD;  Location: [facility];  Service: Cardiovascular;  Laterality: N/A;    FAMHx:  Family History  Problem Relation Age of Onset  . Heart disease Mother        died at 70 of heart attack  . Hypertension Mother   . Heart attack Mother   . Heart disease Father        had heart attack at age  84  . Heart attack Father   . Cancer Paternal Aunt     SOCHx:   reports that she has never smoked. She has never used smokeless tobacco. She reports that she does not drink alcohol or use drugs.  ALLERGIES:  Allergies  Allergen Reactions  . Amoxicillin     Had fever with flu like symptoms. Has patient had a PCN reaction causing immediate rash, facial/tongue/throat swelling, SOB or lightheadedness with hypotension: no Has patient had a PCN reaction causing severe rash involving mucus membranes or skin necrosis: no Has patient had a PCN reaction that required hospitalization no Has patient had a PCN reaction occurring within the last 10 years: yes If all of the above answers are "NO", then may proceed with Cephalosporin use.  [name] Clopidogrel & Aspirin Itching  . Atorvastatin     Myalgia  . Crestor [Rosuvastatin Calcium]     MYALGIA   . Pravastatin     itching  . Repatha [Evolocumab]     itching    ROS: Pertinent items noted in HPI and remainder of comprehensive ROS otherwise negative.  HOME MEDS: Current Outpatient Medications on File Prior to Visit  Medication Sig Dispense Refill  . alendronate (FOSAMAX) 70 MG tablet Take 1 tablet (70 mg total) by mouth once a week. Take with a full glass of water on an empty stomach. 4 tablet 11  . aspirin EC 81 MG tablet Take 81 mg by mouth daily.    . colchicine 0.6 MG tablet Take 1 tablet (0.6 mg total) by mouth 2 (two) times daily. 100 tablet 11  . diclofenac sodium (VOLTAREN) 1 % GEL Apply topically 4 (four) times daily.    . diphenhydrAMINE (BENADRYL ALLERGY) 25 MG tablet Take 1 tablet (25 mg total) by mouth at bedtime. 30 tablet 11  . Melatonin 3 MG CAPS Take 1 capsule by mouth at bedtime.     . methocarbamol (ROBAXIN) 500 MG tablet TAKE 2 TABLETS BY MOUTH TWICE A DAY AS NEEDED 60 tablet 0  . metoprolol succinate (TOPROL-XL) 25 MG 24 hr tablet TAKE 1 TABLET BY MOUTH EVERY DAY 90 tablet 1  . Misc Natural Products (COMPLETE MENOPAUSE  HEALTH PO) Take 1 Dose by mouth daily.     . nitrofurantoin, macrocrystal-monohydrate, (MACROBID) 100 MG capsule Take one pill daily for suppression    . NON FORMULARY daily. CBD oil - for pain    . omeprazole (PRILOSEC) 40 MG capsule Take 1 capsule (40 mg total) by mouth daily. 30 capsule 3  . PREMARIN vaginal cream Place [id number] Applicatorfuls vaginally 2 (two) times a week. 30 g 0  . rosuvastatin (CRESTOR) 10 MG tablet Take 10 mg by mouth every other day.    [name] VITAMIN E PO Take by mouth daily.     No current facility-administered medications on file prior to visit.    LABS/IMAGING: No results found for this or any previous visit (from the past 48 hour(s)). No results found.  LIPID PANEL:    Component Value Date/Time   CHOL 137  01/10/2020 1104   CHOL 141 08/04/2018 0923   TRIG 117.0 01/10/2020 1104   HDL 35.90 (L) 01/10/2020 1104   HDL 38 (L) 08/04/2018 0923   CHOLHDL 4 01/10/2020 1104   VLDL 23.4 01/10/2020 1104   LDLCALC 77 01/10/2020 1104   LDLCALC 75 08/04/2018 0923    WEIGHTS: Wt Readings from Last 3 Encounters:  01/19/20 109 lb (49.4 kg)  01/10/20 106 lb 9.6 oz (48.4 kg)  05/12/19 104 lb (47.2 kg)    VITALS: BP 124/76   Pulse 63   Ht 5\' 3"  (1.6 m)   Wt 109 lb (49.4 kg)   LMP  (LMP Unknown)   SpO2 99%   BMI 19.31 kg/m   EXAM: Deferred  EKG: Deferred  ASSESSMENT: 1. Possible FH 2. 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 Moderate CAC score of 376 (2016) 4. 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  Katrina Walters lipid profile has improved now only on 10 mg every other day of rosuvastatin.  She seems to be tolerating this.  She continues to have issues with pruritus and feels that it could be related to her beta-blocker.  She wants to discuss switching it and I will defer that to Dr. Oval Linsey.  She does wish to follow-up with our group.  She apparently had had some  stress testing at [facility] which was negative.  She never saw an allergist or dermatologist.  Either one would be helpful to further work-up her pruritus.  Follow-up with me as needed.  Pixie Casino, MD, [facility], [place] Director of the Advanced Lipid Disorders &  Cardiovascular Risk Reduction Clinic Diplomate of the American Board of Clinical Lipidology Attending Cardiologist  Direct Dial: [phone]  Fax: [phone]  Website:  [email or web address] 01/19/2020, 10:28 AM

## 2020-01-19 NOTE — Patient Instructions (Signed)
Medication Instructions:  Your physician recommends that you continue on your current medications as directed. Please refer to the Current Medication list given to you today.  *If you need a refill on your cardiac medications before your next appointment, please call your pharmacy*   Follow-Up: At [facility], you and your health needs are our priority.  As part of our continuing mission to provide you with exceptional heart care, we have created designated Provider Care Teams.  These Care Teams include your primary Cardiologist (physician) and Advanced Practice Providers (APPs -  Physician Assistants and Nurse Practitioners) who all work together to provide you with the care you need, when you need it.  We recommend signing up for the patient portal called "MyChart".  Sign up information is provided on this After Visit Summary.  MyChart is used to connect with patients for Virtual Visits (Telemedicine).  Patients are able to view lab/test results, encounter notes, upcoming appointments, etc.  Non-urgent messages can be sent to your provider as well.   To learn more about what you can do with MyChart, go to [email or web address].    Your next appointment:   AS NEEDED with Dr. Debara Pickett   Continue routine follow ups with Dr. Oval Linsey

## 2020-02-01 ENCOUNTER — Ambulatory Visit (INDEPENDENT_AMBULATORY_CARE_PROVIDER_SITE_OTHER): Payer: Medicare Other | Admitting: Medical

## 2020-02-01 ENCOUNTER — Other Ambulatory Visit: Payer: Self-pay

## 2020-02-01 VITALS — BP 118/68 | HR 71 | Temp 97.0°F | Resp 18 | Ht 64.0 in | Wt 105.4 lb

## 2020-02-01 DIAGNOSIS — K5900 Constipation, unspecified: Secondary | ICD-10-CM

## 2020-02-01 DIAGNOSIS — E875 Hyperkalemia: Secondary | ICD-10-CM | POA: Diagnosis not present

## 2020-02-01 DIAGNOSIS — K635 Polyp of colon: Secondary | ICD-10-CM | POA: Diagnosis not present

## 2020-02-01 DIAGNOSIS — H6121 Impacted cerumen, right ear: Secondary | ICD-10-CM

## 2020-02-01 DIAGNOSIS — F419 Anxiety disorder, unspecified: Secondary | ICD-10-CM

## 2020-02-01 LAB — COMPREHENSIVE METABOLIC PANEL
ALT: 19 U/L (ref 0–35)
AST: 22 U/L (ref 0–37)
Albumin: 4.3 g/dL (ref 3.5–5.2)
Alkaline Phosphatase: 73 U/L (ref 39–117)
BUN: 20 mg/dL (ref 6–23)
CO2: 31 mEq/L (ref 19–32)
Calcium: 9.6 mg/dL (ref 8.4–10.5)
Chloride: 100 mEq/L (ref 96–112)
Creatinine, Ser: 0.86 mg/dL (ref 0.40–1.20)
GFR: 66.66 mL/min (ref >60.00)
Glucose, Bld: 50 mg/dL — ABNORMAL LOW (ref 70–99)
Potassium: 4.4 mEq/L (ref 3.5–5.1)
Sodium: 137 mEq/L (ref 135–145)
Total Bilirubin: 0.4 mg/dL (ref 0.2–1.2)
Total Protein: 6.9 g/dL (ref 6.0–8.3)

## 2020-02-01 MED ORDER — BUSPIRONE HCL 7.5 MG PO TABS: 7.5000 mg | ORAL_TABLET | Freq: Two times a day (BID) | ORAL | 0 refills | Status: DC

## 2020-02-01 MED ORDER — LINACLOTIDE 72 MCG PO CAPS: 72.0000 ug | ORAL_CAPSULE | Freq: Every day | ORAL | 0 refills | Status: DC

## 2020-02-01 NOTE — Progress Notes (Signed)
Subjective:    Patient ID: Katrina Walters, female    DOB: [birth year], 64 y.o.   MRN: [id number]  HPI  Pt in with rt ear feeling blocked for one week. States can't hear due to wax.  Pt also states she is feeling anxious, insomnia and mild depression. States feeling anxious last couple of weeks.   Pt also states has constipation. Last bm was 3 days ago. States constipation for 4-5 months. Despite healthy diet, adequate fiber, daily  and hydration. Pt tried dulcolax every other day but still has.   Pt states due for colonoscopy. Had last done in 2014. She had one polyp. Told to repeat in 3 years.   Hx of mild high k on lab review. Will repeat today.    Review of Systems  Constitutional: Negative for chills, fatigue and fever.  HENT: Negative for congestion, ear discharge and mouth sores.   Respiratory: Negative for chest tightness, shortness of breath and wheezing.   Cardiovascular: Negative for chest pain and palpitations.  Gastrointestinal: Positive for constipation. Negative for abdominal pain, diarrhea, nausea and vomiting.  Genitourinary: Negative for difficulty urinating and dysuria.  Musculoskeletal: Negative for back pain.  Skin: Negative for rash.  Neurological: Negative for dizziness, speech difficulty, weakness and headaches.  Hematological: Negative for adenopathy. Does not bruise/bleed easily.  Psychiatric/Behavioral: Positive for dysphoric mood and sleep disturbance. Negative for behavioral problems, confusion and suicidal ideas. The patient is nervous/anxious.        Minimal depression.    Past Medical History:  Diagnosis Date  . Eating disorder   . Genital warts   . Hyperlipidemia   . 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 Marital status: Married    Spouse name: Not on file  . Number of children: Not on file  . Years of education: Not on file  . Highest education level: Not on file  Occupational History  . Occupation:  unemployed at the time  Tobacco Use  . Smoking status: Never Smoker  . Smokeless tobacco: Never Used  Substance and Sexual Activity  . Alcohol use: No    Alcohol/week: 0.0 standard drinks  . Drug use: No  . Sexual activity: Not on file  Other Topics Concern  . Not on file  Social History Narrative  . Not on file   Social Determinants of Health   Financial Resource Strain:   . Difficulty of Paying Living Expenses:   Food Insecurity:   . Worried About Charity fundraiser in the Last Year:   . Arboriculturist in the Last Year:   Transportation Needs:   . Film/video editor (Medical):   [name] Lack of Transportation (Non-Medical):   Physical Activity:   . Days of Exercise per Week:   . Minutes of Exercise per Session:   Stress:   . Feeling of Stress :   Social Connections:   . Frequency of Communication with Friends and Family:   . Frequency of Social Gatherings with Friends and Family:   . Attends Religious Services:   . Active Member of Clubs or Organizations:   . Attends Archivist Meetings:   [name] Marital Status:   Intimate Partner Violence:   . Fear of Current or Ex-Partner:   . Emotionally Abused:   [name] Physically Abused:   . Sexually Abused:     Past Surgical History:  Procedure Laterality Date  . BREAST REDUCTION SURGERY  2006  .  CARDIAC CATHETERIZATION N/A 07/17/2016   Procedure: Left Heart Cath and Coronary Angiography;  Surgeon: Burnell Blanks, MD;  Location: [facility];  Service: Cardiovascular;  Laterality: N/A;    Family History  Problem Relation Age of Onset  . Heart disease Mother        died at 74 of heart attack  . Hypertension Mother   . Heart attack Mother   . Heart disease Father        had heart attack at age 54  . Heart attack Father   . Cancer Paternal Aunt     Allergies  Allergen Reactions  . Amoxicillin     Had fever with flu like symptoms. Has patient had a PCN reaction causing immediate rash,  facial/tongue/throat swelling, SOB or lightheadedness with hypotension: no Has patient had a PCN reaction causing severe rash involving mucus membranes or skin necrosis: no Has patient had a PCN reaction that required hospitalization no Has patient had a PCN reaction occurring within the last 10 years: yes If all of the above answers are "NO", then may proceed with Cephalosporin use.  [name] Clopidogrel & Aspirin Itching  . Atorvastatin     Myalgia  . Crestor [Rosuvastatin Calcium]     MYALGIA   . Pravastatin     itching  . Repatha [Evolocumab]     itching    Current Outpatient Medications on File Prior to Visit  Medication Sig Dispense Refill  . alendronate (FOSAMAX) 70 MG tablet Take 1 tablet (70 mg total) by mouth once a week. Take with a full glass of water on an empty stomach. 4 tablet 11  . aspirin EC 81 MG tablet Take 81 mg by mouth daily.    . colchicine 0.6 MG tablet Take 1 tablet (0.6 mg total) by mouth 2 (two) times daily. 100 tablet 11  . diclofenac sodium (VOLTAREN) 1 % GEL Apply topically 4 (four) times daily.    . diphenhydrAMINE (BENADRYL ALLERGY) 25 MG tablet Take 1 tablet (25 mg total) by mouth at bedtime. 30 tablet 11  . Melatonin 3 MG CAPS Take 1 capsule by mouth at bedtime.     . methocarbamol (ROBAXIN) 500 MG tablet TAKE 2 TABLETS BY MOUTH TWICE A DAY AS NEEDED 60 tablet 0  . metoprolol succinate (TOPROL-XL) 25 MG 24 hr tablet TAKE 1 TABLET BY MOUTH EVERY DAY 90 tablet 1  . Misc Natural Products (COMPLETE MENOPAUSE HEALTH PO) Take 1 Dose by mouth daily.     . nitrofurantoin, macrocrystal-monohydrate, (MACROBID) 100 MG capsule Take one pill daily for suppression    . NON FORMULARY daily. CBD oil - for pain    . omeprazole (PRILOSEC) 40 MG capsule Take 1 capsule (40 mg total) by mouth daily. 30 capsule 3  . PREMARIN vaginal cream Place [id number] Applicatorfuls vaginally 2 (two) times a week. 30 g 0  . rosuvastatin (CRESTOR) 10 MG tablet Take 10 mg by mouth every other day.      [name] VITAMIN E PO Take by mouth daily.     No current facility-administered medications on file prior to visit.    BP 118/68 (BP Location: Left Arm, Patient Position: Sitting, Cuff Size: Normal)   Pulse 71   Temp (!) 97 F (36.1 C) (Temporal)   Resp 18   Ht 5\' 4"  (1.626 m)   Wt 105 lb 6.4 oz (47.8 kg)   LMP  (LMP Unknown)   SpO2 100%   BMI 18.09 kg/m  Objective:   Physical Exam  General Mental Status- Alert. General Appearance- Not in acute distress.   HEENT-right ear canal appears small and there is wax present precluding me from visualizing TM.  Left TM has scant minimal wax present.  Skin General: Color- Normal Color. Moisture- Normal Moisture.  Neck Carotid Arteries- Normal color. Moisture- Normal Moisture. No carotid bruits. No JVD.  Chest and Lung Exam Auscultation: Breath Sounds:-Normal.  Cardiovascular Auscultation:Rythm- Regular. Murmurs & Other Heart Sounds:Auscultation of the heart reveals- No Murmurs.  Abdomen Inspection:-Inspeection Normal. Palpation/Percussion:Note:No mass. Palpation and Percussion of the abdomen reveal- Non Tender, Non Distended + BS, no rebound or guarding.  Neurologic Cranial Nerve exam:- CN III-XII intact(No nystagmus), symmetric smile.       Assessment & Plan:  You do report recent anxiety that has been worked up in the past 2 weeks and indicates some anxiety previously.  Recently anxiety affecting sleep and some mild decreased mood.  Will prescribe BuSpar 7.5 mg to take twice daily.  We will see if this helps your anxiety.  This might indirectly allow you to sleep better and your mood might improve.  Consider increasing dose if needed or adding other type medication.  You mentioned using Savella and in the past I use that primarily for fibromyalgia.  Could reconsider going forward if needed.   You have cerumen impaction with small ear canal.  Would recommend that you get Debrox over-the-counter and use 3-4 times daily.   Attempt to soften the wax.  And try to wash out wax in 1 week.  If still unable to tolerate then will need to refer you to ENT.  For elevated potassium will repeat metabolic panel today.  For history of constipation despite Dulcolax, healthy diet, adequate hydration and exercise, decided to prescribe low-dose Linzess.  We will see if this helps.  You do have history of colon polyp.  I placed referral to GI MD.  Please look for copy of that report.  They will want that before they do the colonoscopy.  Follow-up in 1 week or as needed.  Mackie Pai, PA-C   Time spent with patient today was  30 minutes which consisted of chart review, discussing diagnosis, attempted lavage, work up, treatment and documentation.

## 2020-02-01 NOTE — Patient Instructions (Signed)
You do report recent anxiety that has been worked up in the past 2 weeks and indicates some anxiety previously.  Recently anxiety affecting sleep and some mild decreased mood.  Will prescribe BuSpar 7.5 mg to take twice daily.  We will see if this helps your anxiety.  This might indirectly allow you to sleep better and your mood might improve.  Consider increasing dose if needed or adding other type medication.  You mentioned using Savella and in the past I use that primarily for fibromyalgia.  Could reconsider going forward if needed.   You have cerumen impaction with small ear canal.  Would recommend that you get Debrox over-the-counter and use 3-4 times daily.  Attempt to soften the wax.  And try to wash out wax in 1 week.  If still unable to tolerate then will need to refer you to ENT.  For elevated potassium will repeat metabolic panel today.  For history of constipation despite Dulcolax, healthy diet, adequate hydration and exercise, decided to prescribe low-dose Linzess.  We will see if this helps.  You do have history of colon polyp.  I placed referral to GI MD.  Please look for copy of that report.  They will want that before they do the colonoscopy.  Follow-up in 1 week or as needed.

## 2020-02-02 ENCOUNTER — Encounter: Payer: Self-pay | Admitting: Gastroenterology

## 2020-02-02 ENCOUNTER — Telehealth: Payer: Self-pay | Admitting: Gastroenterology

## 2020-02-02 ENCOUNTER — Telehealth: Payer: Self-pay | Admitting: Medical

## 2020-02-02 NOTE — Telephone Encounter (Deleted)
Hey Dr. Loletha Carrow- this patient is being referred for a colonoscopy. Patient had last colonoscopy in 2015. Wants to transfer care to [place]. You are the DOD from 5/26 pm. I am going to send you the records. Please advise on scheduling. Thank you!

## 2020-02-02 NOTE — Telephone Encounter (Signed)
Hey Dr. Bryan Lemma- this patient is being referred for a colonoscopy. Last colonoscopy was in 2015. Patient wants to transfer care to [place]. I have the records and I am going to send them to you for review. Please advise on scheduling. Thank you!

## 2020-02-02 NOTE — Telephone Encounter (Signed)
Medication: linaclotide (LINZESS) 72 MCG capsule  Patient is requesting if there is something cheaper. With insurance it was over  $142.00 please advise. Thanks

## 2020-02-02 NOTE — Telephone Encounter (Signed)
ERROR

## 2020-02-04 NOTE — Telephone Encounter (Signed)
Can try miralax over the counter every other day. Would try this since  less expensive.

## 2020-02-07 NOTE — Telephone Encounter (Signed)
Spoke with pt

## 2020-02-07 NOTE — Telephone Encounter (Signed)
Lvm to return call

## 2020-02-07 NOTE — Telephone Encounter (Signed)
Caller: Durga Call back phone number [phone]  Patient is returning your call

## 2020-02-08 ENCOUNTER — Telehealth: Payer: Self-pay | Admitting: Cardiovascular Disease

## 2020-02-08 NOTE — Telephone Encounter (Signed)
Follow up  ° ° °Pt is returning call  ° ° °Please call back  °

## 2020-02-08 NOTE — Telephone Encounter (Signed)
Left message to call back  

## 2020-02-08 NOTE — Telephone Encounter (Signed)
Spoke with patient and she has been taking Toprol for some time  She has been having itching for years that she thought was coming from other things Now with that and her hair falling out concerned coming from Toprol  Will forward to Dr Oval Linsey for review

## 2020-02-08 NOTE — Telephone Encounter (Signed)
Pt c/o medication issue:  1. Name of Medication: metoprolol succinate (TOPROL-XL) 25 MG 24 hr tablet  2. How are you currently taking this medication (dosage and times per day)?   3. Are you having a reaction (difficulty breathing--STAT)?   4. What is your medication issue? Patient states it is making her itchy and making her hair fall out.  She would like medication changed.

## 2020-02-13 NOTE — Telephone Encounter (Signed)
Records reviewed and notable for the following:  -Colonoscopy (08/17/2014, Dr. Charisse Klinefelter at [facility]): Single small polypoid polyp at splenic flexure removed with snare (path: Unknown), Internal hemorrhoids. -Reviewed records and family history notable for father with colon cancer  -In light of interval since last colonoscopy (6 years) and family history, agree with recommendation for repeat colonoscopy now for ongoing screening/polyp surveillance.  Okay to schedule direct with me for colonoscopy.

## 2020-02-14 ENCOUNTER — Encounter: Payer: Self-pay | Admitting: Gastroenterology

## 2020-02-14 NOTE — Telephone Encounter (Signed)
Patient is scheduled for 7/23 for colonoscopy.

## 2020-02-15 NOTE — Telephone Encounter (Signed)
Let's just try stopping it for a while to see if her itching resolves.  She has had itching for years and other meds have been stopped for this problem.  Before adding anything else I'd like to see if her symptoms improve off metoprolol. F/u with me or APP in a month.

## 2020-02-16 NOTE — Telephone Encounter (Signed)
Advised patient, verbalized understanding Scheduled 1 month follow up with Doreene Burke PA

## 2020-02-29 ENCOUNTER — Other Ambulatory Visit: Payer: Self-pay | Admitting: Medical

## 2020-03-01 ENCOUNTER — Ambulatory Visit (INDEPENDENT_AMBULATORY_CARE_PROVIDER_SITE_OTHER): Payer: Medicare Other | Admitting: Medical

## 2020-03-01 ENCOUNTER — Other Ambulatory Visit: Payer: Self-pay

## 2020-03-01 ENCOUNTER — Ambulatory Visit (HOSPITAL_BASED_OUTPATIENT_CLINIC_OR_DEPARTMENT_OTHER)
Admission: RE | Admit: 2020-03-01 | Discharge: 2020-03-01 | Disposition: A | Discharge status: Home / Self Care | Payer: Medicare Other | Source: Ambulatory Visit | Attending: Medical | Admitting: Medical

## 2020-03-01 VITALS — BP 125/72 | HR 78 | Resp 18 | Ht 64.0 in | Wt 108.6 lb

## 2020-03-01 DIAGNOSIS — K5900 Constipation, unspecified: Secondary | ICD-10-CM

## 2020-03-01 MED ORDER — LUBIPROSTONE 24 MCG PO CAPS: 24.0000 ug | ORAL_CAPSULE | Freq: Two times a day (BID) | ORAL | 0 refills | Status: DC

## 2020-03-01 NOTE — Progress Notes (Signed)
Subjective:    Patient ID: Katrina Walters, female    DOB: May 31, 1956, 64 y.o.   MRN: [id number]  HPI   On last visit.  Pt also stated has constipation.  States constipation for 4-5 months. Despite healthy diet, adequate fiber  and hydration. Pt tried dulcolax every other day but still has.   Pt states due for colonoscopy. Had last done in 2014. She had one polyp. Told to repeat in 3 years.   I had written linzess to treat ibs-c but it was too expensive. So I sent in miralax   Pt has had hysterectomy in the past. Pt surgeries were laparoscopic. No mention of open procedures.   Pt states yesterday used miralax and glycerin suppository to have bowel movement. But had been 5 days before her last bm.    Pt does have appointment with GI on March 30, 2020.   Pt hx bm pattern was 2-3 days between bm. Now can be up to 5-6 days.   Review of Systems  Constitutional: Negative for chills, fatigue and fever.  Respiratory: Negative for cough, chest tightness, shortness of breath and wheezing.   Cardiovascular: Negative for chest pain and palpitations.  Gastrointestinal: Positive for abdominal pain and constipation. Negative for abdominal distention, blood in stool, nausea and vomiting.       See hpi.  Musculoskeletal: Negative for back pain.  Skin: Negative for rash.  Neurological: Negative for dizziness and headaches.  Hematological: Negative for adenopathy.  Psychiatric/Behavioral: Negative for behavioral problems and sleep disturbance. The patient is not nervous/anxious.     Past Medical History:  Diagnosis Date  . Eating disorder   . Genital warts   . Hyperlipidemia   . 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 Marital status: Married    Spouse name: Not on file  . Number of children: Not on file  . Years of education: Not on file  . Highest education level: Not on file  Occupational History  . Occupation: unemployed at the time  Tobacco  Use  . Smoking status: Never Smoker  . Smokeless tobacco: Never Used  Substance and Sexual Activity  . Alcohol use: No    Alcohol/week: 0.0 standard drinks  . Drug use: No  . Sexual activity: Not on file  Other Topics Concern  . Not on file  Social History Narrative  . Not on file   Social Determinants of Health   Financial Resource Strain:   . Difficulty of Paying Living Expenses:   Food Insecurity:   . Worried About Charity fundraiser in the Last Year:   . Arboriculturist in the Last Year:   Transportation Needs:   . Film/video editor (Medical):   [name] Lack of Transportation (Non-Medical):   Physical Activity:   . Days of Exercise per Week:   . Minutes of Exercise per Session:   Stress:   . Feeling of Stress :   Social Connections:   . Frequency of Communication with Friends and Family:   . Frequency of Social Gatherings with Friends and Family:   . Attends Religious Services:   . Active Member of Clubs or Organizations:   . Attends Archivist Meetings:   [name] Marital Status:   Intimate Partner Violence:   . Fear of Current or Ex-Partner:   . Emotionally Abused:   [name] Physically Abused:   . Sexually Abused:     Past Surgical History:  Procedure Laterality Date  . BREAST REDUCTION SURGERY  2006  . CARDIAC CATHETERIZATION N/A 07/17/2016   Procedure: Left Heart Cath and Coronary Angiography;  Surgeon: Burnell Blanks, MD;  Location: [facility];  Service: Cardiovascular;  Laterality: N/A;    Family History  Problem Relation Age of Onset  . Heart disease Mother        died at 73 of heart attack  . Hypertension Mother   . Heart attack Mother   . Heart disease Father        had heart attack at age 78  . Heart attack Father   . Cancer Paternal Aunt     Allergies  Allergen Reactions  . Amoxicillin     Had fever with flu like symptoms. Has patient had a PCN reaction causing immediate rash, facial/tongue/throat swelling, SOB or  lightheadedness with hypotension: no Has patient had a PCN reaction causing severe rash involving mucus membranes or skin necrosis: no Has patient had a PCN reaction that required hospitalization no Has patient had a PCN reaction occurring within the last 10 years: yes If all of the above answers are "NO", then may proceed with Cephalosporin use.  [name] Clopidogrel & Aspirin Itching  . Atorvastatin     Myalgia  . Crestor [Rosuvastatin Calcium]     MYALGIA   . Pravastatin     itching  . Repatha [Evolocumab]     itching    Current Outpatient Medications on File Prior to Visit  Medication Sig Dispense Refill  . alendronate (FOSAMAX) 70 MG tablet Take 1 tablet (70 mg total) by mouth once a week. Take with a full glass of water on an empty stomach. 4 tablet 11  . aspirin EC 81 MG tablet Take 81 mg by mouth daily.    . busPIRone (BUSPAR) 7.5 MG tablet Take 1 tablet (7.5 mg total) by mouth 2 (two) times daily. 60 tablet 0  . colchicine 0.6 MG tablet Take 1 tablet (0.6 mg total) by mouth 2 (two) times daily. 100 tablet 11  . diclofenac sodium (VOLTAREN) 1 % GEL Apply topically 4 (four) times daily.    . diphenhydrAMINE (BENADRYL ALLERGY) 25 MG tablet Take 1 tablet (25 mg total) by mouth at bedtime. 30 tablet 11  . linaclotide (LINZESS) 72 MCG capsule Take 1 capsule (72 mcg total) by mouth daily before breakfast. 30 capsule 0  . Melatonin 3 MG CAPS Take 1 capsule by mouth at bedtime.     . methocarbamol (ROBAXIN) 500 MG tablet TAKE 2 TABLETS BY MOUTH TWICE A DAY AS NEEDED 60 tablet 0  . Misc Natural Products (COMPLETE MENOPAUSE HEALTH PO) Take 1 Dose by mouth daily.     . nitrofurantoin, macrocrystal-monohydrate, (MACROBID) 100 MG capsule Take one pill daily for suppression    . NON FORMULARY daily. CBD oil - for pain    . omeprazole (PRILOSEC) 40 MG capsule Take 1 capsule (40 mg total) by mouth daily. 30 capsule 3  . PREMARIN vaginal cream Place 3.84 Applicatorfuls vaginally 2 (two) times a week.  30 g 0  . rosuvastatin (CRESTOR) 10 MG tablet Take 10 mg by mouth every other day.    [name] VITAMIN E PO Take by mouth daily.     No current facility-administered medications on file prior to visit.    BP 125/72 (BP Location: Left Arm, Patient Position: Sitting, Cuff Size: Normal)   Pulse 78   Resp 18   Ht 5\' 4"  (1.626 m)  Wt 108 lb 9.6 oz (49.3 kg)   LMP  (LMP Unknown)   SpO2 100%   BMI 18.64 kg/m       Objective:   Physical Exam  General Appearance- Not in acute distress.  HEENT Eyes- Scleraeral/Conjuntiva-bilat- Not Yellow. Mouth & Throat- Normal.  Chest and Lung Exam Auscultation: Breath sounds:-Normal. Adventitious sounds:- No Adventitious sounds.  Cardiovascular Auscultation:Rythm - Regular. Heart Sounds -Normal heart sounds.  Abdomen Inspection:-Inspection Normal.  Palpation/Perucssion: Palpation and Percussion of the abdomen reveal- Non Tender, No Rebound tenderness, No rigidity(Guarding) and No Palpable abdominal masses.  Liver:-Normal.  Spleen:- Normal.   Back- no cva tenderness..      Assessment & Plan:  I suspect your chronic constipation is ibs-c. Linzess was too expensive. Miralax did not help her consistently. Today will precribe amitiza. Stay hydrated and get some exercise daily.  Will get xray abdomen today to assess stool burden.  Follow up 3 weeks or as needed   Mackie Pai, PA-C   Time spent with patient today was 25  minutes which consisted of chart review, discussing diagnosis, work up, treatment and documentation.   Mackie Pai, PA-C

## 2020-03-01 NOTE — Patient Instructions (Signed)
I suspect your chronic constipation is ibs-c. Linzess was too expensive. Miralax did not help her consistently. Today will precribe amitiza. Stay hydrated and get some exercise daily.  Will get xray abdomen today to assess stool burden.  Follow up 3 weeks or as needed

## 2020-03-09 ENCOUNTER — Ambulatory Visit (AMBULATORY_SURGERY_CENTER): Payer: Self-pay | Admitting: *Deleted

## 2020-03-09 ENCOUNTER — Other Ambulatory Visit: Payer: Self-pay

## 2020-03-09 VITALS — Ht 64.0 in | Wt 108.0 lb

## 2020-03-09 DIAGNOSIS — Z800 Family history of malignant neoplasm of digestive organs: Secondary | ICD-10-CM

## 2020-03-09 DIAGNOSIS — Z86010 Personal history of colonic polyps: Secondary | ICD-10-CM

## 2020-03-09 MED ORDER — SUTAB 1479-225-188 MG PO TABS: 1.0000 | ORAL_TABLET | ORAL | 0 refills | Status: DC

## 2020-03-09 NOTE — Progress Notes (Signed)
Patient's care partner in room for pre visit; patient denies allergies to egg/soy; patient denies any anesthesia complications; Patient given instructions for prep at pre visit; COVID vaccine completed on 11/2019; patient states she is taking Amitiza daily and has 2 bowel movements per day;

## 2020-03-14 ENCOUNTER — Ambulatory Visit: Payer: Medicare Other | Admitting: *Deleted

## 2020-03-20 ENCOUNTER — Telehealth: Payer: Self-pay | Admitting: Medical

## 2020-03-20 ENCOUNTER — Encounter: Payer: Self-pay | Admitting: Gastroenterology

## 2020-03-20 NOTE — Telephone Encounter (Signed)
Caller: [name]  Call Back # [phone]  lubiprostone (AMITIZA) 24 MCG capsule [[id number]]   Patient states that she woke up and it is upsetting her stomach. Patient states until to use bathroom with medication. Patient would like another medication.      Please Advise

## 2020-03-21 NOTE — Telephone Encounter (Signed)
Pt has ibs-constipation type symptoms. How is she today? Saw note in chart she was having bm twice a day(sounds like was better briefly with one day abd discomfort). She also has referral to gastroenterologist. When is her appointment. Might be better to wait and let gi decide on which med to switch to if necessary

## 2020-03-21 NOTE — Telephone Encounter (Signed)
Patient returned called. Requesting CMA to call back

## 2020-03-21 NOTE — Telephone Encounter (Signed)
Called pt and lvm to return call.  

## 2020-03-21 NOTE — Telephone Encounter (Signed)
Called pt back, lvm to return call

## 2020-03-21 NOTE — Telephone Encounter (Signed)
Patient has a GI appointment on the 23rd , states she is doing somewhat better but cant go to the bathroom without the medication and wants to know what to do. And she had bad side effects such as not being able to eat or drink .

## 2020-03-22 ENCOUNTER — Encounter: Payer: Self-pay | Admitting: Cardiology

## 2020-03-22 NOTE — Telephone Encounter (Signed)
Called pt and unable to lvm 

## 2020-03-22 NOTE — Telephone Encounter (Signed)
Do we have any opening tomorrow. If so offer her appointment tomorrow. Or Monday. I wrote med about 3 weeks ago. Need to discuss details about how she has been doing. How it was working prior to other day. Not sure changing med needed presently or if she needs labs.   If pain severe over weekend or after hours then ED evaluation.

## 2020-03-23 ENCOUNTER — Other Ambulatory Visit: Payer: Self-pay

## 2020-03-23 ENCOUNTER — Encounter: Payer: Self-pay | Admitting: Cardiology

## 2020-03-23 ENCOUNTER — Ambulatory Visit: Payer: Medicare Other | Admitting: Cardiology

## 2020-03-23 ENCOUNTER — Telehealth: Payer: Self-pay

## 2020-03-23 VITALS — BP 114/72 | HR 71 | Ht 63.0 in | Wt 106.8 lb

## 2020-03-23 DIAGNOSIS — E785 Hyperlipidemia, unspecified: Secondary | ICD-10-CM

## 2020-03-23 DIAGNOSIS — R002 Palpitations: Secondary | ICD-10-CM | POA: Diagnosis not present

## 2020-03-23 DIAGNOSIS — I2510 Atherosclerotic heart disease of native coronary artery without angina pectoris: Secondary | ICD-10-CM

## 2020-03-23 NOTE — Telephone Encounter (Signed)
Called and lmomed the pt that dr Oval Linsey would like to restart the repatha, pa sent, will await determination or callback from pt whichever comes first

## 2020-03-23 NOTE — Assessment & Plan Note (Signed)
Issues with statin myalgia-and other side effects.  She stopped Repatha in the past secondary to "itching" but in retrospect she does not think it was the [place] and would like to try this again since she thinks the Crestor has caused hair loss.

## 2020-03-23 NOTE — Progress Notes (Signed)
Cardiology Office Note:    Date:  03/23/2020   ID:  Katrina Walters, DOB Aug 15, 1956, MRN [id number]  PCP:  Mackie Pai, PA-C  Cardiologist:  Skeet Latch, MD  Electrophysiologist:  None   Referring MD: Elise Benne   No chief complaint on file.   History of Present Illness:    Katrina Walters is a 64 y.o. female with a hx of coronary disease documented by catheterization in November 2017.  She has an occluded distal RCA with left-to-right collaterals, 70% diagonal lesion, 50% mid LAD, 40% OM1, and a 40% left main.  The plan has been for medical therapy.  This is been complicated by side effects from medications.  She was intolerant to Pravachol in the past.  Recently she stopped her metoprolol because of "itching".  This is been a common complaint with medications.  At one point she was on Repatha which worked but she felt at the time that the itching was from that.  In retrospect she stopped the Repatha and her itching issues did not change.  Most recently she stopped her Crestor and her metoprolol because of side effects including itching and hair loss.  She tells me since she stopped both of these medications her symptoms have improved.  She asked about going back on Repatha.  From a cardiac standpoint she is done well fortunately.  She walks 30 to 40 minutes a day without chest pain.  Past Medical History:  Diagnosis Date  . Eating disorder   . Genital warts   . Hyperlipidemia   . 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 ABDOMINAL HYSTERECTOMY  08/2019  . BLADDER SURGERY  08/2019  . BREAST REDUCTION SURGERY  2006  . CARDIAC CATHETERIZATION N/A 07/17/2016   Procedure: Left Heart Cath and Coronary Angiography;  Surgeon: Burnell Blanks, MD;  Location: [facility];  Service: Cardiovascular;  Laterality: N/A;  . COLONOSCOPY  2015  . POLYPECTOMY  2015    Current Medications: Current Meds  Medication Sig  .  alendronate (FOSAMAX) 70 MG tablet Take 1 tablet (70 mg total) by mouth once a week. Take with a full glass of water on an empty stomach.  [name] aspirin EC 81 MG tablet Take 81 mg by mouth daily.  . colchicine 0.6 MG tablet Take 1 tablet (0.6 mg total) by mouth 2 (two) times daily.  . diclofenac (VOLTAREN) 75 MG EC tablet Take 75 mg by mouth 2 (two) times daily as needed.  . diclofenac sodium (VOLTAREN) 1 % GEL Apply topically 4 (four) times daily.  . diphenhydrAMINE (BENADRYL ALLERGY) 25 MG tablet Take 1 tablet (25 mg total) by mouth at bedtime.  [name] lubiprostone (AMITIZA) 24 MCG capsule Take 1 capsule (24 mcg total) by mouth 2 (two) times daily with a meal.  . methocarbamol (ROBAXIN) 500 MG tablet TAKE 2 TABLETS BY MOUTH TWICE A DAY AS NEEDED  . Misc Natural Products (COMPLETE MENOPAUSE HEALTH PO) Take 1 Dose by mouth daily.   . nitrofurantoin, macrocrystal-monohydrate, (MACROBID) 100 MG capsule Take one pill daily for suppression  . NON FORMULARY daily. CBD oil - for pain  . omeprazole (PRILOSEC) 40 MG capsule Take 1 capsule (40 mg total) by mouth daily.  [name] PREMARIN vaginal cream Place 2.63 Applicatorfuls vaginally 2 (two) times a week.  . rosuvastatin (CRESTOR) 10 MG tablet Take 10 mg by mouth every other day.  . Sodium Sulfate-Mag Sulfate-KCl (SUTAB) [phone] MG TABS Take 1  kit by mouth as directed.     Allergies:   Amoxicillin, Clopidogrel & aspirin, Atorvastatin, Crestor [rosuvastatin calcium], Crestor [rosuvastatin], Metoprolol, and Pravastatin   Social History   Socioeconomic History  . Marital status: Married    Spouse name: Not on file  . Number of children: Not on file  . Years of education: Not on file  . Highest education level: Not on file  Occupational History  . Occupation: unemployed at the time  Tobacco Use  . Smoking status: Never Smoker  . Smokeless tobacco: Never Used  Substance and Sexual Activity  . Alcohol use: Yes    Alcohol/week: 0.0 standard drinks     Comment: occassionally  . Drug use: No  . Sexual activity: Not on file  Other Topics Concern  . Not on file  Social History Narrative  . Not on file   Social Determinants of Health   Financial Resource Strain:   . Difficulty of Paying Living Expenses:   Food Insecurity:   . Worried About Charity fundraiser in the Last Year:   . Arboriculturist in the Last Year:   Transportation Needs:   . Film/video editor (Medical):   [name] Lack of Transportation (Non-Medical):   Physical Activity:   . Days of Exercise per Week:   . Minutes of Exercise per Session:   Stress:   . Feeling of Stress :   Social Connections:   . Frequency of Communication with Friends and Family:   . Frequency of Social Gatherings with Friends and Family:   . Attends Religious Services:   . Active Member of Clubs or Organizations:   . Attends Archivist Meetings:   [name] Marital Status:      Family History: The patient's family history includes Cancer in her paternal aunt; Heart attack in her father and mother; Heart disease in her father and mother; Hypertension in her mother; Rectal cancer (age of onset: 37) in her father; Stomach cancer in her paternal grandmother. There is no history of Colon cancer, Colon polyps, or Esophageal cancer.  ROS:   Please see the history of present illness.     All other systems reviewed and are negative.  EKGs/Labs/Other Studies Reviewed:    The following studies were reviewed today: Cath 07/17/2016  EKG:  EKG is ordered today.  The ekg ordered today demonstrates NSR- HR 70  Recent Labs: 02/01/2020: ALT 19; BUN 20; Creatinine, Ser 0.86; Potassium 4.4; Sodium 137  Recent Lipid Panel    Component Value Date/Time   CHOL 137 01/10/2020 1104   CHOL 141 08/04/2018 0923   TRIG 117.0 01/10/2020 1104   HDL 35.90 (L) 01/10/2020 1104   HDL 38 (L) 08/04/2018 0923   CHOLHDL 4 01/10/2020 1104   VLDL 23.4 01/10/2020 1104   LDLCALC 77 01/10/2020 1104   LDLCALC 75  08/04/2018 0923    Physical Exam:    VS:  BP 114/72   Pulse 71   Ht 5' 3" (1.6 m)   Wt 106 lb 12.8 oz (48.4 kg)   LMP  (LMP Unknown)   SpO2 99%   BMI 18.92 kg/m     Wt Readings from Last 3 Encounters:  03/23/20 106 lb 12.8 oz (48.4 kg)  03/09/20 108 lb (49 kg)  03/01/20 108 lb 9.6 oz (49.3 kg)     GEN:  Thin female, well developed in no acute distress HEENT: Normal NECK: No JVD; No carotid bruits CARDIAC: RRR, no murmurs, rubs, gallops  RESPIRATORY:  Clear to auscultation without rales, wheezing or rhonchi  ABDOMEN: Soft, non-tender, non-distended MUSCULOSKELETAL:  No edema; No deformity  SKIN: Warm and dry NEUROLOGIC:  Alert and oriented x 3 PSYCHIATRIC:  Normal affect   ASSESSMENT:    CAD (coronary artery disease) Cath Nov 2017- medical Rx Occluded dRCA with L-R collaterals 70% Dx1 50% mLAD 40% OM1-40% LM  Dyslipidemia, goal LDL below 70 Issues with statin myalgia-and other side effects.  She stopped Repatha in the past secondary to "itching" but in retrospect she does not think it was the [place] and would like to try this again since she thinks the Crestor has caused hair loss.   Palpitations Beta blocker stopped June 2021 secondary to side effects-"itching"  PLAN:    Pharmacy referral to resume Repatha. She now has insurance and does not feel the "itching" previously noted was from the [place].  For now leave off beta blocker one change at a time.  F/U Dr Oval Linsey in 3 months.     Medication Adjustments/Labs and Tests Ordered: Current medicines are reviewed at length with the patient today.  Concerns regarding medicines are outlined above.  Orders Placed This Encounter  Procedures  . EKG 12-Lead   No orders of the defined types were placed in this encounter.   Patient Instructions  Medication Instructions:  Your physician recommends that you continue on your current medications as directed. Please refer to the Current Medication list given to you  today.  *If you need a refill on your cardiac medications before your next appointment, please call your pharmacy*    Follow-Up: At [facility], you and your health needs are our priority.  As part of our continuing mission to provide you with exceptional heart care, we have created designated Provider Care Teams.  These Care Teams include your primary Cardiologist (physician) and Advanced Practice Providers (APPs -  Physician Assistants and Nurse Practitioners) who all work together to provide you with the care you need, when you need it.  We recommend signing up for the patient portal called "MyChart".  Sign up information is provided on this After Visit Summary.  MyChart is used to connect with patients for Virtual Visits (Telemedicine).  Patients are able to view lab/test results, encounter notes, upcoming appointments, etc.  Non-urgent messages can be sent to your provider as well.   To learn more about what you can do with MyChart, go to [email or web address].    Your next appointment:   3 month(s)  The format for your next appointment:   In Person  Provider:   Skeet Latch, MD   Other Instructions Our Pharmacist will contact you about Repatha.     Angelena Form, PA-C  03/23/2020 10:49 AM    [place]

## 2020-03-23 NOTE — Telephone Encounter (Signed)
-----   Message from Harrington Challenger, [place] sent at 03/23/2020  1:59 PM EDT ----- Regarding: FW: Repatha Please call patient and re-start Repatha per Dr Oval Linsey  Thanks ----- Message ----- From: Erlene Quan, PA-C Sent: 03/23/2020  12:19 PM EDT To: Roxanne Mins Rodriguez-Guzman, RPH-CPP Subject: RE: Repatha                                    Yes I saw her today.  She tells me that she now does not believe the side effects she had previously were from [place] -(and she lost insurance coverage during that time)- and she would like to try it again. She says she now has insurance.  Kerin Ransom PA-C 03/23/2020 12:20 PM   ----- Message ----- From: Harrington Challenger, RPH-CPP Sent: 03/23/2020  12:06 PM EDT To: Erlene Quan, PA-C, Therisa Doyne Subject: RE: Repatha                                    This patient sees Dr Debara Pickett for lipid management. Please see notes.below.  "Subsequently, she has been on numerous therapies including pravastatin, atorvastatin, rosuvastatin, Nexletol, Repatha, and Praluent, all of which has caused her pruritus"  [place]quel ----- Message ----- From: Therisa Doyne Sent: 03/23/2020  10:47 AM EDT To: [facility] Antico[facility] Subject: Repatha                                        Hey! Lurena Joiner saw this pt today in clinic. Will you call patient to discuss Repatha please? Im assuming new start, but I think pt has questions.  Thanks, Lovena Le, CMA

## 2020-03-23 NOTE — Assessment & Plan Note (Signed)
Cath Nov 2017- medical Rx Occluded dRCA with L-R collaterals 70% Dx1 50% mLAD 40% OM1-40% LM

## 2020-03-23 NOTE — Patient Instructions (Signed)
Medication Instructions:  Your physician recommends that you continue on your current medications as directed. Please refer to the Current Medication list given to you today.  *If you need a refill on your cardiac medications before your next appointment, please call your pharmacy*    Follow-Up: At [facility], you and your health needs are our priority.  As part of our continuing mission to provide you with exceptional heart care, we have created designated Provider Care Teams.  These Care Teams include your primary Cardiologist (physician) and Advanced Practice Providers (APPs -  Physician Assistants and Nurse Practitioners) who all work together to provide you with the care you need, when you need it.  We recommend signing up for the patient portal called "MyChart".  Sign up information is provided on this After Visit Summary.  MyChart is used to connect with patients for Virtual Visits (Telemedicine).  Patients are able to view lab/test results, encounter notes, upcoming appointments, etc.  Non-urgent messages can be sent to your provider as well.   To learn more about what you can do with MyChart, go to [email or web address].    Your next appointment:   3 month(s)  The format for your next appointment:   In Person  Provider:   Skeet Latch, MD   Other Instructions Our Pharmacist will contact you about Repatha.

## 2020-03-23 NOTE — Assessment & Plan Note (Signed)
Beta blocker stopped June 2021 secondary to side effects-"itching"

## 2020-03-24 ENCOUNTER — Other Ambulatory Visit: Payer: Self-pay | Admitting: Medical

## 2020-03-26 MED ORDER — REPATHA SURECLICK 140 MG/ML ~~LOC~~ SOAJ: 140.0000 mg | SUBCUTANEOUS | 11 refills | Status: DC

## 2020-03-26 NOTE — Telephone Encounter (Signed)
Called the pt to discuss the approval for repatha, rx sent, pt instructed to call back with any issues, lab orders placed, instructed pt to come when they are 4 shots in

## 2020-03-26 NOTE — Addendum Note (Signed)
Addended by: Allean Found on: 03/26/2020 12:56 PM   Modules accepted: Orders

## 2020-03-30 ENCOUNTER — Other Ambulatory Visit: Payer: Self-pay

## 2020-03-30 ENCOUNTER — Encounter: Payer: Self-pay | Admitting: Gastroenterology

## 2020-03-30 ENCOUNTER — Ambulatory Visit (AMBULATORY_SURGERY_CENTER): Payer: Medicare Other | Admitting: Gastroenterology

## 2020-03-30 ENCOUNTER — Telehealth: Payer: Self-pay

## 2020-03-30 VITALS — BP 117/72 | HR 72 | Temp 96.2°F | Resp 11 | Ht 64.0 in | Wt 108.0 lb

## 2020-03-30 DIAGNOSIS — Q438 Other specified congenital malformations of intestine: Secondary | ICD-10-CM

## 2020-03-30 DIAGNOSIS — Z800 Family history of malignant neoplasm of digestive organs: Secondary | ICD-10-CM | POA: Diagnosis not present

## 2020-03-30 DIAGNOSIS — D128 Benign neoplasm of rectum: Secondary | ICD-10-CM | POA: Diagnosis not present

## 2020-03-30 DIAGNOSIS — K640 First degree hemorrhoids: Secondary | ICD-10-CM

## 2020-03-30 DIAGNOSIS — Z86010 Personal history of colonic polyps: Secondary | ICD-10-CM

## 2020-03-30 DIAGNOSIS — D129 Benign neoplasm of anus and anal canal: Secondary | ICD-10-CM

## 2020-03-30 MED ORDER — SODIUM CHLORIDE 0.9 % IV SOLN: 500.0000 mL | Freq: Once | INTRAVENOUS | Status: DC

## 2020-03-30 MED ORDER — LINACLOTIDE 145 MCG PO CAPS: 145.0000 ug | ORAL_CAPSULE | Freq: Every day | ORAL | 3 refills | Status: DC

## 2020-03-30 NOTE — Op Note (Signed)
[place] Patient Name: Katrina Walters Procedure Date: 03/30/2020 11:08 AM MRN: [id number] Endoscopist: Gerrit Heck , MD Age: 64 Referring MD:  Date of Birth: 1955/10/06 Gender: Female Account #: [id number] Procedure:                Colonoscopy Indications:              Surveillance: Personal history of colonic polyps                            (unknown histology) on last colonoscopy more than 5                            years ago (2015 at outside facility with polyp of                            unknown size, location, histology)                           Additionally, she reports a longer standing history                            of constipation, exacerbated after pelvic surgery                            in 08/2019. Recently trialed course of Amitiza,                            which was efficacious, but stopped due to upset                            stomach.                           Family history notable for father with CRC in his                            28's. Medicines:                Monitored Anesthesia Care Procedure:                Pre-Anesthesia Assessment:                           - Prior to the procedure, a History and Physical                            was performed, and patient medications and                            allergies were reviewed. The patient's tolerance of                            previous anesthesia was also reviewed. The risks                            and benefits  of the procedure and the sedation                            options and risks were discussed with the patient.                            All questions were answered, and informed consent                            was obtained. Prior Anticoagulants: The patient has                            taken no previous anticoagulant or antiplatelet                            agents. ASA Grade Assessment: III - A patient with                            severe systemic  disease. After reviewing the risks                            and benefits, the patient was deemed in                            satisfactory condition to undergo the procedure.                           After obtaining informed consent, the colonoscope                            was passed under direct vision. Throughout the                            procedure, the patient's blood pressure, pulse, and                            oxygen saturations were monitored continuously. The                            Colonoscope was introduced through the anus and                            advanced to the the cecum, identified by                            appendiceal orifice and ileocecal valve. The                            colonoscopy was performed without difficulty. The                            patient tolerated the procedure well. The quality  of the bowel preparation was adequate. The                            ileocecal valve, appendiceal orifice, and rectum                            were photographed. Scope In: 11:34:51 AM Scope Out: 11:54:38 AM Scope Withdrawal Time: 0 hours 14 minutes 24 seconds  Total Procedure Duration: 0 hours 19 minutes 47 seconds  Findings:                 The perianal and digital rectal examinations were                            normal.                           Two sessile polyps were found in the rectum. The                            polyps were 3 to 4 mm in size. These polyps were                            removed with a cold snare. Resection and retrieval                            were complete. Estimated blood loss was minimal.                           The sigmoid colon was moderately tortuous, but                            traversable.                           Non-bleeding internal hemorrhoids were found during                            retroflexion. The hemorrhoids were small.                           There was a  medium-sized lipoma, in the ascending                            colon.                           The exam was otherwise normal throughout the                            examined colon. Complications:            No immediate complications. Estimated Blood Loss:     Estimated blood loss was minimal. Impression:               - Two 3 to 4 mm polyps in the rectum, removed with  a cold snare. Resected and retrieved.                           - Tortuous sigmoid colon. Based on symptomatology                            and prior surgical history, query adhesive disease                            contributing to her constipation.                           - Non-bleeding internal hemorrhoids.                           - Medium-sized lipoma in the ascending colon. Recommendation:           - Patient has a contact number available for                            emergencies. The signs and symptoms of potential                            delayed complications were discussed with the                            patient. Return to normal activities tomorrow.                            Written discharge instructions were provided to the                            patient.                           - Resume previous diet.                           - Continue present medications.                           - Await pathology results.                           - Repeat colonoscopy for surveillance based on                            pathology results.                           - Return to GI office at appointment to be                            scheduled. Gerrit Heck, MD 03/30/2020 12:01:44 PM

## 2020-03-30 NOTE — Progress Notes (Signed)
Pt's states no medical or surgical changes since previsit or office visit.  VS CW  

## 2020-03-30 NOTE — Progress Notes (Signed)
To PACU, VSS. Report to Rn.tb 

## 2020-03-30 NOTE — Telephone Encounter (Signed)
Left message on patients voicemail regarding follow up appointment for 05/17/20 at 10:40.  Instructions to pick up Linzess 145 mcg at Pharmacy.

## 2020-03-30 NOTE — Progress Notes (Signed)
Called to room to assist during endoscopic procedure.  Patient ID and intended procedure confirmed with present staff. Received instructions for my participation in the procedure from the performing physician.  

## 2020-03-30 NOTE — Patient Instructions (Signed)
Information on polyps and hemorrhoids given to you today.  Resume previous diet and medications.  YOU HAD AN ENDOSCOPIC PROCEDURE TODAY AT [facility]:   Refer to the procedure report that was given to you for any specific questions about what was found during the examination.  If the procedure report does not answer your questions, please call your gastroenterologist to clarify.  If you requested that your care partner not be given the details of your procedure findings, then the procedure report has been included in a sealed envelope for you to review at your convenience later.  YOU SHOULD EXPECT: Some feelings of bloating in the abdomen. Passage of more gas than usual.  Walking can help get rid of the air that was put into your GI tract during the procedure and reduce the bloating. If you had a lower endoscopy (such as a colonoscopy or flexible sigmoidoscopy) you may notice spotting of blood in your stool or on the toilet paper. If you underwent a bowel prep for your procedure, you may not have a normal bowel movement for a few days.  Please Note:  You might notice some irritation and congestion in your nose or some drainage.  This is from the oxygen used during your procedure.  There is no need for concern and it should clear up in a day or so.  SYMPTOMS TO REPORT IMMEDIATELY:   Following lower endoscopy (colonoscopy or flexible sigmoidoscopy):  Excessive amounts of blood in the stool  Significant tenderness or worsening of abdominal pains  Swelling of the abdomen that is new, acute  Fever of 100F or higher    For urgent or emergent issues, a gastroenterologist can be reached at any hour by calling [phone]. Do not use MyChart messaging for urgent concerns.    DIET:  We do recommend a small meal at first, but then you may proceed to your regular diet.  Drink plenty of fluids but you should avoid alcoholic beverages for 24 hours.  ACTIVITY:  You should plan to  take it easy for the rest of today and you should NOT DRIVE or use heavy machinery until tomorrow (because of the sedation medicines used during the test).    FOLLOW UP: Our staff will call the number listed on your records 48-72 hours following your procedure to check on you and address any questions or concerns that you may have regarding the information given to you following your procedure. If we do not reach you, we will leave a message.  We will attempt to reach you two times.  During this call, we will ask if you have developed any symptoms of COVID 19. If you develop any symptoms (ie: fever, flu-like symptoms, shortness of breath, cough etc.) before then, please call [phone].  If you test positive for Covid 19 in the 2 weeks post procedure, please call and report this information to [place].    If any biopsies were taken you will be contacted by phone or by letter within the next 1-3 weeks.  Please call us at [phone] if you have not heard about the biopsies in 3 weeks.    SIGNATURES/CONFIDENTIALITY: You and/or your care partner have signed paperwork which will be entered into your electronic medical record.  These signatures attest to the fact that that the information above on your After Visit Summary has been reviewed and is understood.  Full responsibility of the confidentiality of this discharge information lies with you and/or your care-partner.

## 2020-04-03 ENCOUNTER — Telehealth: Payer: Self-pay | Admitting: *Deleted

## 2020-04-03 ENCOUNTER — Other Ambulatory Visit: Payer: Self-pay | Admitting: Medical

## 2020-04-03 NOTE — Telephone Encounter (Signed)
  Follow up Call-  Call back number 03/30/2020  Post procedure Call Back phone  # [phone]  Permission to leave phone message Yes  Some recent data might be hidden     Patient questions:  Do you have a fever, pain , or abdominal swelling? No. Pain Score  0 *  Have you tolerated food without any problems? Yes.    Have you been able to return to your normal activities? Yes.    Do you have any questions about your discharge instructions: Diet   No. Medications  No. Follow up visit  No.  Do you have questions or concerns about your Care? No.  Actions: * If pain score is 4 or above: No action needed, pain <4  1. Have you developed a fever since your procedure? NO  2.   Have you had an respiratory symptoms (SOB or cough) since your procedure? NO  3.   Have you tested positive for COVID 19 since your procedure NO  4.   Have you had any family members/close contacts diagnosed with the COVID 19 since your procedure?  NO   If yes to any of these questions please route to Joylene John, RN and Erenest Rasher, RN

## 2020-04-12 ENCOUNTER — Encounter: Payer: Self-pay | Admitting: Gastroenterology

## 2020-04-13 ENCOUNTER — Telehealth: Payer: Self-pay | Admitting: Gastroenterology

## 2020-04-13 NOTE — Telephone Encounter (Signed)
Patient called states she is highly concerned can not go to the bathroom and the medicine Linzess is not helping she is not feeling well.

## 2020-04-13 NOTE — Telephone Encounter (Signed)
Spoke to patient who states that since starting Linzess 145 mcg she is having a bowel movement about every 3 days. She states that she does not drink much water through out the day. Patient Feels she should be having more bowel movements while on the Linzess. It was recommended for her to increase her water intake and eat mor fruits and vegetables during the day. She was informed that [place] takes about a week to really start working. Patient has a follow up appointment in September.Patient states she will increase her water intake and eat more fruits and vegetables. She denies abdominal pain or bloating.

## 2020-04-23 ENCOUNTER — Other Ambulatory Visit: Payer: Self-pay | Admitting: Internal Medicine

## 2020-05-17 ENCOUNTER — Ambulatory Visit: Payer: Medicare Other | Admitting: Gastroenterology

## 2020-05-17 ENCOUNTER — Encounter: Payer: Self-pay | Admitting: Gastroenterology

## 2020-05-17 VITALS — BP 110/62 | HR 87 | Ht 63.0 in | Wt 107.1 lb

## 2020-05-17 DIAGNOSIS — K649 Unspecified hemorrhoids: Secondary | ICD-10-CM

## 2020-05-17 DIAGNOSIS — K5900 Constipation, unspecified: Secondary | ICD-10-CM | POA: Diagnosis not present

## 2020-05-17 DIAGNOSIS — Z86010 Personal history of colonic polyps: Secondary | ICD-10-CM | POA: Diagnosis not present

## 2020-05-17 MED ORDER — LINACLOTIDE 290 MCG PO CAPS: 290.0000 ug | ORAL_CAPSULE | Freq: Every day | ORAL | 5 refills | Status: DC

## 2020-05-17 NOTE — Patient Instructions (Addendum)
If you are age 63 or older, your body mass index should be between 23-30. Your Body mass index is 18.98 kg/m. If this is out of the aforementioned range listed, please consider follow up with your Primary Care Provider.  If you are age 29 or younger, your body mass index should be between 19-25. Your Body mass index is 18.98 kg/m. If this is out of the aformentioned range listed, please consider follow up with your Primary Care Provider.   We have sent the following medications to your pharmacy for you to pick up at your convenience: Linzess 290 mcg daily.  Miralax as needed.  Follow up in 3-4 months. Please call the office for an appointment as the schedule is not available at this time.  It was a pleasure to see you today!  Vito Cirigliano, D.O.

## 2020-05-17 NOTE — Progress Notes (Signed)
P  Chief Complaint:    Symptomatic hemorrhoids, hematochezia, constipation  GI History: 64 year old female with a history of CAD with cardiac catheterization in 07/2016, HLD, hysterectomy, follows in the GI clinic for chronic constipation.  Previously trialed Dulcolax qod and increased hydration without much response, MiraLAX.  Abdominal x-ray 02/2020 with abundant stool burden.  Then trialed Amitiza in 02/2020 with good efficacy but stopped due to upset stomach.  Started on Linzess in 03/2020 with good clinical response.  Family history notable for father with colon cancer in his 13s.  Endoscopic history: -Colonoscopy (08/17/2014, Dr. Charisse Klinefelter at [facility]): Single small polypoid polyp at splenic flexure removed with snare (path: Unknown), Internal hemorrhoids. -Colonoscopy (03/2020, Dr. Bryan Lemma): 2 rectal polyps (1 tubular adenoma), tortuous sigmoid colon, lipoma in the ascending colon, internal hemorrhoids.  Repeat in 7 years  HPI:     Patient is a 64 y.o. female presenting to the Gastroenterology Clinic for follow-up.  Initially seen by me at time of colonoscopy for ongoing polyp surveillance in 03/2020.  At that time, she also reported a history of chronic constipation, previously treated by GI in [place].  Recent GI history as outlined above.  At that time, we started her on Linzess.  Today, she states constipation has improved with Linzess. Has BM 3 times/week. Still occasional straining to have BM, but much improved. Drinking 32 oz water/day, eating plenty of high fiber foods and fresh fruits/vegetables.   Hemorrhoids with intermittent sxs for scant BRBPR, responsive to OTC Preparation H.  Constipation really only started after hysterectomy in 2020. Prior baseline was BM Q3d and no straining to have BM.    Review of systems:     No chest pain, no SOB, no fevers, no urinary sx   Past Medical History:  Diagnosis Date  . Eating disorder   . Genital warts   .  Hyperlipidemia   . 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 alendronate (FOSAMAX) 70 MG tablet Take 1 tablet (70 mg total) by mouth once a week. Take with a full glass of water on an empty stomach. 4 tablet 11  . aspirin EC 81 MG tablet Take 81 mg by mouth daily.    . colchicine 0.6 MG tablet Take 1 tablet (0.6 mg total) by mouth 2 (two) times daily. 100 tablet 11  . diclofenac (VOLTAREN) 75 MG EC tablet Take 75 mg by mouth 2 (two) times daily as needed.    . diclofenac sodium (VOLTAREN) 1 % GEL Apply topically 4 (four) times daily.    . diphenhydrAMINE (BENADRYL ALLERGY) 25 MG tablet Take 1 tablet (25 mg total) by mouth at bedtime. 30 tablet 11  . Evolocumab (REPATHA SURECLICK) 254 MG/ML SOAJ Inject 140 mg into the skin every 14 (fourteen) days. 2 pen 11  . linaclotide (LINZESS) 145 MCG CAPS capsule Take 1 capsule (145 mcg total) by mouth daily before breakfast. 90 capsule 3  . lubiprostone (AMITIZA) 24 MCG capsule Take 1 capsule (24 mcg total) by mouth 2 (two) times daily with a meal. 60 capsule 1  . methocarbamol (ROBAXIN) 500 MG tablet TAKE 2 TABLETS BY MOUTH TWICE A DAY AS NEEDED 60 tablet 0  . Misc Natural Products (COMPLETE MENOPAUSE HEALTH PO) Take 1 Dose by mouth daily.     . nitrofurantoin, macrocrystal-monohydrate, (MACROBID) 100 MG capsule Take  one pill daily for suppression    . NON FORMULARY daily. CBD oil - for pain    . omeprazole (PRILOSEC) 40 MG capsule TAKE 1 CAPSULE BY MOUTH EVERY DAY 90 capsule 1  . PREMARIN vaginal cream Place 3.30 Applicatorfuls vaginally 2 (two) times a week. 30 g 0  . rosuvastatin (CRESTOR) 10 MG tablet Take 10 mg by mouth every other day.     . rosuvastatin (CRESTOR) 5 MG tablet TAKE 1 TABLET BY MOUTH EVERY OTHER DAY 45 tablet 9   No current facility-administered  medications for this visit.    Physical Exam:     BP 110/62   Pulse 87   Ht 5\' 3"  (1.6 m)   Wt 107 lb 2 oz (48.6 kg)   LMP  (LMP Unknown)   BMI 18.98 kg/m   GENERAL:  Pleasant female in NAD PSYCH: : Cooperative, normal affect Musculoskeletal:  Normal muscle tone, normal strength NEURO: Alert and oriented x 3, no focal neurologic deficits   IMPRESSION and PLAN:    1) Constipation    - Increase Linzess to 290 mcg/day - Miralax prn - Increase water to 64 oz/day -Maintain active lifestyle and regular exercise -Continue high-fiber diet and add fiber supplement  2) Internal hemorrhoids -Conservative management with Preparation H and treatment of underlying constipation as above -If able to manage constipation well hemorrhoid still symptomatic, discussed plan for hemorrhoid band ligation  3) History of tubular adenomas -Repeat colonoscopy in 2028 for ongoing polyp surveillance  I spent 25 minutes of time, including independent review of results as outlined above, communicating results with the patient directly, face-to-face time with the patient, coordinating care, ordering studies and medications as appropriate, and documentation.      Lavena Bullion ,DO, FACG 05/17/2020, 10:53 AM

## 2020-05-21 ENCOUNTER — Other Ambulatory Visit: Payer: Self-pay | Admitting: Internal Medicine

## 2020-05-31 ENCOUNTER — Telehealth: Payer: Self-pay | Admitting: Cardiovascular Disease

## 2020-05-31 MED ORDER — DIPHENHYDRAMINE HCL 25 MG PO TABS: 25.0000 mg | ORAL_TABLET | Freq: Every evening | ORAL | 3 refills | Status: DC

## 2020-05-31 NOTE — Telephone Encounter (Signed)
This is Dr. Frederika's pt.  °

## 2020-05-31 NOTE — Telephone Encounter (Signed)
Please advise if ok to refill non cardiac medication. Pt requesting Diphenhydramine 20 mg tablet qd.

## 2020-05-31 NOTE — Telephone Encounter (Signed)
*  STAT* If patient is at the pharmacy, call can be transferred to refill team.   1. Which medications need to be refilled? (please list name of each medication and dose if known) DIPHENHYDRAMINE 20 mg  2. Which pharmacy/location (including street and city if local pharmacy) is medication to be sent to? [facility]   [place]   3. Do they need a 30 day or 90 day supply? 6 months to a year.

## 2020-05-31 NOTE — Telephone Encounter (Signed)
Per chart review, patient had been prescribed benadryl 25mg  05/2019 Refilled per previous note

## 2020-05-31 NOTE — Telephone Encounter (Signed)
Spoke with patient and she stated Dr Debara Pickett Rx'd secondary to itching from cholesterol medications Will forward to [place] with Dr Debara Pickett

## 2020-06-28 ENCOUNTER — Other Ambulatory Visit: Payer: Self-pay

## 2020-06-28 ENCOUNTER — Encounter: Payer: Self-pay | Admitting: Cardiovascular Disease

## 2020-06-28 ENCOUNTER — Ambulatory Visit: Payer: Medicare Other | Admitting: Cardiovascular Disease

## 2020-06-28 VITALS — BP 110/70 | HR 82 | Ht 63.0 in | Wt 107.0 lb

## 2020-06-28 DIAGNOSIS — R0602 Shortness of breath: Secondary | ICD-10-CM | POA: Diagnosis not present

## 2020-06-28 DIAGNOSIS — E785 Hyperlipidemia, unspecified: Secondary | ICD-10-CM

## 2020-06-28 DIAGNOSIS — I25118 Atherosclerotic heart disease of native coronary artery with other forms of angina pectoris: Secondary | ICD-10-CM

## 2020-06-28 LAB — COMPREHENSIVE METABOLIC PANEL
ALT: 9 IU/L (ref 0–32)
AST: 18 IU/L (ref 0–40)
Albumin/Globulin Ratio: 1.5 (ref 1.2–2.2)
Albumin: 4.4 g/dL (ref 3.8–4.8)
Alkaline Phosphatase: 93 IU/L (ref 44–121)
BUN/Creatinine Ratio: 18 (ref 12–28)
BUN: 15 mg/dL (ref 8–27)
Bilirubin Total: 0.4 mg/dL (ref 0.0–1.2)
CO2: 27 mmol/L (ref 20–29)
Calcium: 9.8 mg/dL (ref 8.7–10.3)
Chloride: 100 mmol/L (ref 96–106)
Creatinine, Ser: 0.82 mg/dL (ref 0.57–1.00)
GFR calc Af Amer: 88 mL/min/{1.73_m2} (ref >59)
GFR calc non Af Amer: 76 mL/min/{1.73_m2} (ref >59)
Globulin, Total: 2.9 g/dL (ref 1.5–4.5)
Glucose: 84 mg/dL (ref 65–99)
Potassium: 5.1 mmol/L (ref 3.5–5.2)
Sodium: 137 mmol/L (ref 134–144)
Total Protein: 7.3 g/dL (ref 6.0–8.5)

## 2020-06-28 LAB — LIPID PANEL
Chol/HDL Ratio: 3.2 ratio (ref 0.0–4.4)
Cholesterol, Total: 121 mg/dL (ref 100–199)
HDL: 38 mg/dL — ABNORMAL LOW (ref >39)
LDL Chol Calc (NIH): 62 mg/dL (ref 0–99)
Triglycerides: 114 mg/dL (ref 0–149)
VLDL Cholesterol Cal: 21 mg/dL (ref 5–40)

## 2020-06-28 MED ORDER — DIPHENHYDRAMINE HCL 25 MG PO TABS
25.0000 mg | ORAL_TABLET | Freq: Every evening | ORAL | 3 refills | Status: DC
Start: 2020-06-28 — End: 2021-02-01

## 2020-06-28 MED ORDER — NEBIVOLOL HCL 2.5 MG PO TABS
2.5000 mg | ORAL_TABLET | Freq: Every day | ORAL | 3 refills | Status: DC
Start: 2020-06-28 — End: 2020-10-09

## 2020-06-28 NOTE — Progress Notes (Signed)
Cardiology Office Note   Date:  06/28/2020   ID:  Katrina Walters, DOB 1956/08/26, MRN [id number]  PCP:  Mackie Pai, PA-C  Cardiologist:  Skeet Latch, MD  Electrophysiologist:  None   Evaluation Performed:  Follow-Up Visit  Chief Complaint:  hyperlipidemia  History of Present Illness:    Katrina Walters is a 64 y.o. female with obstructive CAD (medically managed), shortness of breath, and hyperlipidemia who presents for follow up.  [name] was seen in clinic 06/13/16 with exertional shortness of breath.  She had a known coronary calcium score of 376 in 2015 (98% for age and gender).  She was referred for stress testing 11/2014 but was unable to complete it due to cost.  She was able to perform an ETT 06/27/16 that was notable for 63mm ST depressions inferiorly.  She was referred for [facility] 07/17/16 that revealed LVEF 65% with 100% distal RCA, 50% mid RCA, 40% ostial LM, 50% mid LAD, and 70% D1 lesions.  Her symptoms were felt to be attributable to her chronically occluded RCA which had left to right collaterals.  Medical management was recommended with consideration for CABG if her symptoms persist.  She was started on rosuvastatin but did not tolerate due to myalgias. This was switched to atorvastatin.  However this was stopped due to a rash and swelling. She was then referred to our pharmacist and started on Repatha which she stopped 2/2 pruritis.  She started back on rosuvastatin but was unable to tolerate it again so she had her first shot of Repatha again 04/2018.  So far she seems to be tolerating it well.  In retrospect she thinks the pruritus was due to another medication.  [name] has struggled with severe back pain that required injections and diclofenac.  She continues to walk for exercise regularly.  Metoprolol was discontinued due to itching and hair loss.  Since then she notes that she is more short of breath when walking.  She also notes that her resting heart rate is higher.  She  has struggled with statins.  She also stopped taking Repatha due to itching.  She saw cardiology at [facility] 06/2019 due to a change in her insurance.  At that time she was not symptomatic but her physical activity was reportedly limited.  She was referred for an echo 07/2019 that revealed LVEF 60 to 65% and was otherwise unremarkable.  She also had a Lexiscan that was negative for ischemia.  She continues to augment exercise despite the fact that she has shortness of breath since her beta-blocker was discontinued.  She has no edema, orthopnea, or PND.  She had uterovaginal prolapse and underwent surgical repair on 08/2019.  She is frustrated that she struggled with constipation since her surgery.  She is sometimes unable to go to the bathroom for a week at a time.   Past Medical History:  Diagnosis Date  . Eating disorder   . Genital warts   . Hyperlipidemia   . 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 ABDOMINAL HYSTERECTOMY  08/2019  . BLADDER SURGERY  08/2019  . BREAST REDUCTION SURGERY  2006  . CARDIAC CATHETERIZATION N/A 07/17/2016   Procedure: Left Heart Cath and Coronary Angiography;  Surgeon: Burnell Blanks, MD;  Location: [place] [facility];  Service: Cardiovascular;  Laterality: N/A;  . COLONOSCOPY  2015  . POLYPECTOMY  2015     Current Meds  Medication Sig  . alendronate (  FOSAMAX) 70 MG tablet Take 1 tablet (70 mg total) by mouth once a week. Take with a full glass of water on an empty stomach.  [name] aspirin EC 81 MG tablet Take 81 mg by mouth daily.  . colchicine 0.6 MG tablet Take 1 tablet (0.6 mg total) by mouth 2 (two) times daily.  . diclofenac (VOLTAREN) 75 MG EC tablet Take 75 mg by mouth 2 (two) times daily as needed.  . diclofenac sodium (VOLTAREN) 1 % GEL Apply topically 4 (four) times daily.  . diphenhydrAMINE (BENADRYL ALLERGY) 25 MG tablet Take 1 tablet (25 mg total) by mouth at bedtime.  [name] linaclotide (LINZESS)  290 MCG CAPS capsule Take 1 capsule (290 mcg total) by mouth daily before breakfast.  . lubiprostone (AMITIZA) 24 MCG capsule Take 1 capsule (24 mcg total) by mouth 2 (two) times daily with a meal.  . methocarbamol (ROBAXIN) 500 MG tablet TAKE 2 TABLETS BY MOUTH TWICE A DAY AS NEEDED  . Misc Natural Products (COMPLETE MENOPAUSE HEALTH PO) Take 1 Dose by mouth daily.   . NON FORMULARY daily. CBD oil - for pain  . omeprazole (PRILOSEC) 40 MG capsule TAKE 1 CAPSULE BY MOUTH EVERY DAY  . PREMARIN vaginal cream Place 3.29 Applicatorfuls vaginally 2 (two) times a week.  . rosuvastatin (CRESTOR) 10 MG tablet Take 10 mg by mouth every other day.   . [DISCONTINUED] diphenhydrAMINE (BENADRYL ALLERGY) 25 MG tablet Take 1 tablet (25 mg total) by mouth at bedtime.     Allergies:   Amoxicillin, Clopidogrel & aspirin, Atorvastatin, Crestor [rosuvastatin], Metoprolol, and Pravastatin   Social History   Tobacco Use  . Smoking status: Never Smoker  . Smokeless tobacco: Never Used  Vaping Use  . Vaping Use: Never used  Substance Use Topics  . Alcohol use: Not Currently    Alcohol/week: 0.0 standard drinks    Comment: occassionally  . Drug use: No     Family Hx: The patient's family history includes Cancer in her paternal aunt; Heart attack in her father and mother; Heart disease in her father and mother; Hypertension in her mother; Rectal cancer (age of onset: 38) in her father; Stomach cancer in her paternal grandmother. There is no history of Colon cancer, Colon polyps, or Esophageal cancer.  ROS:   Please see the history of present illness.     All other systems reviewed and are negative.   Prior CV studies:   The following studies were reviewed today:  ETT 06/27/16:  Blood pressure demonstrated a normal response to exercise.  No T wave inversion was noted during stress.  Horizontal ST segment depression ST segment depression of 2 mm was noted during stress in the II, III and aVF leads,  and returning to baseline after less than 1 minute of recovery.  LHC 07/17/16:  The left ventricular systolic function is normal.  LV end diastolic pressure is normal.  The left ventricular ejection fraction is greater than 65% by visual estimate.  There is no mitral valve regurgitation.  Prox RCA lesion, 20 %stenosed.  Mid RCA lesion, 50 %stenosed.  Dist RCA lesion, 100 %stenosed.  Ost LM lesion, 40 %stenosed.  Ost LM to LM lesion, 40 %stenosed.  Mid LAD lesion, 50 %stenosed.  1st Diag lesion, 70 %stenosed.  Ost 1st Mrg to 1st Mrg lesion, 40 %stenosed.  Ost LAD to Prox LAD lesion, 20 %stenosed.  Prox RCA to Mid RCA lesion, 20 %stenosed.  1. Triple vessel CAD  2. Mild to moderate  stenosis left main artery. This appears to be eccentric and does not appear to be flow limiting.  3. Moderate stenosis mid LAD. The small caliber diagonal branch has moderately severe stenosis.  4. Mild to moderate stenosis in the obtuse marginal branch of the Circumflex 5. The RCA is a large dominant vessel with moderate distal stenosis prior to total occlusion of the vessel at the distal bifurcation. The posterolateral artery and the posterior descending artery fills from left to right collaterals.  6. Normal LV systolic function  Recommendations: Her abnormal stress test is likely due to the chronic occlusion of the distal RCA. She has mild to moderate stenosis in the left main artery as well as moderate stenosis in the LAD. I think the best plan for now is medical management of her CAD. If she fails medical management, will have to consider CABG.    Labs/Other Tests and Data Reviewed:    EKG:  No ECG reviewed.  Recent Labs: 06/28/2020: ALT 9; BUN 15; Creatinine, Ser 0.82; Potassium 5.1; Sodium 137   Recent Lipid Panel Lab Results  Component Value Date/Time   CHOL 121 06/28/2020 11:50 AM   TRIG 114 06/28/2020 11:50 AM   HDL 38 (L) 06/28/2020 11:50 AM   CHOLHDL 3.2 06/28/2020 11:50  AM   CHOLHDL 4 01/10/2020 11:04 AM   LDLCALC 62 06/28/2020 11:50 AM    Wt Readings from Last 3 Encounters:  06/28/20 107 lb (48.5 kg)  05/17/20 107 lb 2 oz (48.6 kg)  03/30/20 108 lb (49 kg)     Objective:    VS:  BP 110/70   Pulse 82   Ht 5\' 3"  (1.6 m)   Wt 107 lb (48.5 kg)   LMP  (LMP Unknown)   SpO2 99%   BMI 18.95 kg/m  , BMI Body mass index is 18.95 kg/m. GENERAL:  Well appearing HEENT: Pupils equal round and reactive, fundi not visualized, oral mucosa unremarkable NECK:  No jugular venous distention, waveform within normal limits, carotid upstroke brisk and symmetric, no bruits LUNGS:  Clear to auscultation bilaterally HEART:  RRR.  PMI not displaced or sustained,S1 and S2 within normal limits, no S3, no S4, no clicks, no rubs, no murmurs ABD:  Flat, positive bowel sounds normal in frequency in pitch, no bruits, no rebound, no guarding, no midline pulsatile mass, no hepatomegaly, no splenomegaly EXT:  2 plus pulses throughout, no edema, no cyanosis no clubbing SKIN:  No rashes no nodules NEURO:  Cranial nerves II through XII grossly intact, motor grossly intact throughout PSYCH:  Cognitively intact, oriented to person place and time   ASSESSMENT & PLAN:    # 3 vessel CAD: # Shortness of breath: # Hyperlipidemia: Ms. Crayton was doing well on medical management of her three-vessel CAD.    Metoprolol was discontinued due to hair loss.  Since then she has had more exertional dyspnea.  Her blood pressure is quite a little.  We will try doing nebivolol 2.5 mg daily.  She is unable to take carvedilol due to a drug interaction with colchicine.  Continue aspirin, rosuvastatin, and propafenone.  She gets itching with Repatha but is able to tolerate it as long as she uses Benadryl.  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  # Palpitations: Start nebivolol as above.   Medication Adjustments/Labs and  Tests Ordered: Current medicines are reviewed at length with the patient today.  Concerns regarding medicines are outlined  above.   Tests Ordered: Orders Placed This Encounter  Procedures  . Lipid panel  . Comprehensive metabolic panel    Medication Changes: Meds ordered this encounter  Medications  . nebivolol (BYSTOLIC) 2.5 MG tablet    Sig: Take 1 tablet (2.5 mg total) by mouth daily.    Dispense:  90 tablet    Refill:  3  . diphenhydrAMINE (BENADRYL ALLERGY) 25 MG tablet    Sig: Take 1 tablet (25 mg total) by mouth at bedtime.    Dispense:  90 tablet    Refill:  3    Disposition:  Follow up in 3 month(s)  Signed, Skeet Latch, MD  06/28/2020 6:15 PM    [facility]

## 2020-06-28 NOTE — Patient Instructions (Addendum)
.  Medication Instructions:  BYSTOLIC 2.5  MG DAILY   *If you need a refill on your cardiac medications before your next appointment, please call your pharmacy*  Lab Work: LP/CMET TODAY   Testing/Procedures: NONE  Follow-Up: At [facility], you and your health needs are our priority.  As part of our continuing mission to provide you with exceptional heart care, we have created designated Provider Care Teams.  These Care Teams include your primary Cardiologist (physician) and Advanced Practice Providers (APPs -  Physician Assistants and Nurse Practitioners) who all work together to provide you with the care you need, when you need it.  We recommend signing up for the patient portal called "MyChart".  Sign up information is provided on this After Visit Summary.  MyChart is used to connect with patients for Virtual Visits (Telemedicine).  Patients are able to view lab/test results, encounter notes, upcoming appointments, etc.  Non-urgent messages can be sent to your provider as well.   To learn more about what you can do with MyChart, go to [email or web address].    Your next appointment:   3 month(s)  The format for your next appointment:   In Person  Provider:   You may see Skeet Latch, MD or one of the following Advanced Practice Providers on your designated Care Team:    Kerin Ransom, PA-C  [place], [place]  Coletta Memos, [place]

## 2020-09-27 ENCOUNTER — Ambulatory Visit: Payer: Medicare Other | Admitting: General Practice

## 2020-10-02 ENCOUNTER — Other Ambulatory Visit: Payer: Self-pay

## 2020-10-03 ENCOUNTER — Ambulatory Visit (HOSPITAL_BASED_OUTPATIENT_CLINIC_OR_DEPARTMENT_OTHER)
Admission: RE | Admit: 2020-10-03 | Discharge: 2020-10-03 | Disposition: A | Discharge status: Home / Self Care | Payer: Medicare Other | Source: Ambulatory Visit

## 2020-10-03 ENCOUNTER — Ambulatory Visit (HOSPITAL_BASED_OUTPATIENT_CLINIC_OR_DEPARTMENT_OTHER)
Admission: RE | Admit: 2020-10-03 | Discharge: 2020-10-03 | Disposition: A | Discharge status: Home / Self Care | Payer: Medicare Other | Source: Ambulatory Visit | Attending: Medical | Admitting: Medical

## 2020-10-03 ENCOUNTER — Ambulatory Visit (INDEPENDENT_AMBULATORY_CARE_PROVIDER_SITE_OTHER): Payer: Medicare Other | Admitting: Medical

## 2020-10-03 VITALS — BP 126/64 | HR 83 | Temp 98.6°F | Resp 16 | Ht 63.0 in | Wt 111.6 lb

## 2020-10-03 DIAGNOSIS — M5442 Lumbago with sciatica, left side: Secondary | ICD-10-CM

## 2020-10-03 DIAGNOSIS — R300 Dysuria: Secondary | ICD-10-CM

## 2020-10-03 DIAGNOSIS — M47816 Spondylosis without myelopathy or radiculopathy, lumbar region: Secondary | ICD-10-CM | POA: Diagnosis not present

## 2020-10-03 DIAGNOSIS — I25119 Atherosclerotic heart disease of native coronary artery with unspecified angina pectoris: Secondary | ICD-10-CM | POA: Diagnosis not present

## 2020-10-03 DIAGNOSIS — E785 Hyperlipidemia, unspecified: Secondary | ICD-10-CM

## 2020-10-03 DIAGNOSIS — Z780 Asymptomatic menopausal state: Secondary | ICD-10-CM | POA: Insufficient documentation

## 2020-10-03 DIAGNOSIS — A239 Brucellosis, unspecified: Secondary | ICD-10-CM | POA: Diagnosis not present

## 2020-10-03 DIAGNOSIS — Z13820 Encounter for screening for osteoporosis: Secondary | ICD-10-CM | POA: Diagnosis not present

## 2020-10-03 DIAGNOSIS — M810 Age-related osteoporosis without current pathological fracture: Secondary | ICD-10-CM | POA: Insufficient documentation

## 2020-10-03 DIAGNOSIS — Z7185 Encounter for immunization safety counseling: Secondary | ICD-10-CM

## 2020-10-03 DIAGNOSIS — G8929 Other chronic pain: Secondary | ICD-10-CM

## 2020-10-03 DIAGNOSIS — K219 Gastro-esophageal reflux disease without esophagitis: Secondary | ICD-10-CM

## 2020-10-03 DIAGNOSIS — M4316 Spondylolisthesis, lumbar region: Secondary | ICD-10-CM | POA: Diagnosis not present

## 2020-10-03 LAB — POC URINALSYSI DIPSTICK (AUTOMATED)
Blood, UA: NEGATIVE
Glucose, UA: NEGATIVE
Ketones, UA: NEGATIVE
Leukocytes, UA: NEGATIVE
Nitrite, UA: NEGATIVE
Protein, UA: NEGATIVE
Spec Grav, UA: 1.015 (ref 1.010–1.025)
Urobilinogen, UA: 0.2 E.U./dL
pH, UA: 5.5 (ref 5.0–8.0)

## 2020-10-03 IMAGING — DX DG LUMBAR SPINE 2-3V
3 series · 3 of 3 positions shown · non-contrast
Comparison: No recent prior.

CLINICAL DATA: Herniated disc. Back pain. Radiation of pain at the
left lower extremity.

EXAM:
LUMBAR SPINE - 2-3 VIEW

[l-spine ap]
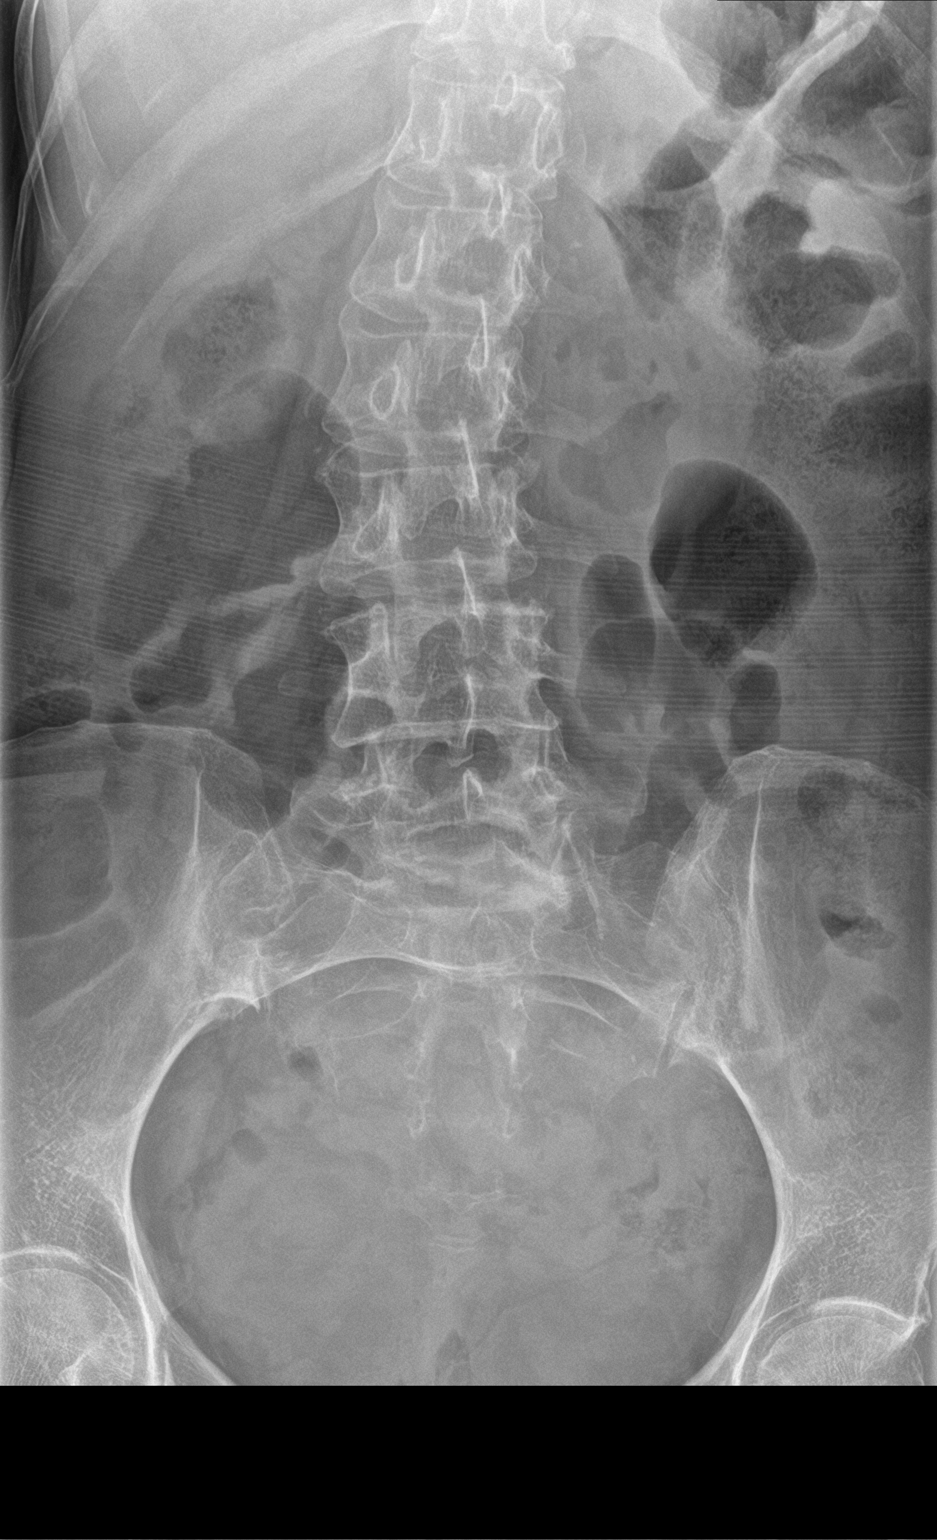

[l-spine lat]
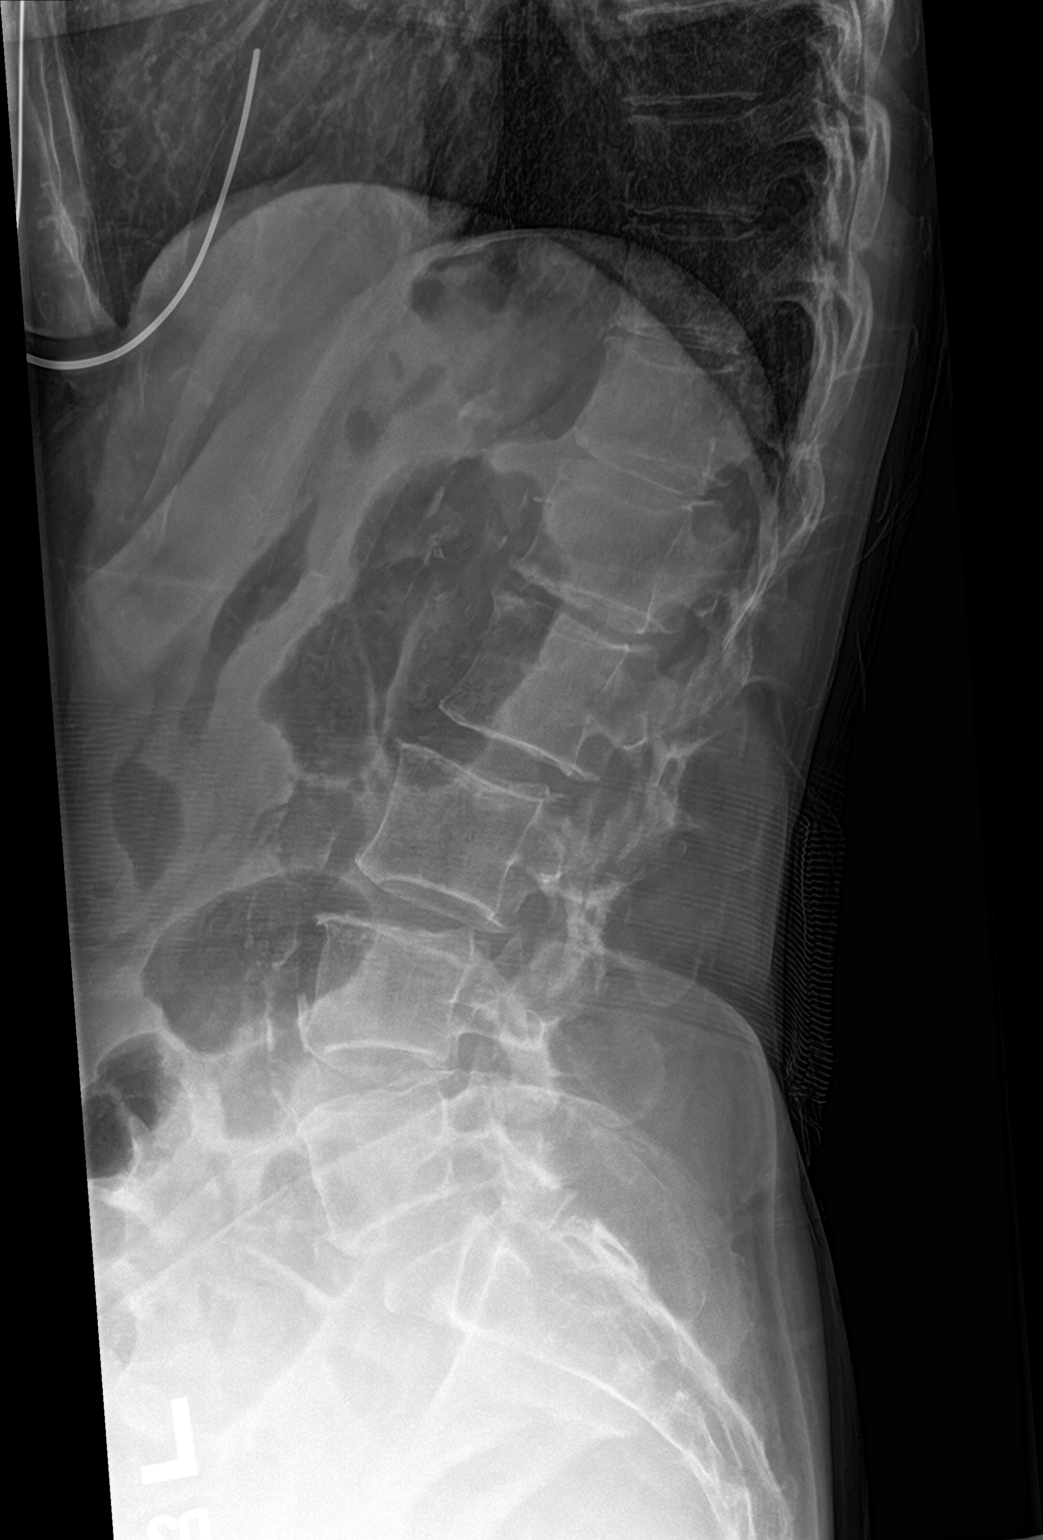

[l-spine spot]
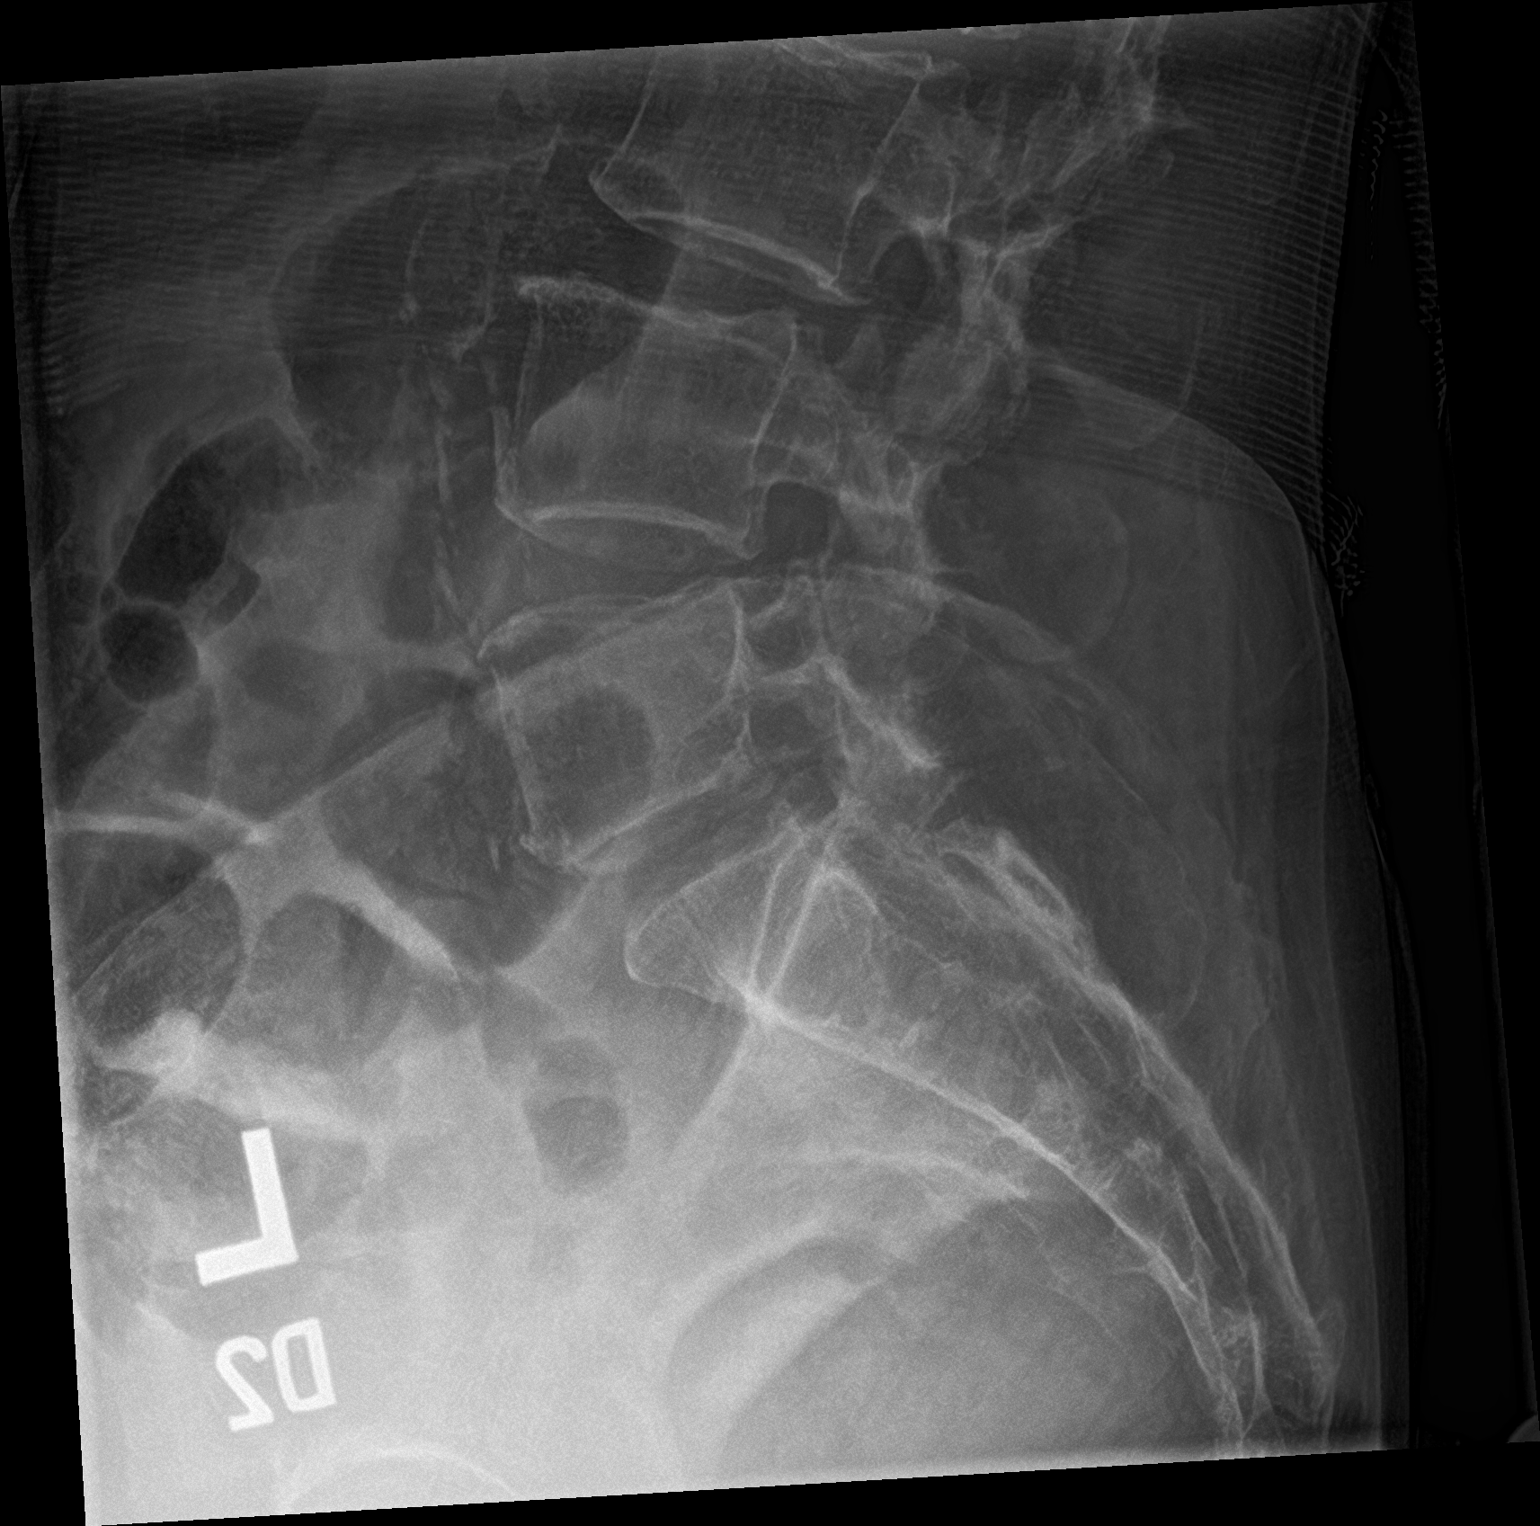

[3 of 3 positions shown; findings below may reference images not displayed]

FINDINGS: Lumbar spine numbered the lowest segmented appearing lumbar shaped
vertebrae on lateral view as L5. Lumbar scoliosis concave left.
Diffuse mild multilevel degenerative change. Mild approximately 4 mm
retrolisthesis L2 on L3, L3 on L4, L4 on L5. This is most likely
degenerative. No acute bony abnormality identified. Pelvic
calcifications consistent with phleboliths. Aortoiliac and visceral
atherosclerotic vascular calcification.
IMPRESSION: 1. Lumbar scoliosis concave left. Diffuse mild multilevel
degenerative change. Mild approximately 4 mm retrolisthesis L2 on
L3, L3 on L4, L4 on L5. This is most likely degenerative. No acute
bony abnormality identified.

2.  Aortoiliac and visceral atherosclerotic vascular disease.

## 2020-10-03 MED ORDER — CIPROFLOXACIN HCL 250 MG PO TABS: 250.0000 mg | ORAL_TABLET | Freq: Two times a day (BID) | ORAL | 0 refills | Status: DC

## 2020-10-03 MED ORDER — COLCHICINE 0.6 MG PO TABS: 0.6000 mg | ORAL_TABLET | Freq: Two times a day (BID) | ORAL | 3 refills | Status: DC

## 2020-10-03 MED ORDER — TRAMADOL HCL 50 MG PO TABS
50.0000 mg | ORAL_TABLET | Freq: Four times a day (QID) | ORAL | 0 refills | Status: AC | PRN
Start: 2020-10-03 — End: 2020-10-08

## 2020-10-03 MED ORDER — DICLOFENAC SODIUM 75 MG PO TBEC: 75.0000 mg | DELAYED_RELEASE_TABLET | Freq: Two times a day (BID) | ORAL | 0 refills | Status: DC | PRN

## 2020-10-03 NOTE — Progress Notes (Signed)
Subjective:    Patient ID: Katrina Walters, female    DOB: [birth year], 65 y.o.   MRN: [id number]  HPI  Pt in for follow up.  Pt had question of covid booster shots.  She states large bruise size of her hands on both thigh. These came up same day later in the evening.Pt had moderna.   Hx of CAD/hyperlipidemia.  Hx of meditaranian fever. She states need refill of colchicine.  Hx of toenail fungus. Prior use oral antifungal helped and now recurrent. Pt states nail cleared for about 2 years.   Hx of osteporosis. Pt can't tolerate oral fosamax.  Low back pain. Chronic in past. Hx of some sciatica. She states standing for long period flares pain. Pt is on diclofenac. She also request refill of tramadol. Pt used to see other provider/speciaist. She is not sure if they accept her insurance.     Review of Systems  Constitutional: Negative for chills, fatigue and fever.  Respiratory: Negative for cough, chest tightness, shortness of breath and wheezing.   Cardiovascular: Negative for chest pain and palpitations.  Gastrointestinal: Negative for abdominal pain, diarrhea, nausea and vomiting.  Genitourinary: Negative for dysuria.  Musculoskeletal: Positive for back pain. Negative for myalgias.  Skin: Negative for rash.  Neurological: Negative for dizziness, syncope, weakness, light-headedness, numbness and headaches.  Hematological: Negative for adenopathy. Does not bruise/bleed easily.  Psychiatric/Behavioral: Negative for behavioral problems and confusion.    Past Medical History:  Diagnosis Date  . Eating disorder   . Genital warts   . Hyperlipidemia   . 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 Marital status: Married    Spouse name: Not on file  . Number of children: Not on file  . Years of education: Not on file  . Highest education level: Not on file  Occupational History  . Occupation: unemployed at the time  Tobacco Use  . Smoking  status: Never Smoker  . Smokeless tobacco: Never Used  Vaping Use  . Vaping Use: Never used  Substance and Sexual Activity  . Alcohol use: Not Currently    Alcohol/week: 0.0 standard drinks    Comment: occassionally  . Drug use: No  . Sexual activity: Not on file  Other Topics Concern  . Not on file  Social History Narrative  . Not on file   Social Determinants of Health   Financial Resource Strain: Not on file  Food Insecurity: Not on file  Transportation Needs: Not on file  Physical Activity: Not on file  Stress: Not on file  Social Connections: Not on file  Intimate Partner Violence: Not on file    Past Surgical History:  Procedure Laterality Date  . ABDOMINAL HYSTERECTOMY  08/2019  . BLADDER SURGERY  08/2019  . BREAST REDUCTION SURGERY  2006  . CARDIAC CATHETERIZATION N/A 07/17/2016   Procedure: Left Heart Cath and Coronary Angiography;  Surgeon: Burnell Blanks, MD;  Location: [place] [facility];  Service: Cardiovascular;  Laterality: N/A;  . COLONOSCOPY  2015  . POLYPECTOMY  2015    Family History  Problem Relation Age of Onset  . Heart disease Mother        died at 63 of heart attack  . Hypertension Mother   . Heart attack Mother   . Heart disease Father        had heart attack at age 43  . Heart attack Father   . Rectal cancer Father 54  .  Cancer Paternal Aunt   . Stomach cancer Paternal Grandmother   . Colon cancer Neg Hx   . Colon polyps Neg Hx   . Esophageal cancer Neg Hx     Allergies  Allergen Reactions  . Amoxicillin     Had fever with flu like symptoms. Has patient had a PCN reaction causing immediate rash, facial/tongue/throat swelling, SOB or lightheadedness with hypotension: no Has patient had a PCN reaction causing severe rash involving mucus membranes or skin necrosis: no Has patient had a PCN reaction that required hospitalization no Has patient had a PCN reaction occurring within the last 10 years: yes If all of the above  answers are "NO", then may proceed with Cephalosporin use.  [name] Clopidogrel & Aspirin Itching  . Atorvastatin     Myalgia  . Crestor [Rosuvastatin]     Hair loss  . Metoprolol Other (See Comments)    itching  . Pravastatin     itching    Current Outpatient Medications on File Prior to Visit  Medication Sig Dispense Refill  . aspirin EC 81 MG tablet Take 81 mg by mouth daily.    . colchicine 0.6 MG tablet Take 1 tablet (0.6 mg total) by mouth 2 (two) times daily. 100 tablet 11  . diclofenac (VOLTAREN) 75 MG EC tablet Take 75 mg by mouth 2 (two) times daily as needed.    . diclofenac sodium (VOLTAREN) 1 % GEL Apply topically 4 (four) times daily.    . diphenhydrAMINE (BENADRYL ALLERGY) 25 MG tablet Take 1 tablet (25 mg total) by mouth at bedtime. 90 tablet 3  . linaclotide (LINZESS) 290 MCG CAPS capsule Take 1 capsule (290 mcg total) by mouth daily before breakfast. 90 capsule 5  . lubiprostone (AMITIZA) 24 MCG capsule Take 1 capsule (24 mcg total) by mouth 2 (two) times daily with a meal. 60 capsule 1  . Misc Natural Products (COMPLETE MENOPAUSE HEALTH PO) Take 1 Dose by mouth daily.     . nebivolol (BYSTOLIC) 2.5 MG tablet Take 1 tablet (2.5 mg total) by mouth daily. 90 tablet 3  . NON FORMULARY daily. CBD oil - for pain    . omeprazole (PRILOSEC) 40 MG capsule TAKE 1 CAPSULE BY MOUTH EVERY DAY 90 capsule 1  . PREMARIN vaginal cream Place 6.21 Applicatorfuls vaginally 2 (two) times a week. 30 g 0  . rosuvastatin (CRESTOR) 10 MG tablet Take 10 mg by mouth every other day.     [name] alendronate (FOSAMAX) 70 MG tablet Take 1 tablet (70 mg total) by mouth once a week. Take with a full glass of water on an empty stomach. 4 tablet 11  . Evolocumab (REPATHA SURECLICK) 308 MG/ML SOAJ Inject 140 mg into the skin every 14 (fourteen) days. (Patient not taking: Reported on 06/28/2020) 2 pen 11  . methocarbamol (ROBAXIN) 500 MG tablet TAKE 2 TABLETS BY MOUTH TWICE A DAY AS NEEDED 60 tablet 0   No current  facility-administered medications on file prior to visit.    BP 126/64   Pulse 83   Temp 98.6 F (37 C) (Oral)   Resp 16   Ht 5\' 3"  (1.6 m)   Wt 111 lb 9.6 oz (50.6 kg)   LMP  (LMP Unknown)   SpO2 100%   BMI 19.77 kg/m       Objective:   Physical Exam   General Mental Status- Alert. General Appearance- Not in acute distress.   Skin General: Color- Normal Color. Moisture- Normal Moisture.  Neck Carotid Arteries- Normal color. Moisture- Normal Moisture. No carotid bruits. No JVD.  Chest and Lung Exam Auscultation: Breath Sounds:-Normal.  Cardiovascular Auscultation:Rythm- Regular. Murmurs & Other Heart Sounds:Auscultation of the heart reveals- No Murmurs.  Abdomen Inspection:-Inspeection Normal. Palpation/Percussion:Note:No mass. Palpation and Percussion of the abdomen reveal- Non Tender, Non Distended + BS, no rebound or guarding.   Neurologic Cranial Nerve exam:- CN III-XII intact(No nystagmus), symmetric smile. Strength:- 5/5 equal and symmetric strength both upper and lower extremities.     Assessment & Plan:  History of coronary artery disease and hyperlipidemia.  Followed by cardiologist.  Presently on Crestor low-dose three times a week.  We will get fasting lipid panel today.  History of Mediterranean fever.  Refilled colchicine today.  Also history of chronic low back pain with reported herniated disc.  Question whether or not your prior back specialist will now accept your insurance.  We will get lumbar spine x-ray today.  Refilled her diclofenac and gave limited number of tramadol to use sparingly for severe pain.  History of osteoporosis.  Unable to tolerate Fosamax.  Looks like last DEXA scan was done in 2017.  Placed order for new bone density scan.  Will review that and then likely try to order Prolia.  History of recent dysuria and frequent urination.  Urine POCT done today and will send urine out for culture.  Prescribe 3 days of low-dose Cipro  pending urine test results.  History of GERD.  Continue omeprazole.  Follow-up in 3 to 6 months or as needed.  Possibly sooner depending on lab results.  Time spent with patient today was40   minutes which consisted of chart review, discussing diagnosis, work up treatment and documentation.

## 2020-10-03 NOTE — Patient Instructions (Addendum)
History of coronary artery disease and hyperlipidemia.  Followed by cardiologist.  Presently on Crestor low-dose three times a week.  We will get fasting lipid panel today.  History of Mediterranean fever.  Refilled colchicine today.  Also history of chronic low back pain with reported herniated disc.  Question whether or not your prior back specialist will now accept your insurance.  We will get lumbar spine x-ray today.  Refilled her diclofenac and gave limited number of tramadol to use sparingly for severe pain.  History of osteoporosis.  Unable to tolerate Fosamax.  Looks like last DEXA scan was done in 2017.  Placed order for new bone density scan.  Will review that and then likely try to order Prolia.  History of recent dysuria and frequent urination.  Urine POCT done today and will send urine out for culture.  Prescribe 3 days of low-dose Cipro pending urine test results.  History of GERD.  Continue omeprazole.  Vaccine counseling on covid vaccine discussed. Benefit vs risk. Also booster now vs waiting for potential new booster omicron specific  Follow-up in 3 to 6 months or as needed.  Possibly sooner depending on lab results.

## 2020-10-04 ENCOUNTER — Encounter: Payer: Self-pay | Admitting: Medical

## 2020-10-04 LAB — COMPREHENSIVE METABOLIC PANEL
ALT: 13 U/L (ref 0–35)
AST: 16 U/L (ref 0–37)
Albumin: 4.3 g/dL (ref 3.5–5.2)
Alkaline Phosphatase: 64 U/L (ref 39–117)
BUN: 15 mg/dL (ref 6–23)
CO2: 30 mEq/L (ref 19–32)
Calcium: 9.5 mg/dL (ref 8.4–10.5)
Chloride: 103 mEq/L (ref 96–112)
Creatinine, Ser: 0.76 mg/dL (ref 0.40–1.20)
GFR: 83.27 mL/min (ref >60.00)
Glucose, Bld: 73 mg/dL (ref 70–99)
Potassium: 4.6 mEq/L (ref 3.5–5.1)
Sodium: 138 mEq/L (ref 135–145)
Total Bilirubin: 0.5 mg/dL (ref 0.2–1.2)
Total Protein: 7.2 g/dL (ref 6.0–8.3)

## 2020-10-04 LAB — LIPID PANEL
Cholesterol: 143 mg/dL (ref 0–200)
HDL: 37.5 mg/dL — ABNORMAL LOW (ref >39.00)
LDL Cholesterol: 83 mg/dL (ref 0–99)
NonHDL: 105.96
Total CHOL/HDL Ratio: 4
Triglycerides: 116 mg/dL (ref 0.0–149.0)
VLDL: 23.2 mg/dL (ref 0.0–40.0)

## 2020-10-06 ENCOUNTER — Telehealth: Payer: Self-pay | Admitting: Medical

## 2020-10-06 DIAGNOSIS — G8929 Other chronic pain: Secondary | ICD-10-CM

## 2020-10-06 NOTE — Telephone Encounter (Signed)
Referral to back specialist placed. 

## 2020-10-07 LAB — URINE CULTURE
MICRO NUMBER:: 11459740
SPECIMEN QUALITY:: ADEQUATE

## 2020-10-07 LAB — VITAMIN D 1,25 DIHYDROXY
Vitamin D 1, 25 (OH)2 Total: 54 pg/mL (ref 18–72)
Vitamin D2 1, 25 (OH)2: <8 pg/mL
Vitamin D3 1, 25 (OH)2: 54 pg/mL

## 2020-10-08 ENCOUNTER — Telehealth: Payer: Self-pay | Admitting: Medical

## 2020-10-08 MED ORDER — TERBINAFINE HCL 250 MG PO TABS: 250.0000 mg | ORAL_TABLET | Freq: Every day | ORAL | 0 refills | Status: DC

## 2020-10-08 NOTE — Telephone Encounter (Signed)
Lamisil rx sent to pt pharmacy. Onychomycosis of toenails.

## 2020-10-08 NOTE — Progress Notes (Signed)
Cardiology Clinic Note   Patient Name: Katrina Walters Date of Encounter: 10/09/2020  Primary Care Provider:  Mackie Pai, PA-C Primary Cardiologist:  Skeet Latch, MD  Patient Profile    Katrina Walters 65 year old female presents the clinic today for follow-up evaluation of her coronary artery disease, dyslipidemia, and palpitations.  Past Medical History    Past Medical History:  Diagnosis Date  . Eating disorder   . Genital warts   . Hyperlipidemia   . 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 ABDOMINAL HYSTERECTOMY  08/2019  . BLADDER SURGERY  08/2019  . BREAST REDUCTION SURGERY  2006  . CARDIAC CATHETERIZATION N/A 07/17/2016   Procedure: Left Heart Cath and Coronary Angiography;  Surgeon: Burnell Blanks, MD;  Location: [facility];  Service: Cardiovascular;  Laterality: N/A;  . COLONOSCOPY  2015  . POLYPECTOMY  2015    Allergies  Allergies  Allergen Reactions  . Amoxicillin     Had fever with flu like symptoms. Has patient had a PCN reaction causing immediate rash, facial/tongue/throat swelling, SOB or lightheadedness with hypotension: no Has patient had a PCN reaction causing severe rash involving mucus membranes or skin necrosis: no Has patient had a PCN reaction that required hospitalization no Has patient had a PCN reaction occurring within the last 10 years: yes If all of the above answers are "NO", then may proceed with Cephalosporin use.  [name] Clopidogrel & Aspirin Itching  . Atorvastatin     Myalgia  . Crestor [Rosuvastatin]     Hair loss  . Metoprolol Other (See Comments)    itching  . Pravastatin     itching    History of Present Illness    Katrina Walters has a PMH of dyslipidemia, palpitations, shortness of breath, Mediterranean fever, binge eating disorder, and coronary artery disease. She was previously seen with exertion shortness of breath. She had none coronary calcium with a score of 376 in  2015 which placed her at a 98% for age and gender. She was referred for stress testing 3/16 but was unable to complete due to cost. She was able to perform ETT 10/17. It showed notable depression of 2 mm in the ST segment inferiorly. She was referred for cardiac catheterization 1/17 that showed an LVEF of 65%, 100% distal RCA, 50% mid RCA, 40% ostial left main, 50% mid LAD, 70% D1 lesions. It was felt that her symptoms were caused by a chronically occluded RCA which had left to right collaterals. Medical management was recommended with consideration for CABG if her symptoms persisted. She was started on rosuvastatin but did not tolerate due to myalgia. She was switched to a atorvastatin. However this was stopped due to rash and swelling. She was referred to Pharm.D. and was started on Repatha which was stopped due to pruritus. She was started back on rosuvastatin but was still unable to tolerate it. She was started again on Repatha 8/19. She has tolerated it well. It was felt that her pruritus may be related to other medications.  She is also struggled with back pain which required injections and diclofenac. When she was last seen by Dr. Oval Linsey on 06/28/2020 she continued to walk for exercise regularly. Her metoprolol was discontinued due to itching and hair loss. Since that time she reported noting more shortness of breath when walking. She also felt like her resting heart rate was higher. She once again developed pruritus with Repatha and now is taking it  with Benadryl.. She underwent cardiology evaluation at [facility] 10/20 due to change in insurance. She was referred for echocardiogram 11/20 which showed LVEF of 60-65% and was otherwise unremarkable. She also underwent nuclear stress test was negative for ischemia. She continued to exercise regularly. She denied lower extremity edema, orthopnea, and PND. She underwent repair for uterovaginal prolapse 12/20 which caused her to struggle with constipation  since the surgery.  She presents the clinic today for follow-up evaluation and states she was unable to tolerate both Repatha and nebivolol due to itching.  She reports that her palpitations are intermittent and brief.  She reports that they dissipate with position changes.  She states that she also only takes her rosuvastatin every other day due to itching.  She tries to remain physically active but is limited due to her chronic lower back pain.  She is able to walk around 20 minutes of daily.  She reports that she still has trouble with constipation and is now taking laxative to assist with bowel movements.  I will refer her to Pharm.D. lipid clinic for further evaluation of her cholesterol medication and recommendations.  We will give her the high-fiber diet sheet, and have her follow-up in 6 months.  Today she denies chest pain, shortness of breath, lower extremity edema, fatigue, palpitations, melena, hematuria, hemoptysis, diaphoresis, weakness, presyncope, syncope, orthopnea, and PND.   Home Medications    Prior to Admission medications   Medication Sig Start Date End Date Taking? Authorizing Provider  alendronate (FOSAMAX) 70 MG tablet Take 1 tablet (70 mg total) by mouth once a week. Take with a full glass of water on an empty stomach. 01/10/20   Saguier, [name], PA-C  aspirin EC 81 MG tablet Take 81 mg by mouth daily.    [provider]  ciprofloxacin (CIPRO) 250 MG tablet Take 1 tablet (250 mg total) by mouth 2 (two) times daily. 10/03/20   Saguier, [name], PA-C  colchicine 0.6 MG tablet Take 1 tablet (0.6 mg total) by mouth 2 (two) times daily. 10/03/20   Saguier, [name], PA-C  diclofenac (VOLTAREN) 75 MG EC tablet Take 1 tablet (75 mg total) by mouth 2 (two) times daily as needed. 10/03/20   Saguier, [name], PA-C  diclofenac sodium (VOLTAREN) 1 % GEL Apply topically 4 (four) times daily.    [provider]  diphenhydrAMINE (BENADRYL ALLERGY) 25 MG tablet Take 1 tablet (25  mg total) by mouth at bedtime. 06/28/20   Skeet Latch, MD  Evolocumab (REPATHA SURECLICK) [id number] MG/ML SOAJ Inject 140 mg into the skin every 14 (fourteen) days. Patient not taking: Reported on 06/28/2020 03/26/20   Skeet Latch, MD  linaclotide [facility]) 290 MCG CAPS capsule Take 1 capsule (290 mcg total) by mouth daily before breakfast. 05/17/20   Cirigliano, Vito V, DO  lubiprostone (AMITIZA) 24 MCG capsule Take 1 capsule (24 mcg total) by mouth 2 (two) times daily with a meal. 03/26/20   Saguier, [name], PA-C  methocarbamol (ROBAXIN) 500 MG tablet TAKE 2 TABLETS BY MOUTH TWICE A DAY AS NEEDED 10/15/17   Saguier, [name], PA-C  Misc Natural Products (COMPLETE MENOPAUSE HEALTH PO) Take 1 Dose by mouth daily.     [provider]  nebivolol (BYSTOLIC) 2.5 MG tablet Take 1 tablet (2.5 mg total) by mouth daily. 06/28/20   Skeet Latch, MD  NON FORMULARY daily. CBD oil - for pain    [provider]  omeprazole (PRILOSEC) 40 MG capsule TAKE 1 CAPSULE BY MOUTH EVERY DAY 04/03/20  Saguier, [name], PA-C  PREMARIN vaginal cream Place 7.51 Applicatorfuls vaginally 2 (two) times a week. 09/29/19   Saguier, [name], PA-C  rosuvastatin (CRESTOR) 10 MG tablet Take 10 mg by mouth every other day.     [provider]  traMADol (ULTRAM) 50 MG tablet Take 1 tablet (50 mg total) by mouth every 6 (six) hours as needed for up to 5 days for severe pain. 10/03/20 10/08/20  Saguier, [name], PA-C    Family History    Family History  Problem Relation Age of Onset  . Heart disease Mother        died at 71 of heart attack  . Hypertension Mother   . Heart attack Mother   . Heart disease Father        had heart attack at age 13  . Heart attack Father   . Rectal cancer Father 41  . Cancer Paternal Aunt   . Stomach cancer Paternal Grandmother   . Colon cancer Neg Hx   . Colon polyps Neg Hx   . Esophageal cancer Neg Hx    She indicated that her mother is deceased. She indicated that her  father is deceased. She indicated that her maternal grandmother is deceased. She indicated that her maternal grandfather is deceased. She indicated that her paternal grandmother is deceased. She indicated that her paternal grandfather is deceased. She indicated that the status of her paternal aunt is unknown. She indicated that the status of her neg hx is unknown.  Social History    Social History   Socioeconomic History  . Marital status: Married    Spouse name: Not on file  . Number of children: Not on file  . Years of education: Not on file  . Highest education level: Not on file  Occupational History  . Occupation: unemployed at the time  Tobacco Use  . Smoking status: Never Smoker  . Smokeless tobacco: Never Used  Vaping Use  . Vaping Use: Never used  Substance and Sexual Activity  . Alcohol use: Not Currently    Alcohol/week: 0.0 standard drinks    Comment: occassionally  . Drug use: No  . Sexual activity: Not on file  Other Topics Concern  . Not on file  Social History Narrative  . Not on file   Social Determinants of Health   Financial Resource Strain: Not on file  Food Insecurity: Not on file  Transportation Needs: Not on file  Physical Activity: Not on file  Stress: Not on file  Social Connections: Not on file  Intimate Partner Violence: Not on file     Review of Systems    General:  No chills, fever, night sweats or weight changes.  Cardiovascular:  No chest pain, dyspnea on exertion, edema, orthopnea, palpitations, paroxysmal nocturnal dyspnea. Dermatological: No rash, lesions/masses Respiratory: No cough, dyspnea Urologic: No hematuria, dysuria Abdominal:   No nausea, vomiting, diarrhea, bright red blood per rectum, melena, or hematemesis Neurologic:  No visual changes, wkns, changes in mental status. All other systems reviewed and are otherwise negative except as noted above.  Physical Exam    VS:  BP 110/66 (BP Location: Left Arm, Patient Position:  Sitting, Cuff Size: Normal)   Pulse 85   Wt 111 lb 9.6 oz (50.6 kg)   LMP  (LMP Unknown)   SpO2 98%   BMI 19.77 kg/m  , BMI Body mass index is 19.77 kg/m. GEN: Well nourished, well developed, in no acute distress. HEENT: normal. Neck: Supple, no JVD,  carotid bruits, or masses. Cardiac: RRR, no murmurs, rubs, or gallops. No clubbing, cyanosis, edema.  Radials/DP/PT 2+ and equal bilaterally.  Respiratory:  Respirations regular and unlabored, clear to auscultation bilaterally. GI: Soft, nontender, nondistended, BS + x 4. MS: no deformity or atrophy. Skin: warm and dry, no rash. Neuro:  Strength and sensation are intact. Psych: Normal affect.  Accessory Clinical Findings    Recent Labs: 10/03/2020: ALT 13; BUN 15; Creatinine, Ser 0.76; Potassium 4.6; Sodium 138   Recent Lipid Panel    Component Value Date/Time   CHOL 143 10/03/2020 1353   CHOL 121 06/28/2020 1150   TRIG 116.0 10/03/2020 1353   HDL 37.50 (L) 10/03/2020 1353   HDL 38 (L) 06/28/2020 1150   CHOLHDL 4 10/03/2020 1353   VLDL 23.2 10/03/2020 1353   LDLCALC 83 10/03/2020 1353   LDLCALC 62 06/28/2020 1150    ECG personally reviewed by me today-none today.    Cardiac catheterization 07/17/2016  The left ventricular systolic function is normal.  LV end diastolic pressure is normal.  The left ventricular ejection fraction is greater than 65% by visual estimate.  There is no mitral valve regurgitation.  Prox RCA lesion, 20 %stenosed.  Mid RCA lesion, 50 %stenosed.  Dist RCA lesion, 100 %stenosed.  Ost LM lesion, 40 %stenosed.  Ost LM to LM lesion, 40 %stenosed.  Mid LAD lesion, 50 %stenosed.  1st Diag lesion, 70 %stenosed.  Ost 1st Mrg to 1st Mrg lesion, 40 %stenosed.  Ost LAD to Prox LAD lesion, 20 %stenosed.  Prox RCA to Mid RCA lesion, 20 %stenosed.   1. Triple vessel CAD  2. Mild to moderate stenosis left main artery. This appears to be eccentric and does not appear to be flow limiting.  3.  Moderate stenosis mid LAD. The small caliber diagonal branch has moderately severe stenosis.  4. Mild to moderate stenosis in the obtuse marginal branch of the Circumflex 5. The RCA is a large dominant vessel with moderate distal stenosis prior to total occlusion of the vessel at the distal bifurcation. The posterolateral artery and the posterior descending artery fills from left to right collaterals.  6. Normal LV systolic function  Recommendations: Her abnormal stress test is likely due to the chronic occlusion of the distal RCA. She has mild to moderate stenosis in the left main artery as well as moderate stenosis in the LAD. I think the best plan for now is medical management of her CAD. If she fails medical management, will have to consider CABG.  Diagnostic Dominance: Right    Intervention    Assessment & Plan   1. Coronary artery disease-no chest pain today. No increase shortness of breath or DOE. Underwent cardiac catheterization and was noted to have three-vessel CAD. Medical management was recommended. Unable to take metoprolol due to hair loss and unable to take carvedilol due to interaction with colchicine.  Stopped Repatha due to itching. Continue nebivolol, aspirin, rosuvastatin,propafenone Heart healthy low-sodium diet-salty 6 given Increase physical activity as tolerated  Hyperlipidemia-10/03/2020: Cholesterol 143; HDL 37.50; LDL Cholesterol 83; Triglycerides 116.0; VLDL 23.2 Continue rosuvastatin Heart healthy low-sodium diet-salty 6 given Increase physical activity as tolerated Refer to lipid clinic  Palpitations-brief and intermittent.  Resolved with position changes.  Discussed not adding further medications at this time due to medication sensitivity.  Continue to monitor. Avoid triggers caffeine, chocolate, EtOH, dehydration etc. 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  Disposition: Follow-up with Dr. Oval Linsey or me and 6 months.  Jossie Ng. Demontay Grantham NP-C    10/09/2020, 2:05 PM [address] Office [phone] Fax [phone]  Notice: This dictation was prepared with Dragon dictation along with smaller phrase technology. Any transcriptional errors that result from this process are unintentional and may not be corrected upon review.  I spent 15 minutes examining this patient, reviewing medications, and using patient centered shared decision making involving her cardiac care.  Prior to her visit I spent greater than 20 minutes reviewing her past medical history,  medications, and prior cardiac tests.

## 2020-10-09 ENCOUNTER — Other Ambulatory Visit: Payer: Self-pay | Admitting: Medical

## 2020-10-09 ENCOUNTER — Encounter: Payer: Self-pay | Admitting: General Practice

## 2020-10-09 ENCOUNTER — Ambulatory Visit: Payer: Medicare Other | Admitting: General Practice

## 2020-10-09 ENCOUNTER — Other Ambulatory Visit: Payer: Self-pay

## 2020-10-09 VITALS — BP 110/66 | HR 85 | Wt 111.6 lb

## 2020-10-09 DIAGNOSIS — E785 Hyperlipidemia, unspecified: Secondary | ICD-10-CM

## 2020-10-09 DIAGNOSIS — R002 Palpitations: Secondary | ICD-10-CM

## 2020-10-09 DIAGNOSIS — I25118 Atherosclerotic heart disease of native coronary artery with other forms of angina pectoris: Secondary | ICD-10-CM

## 2020-10-09 NOTE — Patient Instructions (Addendum)
Medication Instructions:  The current medical regimen is effective;  continue present plan and medications as directed. Please refer to the Current Medication list given to you today.  *If you need a refill on your cardiac medications before your next appointment, please call your pharmacy*  Lab Work:   Testing/Procedures:  NONE    NONE  Special Instructions PLEASE INCREASE PHYSICAL ACTIVITY AS TOLERATED  PLEASE READ AND FOLLOW INCREASED FIBER DIET ATTACHED  REFER TO LIPID CLINIC-PHARMACIST, REPATHA ALLERGY  Please try to avoid these triggers:  Do not use any products that have nicotine or tobacco in them. These include cigarettes, e-cigarettes, and chewing tobacco. If you need help quitting, ask your doctor.  Eat heart-healthy foods. Talk with your doctor about the right eating plan for you.  Exercise regularly as told by your doctor.  Stay hydrated  Do not drink alcohol, Caffeine or chocolate.  Lose weight if you are overweight.  Do not use drugs, including cannabis   Follow-Up: Your next appointment:  6 month(s) In Person with Skeet Latch, MD OR IF UNAVAILABLE [place], FNP-C   Please call our office 2 months in advance to schedule this appointment   At [facility], you and your health needs are our priority.  As part of our continuing mission to provide you with exceptional heart care, we have created designated Provider Care Teams.  These Care Teams include your primary Cardiologist (physician) and Advanced Practice Providers (APPs -  Physician Assistants and Nurse Practitioners) who all work together to provide you with the care you need, when you need it.  High-Fiber Eating Plan Fiber, also called dietary fiber, is a type of carbohydrate. It is found foods such as fruits, vegetables, whole grains, and beans. A high-fiber diet can have many health benefits. Your health care provider may recommend a high-fiber diet to help:  Prevent constipation. Fiber can  make your bowel movements more regular.  Lower your cholesterol.  Relieve the following conditions: ? Inflammation of veins in the anus (hemorrhoids). ? Inflammation of specific areas of the digestive tract (uncomplicated diverticulosis). ? A problem of the large intestine, also called the colon, that sometimes causes pain and diarrhea (irritable bowel syndrome, or IBS).  Prevent overeating as part of a weight-loss plan.  Prevent heart disease, type 2 diabetes, and certain cancers. What are tips for following this plan? Reading food labels  Check the nutrition facts label on food products for the amount of dietary fiber. Choose foods that have 5 grams of fiber or more per serving.  The goals for recommended daily fiber intake include: ? Men (age 64 or younger): 34-38 g. ? Men (over age 99): 28-34 g. ? Women (age 59 or younger): 25-28 g. ? Women (over age 30): 22-25 g. Your daily fiber goal is _____________ g.   Shopping  Choose whole fruits and vegetables instead of processed forms, such as apple juice or applesauce.  Choose a wide variety of high-fiber foods such as avocados, lentils, oats, and kidney beans.  Read the nutrition facts label of the foods you choose. Be aware of foods with added fiber. These foods often have high sugar and sodium amounts per serving. Cooking  Use whole-grain flour for baking and cooking.  Cook with brown rice instead of white rice. Meal planning  Start the day with a breakfast that is high in fiber, such as a cereal that contains 5 g of fiber or more per serving.  Eat breads and cereals that are made with  whole-grain flour instead of refined flour or white flour.  Eat brown rice, bulgur wheat, or millet instead of white rice.  Use beans in place of meat in soups, salads, and pasta dishes.  Be sure that half of the grains you eat each day are whole grains. General information  You can get the recommended daily intake of dietary fiber  by: ? Eating a variety of fruits, vegetables, grains, nuts, and beans. ? Taking a fiber supplement if you are not able to take in enough fiber in your diet. It is better to get fiber through food than from a supplement.  Gradually increase how much fiber you consume. If you increase your intake of dietary fiber too quickly, you may have bloating, cramping, or gas.  Drink plenty of water to help you digest fiber.  Choose high-fiber snacks, such as berries, raw vegetables, nuts, and popcorn. What foods should I eat? Fruits Berries. Pears. Apples. Oranges. Avocado. Prunes and raisins. Dried figs. Vegetables Sweet potatoes. Spinach. Kale. Artichokes. Cabbage. Broccoli. Cauliflower. Green peas. Carrots. Squash. Grains Whole-grain breads. Multigrain cereal. Oats and oatmeal. Brown rice. Barley. Bulgur wheat. [place]. Quinoa. Bran muffins. Popcorn. Rye wafer crackers. Meats and other proteins Navy beans, kidney beans, and pinto beans. Soybeans. Split peas. Lentils. Nuts and seeds. Dairy Fiber-fortified yogurt. Beverages Fiber-fortified soy milk. Fiber-fortified orange juice. Other foods Fiber bars. The items listed above may not be a complete list of recommended foods and beverages. Contact a dietitian for more information. What foods should I avoid? Fruits Fruit juice. Cooked, strained fruit. Vegetables Fried potatoes. Canned vegetables. Well-cooked vegetables. Grains White bread. Pasta made with refined flour. White rice. Meats and other proteins Fatty cuts of meat. Fried chicken or fried fish. Dairy Milk. Yogurt. Cream cheese. Sour cream. Fats and oils Butters. Beverages Soft drinks. Other foods Cakes and pastries. The items listed above may not be a complete list of foods and beverages to avoid. Talk with your dietitian about what choices are best for you. Summary  Fiber is a type of carbohydrate. It is found in foods such as fruits, vegetables, whole grains, and beans.  A  high-fiber diet has many benefits. It can help to prevent constipation, lower blood cholesterol, aid weight loss, and reduce your risk of heart disease, diabetes, and certain cancers.  Increase your intake of fiber gradually. Increasing fiber too quickly may cause cramping, bloating, and gas. Drink plenty of water while you increase the amount of fiber you consume.  The best sources of fiber include whole fruits and vegetables, whole grains, nuts, seeds, and beans. This information is not intended to replace advice given to you by your health care provider. Make sure you discuss any questions you have with your health care provider. Document Revised: 12/29/2019 Document Reviewed: 12/29/2019 Elsevier Patient Education  2021 [employer].

## 2020-10-11 ENCOUNTER — Telehealth: Payer: Self-pay | Admitting: Gastroenterology

## 2020-10-11 ENCOUNTER — Ambulatory Visit: Payer: Medicare Other | Admitting: Gastroenterology

## 2020-10-11 ENCOUNTER — Encounter: Payer: Self-pay | Admitting: Gastroenterology

## 2020-10-11 ENCOUNTER — Other Ambulatory Visit: Payer: Self-pay | Admitting: General Surgery

## 2020-10-11 VITALS — BP 108/68 | HR 85 | Ht 63.0 in | Wt 112.2 lb

## 2020-10-11 DIAGNOSIS — K5900 Constipation, unspecified: Secondary | ICD-10-CM | POA: Diagnosis not present

## 2020-10-11 DIAGNOSIS — K649 Unspecified hemorrhoids: Secondary | ICD-10-CM | POA: Diagnosis not present

## 2020-10-11 DIAGNOSIS — R197 Diarrhea, unspecified: Secondary | ICD-10-CM | POA: Diagnosis not present

## 2020-10-11 NOTE — Telephone Encounter (Signed)
Spoke with [name] in scheduling at [facility] procedure moved to 11/12/2020 at 8:30.

## 2020-10-11 NOTE — Telephone Encounter (Signed)
Contacted the patient and she would like to have appointment rescheduled to march 2022.

## 2020-10-11 NOTE — Telephone Encounter (Signed)
Pt is requesting a call back to reschedule her procedure scheduled at the hospital for 10/22/2020.

## 2020-10-11 NOTE — Progress Notes (Signed)
Scheduled the patients covid test and anorectal manometry on 10/22/2020. Notified the patient via mychart with directions and called to give her the dates.

## 2020-10-11 NOTE — Patient Instructions (Addendum)
If you are age 64 or older, your body mass index should be between 23-30. Your Body mass index is 19.88 kg/m. If this is out of the aforementioned range listed, please consider follow up with your Primary Care Provider.  If you are age 26 or younger, your body mass index should be between 19-25. Your Body mass index is 19.88 kg/m. If this is out of the aformentioned range listed, please consider follow up with your Primary Care Provider.   We have given you a package that has Sitzmarks on it. Inside this packet is a capsule that you will take. 5 days later you will come back to the radiology department at [facility] to have an xray.   We will contact you when we have scheduled the anorectal manometry. This procedure is done outpatient at [facility]  Due to recent changes in healthcare laws, you may see the results of your imaging and laboratory studies on MyChart before your provider has had a chance to review them.  We understand that in some cases there may be results that are confusing or concerning to you. Not all laboratory results come back in the same time frame and the provider may be waiting for multiple results in order to interpret others.  Please give [place] 48 hours in order for your provider to thoroughly review all the results before contacting the office for clarification of your results.   Thank you for choosing me and [facility].  Vito Cirigliano, D.O.

## 2020-10-11 NOTE — Progress Notes (Signed)
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  Previously trialed Dulcolax qod and increased hydration without much response, MiraLAX.  Abdominal x-ray 02/2020 with abundant stool burden.  Then trialed Amitiza in 02/2020 with good efficacy but stopped due to upset stomach.  Started on Linzess in 03/2020 with good clinical response. Interestingly, constipation started after hysterectomy in 2020.  Prior baseline was BM Q3d without straining.  History of internal hemorrhoids with intermittent scant BRBPR, typically responsive to OTC Preparation H.  Family history notable for father with colon cancer in his 8s.  Endoscopic history: -Colonoscopy (08/17/2014, Dr. Charisse Klinefelter at [facility]): Single small polypoid polyp at splenic flexure removed with snare (path: Unknown), Internal hemorrhoids. -Colonoscopy (03/2020, Dr. Bryan Lemma): 2 rectal polyps (1 tubular adenoma), tortuous sigmoid colon, lipoma in the ascending colon, internal hemorrhoids.  Repeat in 7 years  HPI:     Patient is a 65 y.o. female presenting to the [facility]astro[facility]ology Clinic for follow-up.  Last seen by me on 05/17/2020.  Was doing well at that time with Linzess, having 3 BMs/week and only occasional straining.  Has since increased water to 64+ ounces/day.  Since last appointment, constipation has worsened despite continued Linzess and good water intake. She has started taking OTC Bisacodyl 5 mg TID. Does have abdominal spasm since starting Bisacodyl. Does have occasional episodes of overflow diarrhea, but no incontinence.   Review of systems:     No chest pain, no SOB, no fevers, no urinary sx   Past Medical History:  Diagnosis Date  . Eating disorder   . Genital warts   .  Hyperlipidemia   . 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 aspirin EC 81 MG tablet Take 81 mg by mouth daily.    . bisacodyl ([employer] C-LAX LAXATIVE) 5 MG EC tablet Take 10-15 mg by mouth daily as needed for moderate constipation (Take 2 tablet one day and 3 tablets the next. Alternate days).    . colchicine 0.6 MG tablet Take 1 tablet (0.6 mg total) by mouth 2 (two) times daily. 180 tablet 3  . diclofenac (VOLTAREN) 75 MG EC tablet Take 1 tablet (75 mg total) by mouth 2 (two) times daily as needed. 60 tablet 0  . diclofenac sodium (VOLTAREN) 1 % GEL Apply topically 4 (four) times daily.    . diphenhydrAMINE (BENADRYL ALLERGY) 25 MG tablet Take 1 tablet (25 mg total) by mouth at bedtime. 90 tablet 3  . linaclotide (LINZESS) 290 MCG CAPS capsule Take 1 capsule (290 mcg total) by mouth daily before breakfast. 90 capsule 5  . methocarbamol (ROBAXIN) 500 MG tablet TAKE 2 TABLETS BY MOUTH TWICE A DAY AS NEEDED 60 tablet 0  . Misc Natural Products (COMPLETE MENOPAUSE HEALTH PO) Take 1 Dose by mouth daily.     . NON FORMULARY daily. CBD oil - for pain    . omeprazole (PRILOSEC) 40 MG capsule TAKE 1 CAPSULE BY MOUTH EVERY DAY 90 capsule 1  . PREMARIN vaginal cream Place 3.41 Applicatorfuls vaginally 2 (two) times a week. 30 g 0  . rosuvastatin (CRESTOR) 10 MG tablet Take 10 mg by mouth  every other day.     . terbinafine (LAMISIL) 250 MG tablet Take 1 tablet (250 mg total) by mouth daily. (Patient not taking: Reported on 10/11/2020) 90 tablet 0   No current facility-administered medications for this visit.    Physical Exam:     BP 108/68   Pulse 85   Ht 5\' 3"  (1.6 m)   Wt 112 lb 4 oz (50.9 kg)   LMP  (LMP Unknown)   BMI 19.88 kg/m   GENERAL:  Pleasant female in NAD PSYCH: : Cooperative, normal affect EENT:   conjunctiva pink, mucous membranes moist, neck supple without masses ABDOMEN:  Nondistended, soft, nontender. No obvious masses, no hepatomegaly,  normal bowel sounds SKIN:  turgor, no lesions seen Musculoskeletal:  Normal muscle tone, normal strength NEURO: Alert and oriented x 3, no focal neurologic deficits Rectal exam: Small external skin tags. Sensation intact and preserved anal wink. No external anal fissures. Normal resting sphincter tone. No palpable mass. No blood on the exam glove. Rectal squeeze pressure moderately reduced. Ability to push preserved with good rectal muscle descent and appropriate relaxation of anal sphincter. (Chaperone: Curlene Labrum, CMA).    IMPRESSION and PLAN:    1) Constipation 2) Overflow diarrhea  Exact etiology for her constipation not entirely clear. 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 Exam today with decreased sphincter squeeze pressure, but appropriate rectal descent and sphincter relaxation during "push" phase. Possibly anterior rectal prolapse? Work-up further as below:  -ARM to evaluate for decreased rectal sensation along with reduced sphincter squeeze pressure and evaluate more formally for Pelvic Floor Dyssynergia -Sitz marker study -If unrevealing, either MR defecography or referral to Colorectal surgery for possible anatomic etiology post op -Can consider change from Linzess to alternative agent pending work-up as above  3) Internal hemorrhoids -Largely well controlled with conservative measures  4) History of colon polyps -Repeat colonoscopy 2028 for ongoing surveillance  RTC in 3 months or sooner as needed  I spent 3535  minutes of time, including in depth chart review, independent review of results as outlined above, communicating results with the patient directly, face-to-face time with the patient, coordinating care, and ordering studies and  medications as appropriate, and documentation.            [place] ,DO, FACG 10/11/2020, 11:07 AM

## 2020-10-12 ENCOUNTER — Encounter: Payer: Self-pay | Admitting: General Surgery

## 2020-10-15 ENCOUNTER — Telehealth: Payer: Self-pay | Admitting: Gastroenterology

## 2020-10-15 NOTE — Telephone Encounter (Signed)
Pt is requesting a call back from a nurse to change her covid test from 10am to 12pm on 3/3

## 2020-10-15 NOTE — Telephone Encounter (Signed)
Changed Covid testing appointment until 11/08/2020 at 1pm. Notified the patient

## 2020-10-16 ENCOUNTER — Ambulatory Visit (HOSPITAL_BASED_OUTPATIENT_CLINIC_OR_DEPARTMENT_OTHER)
Admission: RE | Admit: 2020-10-16 | Discharge: 2020-10-16 | Disposition: A | Discharge status: Home / Self Care | Payer: Medicare Other | Source: Ambulatory Visit | Attending: Gastroenterology | Admitting: Gastroenterology

## 2020-10-16 ENCOUNTER — Other Ambulatory Visit: Payer: Self-pay

## 2020-10-16 DIAGNOSIS — K5900 Constipation, unspecified: Secondary | ICD-10-CM | POA: Diagnosis present

## 2020-10-18 ENCOUNTER — Other Ambulatory Visit (HOSPITAL_COMMUNITY): Payer: Medicare Other

## 2020-10-19 ENCOUNTER — Other Ambulatory Visit: Payer: Self-pay | Admitting: Gastroenterology

## 2020-10-19 MED ORDER — TRULANCE 3 MG PO TABS: 3.0000 mg | ORAL_TABLET | Freq: Every day | ORAL | 6 refills | Status: DC

## 2020-10-24 ENCOUNTER — Ambulatory Visit: Payer: Medicare Other

## 2020-11-08 ENCOUNTER — Other Ambulatory Visit (HOSPITAL_COMMUNITY): Payer: Medicare Other

## 2020-11-08 ENCOUNTER — Other Ambulatory Visit (HOSPITAL_COMMUNITY)
Admission: RE | Admit: 2020-11-08 | Discharge: 2020-11-08 | Disposition: A | Discharge status: Home / Self Care | Payer: Medicare Other | Source: Ambulatory Visit | Attending: Gastroenterology | Admitting: Gastroenterology

## 2020-11-08 DIAGNOSIS — Z20822 Contact with and (suspected) exposure to covid-19: Secondary | ICD-10-CM | POA: Insufficient documentation

## 2020-11-08 DIAGNOSIS — Z01812 Encounter for preprocedural laboratory examination: Secondary | ICD-10-CM | POA: Insufficient documentation

## 2020-11-08 LAB — SARS CORONAVIRUS 2 (TAT 6-24 HRS): SARS Coronavirus 2: NEGATIVE

## 2020-11-12 ENCOUNTER — Ambulatory Visit (HOSPITAL_COMMUNITY)
Admission: RE | Admit: 2020-11-12 | Discharge: 2020-11-12 | Disposition: A | Discharge status: Home / Self Care | Payer: Medicare Other | Attending: Gastroenterology | Admitting: Gastroenterology

## 2020-11-12 ENCOUNTER — Encounter (HOSPITAL_COMMUNITY)
Admission: RE | Disposition: A | Discharge status: Home / Self Care | Payer: Self-pay | Source: Home / Self Care | Attending: Gastroenterology

## 2020-11-12 DIAGNOSIS — K5909 Other constipation: Secondary | ICD-10-CM | POA: Diagnosis present

## 2020-11-12 DIAGNOSIS — K5902 Outlet dysfunction constipation: Secondary | ICD-10-CM | POA: Diagnosis not present

## 2020-11-12 DIAGNOSIS — Z7982 Long term (current) use of aspirin: Secondary | ICD-10-CM | POA: Diagnosis not present

## 2020-11-12 DIAGNOSIS — Z79899 Other long term (current) drug therapy: Secondary | ICD-10-CM | POA: Diagnosis not present

## 2020-11-12 DIAGNOSIS — Z800 Family history of malignant neoplasm of digestive organs: Secondary | ICD-10-CM | POA: Insufficient documentation

## 2020-11-12 HISTORY — PX: ANAL RECTAL MANOMETRY: SHX6358

## 2020-11-12 SURGERY — MANOMETRY, ANORECTAL

## 2020-11-12 NOTE — Progress Notes (Signed)
Anal rectal manometry performed per protocol without complications.  Patient tolerated well.  Balloon expulsion test performed per protocol without complications.  Patient tolerated with pain at anal opening only while balloon was passing through anus.  Per patient 5 out of 10 pain with the balloon passing through her anal opening.  No pain after expelling balloon.  No pain until it was at the anal opening trying to pass through the opening. Expelled balloon after 2 minutes.

## 2020-11-13 ENCOUNTER — Encounter (HOSPITAL_COMMUNITY): Payer: Self-pay | Admitting: Gastroenterology

## 2020-11-13 NOTE — Interval H&P Note (Signed)
History and Physical Interval Note:  11/13/2020 9:54 AM  Katrina Walters  has presented today for surgery, with the diagnosis of constipation.  The various methods of treatment have been discussed with the patient and family. After consideration of risks, benefits and other options for treatment, the patient has consented to  Procedure(s): ANO RECTAL MANOMETRY (N/A) BALLOON EXPULSION TEST as a surgical intervention.  The patient's history has been reviewed, patient examined, no change in status, stable for surgery.  I have reviewed the patient's chart and labs.  Questions were answered to the patient's satisfaction.     [name] [name]

## 2020-11-13 NOTE — H&P (Signed)
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 Previously trialed Dulcolax qodand increased hydration without much response, MiraLAX.Abdominal x-ray 02/2020 with abundant stool burden. Then trialed Amitiza in 02/2020 with good efficacy but stopped due to upset stomach. Started on Linzess in 7/2021with initial good clinical response, but decreased efficacy over a couple of months.  Occasional overflow diarrhea. Interestingly, constipation started after hysterectomy in 2020.  Prior baseline was BM Q3d without straining.  History of internal hemorrhoids with intermittent scant BRBPR, typically responsive to OTC Preparation H.  Family history notable for father with colon cancer in his 45s.  Endoscopic history: -Colonoscopy (08/17/2014, Dr. Charisse Klinefelter at [facility]): Single small polypoid polyp at splenic flexure removed with snare (path: Unknown), Internal hemorrhoids. -Colonoscopy (03/2020, Dr. Bryan Lemma): 2 rectal polyps (1 tubular adenoma), tortuous sigmoid colon, lipoma in the ascending colon, internal hemorrhoids. Repeat in 7 years  HPI:     Patient is a 65 y.o. female presenting to [facility] for anorectal manometry for evaluation of chronic constipation as above.   Review of systems:     No chest pain, no SOB, no fevers, no urinary sx   Past Medical History:  Diagnosis Date  . Eating disorder   . Genital warts   . Hyperlipidemia   . 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    Patient's surgical history, family medical history, social history, medications and allergies were all reviewed in Epic    No current facility-administered medications for this encounter.   Current Outpatient Medications  Medication Sig Dispense  Refill  . aspirin EC 81 MG tablet Take 81 mg by mouth daily.    . bisacodyl ([employer] C-LAX LAXATIVE) 5 MG EC tablet Take 10-15 mg by mouth daily as needed for moderate constipation (Take 2 tablet one day and 3 tablets the next. Alternate days).    . colchicine 0.6 MG tablet Take 1 tablet (0.6 mg total) by mouth 2 (two) times daily. 180 tablet 3  . diclofenac (VOLTAREN) 75 MG EC tablet Take 1 tablet (75 mg total) by mouth 2 (two) times daily as needed. 60 tablet 0  . diclofenac sodium (VOLTAREN) 1 % GEL Apply topically 4 (four) times daily.    . diphenhydrAMINE (BENADRYL ALLERGY) 25 MG tablet Take 1 tablet (25 mg total) by mouth at bedtime. 90 tablet 3  . linaclotide (LINZESS) 290 MCG CAPS capsule Take 1 capsule (290 mcg total) by mouth daily before breakfast. 90 capsule 5  . Misc Natural Products (COMPLETE MENOPAUSE HEALTH PO) Take 1 Dose by mouth daily.     . NON FORMULARY daily. CBD oil - for pain    . omeprazole (PRILOSEC) 40 MG capsule TAKE 1 CAPSULE BY MOUTH EVERY DAY 90 capsule 1  . Plecanatide (TRULANCE) 3 MG TABS Take 3 mg by mouth daily. 30 tablet 6  . PREMARIN vaginal cream Place 8.85 Applicatorfuls vaginally 2 (two) times a week. 30 g 0  . rosuvastatin (CRESTOR) 10 MG tablet Take 10 mg by mouth every other day.     . terbinafine (LAMISIL) 250 MG tablet Take 1 tablet (250 mg total) by mouth daily. (Patient not taking: Reported on 10/11/2020) 90 tablet 0     IMPRESSION and PLAN:    1) Chronic Constipation -ARM -To follow-up in the GI clinic to review  results          Lavena Bullion ,DO, FACG 11/13/2020, 9:52 AM

## 2020-11-16 ENCOUNTER — Telehealth: Payer: Self-pay | Admitting: Gastroenterology

## 2020-11-16 NOTE — Telephone Encounter (Signed)
Since she has trialed Amitiza, Linzess, and now Trulance, will try augmenting medication rather than exchanging. Recommend continuing Trulance for now, and start Miralax 1 cap/daily and can titrate up or down to maintain soft stools. Will update her when ARM results have returned.

## 2020-11-16 NOTE — Telephone Encounter (Signed)
Patient informed that her manometry results have not been reviewed at this time. She would like a different medication for her constipation. She states that Trulance . Please advise

## 2020-11-16 NOTE — Telephone Encounter (Signed)
Spoke to patient to inform her that she can try Miralax 1 capful daily. She may titrate as needed to archive a soft stools. Patient voiceed understanding.

## 2020-11-18 ENCOUNTER — Other Ambulatory Visit: Payer: Self-pay | Admitting: Gastroenterology

## 2020-11-20 DIAGNOSIS — K5902 Outlet dysfunction constipation: Secondary | ICD-10-CM

## 2020-11-26 ENCOUNTER — Telehealth: Payer: Self-pay | Admitting: Gastroenterology

## 2020-11-26 NOTE — Telephone Encounter (Signed)
Patient is unable to get Trulance right now because it is back ordered. Pt will come to the clinic to pick up samples in [facility] office. 5 bottles given to pt with three 3 mg tablets in each. Lot #[id number] 5 bottles

## 2020-11-27 ENCOUNTER — Telehealth: Payer: Self-pay | Admitting: General Surgery

## 2020-11-27 DIAGNOSIS — M6289 Other specified disorders of muscle: Secondary | ICD-10-CM

## 2020-11-27 DIAGNOSIS — K5902 Outlet dysfunction constipation: Secondary | ICD-10-CM

## 2020-11-27 NOTE — Telephone Encounter (Signed)
-----   Message from [place], DO sent at 11/27/2020  8:28 AM EDT ----- Anorectal manometry notable for the following: -Decreased resting pressure with normal squeeze pressure -Incomplete sphincter relaxation during normal intrarectal squeeze phase -Decreased sensation and abnormal balloon expulsion  Results consistent with week internal anal sphincter pressure along with pelvic floor dyssynergia and decreased rectal sensation.  Recommend referral to PT for pelvic floor retraining and biofeedback

## 2020-11-27 NOTE — Telephone Encounter (Signed)
Tried to contact the patient and the phone rang and went to a busy signal.

## 2020-11-28 NOTE — Telephone Encounter (Signed)
Pt called asking to speak with you, pls call her at [phone].

## 2020-11-28 NOTE — Telephone Encounter (Signed)
Spoke with Katrina Walters and explained to her verbatim what the anorectal manometry showed. Expressed to her that she would benefit from having PT for pelvic floor retraining and biofeedback. The patient agreed with the plan and verbalized understanding. Expressed we would place an order and they will call her with an appointment.

## 2020-12-05 MED ORDER — LINACLOTIDE 290 MCG PO CAPS: 290.0000 ug | ORAL_CAPSULE | Freq: Every day | ORAL | 5 refills | Status: DC

## 2020-12-05 NOTE — Telephone Encounter (Signed)
Yes, ok to go back on Linzess 290 mcg. Please give 3 month supply with RF5. Thanks.

## 2020-12-05 NOTE — Telephone Encounter (Signed)
Notified the patient that Dr Antony Madura her going back on Linzess 290 mcg. Explained to the patient I will send out the prescription immediately. Patient verbalized understanding.

## 2020-12-05 NOTE — Telephone Encounter (Signed)
Pt is requesting a call back from a nurse to discuss switching her Trulance to Linzess.

## 2020-12-05 NOTE — Telephone Encounter (Signed)
The patient contacted the office stating she is not satisfied with Trulance. She is having to a hard time passing stool. She is taking a laxative and drinking 4 bottles of water with it and still nothing. She would like to know if she may back on Linzess 290 mcg?

## 2020-12-05 NOTE — Addendum Note (Signed)
Addended by: Lanny Hurst A on: 12/05/2020 10:43 AM   Modules accepted: Orders

## 2021-01-01 ENCOUNTER — Telehealth: Payer: Self-pay | Admitting: Cardiovascular Disease

## 2021-01-01 NOTE — Telephone Encounter (Signed)
Pt is requesting a provider switch from Dr. Skeet Latch to Dr. Rogers Seeds

## 2021-01-16 ENCOUNTER — Ambulatory Visit: Payer: Medicare Other | Admitting: Physical Therapy

## 2021-01-24 ENCOUNTER — Other Ambulatory Visit: Payer: Self-pay

## 2021-01-24 ENCOUNTER — Encounter: Payer: Self-pay | Admitting: Physical Therapy

## 2021-01-24 ENCOUNTER — Ambulatory Visit: Payer: Medicare Other | Attending: Gastroenterology | Admitting: Physical Therapy

## 2021-01-24 DIAGNOSIS — R279 Unspecified lack of coordination: Secondary | ICD-10-CM | POA: Diagnosis not present

## 2021-01-24 DIAGNOSIS — M6281 Muscle weakness (generalized): Secondary | ICD-10-CM

## 2021-01-24 DIAGNOSIS — R293 Abnormal posture: Secondary | ICD-10-CM

## 2021-01-24 NOTE — Patient Instructions (Addendum)
Toileting Techniques for Bowel Movements (Defecation) Using your belly (abdomen) and pelvic floor muscles to have a bowel movement is usually instinctive.  Sometimes people can have problems with these muscles and have to relearn proper defecation (emptying) techniques.  If you have weakness in your muscles, organs that are falling out, decreased sensation in your pelvis, or ignore your urge to go, you may find yourself straining to have a bowel movement.  You are straining if you are: . holding your breath or taking in a huge gulp of air and holding it  . keeping your lips and jaw tensed and closed tightly . turning red in the face because of excessive pushing or forcing . developing or worsening your  hemorrhoids . getting faint while pushing . not emptying completely and have to defecate many times a day  If you are straining, you are actually making it harder for yourself to have a bowel movement.  Many people find they are pulling up with the pelvic floor muscles and closing off instead of opening the anus. Due to lack pelvic floor relaxation and coordination the abdominal muscles, one has to work harder to push the feces out.  Many people have never been taught how to defecate efficiently and effectively.  Notice what happens to your body when you are having a bowel movement.  While you are sitting on the toilet pay attention to the following areas: . Jaw and mouth position . Angle of your hips   . Whether your feet touch the ground or not . Arm placement  . Spine position . Waist . Belly tension . Anus (opening of the anal canal)  An Evacuation/Defecation Plan   Here are the 4 basic points:  1. Lean forward enough for your elbows to rest on your knees 2. Support your feet on the floor or use a low stool if your feet don't touch the floor  3. Push out your belly as if you have swallowed a beach ball--you should feel a widening of your waist 4. Open and relax your pelvic floor  muscles, rather than tightening around the anus       The following conditions my require modifications to your toileting posture:  . If you have had surgery in the past that limits your back, hip, pelvic, knee or ankle flexibility . Constipation   Your healthcare practitioner may make the following additional suggestions and adjustments:  1) Sit on the toilet  a) Make sure your feet are supported. b) Notice your hip angle and spine position--most people find it effective to lean forward or raise their knees, which can help the muscles around the anus to relax  c) When you lean forward, place your forearms on your thighs for support  2) Relax suggestions a) Breath deeply in through your nose and out slowly through your mouth as if you are smelling the flowers and blowing out the candles. b) To become aware of how to relax your muscles, contracting and releasing muscles can be helpful.  Pull your pelvic floor muscles in tightly by using the image of holding back gas, or closing around the anus (visualize making a circle smaller) and lifting the anus up and in.  Then release the muscles and your anus should drop down and feel open. Repeat 5 times ending with the feeling of relaxation. c) Keep your pelvic floor muscles relaxed; let your belly bulge out. d) The digestive tract starts at the mouth and ends at the anal opening, so  be sure to relax both ends of the tube.  Place your tongue on the roof of your mouth with your teeth separated.  This helps relax your mouth and will help to relax the anus at the same time.  3) Empty (defecation) a) Keep your pelvic floor and sphincter relaxed, then bulge your anal muscles.  Make the anal opening wide.  b) Stick your belly out as if you have swallowed a beach ball. c) Make your belly wall hard using your belly muscles while continuing to breathe. Doing this makes it easier to open your anus. d) Breath out and give a grunt (or try using other sounds  such as ahhhh, shhhhh, ohhhh or grrrrrrr).  4) Finish a) As you finish your bowel movement, pull the pelvic floor muscles up and in.  This will leave your anus in the proper place rather than remaining pushed out and down. If you leave your anus pushed out and down, it will start to feel as though that is normal and give you incorrect signals about needing to have a bowel movement.

## 2021-01-24 NOTE — Therapy (Signed)
[facility] [address], [place], [place], [address] Phone: [phone]   Fax:  [phone]  Physical Therapy Evaluation  Patient Details  Name: Katrina Walters MRN: [id number] Date of Birth: 13-Aug-1956 Referring Provider (PT): Lavena Bullion, [place]   Encounter Date: 01/24/2021   PT End of Session - 01/24/21 1516    Visit Number 1    Date for PT Re-Evaluation 04/18/21    Authorization Type UHC    PT Start Time 1402    PT Stop Time 4403    PT Time Calculation (min) 40 min    Activity Tolerance Patient tolerated treatment well    Behavior During Therapy [facility] for tasks assessed/performed           Past Medical History:  Diagnosis Date  . Eating disorder   . Genital warts   . Hyperlipidemia   . 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 ABDOMINAL HYSTERECTOMY  08/2019  . ANAL RECTAL MANOMETRY N/A 11/12/2020   Procedure: ANO RECTAL MANOMETRY;  Surgeon: Lavena Bullion, DO;  Location: [facility];  Service: Gastroenterology;  Laterality: N/A;  . BLADDER SURGERY  08/2019  . BREAST REDUCTION SURGERY  2006  . CARDIAC CATHETERIZATION N/A 07/17/2016   Procedure: Left Heart Cath and Coronary Angiography;  Surgeon: Burnell Blanks, MD;  Location: [place] [facility];  Service: Cardiovascular;  Laterality: N/A;  . COLONOSCOPY  2015  . POLYPECTOMY  2015    There were no vitals filed for this visit.    Subjective Assessment - 01/24/21 1407    Subjective Pt states she cannot go to the bathroom since bladder surgery.  Before that I would go every 2-3 days without laxatives.  I had to push and took me a while to go.    Pertinent History s/p bladder sling and hysterectomy Dec 2020    Patient Stated Goals be able to go to the bathroom without laxative    Currently in Pain? 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    Referring Provider (PT) Cirigliano, Vito V, DO    Prior Therapy No      Precautions   Precautions None      Restrictions   Weight Bearing Restrictions No      Balance Screen   Has the patient fallen in the past 6 months No      [place] residence    Living Arrangements Spouse/significant other      Prior Function   Level of Independence Independent    Vocation On disability      Cognition   Overall Cognitive Status Within Functional Limits for tasks assessed      Posture/Postural Control   Posture/Postural Control Postural limitations    Postural Limitations Rounded Shoulders;Forward head;Flexed trunk      ROM / Strength   AROM / PROM / Strength AROM;PROM;Strength      AROM   Overall AROM Comments lumbar flexion WFL increased in sitting      PROM   Overall PROM Comments hip ER Rt 40%; Lt 50%      Strength   Overall Strength Comments hip adduction 4+/5 everything else 5/5      Flexibility  Soft Tissue Assessment /Muscle Length yes    Hamstrings Rt 70%; Lt 65%      Palpation   Palpation comment posterior pelvic tilt; weak abdominal wall and tension in transverse, descending and sigmoid colon      Ambulation/Gait   Gait Pattern Trunk flexed                      Objective measurements completed on examination: See above findings.     Pelvic Floor Special Questions - 01/24/21 0001    Prior Pregnancies Yes    Number of Pregnancies 1    Number of Vaginal Deliveries 1    Currently Sexually Active Yes    Is this Painful Yes    Marinoff Scale discomfort that does not affect completion   uses estrogen cream   Urinary Leakage No    Urinary urgency No    Urinary frequency normal    Fecal incontinence No   constipation   Fluid intake 64 oz    Falling out feeling (prolapse) No    Skin Integrity Hemorroids    Pelvic Floor  Internal Exam pt identity confirmed and informed consent given to perform internal soft tissue assessement    Exam Type Rectal    Palpation tight sphincter and puborectalis; unable to relax after contracting; tight with attempt to expel finger; coccyx with increased flexion    Strength fair squeeze, definite lift    Strength # of reps 3   quick with minimal relax   Strength # of seconds 5    Tone high            [facility] Adult PT Treatment/Exercise - 01/24/21 0001      Self-Care   Self-Care Other Self-Care Comments    Other Self-Care Comments  toileting techniques and diaphragmatic breathing                       PT Long Term Goals - 01/24/21 1414      PT LONG TERM GOAL #1   Title Pt will be able have a BM every 2-3 days without laxative    Time 12    Period Weeks    Status New    Target Date 04/18/21      PT LONG TERM GOAL #2   Title Pt will feel like she can fully empty the bladder and bowel without strainging    Time 12    Period Weeks    Status New      PT LONG TERM GOAL #3   Title ind with advanced HEP    Time 12    Period Weeks    Status New    Target Date 04/18/21                  Plan - 01/24/21 1453    Clinical Impression Statement Pt presents to skilled PT clinic for difficulty with defacation and pelvic floor dysynergia.  Assessment is congruent with anorectal manometry and pt tightens sphincter muscles and has a hard time relaxing after contracting.  Tight coccygeus muscles are apparent with flexed coccyx bone.  Active/irritated hemorrhoid present.  Pt has fascial restrictions along transverse colon and descending/sigmoid colon.  Pt also has chronic low back pain and posture abnormalities as mentioned above.  She has tight h/s bil and hip ER restricted Lt>Rt.  She demonstrates core weakness and tight.    Personal Factors and Comorbidities Comorbidity 3+    Comorbidities s/p  hysterectomy, bladder sling, hystory of straining/constipation     Examination-Activity Limitations Toileting    Examination-Participation Restrictions Interpersonal Relationship    Stability/Clinical Decision Making Evolving/Moderate complexity    Clinical Decision Making Moderate    Rehab Potential Excellent    PT Frequency 1x / week    PT Duration 12 weeks    PT Treatment/Interventions ADLs/Self Care Home Management;Biofeedback;Electrical Stimulation;Cryotherapy;Moist Heat;Neuromuscular re-education;Therapeutic exercise;Therapeutic activities;Patient/family education;Manual techniques;Dry needling;Passive range of motion;Taping    PT Next Visit Plan abdominal fascial release, breathing and bulging, review toilet with tactile cues in sitting as needed    PT Home Exercise Plan toileting techniques    Consulted and Agree with Plan of Care Patient           Patient will benefit from skilled therapeutic intervention in order to improve the following deficits and impairments:  Pain,Postural dysfunction,Decreased strength,Impaired flexibility,Increased muscle spasms,Decreased endurance,Decreased coordination,Decreased range of motion,Increased fascial restricitons,Impaired tone  Visit Diagnosis: No diagnosis found.     Problem List Patient Active Problem List   Diagnosis Date Noted  . Dyssynergic defecation   . Constipation due to outlet dysfunction   . CAD (coronary artery disease)   . 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 SOB (shortness of breath) 12/06/2014  . Dyslipidemia, goal LDL below 70 12/04/2014  . Palpitations 12/04/2014  . Allergic rhinitis 12/04/2014  . Binge eating disorder 12/04/2014    Jule Ser, PT 01/24/2021, 3:30 PM  [facility] [address], [place], [place], [address] Phone: [phone]   Fax:  [phone]  Name: Katrina Walters MRN: [id number] Date of Birth: Aug 13, 1956

## 2021-01-29 ENCOUNTER — Other Ambulatory Visit: Payer: Self-pay

## 2021-01-29 ENCOUNTER — Ambulatory Visit: Payer: Medicare Other | Admitting: Physical Therapy

## 2021-01-29 ENCOUNTER — Encounter: Payer: Self-pay | Admitting: Physical Therapy

## 2021-01-29 DIAGNOSIS — M6281 Muscle weakness (generalized): Secondary | ICD-10-CM

## 2021-01-29 DIAGNOSIS — R279 Unspecified lack of coordination: Secondary | ICD-10-CM

## 2021-01-29 DIAGNOSIS — R293 Abnormal posture: Secondary | ICD-10-CM

## 2021-01-29 NOTE — Therapy (Signed)
[facility] [address], [place], [place], [address] Phone: [phone]   Fax:  [phone]  Physical Therapy Treatment  Patient Details  Name: Katrina Walters MRN: [id number] Date of Birth: [birth year] Referring Provider (PT): Lavena Bullion, [place]   Encounter Date: 01/29/2021   PT End of Session - 01/29/21 1316    Visit Number 2    Date for PT Re-Evaluation 04/18/21    Authorization Type UHC    PT Start Time 6767    PT Stop Time 1309    PT Time Calculation (min) 38 min    Activity Tolerance Patient tolerated treatment well    Behavior During Therapy [facility] for tasks assessed/performed           Past Medical History:  Diagnosis Date  . Eating disorder   . Genital warts   . Hyperlipidemia   . 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 ABDOMINAL HYSTERECTOMY  08/2019  . ANAL RECTAL MANOMETRY N/A 11/12/2020   Procedure: ANO RECTAL MANOMETRY;  Surgeon: Lavena Bullion, DO;  [facility];  Service: Gastroenterology;  Laterality: N/A;  . BLADDER SURGERY  08/2019  . BREAST REDUCTION SURGERY  2006  . CARDIAC CATHETERIZATION N/A 07/17/2016   Procedure: Left Heart Cath and Coronary Angiography;  Surgeon: Burnell Blanks, MD;  Location: [place] [facility];  Service: Cardiovascular;  Laterality: N/A;  . COLONOSCOPY  2015  . POLYPECTOMY  2015    There were no vitals filed for this visit.   Subjective Assessment - 01/29/21 1234    Subjective Pt states she went 3 times to the bathroom after the evaluation and just doing abdominal massage.  Then, she took laxative only one time since that time.    Patient Stated Goals be able to go to the bathroom without laxative    Currently in Pain? No/denies                             [facility] Adult PT Treatment/Exercise - 01/29/21 0001      Neuro Re-ed    Neuro Re-ed Details  posure education in sitting  with towel and pillow to support neutral spine; diaphragmatic breathing in sitting      Exercises   Exercises Lumbar      Lumbar Exercises: Stretches   Other Lumbar Stretch Exercise thoracic ext, pec stretch, thoracic rotation stretches performed and educated - added to HEP      Manual Therapy   Manual Therapy Myofascial release    Myofascial Release all parts of colon, stomach and liver releases                       PT Long Term Goals - 01/29/21 1317      PT LONG TERM GOAL #1   Title Pt will be able have a BM every 2-3 days without laxative    Baseline has had a day that was able to go without one    Status On-going      PT LONG TERM GOAL #2   Title Pt will feel like she can fully empty the bladder and bowel without strainging    Baseline wasn't having to strain this past few days    Status On-going      PT LONG TERM GOAL #3   Title ind with advanced HEP    Status On-going  Plan - 01/29/21 1318    Clinical Impression Statement Pt responded well to STM and moyfascial release.  Pt was able to understand and demo good posture with verbal and tactile cues.  Without cues she was significantly flexed foward.  Pt was given HEP stretches and breathing for improved posture and throcic mobility in order to reduce pressure in abdomen. Pt will benefit from skilled PT to continue to progress towards functional goals.    PT Treatment/Interventions ADLs/Self Care Home Management;Biofeedback;Electrical Stimulation;Cryotherapy;Moist Heat;Neuromuscular re-education;Therapeutic exercise;Therapeutic activities;Patient/family education;Manual techniques;Dry needling;Passive range of motion;Taping    PT Next Visit Plan review posture and breathing and bulging, h/s and hip stretches, basic core strength; ask about dyspareunia if needed education on dilators or self massage foam noodle    PT Home Exercise Plan toileting techniques    Consulted and Agree with Plan of  Care Patient           Patient will benefit from skilled therapeutic intervention in order to improve the following deficits and impairments:  Pain,Postural dysfunction,Decreased strength,Impaired flexibility,Increased muscle spasms,Decreased endurance,Decreased coordination,Decreased range of motion,Increased fascial restricitons,Impaired tone  Visit Diagnosis: Unspecified lack of coordination  Muscle weakness (generalized)  Abnormal posture     Problem List Patient Active Problem List   Diagnosis Date Noted  . Dyssynergic defecation   . Constipation due to outlet dysfunction   . CAD (coronary artery disease)   . 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 SOB (shortness of breath) 12/06/2014  . Dyslipidemia, goal LDL below 70 12/04/2014  . Palpitations 12/04/2014  . Allergic rhinitis 12/04/2014  . Binge eating disorder 12/04/2014    Jule Ser, PT 01/29/2021, 2:41 PM  [facility] [address] [place], [place], [address] Phone: [phone]   Fax:  [phone]  Name: Katrina Walters MRN: [id number] Date of Birth: 1955-10-27

## 2021-02-01 ENCOUNTER — Telehealth: Payer: Self-pay | Admitting: Medical

## 2021-02-01 ENCOUNTER — Other Ambulatory Visit: Payer: Self-pay

## 2021-02-01 ENCOUNTER — Ambulatory Visit (INDEPENDENT_AMBULATORY_CARE_PROVIDER_SITE_OTHER): Payer: Medicare Other | Admitting: Medical

## 2021-02-01 ENCOUNTER — Encounter: Payer: Self-pay | Admitting: Medical

## 2021-02-01 VITALS — BP 112/71 | HR 71 | Temp 98.5°F | Ht 63.0 in | Wt 112.4 lb

## 2021-02-01 DIAGNOSIS — E785 Hyperlipidemia, unspecified: Secondary | ICD-10-CM

## 2021-02-01 DIAGNOSIS — K219 Gastro-esophageal reflux disease without esophagitis: Secondary | ICD-10-CM

## 2021-02-01 DIAGNOSIS — A239 Brucellosis, unspecified: Secondary | ICD-10-CM

## 2021-02-01 DIAGNOSIS — I25119 Atherosclerotic heart disease of native coronary artery with unspecified angina pectoris: Secondary | ICD-10-CM

## 2021-02-01 DIAGNOSIS — M810 Age-related osteoporosis without current pathological fracture: Secondary | ICD-10-CM

## 2021-02-01 LAB — COMPREHENSIVE METABOLIC PANEL
ALT: 11 U/L (ref 0–35)
AST: 15 U/L (ref 0–37)
Albumin: 4.4 g/dL (ref 3.5–5.2)
Alkaline Phosphatase: 70 U/L (ref 39–117)
BUN: 13 mg/dL (ref 6–23)
CO2: 29 mEq/L (ref 19–32)
Calcium: 9.4 mg/dL (ref 8.4–10.5)
Chloride: 103 mEq/L (ref 96–112)
Creatinine, Ser: 0.76 mg/dL (ref 0.40–1.20)
GFR: 83.08 mL/min (ref >60.00)
Glucose, Bld: 76 mg/dL (ref 70–99)
Potassium: 5.5 mEq/L — ABNORMAL HIGH (ref 3.5–5.1)
Sodium: 138 mEq/L (ref 135–145)
Total Bilirubin: 0.5 mg/dL (ref 0.2–1.2)
Total Protein: 7 g/dL (ref 6.0–8.3)

## 2021-02-01 LAB — LIPID PANEL
Cholesterol: 132 mg/dL (ref 0–200)
HDL: 40.7 mg/dL (ref >39.00)
LDL Cholesterol: 76 mg/dL (ref 0–99)
NonHDL: 91.49
Total CHOL/HDL Ratio: 3
Triglycerides: 77 mg/dL (ref 0.0–149.0)
VLDL: 15.4 mg/dL (ref 0.0–40.0)

## 2021-02-01 NOTE — Telephone Encounter (Signed)
Pt has osteoporosis. She can't tolerate fosomax/oral meds due to GI side effects. Can we see is insurance would authorize prolia?

## 2021-02-01 NOTE — Progress Notes (Signed)
Subjective:    Patient ID: Katrina Walters, female    DOB: 03/19/1956, 65 y.o.   MRN: [id number]  HPI   Pt in for follow up.  Pt states he has history of osteoporosis.    ASSESSMENT: The BMD measured at AP Spine L1-L4 is 0.793 g/cm2 with a T-score of -3.2.  This patient is considered OSTEOPOROTIC according to [facility]) criteria.  The scan quality is good.  Site Region Measured Date Measured Age WHO YA BMD Classification T-score  AP Spine L1-L4 10/03/2020 63.6 Osteoporosis -3.2 0.793 g/cm2 AP Spine L1-L4 09/22/2018 61.5 Osteoporosis -3.8 0.721 g/cm2 AP Spine L1-L4 05/16/2016 59.2 Osteoporosis -3.7 0.735 g/cm2  DualFemur Total Mean 10/03/2020 63.6 Osteopenia -2.2 0.726 g/cm2 DualFemur Total Mean 09/22/2018 61.5 Osteopenia -1.8 0.786 g/cm2 DualFemur Total Mean 05/16/2016 59.2 Osteopenia -1.8 0.779 g/cm2  Left Forearm Radius 33% 10/03/2020 63.6 Osteopenia -1.5 0.741 g/cm2 Left Forearm Radius 33% 09/22/2018 61.5 Osteopenia -1.7 0.724 g/cm2  World Health Organization [facility]) criteria for post-menopausal, Caucasian Women: Normal       T-score at or above -1 SD Osteopenia   T-score between -1 and -2.5 SD Osteoporosis T-score at or below -2.5 SD  RECOMMENDATION: 1. All patients should optimize calcium and vitamin D intake. 2. Consider FDA-approved medical therapies in postmenopausal women and men aged 73 years and older, based on the following: a. A hip or vertebral(clinical or morphometric) fracture. b. T-score < -2.5 at the femoral neck or spine after appropriate evaluation to exclude secondary causes c. Low bone mass (T-score between -1.0 and -2.5 at the femoral neck or spine) and a 10 year probability of a hip fracture >3% or a 10 year probability of major osteoporosis-related fracture > 20% based on the [place]-adapted WHO algorithm d. Clinical judgement and/or patient preferences may indicate treatment for people with 10-year fracture probabilities  above or below these levels  Pt states she in past could not tolerated fosamax weelkly. She had gi side effects. Pt wants to use prolia.  Pt has some dystrophic nails/ probable fungal infection. I rx'd lamisil. Pharmacy never filled. She states went to wrong pharmacy.  Pt has gerd. Symptoms controlled with omeprazole.  High cholesterol. Pt used to be on repatha. Pt taking crestor one tab every 3 days. Pt considering newer injection medication. Pt had to stop repatha due to itching.   Hx of ibs. Pt is on linzess.     Review of Systems  Constitutional: Negative for chills, fatigue and fever.  Respiratory: Negative for cough, chest tightness, shortness of breath and wheezing.   Cardiovascular: Negative for chest pain and palpitations.  Gastrointestinal: Negative for abdominal pain, blood in stool, nausea and vomiting.  Genitourinary: Negative for dysuria.  Musculoskeletal: Negative for back pain, joint swelling and neck stiffness.  Skin: Negative for rash.  Neurological: Negative for dizziness, speech difficulty and weakness.  Hematological: Negative for adenopathy. Does not bruise/bleed easily.  Psychiatric/Behavioral: Negative for behavioral problems, confusion and sleep disturbance. The patient is not nervous/anxious.     Past Medical History:  Diagnosis Date  . Eating disorder   . Genital warts   . Hyperlipidemia   . 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 Marital status: Married    Spouse name: Not on file  . Number of children: Not on file  . Years of education: Not on file  . Highest education level: Not on file  Occupational History  . Occupation: unemployed at the time  Tobacco Use  . Smoking status: Never Smoker  . Smokeless tobacco: Never Used  Vaping Use  . Vaping Use: Never used  Substance and Sexual Activity  . Alcohol use: Not Currently    Alcohol/week: 0.0 standard drinks    Comment: occassionally  . Drug use: No   . Sexual activity: Not on file  Other Topics Concern  . Not on file  Social History Narrative  . Not on file   Social Determinants of Health   Financial Resource Strain: Not on file  Food Insecurity: Not on file  Transportation Needs: Not on file  Physical Activity: Not on file  Stress: Not on file  Social Connections: Not on file  Intimate Partner Violence: Not on file    Past Surgical History:  Procedure Laterality Date  . ABDOMINAL HYSTERECTOMY  08/2019  . ANAL RECTAL MANOMETRY N/A 11/12/2020   Procedure: ANO RECTAL MANOMETRY;  Surgeon: Lavena Bullion, DO;  [facility];  Service: Gastroenterology;  Laterality: N/A;  . BLADDER SURGERY  08/2019  . BREAST REDUCTION SURGERY  2006  . CARDIAC CATHETERIZATION N/A 07/17/2016   Procedure: Left Heart Cath and Coronary Angiography;  Surgeon: Burnell Blanks, MD;  Location: [place] [facility];  Service: Cardiovascular;  Laterality: N/A;  . COLONOSCOPY  2015  . POLYPECTOMY  2015    Family History  Problem Relation Age of Onset  . Heart disease Mother        died at 67 of heart attack  . Hypertension Mother   . Heart attack Mother   . Heart disease Father        had heart attack at age 21  . Heart attack Father   . Rectal cancer Father 1  . Cancer Paternal Aunt   . Stomach cancer Paternal Grandmother   . Colon cancer Neg Hx   . Colon polyps Neg Hx   . Esophageal cancer Neg Hx     Allergies  Allergen Reactions  . Amoxicillin     Had fever with flu like symptoms. Has patient had a PCN reaction causing immediate rash, facial/tongue/throat swelling, SOB or lightheadedness with hypotension: no Has patient had a PCN reaction causing severe rash involving mucus membranes or skin necrosis: no Has patient had a PCN reaction that required hospitalization no Has patient had a PCN reaction occurring within the last 10 years: yes If all of the above answers are "NO", then may proceed with Cephalosporin use.  [name]  Clopidogrel & Aspirin Itching  . Atorvastatin     Myalgia  . Crestor [Rosuvastatin]     Hair loss  . Metoprolol Other (See Comments)    itching  . Pravastatin     itching    Current Outpatient Medications on File Prior to Visit  Medication Sig Dispense Refill  . aspirin EC 81 MG tablet Take 81 mg by mouth daily.    . bisacodyl ([employer] C-LAX LAXATIVE) 5 MG EC tablet Take 10-15 mg by mouth daily as needed for moderate constipation (Take 2 tablet one day and 3 tablets the next. Alternate days).    . colchicine 0.6 MG tablet Take 1 tablet (0.6 mg total) by mouth 2 (two) times daily. 180 tablet 3  . diclofenac (VOLTAREN) 75 MG EC tablet Take 1 tablet (75 mg total) by mouth 2 (two) times daily as needed. 60 tablet 0  . diclofenac sodium (VOLTAREN) 1 % GEL Apply topically 4 (four) times daily.    [name] linaclotide (LINZESS) [place]  capsule Take 1 capsule (290 mcg total) by mouth daily before breakfast. 90 capsule 5  . Misc Natural Products (COMPLETE MENOPAUSE HEALTH PO) Take 1 Dose by mouth daily.     . NON FORMULARY daily. CBD oil - for pain    . omeprazole (PRILOSEC) 40 MG capsule TAKE 1 CAPSULE BY MOUTH EVERY DAY 90 capsule 1  . PREMARIN vaginal cream Place 4.98 Applicatorfuls vaginally 2 (two) times a week. 30 g 0  . rosuvastatin (CRESTOR) 10 MG tablet Take 10 mg by mouth every other day.     . terbinafine (LAMISIL) 250 MG tablet Take 1 tablet (250 mg total) by mouth daily. (Patient not taking: Reported on 10/11/2020) 90 tablet 0   No current facility-administered medications on file prior to visit.    BP 112/71   Pulse 71   Temp 98.5 F (36.9 C)   Ht 5\' 3"  (1.6 m)   Wt 112 lb 6.4 oz (51 kg)   LMP  (LMP Unknown)   SpO2 100%   BMI 19.91 kg/m       Objective:   Physical Exam  General Mental Status- Alert. General Appearance- Not in acute distress.   Skin General: Color- Normal Color. Moisture- Normal Moisture.  Neck Carotid Arteries- Normal color. Moisture- Normal Moisture.  No carotid bruits. No JVD.  Chest and Lung Exam Auscultation: Breath Sounds:-Normal.  Cardiovascular Auscultation:Rythm- Regular. Murmurs & Other Heart Sounds:Auscultation of the heart reveals- No Murmurs.  Abdomen Inspection:-Inspeection Normal. Palpation/Percussion:Note:No mass. Palpation and Percussion of the abdomen reveal- Non Tender, Non Distended + BS, no rebound or guarding.   Neurologic Cranial Nerve exam:- CN III-XII intact(No nystagmus), symmetric smile. Strength:- 5/5 equal and symmetric strength both upper and lower extremities.  Feet- nails all normal but 4th toe mild dystrophic.    Assessment & Plan:  History of coronary artery disease and hyperlipidemia.  Continue Crestor presently and I do think trying the new upcoming medication likely a good idea.  Avoid referral to cardiologist for ultimate decision regarding that new medication.  History of GERD well-controlled with omeprazole.  History of IBS currently stable/controlled with Linzess.  History of Mediterranean fever.  Continue colchicine.  History of osteoporosis and unable to tolerate oral bisphosphonate.  Sending message to [place] assistant to see if Prolia covered under your insurance.  Please get CMP and lipid panel today.  Right fourth toe dystrophic nail.  Other nails look good.  Possible fungal infection but benefits versus risks discussed regarding Lamisil and decided not to prescribe.  Follow-up in 3 months or as needed.

## 2021-02-01 NOTE — Telephone Encounter (Signed)
Clinical info submitted to insurance company. Waiting on summary of benefits to determine coverage. Will call patient to discuss once received.  

## 2021-02-01 NOTE — Patient Instructions (Signed)
History of coronary artery disease and hyperlipidemia.  Continue Crestor presently and I do think trying the new upcoming medication likely a good idea.  Avoid referral to cardiologist for ultimate decision regarding that new medication.  History of GERD well-controlled with omeprazole.  History of IBS currently stable/controlled with Linzess.  History of Mediterranean fever.  Continue colchicine.  History of osteoporosis and unable to tolerate oral bisphosphonate.  Sending message to [place] assistant to see if Prolia covered under your insurance.  Please get CMP and lipid panel today.  Right fourth toe dystrophic nail.  Other nails look good.  Possible fungal infection but benefits versus risks discussed regarding Lamisil and decided not to prescribe.  Follow-up in 3 months or as needed.

## 2021-02-02 ENCOUNTER — Telehealth: Payer: Self-pay | Admitting: Medical

## 2021-02-02 MED ORDER — SODIUM POLYSTYRENE SULFONATE 15 GM/60ML PO SUSP: ORAL | 0 refills | Status: DC

## 2021-02-02 NOTE — Telephone Encounter (Signed)
Rx kayexalate sent to pt pharmacy.

## 2021-02-11 ENCOUNTER — Other Ambulatory Visit: Payer: Self-pay

## 2021-02-11 ENCOUNTER — Ambulatory Visit: Payer: Medicare Other | Attending: Gastroenterology | Admitting: Physical Therapy

## 2021-02-11 ENCOUNTER — Other Ambulatory Visit (INDEPENDENT_AMBULATORY_CARE_PROVIDER_SITE_OTHER): Payer: Medicare Other

## 2021-02-11 DIAGNOSIS — R293 Abnormal posture: Secondary | ICD-10-CM | POA: Diagnosis present

## 2021-02-11 DIAGNOSIS — E875 Hyperkalemia: Secondary | ICD-10-CM | POA: Diagnosis not present

## 2021-02-11 DIAGNOSIS — R279 Unspecified lack of coordination: Secondary | ICD-10-CM

## 2021-02-11 DIAGNOSIS — M6281 Muscle weakness (generalized): Secondary | ICD-10-CM | POA: Diagnosis present

## 2021-02-11 NOTE — Therapy (Signed)
[facility] [address], [place], [place], [address] Phone: [phone]   Fax:  [phone]  Physical Therapy Treatment  Patient Details  Name: Katrina Walters MRN: [id number] Date of Birth: [birth year] Referring Provider (PT): Lavena Bullion, [place]   Encounter Date: 02/11/2021   PT End of Session - 02/11/21 1446    Visit Number 3    Date for PT Re-Evaluation 04/18/21    Authorization Type UHC    PT Start Time 2751    PT Stop Time 1521    PT Time Calculation (min) 35 min    Activity Tolerance Patient tolerated treatment well    Behavior During Therapy [facility] for tasks assessed/performed           Past Medical History:  Diagnosis Date  . Eating disorder   . Genital warts   . Hyperlipidemia   . 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 ABDOMINAL HYSTERECTOMY  08/2019  . ANAL RECTAL MANOMETRY N/A 11/12/2020   Procedure: ANO RECTAL MANOMETRY;  Surgeon: Lavena Bullion, DO;  Location: [facility];  Service: Gastroenterology;  Laterality: N/A;  . BLADDER SURGERY  08/2019  . BREAST REDUCTION SURGERY  2006  . CARDIAC CATHETERIZATION N/A 07/17/2016   Procedure: Left Heart Cath and Coronary Angiography;  Surgeon: Burnell Blanks, MD;  Location: [facility];  Service: Cardiovascular;  Laterality: N/A;  . COLONOSCOPY  2015  . POLYPECTOMY  2015    There were no vitals filed for this visit.   Subjective Assessment - 02/11/21 1448    Subjective Pt states she took a laxative 1x last week.  I still do the massages on my abdomen.    Patient Stated Goals be able to go to the bathroom without laxative    Currently in Pain? No/denies                             [facility] Adult PT Treatment/Exercise - 02/11/21 0001      Lumbar Exercises: Stretches   Other Lumbar Stretch Exercise thoracic ext, pec stretch, thoracic rotation stretches performed and  educated - added to HEP    Other Lumbar Stretch Exercise h/s and piriformis  stretch      Lumbar Exercises: Quadruped   Madcat/Old Horse 5 reps      Manual Therapy   Myofascial Release all parts of colon                  PT Education - 02/11/21 1519    Education Details Access Code: [email or web address]    Person(s) Educated Patient    Methods Explanation;Demonstration;Handout;Verbal cues    Comprehension Verbalized understanding;Returned demonstration               PT Long Term Goals - 01/29/21 1317      PT LONG TERM GOAL #1   Title Pt will be able have a BM every 2-3 days without laxative    Baseline has had a day that was able to go without one    Status On-going      PT LONG TERM GOAL #2   Title Pt will feel like she can fully empty the bladder and bowel without strainging    Baseline wasn't having to strain this past few days    Status On-going      PT LONG TERM GOAL #3   Title ind with advanced  HEP    Status On-going                 Plan - 02/11/21 1531    Clinical Impression Statement Pt did well with stretches added today.  Pt is overall much better.  Needs cues for posture and during exericses for correct spine movements.  Pt will benefit from skilled PT to continue to address posture strength and pelvic floor coordination.    PT Treatment/Interventions ADLs/Self Care Home Management;Biofeedback;Electrical Stimulation;Cryotherapy;Moist Heat;Neuromuscular re-education;Therapeutic exercise;Therapeutic activities;Patient/family education;Manual techniques;Dry needling;Passive range of motion;Taping    PT Next Visit Plan review stretches as needed; posture strengthening    PT Home Exercise Plan Access Code: [email or web address]           Patient will benefit from skilled therapeutic intervention in order to improve the following deficits and impairments:  Pain,Postural dysfunction,Decreased strength,Impaired flexibility,Increased muscle spasms,Decreased  endurance,Decreased coordination,Decreased range of motion,Increased fascial restricitons,Impaired tone  Visit Diagnosis: Unspecified lack of coordination  Muscle weakness (generalized)  Abnormal posture     Problem List Patient Active Problem List   Diagnosis Date Noted  . Dyssynergic defecation   . Constipation due to outlet dysfunction   . CAD (coronary artery disease)   . 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 SOB (shortness of breath) 12/06/2014  . Dyslipidemia, goal LDL below 70 12/04/2014  . Palpitations 12/04/2014  . Allergic rhinitis 12/04/2014  . Binge eating disorder 12/04/2014    Jule Ser, PT 02/11/2021, 3:46 PM  [facility] [address], [place], [place], [address] Phone: [phone]   Fax:  [phone]  Name: Katrina Walters MRN: [id number] Date of Birth: November 21, 1955

## 2021-02-11 NOTE — Patient Instructions (Signed)
Access Code: [email or web address] URL: [email or web address] Date: 02/11/2021 Prepared by: Jari Favre  Exercises Thoracic Extension Mobilization with Noodle - 1 x daily - 7 x weekly - 3 sets - 10 reps Seated Hamstring Stretch - 1 x daily - 7 x weekly - 1 sets - 3 reps - 30 sec hold Seated Figure 4 Piriformis Stretch - 1 x daily - 7 x weekly - 1 sets - 3 reps - 30 hold Open Book Chest Rotation Stretch on Foam 1/2 Roll - 1 x daily - 7 x weekly - 1 sets - 5 reps - 20 hold Sidelying Thoracic Rotation with Open Book - 1 x daily - 7 x weekly - 1 sets - 5 reps - 10 sec hold Cat-Camel to Child's Pose - 1 x daily - 7 x weekly - 1 sets - 5 reps - 10 sec hold

## 2021-02-12 ENCOUNTER — Telehealth: Payer: Self-pay | Admitting: Medical

## 2021-02-12 DIAGNOSIS — E876 Hypokalemia: Secondary | ICD-10-CM

## 2021-02-12 LAB — BASIC METABOLIC PANEL
BUN: 8 mg/dL (ref 6–23)
CO2: 31 mEq/L (ref 19–32)
Calcium: 9 mg/dL (ref 8.4–10.5)
Chloride: 97 mEq/L (ref 96–112)
Creatinine, Ser: 0.75 mg/dL (ref 0.40–1.20)
GFR: 84.39 mL/min (ref >60.00)
Glucose, Bld: 86 mg/dL (ref 70–99)
Potassium: 2.9 mEq/L — ABNORMAL LOW (ref 3.5–5.1)
Sodium: 139 mEq/L (ref 135–145)

## 2021-02-12 MED ORDER — POTASSIUM CHLORIDE CRYS ER 20 MEQ PO TBCR: 20.0000 meq | EXTENDED_RELEASE_TABLET | Freq: Two times a day (BID) | ORAL | 0 refills | Status: DC

## 2021-02-12 NOTE — Telephone Encounter (Signed)
Future cmp placed. 

## 2021-02-18 ENCOUNTER — Telehealth: Payer: Self-pay | Admitting: Medical

## 2021-02-18 ENCOUNTER — Other Ambulatory Visit (INDEPENDENT_AMBULATORY_CARE_PROVIDER_SITE_OTHER): Payer: Medicare Other

## 2021-02-18 ENCOUNTER — Other Ambulatory Visit: Payer: Self-pay

## 2021-02-18 DIAGNOSIS — E876 Hypokalemia: Secondary | ICD-10-CM | POA: Diagnosis not present

## 2021-02-18 DIAGNOSIS — E875 Hyperkalemia: Secondary | ICD-10-CM

## 2021-02-18 LAB — COMPREHENSIVE METABOLIC PANEL
ALT: 10 U/L (ref 0–35)
AST: 13 U/L (ref 0–37)
Albumin: 4.5 g/dL (ref 3.5–5.2)
Alkaline Phosphatase: 69 U/L (ref 39–117)
BUN: 10 mg/dL (ref 6–23)
CO2: 29 mEq/L (ref 19–32)
Calcium: 10.3 mg/dL (ref 8.4–10.5)
Chloride: 99 mEq/L (ref 96–112)
Creatinine, Ser: 0.8 mg/dL (ref 0.40–1.20)
GFR: 78.1 mL/min (ref >60.00)
Glucose, Bld: 78 mg/dL (ref 70–99)
Potassium: 5.2 mEq/L — ABNORMAL HIGH (ref 3.5–5.1)
Sodium: 137 mEq/L (ref 135–145)
Total Bilirubin: 0.7 mg/dL (ref 0.2–1.2)
Total Protein: 7.6 g/dL (ref 6.0–8.3)

## 2021-02-18 NOTE — Telephone Encounter (Signed)
Future lab placed  

## 2021-02-21 ENCOUNTER — Telehealth: Payer: Self-pay | Admitting: Medical

## 2021-02-21 NOTE — Telephone Encounter (Signed)
Pt, called her baby cat bite her on her right thumb patient states she had her tetanus shot in 2014 she was wondering can she get an antibiotics prescription  sent to her pharmacy   Please advice

## 2021-02-25 NOTE — Telephone Encounter (Signed)
Patient states her cat bite is doing fine , doesn't want antibiotics anymore

## 2021-03-04 ENCOUNTER — Other Ambulatory Visit: Payer: Self-pay

## 2021-03-04 ENCOUNTER — Ambulatory Visit: Payer: Medicare Other | Admitting: Physical Therapy

## 2021-03-04 ENCOUNTER — Encounter: Payer: Self-pay | Admitting: Physical Therapy

## 2021-03-04 DIAGNOSIS — M6281 Muscle weakness (generalized): Secondary | ICD-10-CM

## 2021-03-04 DIAGNOSIS — R293 Abnormal posture: Secondary | ICD-10-CM

## 2021-03-04 DIAGNOSIS — R279 Unspecified lack of coordination: Secondary | ICD-10-CM

## 2021-03-04 NOTE — Therapy (Signed)
[facility] [address] [place], [place], [address] Phone: [phone]   Fax:  [phone]  Physical Therapy Treatment  Patient Details  Name: Katrina Walters MRN: [id number] Date of Birth: 06-Feb-1956 Referring Provider (PT): Lavena Bullion, [place]   Encounter Date: 03/04/2021   PT End of Session - 03/04/21 1214     Visit Number 4    Date for PT Re-Evaluation 04/18/21    Authorization Type UHC    PT Start Time 1214    PT Stop Time 1300    PT Time Calculation (min) 46 min    Activity Tolerance Patient tolerated treatment well    Behavior During Therapy [facility] for tasks assessed/performed             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    Past Surgical History:  Procedure Laterality Date   ABDOMINAL HYSTERECTOMY  08/2019   ANAL RECTAL MANOMETRY N/A 11/12/2020   Procedure: ANO RECTAL MANOMETRY;  Surgeon: Lavena Bullion, DO;  Location: [facility];  Service: Gastroenterology;  Laterality: N/A;   BLADDER SURGERY  08/2019   BREAST REDUCTION SURGERY  2006   CARDIAC CATHETERIZATION N/A 07/17/2016   Procedure: Left Heart Cath and Coronary Angiography;  Surgeon: Burnell Blanks, MD;  Location: [facility];  Service: Cardiovascular;  Laterality: N/A;   COLONOSCOPY  2015   POLYPECTOMY  2015    There were no vitals filed for this visit.   Subjective Assessment - 03/04/21 1253     Subjective I had a BM almost every day or every other day.  One time it was 3 days so I took a laxative.  The stretches are helping    Patient Stated Goals be able to go to the bathroom without laxative    Currently in Pain? No/denies                               [facility] Adult PT Treatment/Exercise - 03/04/21 0001       Lumbar Exercises: Stretches   Other Lumbar Stretch Exercise hip flexor stretch in half  kneeling      Manual Therapy   Myofascial Release all parts of colon                         PT Long Term Goals - 03/04/21 1217       PT LONG TERM GOAL #1   Title Pt will be able have a BM every 2-3 days without laxative    Baseline going every other day, took a laxative one time    Status Achieved      PT LONG TERM GOAL #2   Title Pt will feel like she can fully empty the bladder and bowel without strainging    Baseline I don't strain for the most part    Status Achieved      PT LONG TERM GOAL #3   Title ind with advanced HEP    Status Achieved                   Plan - 03/04/21 1246     Clinical Impression Statement Pt has done well with HEP independently and has met all goals today.  She had minimal fascial restrictions and is expected to  be able to manage symptoms on her own.  Pt is recommended to d/c with HEP today.    PT Treatment/Interventions ADLs/Self Care Home Management;Biofeedback;Electrical Stimulation;Cryotherapy;Moist Heat;Neuromuscular re-education;Therapeutic exercise;Therapeutic activities;Patient/family education;Manual techniques;Dry needling;Passive range of motion;Taping    PT Next Visit Plan d/c today    PT Home Exercise Plan Access Code: [email or web address]    Consulted and Agree with Plan of Care Patient             Patient will benefit from skilled therapeutic intervention in order to improve the following deficits and impairments:  Pain, Postural dysfunction, Decreased strength, Impaired flexibility, Increased muscle spasms, Decreased endurance, Decreased coordination, Decreased range of motion, Increased fascial restricitons, Impaired tone  Visit Diagnosis: Unspecified lack of coordination  Muscle weakness (generalized)  Abnormal posture     Problem List Patient Active Problem List   Diagnosis Date Noted   Dyssynergic defecation    Constipation due to outlet dysfunction    CAD (coronary artery disease)    Mediterranean  fever 06/13/2016   SOB (shortness of breath) 12/06/2014   Dyslipidemia, goal LDL below 70 12/04/2014   Palpitations 12/04/2014   Allergic rhinitis 12/04/2014   Binge eating disorder 12/04/2014    Camillo Flaming Denice Cardon, PT 03/04/2021, 2:03 PM  [facility] [address] [place], [place], [address] Phone: [phone]   Fax:  [phone]  Name: Katrina Walters MRN: [id number] Date of Birth: 1956-04-24   PHYSICAL THERAPY DISCHARGE SUMMARY  Visits from Start of Care: 4  Current functional level related to goals / functional outcomes: See above goals met   Remaining deficits: See above details   Education / Equipment: HEP   Patient agrees to discharge. Patient goals were met. Patient is being discharged due to meeting the stated rehab goals.  Gustavus Bryant, PT 03/04/21 2:38 PM

## 2021-03-12 ENCOUNTER — Encounter: Payer: Medicare Other | Admitting: Physical Therapy

## 2021-03-12 ENCOUNTER — Other Ambulatory Visit: Payer: Self-pay

## 2021-03-12 ENCOUNTER — Telehealth: Payer: Self-pay | Admitting: Medical

## 2021-03-12 ENCOUNTER — Other Ambulatory Visit (INDEPENDENT_AMBULATORY_CARE_PROVIDER_SITE_OTHER): Payer: Medicare Other

## 2021-03-12 DIAGNOSIS — E875 Hyperkalemia: Secondary | ICD-10-CM | POA: Diagnosis not present

## 2021-03-12 LAB — COMPREHENSIVE METABOLIC PANEL
ALT: 10 U/L (ref 0–35)
AST: 15 U/L (ref 0–37)
Albumin: 4.4 g/dL (ref 3.5–5.2)
Alkaline Phosphatase: 71 U/L (ref 39–117)
BUN: 16 mg/dL (ref 6–23)
CO2: 30 mEq/L (ref 19–32)
Calcium: 9.9 mg/dL (ref 8.4–10.5)
Chloride: 101 mEq/L (ref 96–112)
Creatinine, Ser: 0.83 mg/dL (ref 0.40–1.20)
GFR: 74.69 mL/min (ref >60.00)
Glucose, Bld: 53 mg/dL — ABNORMAL LOW (ref 70–99)
Potassium: 4.4 mEq/L (ref 3.5–5.1)
Sodium: 138 mEq/L (ref 135–145)
Total Bilirubin: 0.6 mg/dL (ref 0.2–1.2)
Total Protein: 7.1 g/dL (ref 6.0–8.3)

## 2021-03-12 NOTE — Telephone Encounter (Signed)
Ok to schedule patient for prolia shot. Nurse visit.

## 2021-03-12 NOTE — Telephone Encounter (Signed)
Patient wants to know if her prolia was approved.

## 2021-03-13 NOTE — Telephone Encounter (Signed)
Appt schedule with pt

## 2021-03-15 ENCOUNTER — Ambulatory Visit (INDEPENDENT_AMBULATORY_CARE_PROVIDER_SITE_OTHER): Payer: Medicare Other

## 2021-03-15 ENCOUNTER — Other Ambulatory Visit: Payer: Self-pay

## 2021-03-15 DIAGNOSIS — M810 Age-related osteoporosis without current pathological fracture: Secondary | ICD-10-CM

## 2021-03-15 MED ORDER — DENOSUMAB 60 MG/ML ~~LOC~~ SOSY
60.0000 mg | PREFILLED_SYRINGE | Freq: Once | SUBCUTANEOUS | Status: AC
  Administered 2021-03-15: 60 mg via SUBCUTANEOUS

## 2021-03-15 NOTE — Progress Notes (Addendum)
Katrina Walters is a 65 y.o. female presents to the office today for prolia injection per physician's orders. This is her first dose.  denosuman (med), 60mg /67mL (dose),  SQ (route) was administered left arm (location) today. Patient tolerated injection. Patient due for follow up labs/provider appt: No. Date due: 08/2021, will need bmp Patient next injection due: 6 months and one day (09/16/2020) appt made No Patient advised to call for scheduling prior to coming in.   Johnney Killian, PA-C

## 2021-03-18 ENCOUNTER — Encounter: Payer: Medicare Other | Admitting: Physical Therapy

## 2021-03-25 ENCOUNTER — Encounter: Payer: Medicare Other | Admitting: Physical Therapy

## 2021-04-01 ENCOUNTER — Encounter: Payer: Medicare Other | Admitting: Physical Therapy

## 2021-04-14 NOTE — Progress Notes (Signed)
Cardiology Office Note:   Date:  04/15/2021  NAME:  Katrina Walters    MRN: [id number] DOB:  [birth year]   PCP:  Mackie Pai, PA-C  Cardiologist:  Skeet Latch, MD  Electrophysiologist:  None   Referring MD: Elise Benne   Chief Complaint  Patient presents with   Chest Pain   History of Present Illness:   Katrina Walters is a 65 y.o. female with a hx of 3-vessel CAD, HLD who presents for follow-up. Significant CAD in 2017 that was recommended to be managed medically vs CABG. she presents with her husband.  She is had several episodes of chest pain left arm pain over the last few months.  She reports last week she had a pain in her left chest with heavy exertion.  She actually was able to walk through it and the pain subsided.  She reports left arm pain while doing housekeeping this past week as well.  She reports she can maintain high level of activity walking up to 45 minutes with no major symptoms but does get episodes.  She has been intolerant of metoprolol as well as other antianginal agents.  She maintains a low body weight and very low blood pressure at baseline.  BP 115/62.  She has not been able to tolerate lipid-lowering agents.  She did not tolerate Repatha due to pruritus at the injection site.  She is taking Crestor 10 mg but this is every third day.  Her most recent lipid profile shows her LDL is 76.  This is not at goal.  She is interested in inclusirin.  Since she has Medicare she likely will qualify for this.  She is also worried about her symptoms.  She had significant blockages 5 years ago and has not been reevaluated.  EKG in office demonstrates sinus rhythm with no acute ischemic changes.  We discussed left heart catheterization she is willing to proceed.  Problem List CAD -LHC 07/2016 -LM 40% -mLAD 50% -D1 70% -OM1 40% -mRCA 50%, dRCA 100% -med management pursued  2. 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    Past Surgical History: Past Surgical History:  Procedure Laterality Date   ABDOMINAL HYSTERECTOMY  08/2019   ANAL RECTAL MANOMETRY N/A 11/12/2020   Procedure: ANO RECTAL MANOMETRY;  Surgeon: Lavena Bullion, DO;  Location: [facility];  Service: Gastroenterology;  Laterality: N/A;   BLADDER SURGERY  08/2019   BREAST REDUCTION SURGERY  2006   CARDIAC CATHETERIZATION N/A 07/17/2016   Procedure: Left Heart Cath and Coronary Angiography;  Surgeon: Burnell Blanks, MD;  Location: [facility];  Service: Cardiovascular;  Laterality: N/A;   COLONOSCOPY  2015   POLYPECTOMY  2015    Current Medications: Current Meds  Medication Sig   aspirin EC 81 MG tablet Take 81 mg by mouth daily.   bisacodyl ([employer] C-LAX LAXATIVE) 5 MG EC tablet Take 10-15 mg by mouth daily as needed for moderate constipation (Take 2 tablet one day and 3 tablets the next. Alternate days).   colchicine 0.6 MG tablet Take 1 tablet (0.6 mg total) by mouth 2 (two) times daily.   diclofenac (VOLTAREN) 75 MG EC tablet Take 1 tablet (75 mg total) by mouth 2 (two) times daily  as needed.   Misc Natural Products (COMPLETE MENOPAUSE HEALTH PO) Take 1 Dose by mouth daily.    nitroGLYCERIN (NITROSTAT) 0.4 MG SL tablet Place 1 tablet (0.4 mg total) under the tongue every 5 (five) minutes as needed for chest pain.   NON FORMULARY daily. CBD oil - for pain   omeprazole (PRILOSEC) 40 MG capsule TAKE 1 CAPSULE BY MOUTH EVERY DAY   potassium chloride SA (KLOR-CON) 20 MEQ tablet Take 1 tablet (20 mEq total) by mouth 2 (two) times daily.   PREMARIN vaginal cream Place [id number] Applicatorfuls vaginally 2 (two) times a week.   ranolazine (RANEXA) 500 MG 12 hr tablet Take 1 tablet (500 mg total) by mouth 2 (two) times daily.    rosuvastatin (CRESTOR) 10 MG tablet Take 10 mg by mouth every other day.    sodium polystyrene (KAYEXALATE) 15 GM/60ML suspension 60 ml po q day for 4 days.     Allergies:    Amoxicillin, Clopidogrel & aspirin, Atorvastatin, Crestor [rosuvastatin], Metoprolol, and Pravastatin   Social History: Social History   Socioeconomic History   Marital status: Married    Spouse name: Not on file   Number of children: Not on file   Years of education: Not on file   Highest education level: Not on file  Occupational History   Occupation: unemployed at the time  Tobacco Use   Smoking status: Never   Smokeless tobacco: Never  Vaping Use   Vaping Use: Never used  Substance and Sexual Activity   Alcohol use: Not Currently    Alcohol/week: 0.0 standard drinks    Comment: occassionally   Drug use: No   Sexual activity: Not on file  Other Topics Concern   Not on file  Social History Narrative   Not on file   Social Determinants of Health   Financial Resource Strain: Not on file  Food Insecurity: Not on file  Transportation Needs: Not on file  Physical Activity: Not on file  Stress: Not on file  Social Connections: Not on file     Family History: The patient's family history includes Cancer in her paternal aunt; Heart attack in her father and mother; Heart disease in her father and mother; Hypertension in her mother; Rectal cancer (age of onset: 26) in her father; Stomach cancer in her paternal grandmother. There is no history of Colon cancer, Colon polyps, or Esophageal cancer.  ROS:   All other ROS reviewed and negative. Pertinent positives noted in the HPI.     EKGs/Labs/Other Studies Reviewed:   The following studies were personally reviewed by me today:  EKG:  EKG is ordered today.  The ekg ordered today demonstrates normal sinus rhythm heart rate 73, no acute ischemic changes or evidence of action, and was personally reviewed by me.   Recent Labs: 03/12/2021: ALT 10; BUN 16;  Creatinine, Ser 0.83; Potassium 4.4; Sodium 138   Recent Lipid Panel    Component Value Date/Time   CHOL 132 02/01/2021 1146   CHOL 121 06/28/2020 1150   TRIG 77.0 02/01/2021 1146   HDL 40.70 02/01/2021 1146   HDL 38 (L) 06/28/2020 1150   CHOLHDL 3 02/01/2021 1146   VLDL 15.4 02/01/2021 1146   LDLCALC 76 02/01/2021 1146   LDLCALC 62 06/28/2020 1150    Physical Exam:   VS:  BP 115/62   Pulse 81   Ht '5\' 3"'$  (1.6 m)   Wt 105 lb (47.6 kg)   LMP  (LMP Unknown)   SpO2 98%  BMI 18.60 kg/m    Wt Readings from Last 3 Encounters:  04/15/21 105 lb (47.6 kg)  02/01/21 112 lb 6.4 oz (51 kg)  10/11/20 112 lb 4 oz (50.9 kg)    General: Well nourished, well developed, in no acute distress Head: Atraumatic, normal size  Eyes: PEERLA, EOMI  Neck: Supple, no JVD Endocrine: No thryomegaly Cardiac: Normal S1, S2; RRR; no murmurs, rubs, or gallops Lungs: Clear to auscultation bilaterally, no wheezing, rhonchi or rales  Abd: Soft, nontender, no hepatomegaly  Ext: No edema, pulses 2+ Musculoskeletal: No deformities, BUE and BLE strength normal and equal Skin: Warm and dry, no rashes   Neuro: Alert and oriented to person, place, time, and situation, CNII-XII grossly intact, no focal deficits  Psych: Normal mood and affect   ASSESSMENT:   Delacey Lobb is a 65 y.o. female who presents for the following: 1. Coronary artery disease involving native coronary artery of native heart with unstable angina pectoris ([place])   2. Hyperlipidemia, unspecified hyperlipidemia type     PLAN:   1. Coronary artery disease involving native coronary artery of native heart with unstable angina pectoris ([place]) 2. Hyperlipidemia, unspecified hyperlipidemia type -She presents with several episodes of chest pain.  Can occur with heavy exertion.  Also describes left arm pain.  She has been intolerant of antianginal therapy.  She cannot tolerate metoprolol.  Blood pressure has precluded other agents. - symptoms are very  concerning for unstable angina.  She had 40% left main disease in 2017 as well as 50% mid LAD disease in 2017.  She has an occluded distal RCA with collaterals.  She also had 70% diagonal lesion.  I do have concerns that her CAD may have progressed.  It is reassuring her EKG is nonischemic in office today. -Given that her last evaluation was 5 years ago I think she merits left heart catheterization.  She will continue aspirin.  She was given nitroglycerin and strict return precautions.  She has no room on blood pressure for antianginal agents.  We will start Ranexa 500 mg twice daily.  This may be the best option for her. -We discussed risks and benefits of left heart catheterization.  I think this is merited.  She will proceed.  She is willing to proceed.  She accepts the risk.  See consent statement below. -Her lipids are not controlled.  This has been an issue.  She can only tolerate Crestor 3 days/week.  She had pruritus of the PCSK9 inhibitor site injection.  She is interested in inclusirin as this would require 33-monthinjections.  We will refer her to pharmacy for this. -I will see her back in 6 weeks.  Hopefully this can be managed medically if not she may need to consider bypass surgery depending on what we find.  Shared Decision Making/Informed Consent The risks [stroke (1 in 1000), death (1 in 1000), kidney failure [usually temporary] (1 in 500), bleeding (1 in 200), allergic reaction [possibly serious] (1 in 200)], benefits (diagnostic support and management of coronary artery disease) and alternatives of a cardiac catheterization were discussed in detail with Ms. BStonebargerand she is willing to proceed.  Disposition: Return in about 6 weeks (around 05/27/2021).  Medication Adjustments/Labs and Tests Ordered: Current medicines are reviewed at length with the patient today.  Concerns regarding medicines are outlined above.  Orders Placed This Encounter  Procedures   CBC   Basic metabolic panel    EKG [id number]  Meds ordered this encounter  Medications   ranolazine (RANEXA) 500 MG 12 hr tablet    Sig: Take 1 tablet (500 mg total) by mouth 2 (two) times daily.    Dispense:  180 tablet    Refill:  1   nitroGLYCERIN (NITROSTAT) 0.4 MG SL tablet    Sig: Place 1 tablet (0.4 mg total) under the tongue every 5 (five) minutes as needed for chest pain.    Dispense:  90 tablet    Refill:  3    Patient Instructions  Medication Instructions:  Take Nitroglycerin as needed per MD instruction Start Renaxa 500 mg twice daily.  *If you need a refill on your cardiac medications before your next appointment, please call your pharmacy*   Lab Work: CBC, BMET today   If you have labs (blood work) drawn today and your tests are completely normal, you will receive your results only by: [place] (if you have MyChart) OR A paper copy in the mail If you have any lab test that is abnormal or we need to change your treatment, we will call you to review the results.   Testing/Procedures: Your physician has requested that you have a cardiac catheterization. Cardiac catheterization is used to diagnose and/or treat various heart conditions. Doctors may recommend this procedure for a number of different reasons. The most common reason is to evaluate chest pain. Chest pain can be a symptom of coronary artery disease (CAD), and cardiac catheterization can show whether plaque is narrowing or blocking your heart's arteries. This procedure is also used to evaluate the valves, as well as measure the blood flow and oxygen levels in different parts of your heart. For further information please visit [email or web address]. Please follow instruction sheet, as given.   Follow-Up: At [facility], you and your health needs are our priority.  As part of our continuing mission to provide you with exceptional heart care, we have created designated Provider Care Teams.  These Care Teams include your primary  Cardiologist (physician) and Advanced Practice Providers (APPs -  Physician Assistants and Nurse Practitioners) who all work together to provide you with the care you need, when you need it.  We recommend signing up for the patient portal called "MyChart".  Sign up information is provided on this After Visit Summary.  MyChart is used to connect with patients for Virtual Visits (Telemedicine).  Patients are able to view lab/test results, encounter notes, upcoming appointments, etc.  Non-urgent messages can be sent to your provider as well.   To learn more about what you can do with MyChart, go to [email or web address].    Your next appointment:   September 26th at 10:00 AM with Dr.O'Neal    Other Instructions  [place] [address] [place] [address] Dept: [phone] Loc: [place]  04/15/2021  You are scheduled for a Cardiac Catheterization on Monday, August 15 with Dr. Daneen Schick.  1. Please arrive at the [facility] (Main Entrance A) at [facility]: [address] [place], [place] [address] at 5:30 AM (This time is two hours before your procedure to ensure your preparation). Free valet parking service is available.   Special note: Every effort is made to have your procedure done on time. Please understand that emergencies sometimes delay scheduled procedures.  2. Diet: Do not eat solid foods after midnight.  The patient may have clear liquids until 5am upon the day of the procedure.  3. Labs: You  will need to have blood drawn today (BMET, CBC). You do not need to be fasting.  4. Medication instructions in preparation for your procedure:   Contrast Allergy: No  On the morning of your procedure, take your Aspirin and any morning medicines NOT listed above.  You may use sips of water.  5. Plan for one night stay--bring personal belongings. 6. Bring a current list  of your medications and current insurance cards. 7. You MUST have a responsible person to drive you home. 8. Someone MUST be with you the first 24 hours after you arrive home or your discharge will be delayed. 9. Please wear clothes that are easy to get on and off and wear slip-on shoes.  Thank you for allowing [place] to care for you!   -- [facility]     Time Spent with Patient: I have spent a total of 35 minutes with patient reviewing hospital notes, telemetry, EKGs, labs and examining the patient as well as establishing an assessment and plan that was discussed with the patient.  > 50% of time was spent in direct patient care.  Signed, Addison Naegeli. Audie Box, MD, [place]  [address] [place] [address], [place] [address] [phone]  04/15/2021 10:42 AM

## 2021-04-14 NOTE — H&P (View-Only) (Signed)
Cardiology Office Note:   Date:  04/15/2021  NAME:  Katrina Walters    MRN: [id number] DOB:  [birth year]   PCP:  Mackie Pai, PA-C  Cardiologist:  Skeet Latch, MD  Electrophysiologist:  None   Referring MD: Elise Benne   Chief Complaint  Patient presents with   Chest Pain   History of Present Illness:   Katrina Walters is a 65 y.o. female with a hx of 3-vessel CAD, HLD who presents for follow-up. Significant CAD in 2017 that was recommended to be managed medically vs CABG. she presents with her husband.  She is had several episodes of chest pain left arm pain over the last few months.  She reports last week she had a pain in her left chest with heavy exertion.  She actually was able to walk through it and the pain subsided.  She reports left arm pain while doing housekeeping this past week as well.  She reports she can maintain high level of activity walking up to 45 minutes with no major symptoms but does get episodes.  She has been intolerant of metoprolol as well as other antianginal agents.  She maintains a low body weight and very low blood pressure at baseline.  BP 115/62.  She has not been able to tolerate lipid-lowering agents.  She did not tolerate Repatha due to pruritus at the injection site.  She is taking Crestor 10 mg but this is every third day.  Her most recent lipid profile shows her LDL is 76.  This is not at goal.  She is interested in inclusirin.  Since she has Medicare she likely will qualify for this.  She is also worried about her symptoms.  She had significant blockages 5 years ago and has not been reevaluated.  EKG in office demonstrates sinus rhythm with no acute ischemic changes.  We discussed left heart catheterization she is willing to proceed.  Problem List CAD -LHC 07/2016 -LM 40% -mLAD 50% -D1 70% -OM1 40% -mRCA 50%, dRCA 100% -med management pursued  2. 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    Past Surgical History: Past Surgical History:  Procedure Laterality Date   ABDOMINAL HYSTERECTOMY  08/2019   ANAL RECTAL MANOMETRY N/A 11/12/2020   Procedure: ANO RECTAL MANOMETRY;  Surgeon: Lavena Bullion, DO;  Location: [facility];  Service: Gastroenterology;  Laterality: N/A;   BLADDER SURGERY  08/2019   BREAST REDUCTION SURGERY  2006   CARDIAC CATHETERIZATION N/A 07/17/2016   Procedure: Left Heart Cath and Coronary Angiography;  Surgeon: Burnell Blanks, MD;  Location: [place] [facility];  Service: Cardiovascular;  Laterality: N/A;   COLONOSCOPY  2015   POLYPECTOMY  2015    Current Medications: Current Meds  Medication Sig   aspirin EC 81 MG tablet Take 81 mg by mouth daily.   bisacodyl ([employer] C-LAX LAXATIVE) 5 MG EC tablet Take 10-15 mg by mouth daily as needed for moderate constipation (Take 2 tablet one day and 3 tablets the next. Alternate days).   colchicine 0.6 MG tablet Take 1 tablet (0.6 mg total) by mouth 2 (two) times daily.   diclofenac (VOLTAREN) 75 MG EC tablet Take 1 tablet (75 mg total) by mouth 2 (two) times daily  as needed.   Misc Natural Products (COMPLETE MENOPAUSE HEALTH PO) Take 1 Dose by mouth daily.    nitroGLYCERIN (NITROSTAT) 0.4 MG SL tablet Place 1 tablet (0.4 mg total) under the tongue every 5 (five) minutes as needed for chest pain.   NON FORMULARY daily. CBD oil - for pain   omeprazole (PRILOSEC) 40 MG capsule TAKE 1 CAPSULE BY MOUTH EVERY DAY   potassium chloride SA (KLOR-CON) 20 MEQ tablet Take 1 tablet (20 mEq total) by mouth 2 (two) times daily.   PREMARIN vaginal cream Place [id number] Applicatorfuls vaginally 2 (two) times a week.   ranolazine (RANEXA) 500 MG 12 hr tablet Take 1 tablet (500 mg total) by mouth 2 (two) times daily.    rosuvastatin (CRESTOR) 10 MG tablet Take 10 mg by mouth every other day.    sodium polystyrene (KAYEXALATE) 15 GM/60ML suspension 60 ml po q day for 4 days.     Allergies:    Amoxicillin, Clopidogrel & aspirin, Atorvastatin, Crestor [rosuvastatin], Metoprolol, and Pravastatin   Social History: Social History   Socioeconomic History   Marital status: Married    Spouse name: Not on file   Number of children: Not on file   Years of education: Not on file   Highest education level: Not on file  Occupational History   Occupation: unemployed at the time  Tobacco Use   Smoking status: Never   Smokeless tobacco: Never  Vaping Use   Vaping Use: Never used  Substance and Sexual Activity   Alcohol use: Not Currently    Alcohol/week: 0.0 standard drinks    Comment: occassionally   Drug use: No   Sexual activity: Not on file  Other Topics Concern   Not on file  Social History Narrative   Not on file   Social Determinants of Health   Financial Resource Strain: Not on file  Food Insecurity: Not on file  Transportation Needs: Not on file  Physical Activity: Not on file  Stress: Not on file  Social Connections: Not on file     Family History: The patient's family history includes Cancer in her paternal aunt; Heart attack in her father and mother; Heart disease in her father and mother; Hypertension in her mother; Rectal cancer (age of onset: 70) in her father; Stomach cancer in her paternal grandmother. There is no history of Colon cancer, Colon polyps, or Esophageal cancer.  ROS:   All other ROS reviewed and negative. Pertinent positives noted in the HPI.     EKGs/Labs/Other Studies Reviewed:   The following studies were personally reviewed by me today:  EKG:  EKG is ordered today.  The ekg ordered today demonstrates normal sinus rhythm heart rate 73, no acute ischemic changes or evidence of action, and was personally reviewed by me.   Recent Labs: 03/12/2021: ALT 10; BUN 16;  Creatinine, Ser 0.83; Potassium 4.4; Sodium 138   Recent Lipid Panel    Component Value Date/Time   CHOL 132 02/01/2021 1146   CHOL 121 06/28/2020 1150   TRIG 77.0 02/01/2021 1146   HDL 40.70 02/01/2021 1146   HDL 38 (L) 06/28/2020 1150   CHOLHDL 3 02/01/2021 1146   VLDL 15.4 02/01/2021 1146   LDLCALC 76 02/01/2021 1146   LDLCALC 62 06/28/2020 1150    Physical Exam:   VS:  BP 115/62   Pulse 81   Ht '5\' 3"'$  (1.6 m)   Wt 105 lb (47.6 kg)   LMP  (LMP Unknown)   SpO2 98%  BMI 18.60 kg/m    Wt Readings from Last 3 Encounters:  04/15/21 105 lb (47.6 kg)  02/01/21 112 lb 6.4 oz (51 kg)  10/11/20 112 lb 4 oz (50.9 kg)    General: Well nourished, well developed, in no acute distress Head: Atraumatic, normal size  Eyes: PEERLA, EOMI  Neck: Supple, no JVD Endocrine: No thryomegaly Cardiac: Normal S1, S2; RRR; no murmurs, rubs, or gallops Lungs: Clear to auscultation bilaterally, no wheezing, rhonchi or rales  Abd: Soft, nontender, no hepatomegaly  Ext: No edema, pulses 2+ Musculoskeletal: No deformities, BUE and BLE strength normal and equal Skin: Warm and dry, no rashes   Neuro: Alert and oriented to person, place, time, and situation, CNII-XII grossly intact, no focal deficits  Psych: Normal mood and affect   ASSESSMENT:   Sholonda Winegarner is a 65 y.o. female who presents for the following: 1. Coronary artery disease involving native coronary artery of native heart with unstable angina pectoris ([place])   2. Hyperlipidemia, unspecified hyperlipidemia type     PLAN:   1. Coronary artery disease involving native coronary artery of native heart with unstable angina pectoris ([place]) 2. Hyperlipidemia, unspecified hyperlipidemia type -She presents with several episodes of chest pain.  Can occur with heavy exertion.  Also describes left arm pain.  She has been intolerant of antianginal therapy.  She cannot tolerate metoprolol.  Blood pressure has precluded other agents. - symptoms are very  concerning for unstable angina.  She had 40% left main disease in 2017 as well as 50% mid LAD disease in 2017.  She has an occluded distal RCA with collaterals.  She also had 70% diagonal lesion.  I do have concerns that her CAD may have progressed.  It is reassuring her EKG is nonischemic in office today. -Given that her last evaluation was 5 years ago I think she merits left heart catheterization.  She will continue aspirin.  She was given nitroglycerin and strict return precautions.  She has no room on blood pressure for antianginal agents.  We will start Ranexa 500 mg twice daily.  This may be the best option for her. -We discussed risks and benefits of left heart catheterization.  I think this is merited.  She will proceed.  She is willing to proceed.  She accepts the risk.  See consent statement below. -Her lipids are not controlled.  This has been an issue.  She can only tolerate Crestor 3 days/week.  She had pruritus of the PCSK9 inhibitor site injection.  She is interested in inclusirin as this would require 51-monthinjections.  We will refer her to pharmacy for this. -I will see her back in 6 weeks.  Hopefully this can be managed medically if not she may need to consider bypass surgery depending on what we find.  Shared Decision Making/Informed Consent The risks [stroke (1 in 1000), death (1 in 1000), kidney failure [usually temporary] (1 in 500), bleeding (1 in 200), allergic reaction [possibly serious] (1 in 200)], benefits (diagnostic support and management of coronary artery disease) and alternatives of a cardiac catheterization were discussed in detail with Ms. BFederoffand she is willing to proceed.  Disposition: Return in about 6 weeks (around 05/27/2021).  Medication Adjustments/Labs and Tests Ordered: Current medicines are reviewed at length with the patient today.  Concerns regarding medicines are outlined above.  Orders Placed This Encounter  Procedures   CBC   Basic metabolic panel    EKG [id number]  Meds ordered this encounter  Medications   ranolazine (RANEXA) 500 MG 12 hr tablet    Sig: Take 1 tablet (500 mg total) by mouth 2 (two) times daily.    Dispense:  180 tablet    Refill:  1   nitroGLYCERIN (NITROSTAT) 0.4 MG SL tablet    Sig: Place 1 tablet (0.4 mg total) under the tongue every 5 (five) minutes as needed for chest pain.    Dispense:  90 tablet    Refill:  3    Patient Instructions  Medication Instructions:  Take Nitroglycerin as needed per MD instruction Start Renaxa 500 mg twice daily.  *If you need a refill on your cardiac medications before your next appointment, please call your pharmacy*   Lab Work: CBC, BMET today   If you have labs (blood work) drawn today and your tests are completely normal, you will receive your results only by: [place] (if you have MyChart) OR A paper copy in the mail If you have any lab test that is abnormal or we need to change your treatment, we will call you to review the results.   Testing/Procedures: Your physician has requested that you have a cardiac catheterization. Cardiac catheterization is used to diagnose and/or treat various heart conditions. Doctors may recommend this procedure for a number of different reasons. The most common reason is to evaluate chest pain. Chest pain can be a symptom of coronary artery disease (CAD), and cardiac catheterization can show whether plaque is narrowing or blocking your heart's arteries. This procedure is also used to evaluate the valves, as well as measure the blood flow and oxygen levels in different parts of your heart. For further information please visit [email or web address]. Please follow instruction sheet, as given.   Follow-Up: At [facility], you and your health needs are our priority.  As part of our continuing mission to provide you with exceptional heart care, we have created designated Provider Care Teams.  These Care Teams include your primary  Cardiologist (physician) and Advanced Practice Providers (APPs -  Physician Assistants and Nurse Practitioners) who all work together to provide you with the care you need, when you need it.  We recommend signing up for the patient portal called "MyChart".  Sign up information is provided on this After Visit Summary.  MyChart is used to connect with patients for Virtual Visits (Telemedicine).  Patients are able to view lab/test results, encounter notes, upcoming appointments, etc.  Non-urgent messages can be sent to your provider as well.   To learn more about what you can do with MyChart, go to [email or web address].    Your next appointment:   September 26th at 10:00 AM with Dr.O'Neal    Other Instructions  [place] [address] Dept: [phone] Loc: [place]  04/15/2021  You are scheduled for a Cardiac Catheterization on Monday, August 15 with Dr. Daneen Schick.  1. Please arrive at the [facility] (Main Entrance A) at [facility]: [address] [place], [place] [address] at 5:30 AM (This time is two hours before your procedure to ensure your preparation). Free valet parking service is available.   Special note: Every effort is made to have your procedure done on time. Please understand that emergencies sometimes delay scheduled procedures.  2. Diet: Do not eat solid foods after midnight.  The patient may have clear liquids until 5am upon the day of the procedure.  3. Labs: You  will need to have blood drawn today (BMET, CBC). You do not need to be fasting.  4. Medication instructions in preparation for your procedure:   Contrast Allergy: No  On the morning of your procedure, take your Aspirin and any morning medicines NOT listed above.  You may use sips of water.  5. Plan for one night stay--bring personal belongings. 6. Bring a current list  of your medications and current insurance cards. 7. You MUST have a responsible person to drive you home. 8. Someone MUST be with you the first 24 hours after you arrive home or your discharge will be delayed. 9. Please wear clothes that are easy to get on and off and wear slip-on shoes.  Thank you for allowing [place] to care for you!   -- [place] [facility]     Time Spent with Patient: I have spent a total of 35 minutes with patient reviewing hospital notes, telemetry, EKGs, labs and examining the patient as well as establishing an assessment and plan that was discussed with the patient.  > 50% of time was spent in direct patient care.  Signed, Addison Naegeli. Audie Box, MD, [place]  [address] [place], [place] [address] [phone]  04/15/2021 10:42 AM

## 2021-04-15 ENCOUNTER — Encounter: Payer: Self-pay | Admitting: Cardiovascular Disease

## 2021-04-15 ENCOUNTER — Other Ambulatory Visit: Payer: Self-pay

## 2021-04-15 ENCOUNTER — Ambulatory Visit: Payer: Medicare Other | Admitting: Cardiovascular Disease

## 2021-04-15 VITALS — BP 115/62 | HR 81 | Ht 63.0 in | Wt 105.0 lb

## 2021-04-15 DIAGNOSIS — E785 Hyperlipidemia, unspecified: Secondary | ICD-10-CM | POA: Diagnosis not present

## 2021-04-15 DIAGNOSIS — I25110 Atherosclerotic heart disease of native coronary artery with unstable angina pectoris: Secondary | ICD-10-CM | POA: Diagnosis not present

## 2021-04-15 DIAGNOSIS — I25118 Atherosclerotic heart disease of native coronary artery with other forms of angina pectoris: Secondary | ICD-10-CM

## 2021-04-15 LAB — CBC
Hematocrit: 45.4 % (ref 34.0–46.6)
Hemoglobin: 13.1 g/dL (ref 11.1–15.9)
MCH: 19.6 pg — ABNORMAL LOW (ref 26.6–33.0)
MCHC: 28.9 g/dL — ABNORMAL LOW (ref 31.5–35.7)
MCV: 68 fL — ABNORMAL LOW (ref 79–97)
Platelets: 441 10*3/uL (ref 150–450)
RBC: 6.67 x10E6/uL — ABNORMAL HIGH (ref 3.77–5.28)
RDW: 21.6 % — ABNORMAL HIGH (ref 11.7–15.4)
WBC: 9.6 10*3/uL (ref 3.4–10.8)

## 2021-04-15 LAB — BASIC METABOLIC PANEL
BUN/Creatinine Ratio: 17 (ref 12–28)
BUN: 14 mg/dL (ref 8–27)
CO2: 23 mmol/L (ref 20–29)
Calcium: 10.1 mg/dL (ref 8.7–10.3)
Chloride: 101 mmol/L (ref 96–106)
Creatinine, Ser: 0.81 mg/dL (ref 0.57–1.00)
Glucose: 77 mg/dL (ref 65–99)
Potassium: 5.4 mmol/L — ABNORMAL HIGH (ref 3.5–5.2)
Sodium: 139 mmol/L (ref 134–144)
eGFR: 81 mL/min/{1.73_m2} (ref >59)

## 2021-04-15 MED ORDER — RANOLAZINE ER 500 MG PO TB12: 500.0000 mg | ORAL_TABLET | Freq: Two times a day (BID) | ORAL | 1 refills | Status: DC

## 2021-04-15 MED ORDER — NITROGLYCERIN 0.4 MG SL SUBL: 0.4000 mg | SUBLINGUAL_TABLET | SUBLINGUAL | 3 refills | Status: DC | PRN

## 2021-04-15 NOTE — Patient Instructions (Signed)
Medication Instructions:  Take Nitroglycerin as needed per MD instruction Start Renaxa 500 mg twice daily.  *If you need a refill on your cardiac medications before your next appointment, please call your pharmacy*   Lab Work: CBC, BMET today   If you have labs (blood work) drawn today and your tests are completely normal, you will receive your results only by: [place] (if you have MyChart) OR A paper copy in the mail If you have any lab test that is abnormal or we need to change your treatment, we will call you to review the results.   Testing/Procedures: Your physician has requested that you have a cardiac catheterization. Cardiac catheterization is used to diagnose and/or treat various heart conditions. Doctors may recommend this procedure for a number of different reasons. The most common reason is to evaluate chest pain. Chest pain can be a symptom of coronary artery disease (CAD), and cardiac catheterization can show whether plaque is narrowing or blocking your heart's arteries. This procedure is also used to evaluate the valves, as well as measure the blood flow and oxygen levels in different parts of your heart. For further information please visit [email or web address]. Please follow instruction sheet, as given.   Follow-Up: At [facility], you and your health needs are our priority.  As part of our continuing mission to provide you with exceptional heart care, we have created designated Provider Care Teams.  These Care Teams include your primary Cardiologist (physician) and Advanced Practice Providers (APPs -  Physician Assistants and Nurse Practitioners) who all work together to provide you with the care you need, when you need it.  We recommend signing up for the patient portal called "MyChart".  Sign up information is provided on this After Visit Summary.  MyChart is used to connect with patients for Virtual Visits (Telemedicine).  Patients are able to view lab/test  results, encounter notes, upcoming appointments, etc.  Non-urgent messages can be sent to your provider as well.   To learn more about what you can do with MyChart, go to [email or web address].    Your next appointment:   September 26th at 10:00 AM with Dr.O'Neal    Other Instructions  [place] [address] Dept: [phone] Loc: [place]  04/15/2021  You are scheduled for a Cardiac Catheterization on Monday, August 15 with Dr. Daneen Schick.  1. Please arrive at the [facility] (Main Entrance A) at [facility]: [address] [place], [place] [address] at 5:30 AM (This time is two hours before your procedure to ensure your preparation). Free valet parking service is available.   Special note: Every effort is made to have your procedure done on time. Please understand that emergencies sometimes delay scheduled procedures.  2. Diet: Do not eat solid foods after midnight.  The patient may have clear liquids until 5am upon the day of the procedure.  3. Labs: You will need to have blood drawn today (BMET, CBC). You do not need to be fasting.  4. Medication instructions in preparation for your procedure:   Contrast Allergy: No  On the morning of your procedure, take your Aspirin and any morning medicines NOT listed above.  You may use sips of water.  5. Plan for one night stay--bring personal belongings. 6. Bring a current list of your medications and current insurance cards. 7. You MUST have a responsible person to drive you home. 8. Someone  MUST be with you the first 24 hours after you arrive home or your discharge will be delayed. 9. Please wear clothes that are easy to get on and off and wear slip-on shoes.  Thank you for allowing [place] to care for you!   -- [place] [facility]

## 2021-04-17 ENCOUNTER — Other Ambulatory Visit: Payer: Self-pay | Admitting: Medical

## 2021-04-18 ENCOUNTER — Telehealth: Payer: Self-pay | Admitting: *Deleted

## 2021-04-18 NOTE — Telephone Encounter (Signed)
Cardiac catheterization scheduled at [facility] for: Monday April 22, 2021 7:30 [facility] Main Entrance A [facility]) at: 5:30 AM   No solid food after midnight prior to cath, clear liquids until 5 AM day of procedure.  Morning medications can be taken pre-cath with sips of water including aspirin 81 mg. Patient reports she is currently taking and tolerating aspirin 81 mg.     Confirmed patient has responsible adult to drive home post procedure and be with patient first 24 hours after arriving home.  Patients are allowed one visitor in the waiting room during the time they are at the hospital for their procedure. Both patient and visitor must wear a mask once they enter the hospital.   Patient reports does not currently have any symptoms concerning for COVID-19 and no household members with COVID-19 like illness.            Reviewed procedure/mask/visitor instructions with patient.

## 2021-04-21 NOTE — H&P (Signed)
CAD moderate 3 vessel with 100% distal RCA.

## 2021-04-22 ENCOUNTER — Other Ambulatory Visit: Payer: Self-pay

## 2021-04-22 ENCOUNTER — Encounter (HOSPITAL_COMMUNITY)
Admission: RE | Disposition: A | Discharge status: Home / Self Care | Payer: Self-pay | Source: Home / Self Care | Attending: Interventional Cardiology

## 2021-04-22 ENCOUNTER — Encounter (HOSPITAL_COMMUNITY): Payer: Self-pay | Admitting: Interventional Cardiology

## 2021-04-22 ENCOUNTER — Ambulatory Visit (HOSPITAL_COMMUNITY)
Admission: RE | Admit: 2021-04-22 | Discharge: 2021-04-22 | Disposition: A | Discharge status: Home / Self Care | Payer: Medicare Other | Attending: Interventional Cardiology | Admitting: Interventional Cardiology

## 2021-04-22 DIAGNOSIS — Z880 Allergy status to penicillin: Secondary | ICD-10-CM | POA: Insufficient documentation

## 2021-04-22 DIAGNOSIS — Z79899 Other long term (current) drug therapy: Secondary | ICD-10-CM | POA: Diagnosis not present

## 2021-04-22 DIAGNOSIS — R0602 Shortness of breath: Secondary | ICD-10-CM | POA: Diagnosis present

## 2021-04-22 DIAGNOSIS — Z8249 Family history of ischemic heart disease and other diseases of the circulatory system: Secondary | ICD-10-CM | POA: Diagnosis not present

## 2021-04-22 DIAGNOSIS — Z888 Allergy status to other drugs, medicaments and biological substances status: Secondary | ICD-10-CM | POA: Diagnosis not present

## 2021-04-22 DIAGNOSIS — I2510 Atherosclerotic heart disease of native coronary artery without angina pectoris: Secondary | ICD-10-CM | POA: Diagnosis present

## 2021-04-22 DIAGNOSIS — I25110 Atherosclerotic heart disease of native coronary artery with unstable angina pectoris: Secondary | ICD-10-CM | POA: Insufficient documentation

## 2021-04-22 DIAGNOSIS — Z886 Allergy status to analgesic agent status: Secondary | ICD-10-CM | POA: Insufficient documentation

## 2021-04-22 DIAGNOSIS — I25118 Atherosclerotic heart disease of native coronary artery with other forms of angina pectoris: Secondary | ICD-10-CM | POA: Diagnosis not present

## 2021-04-22 DIAGNOSIS — E785 Hyperlipidemia, unspecified: Secondary | ICD-10-CM | POA: Diagnosis present

## 2021-04-22 DIAGNOSIS — Z7982 Long term (current) use of aspirin: Secondary | ICD-10-CM | POA: Diagnosis not present

## 2021-04-22 HISTORY — PX: LEFT HEART CATH AND CORONARY ANGIOGRAPHY: CATH118249

## 2021-04-22 SURGERY — LEFT HEART CATH AND CORONARY ANGIOGRAPHY
Anesthesia: LOCAL

## 2021-04-22 MED ORDER — HEPARIN (PORCINE) IN NACL 1000-0.9 UT/500ML-% IV SOLN
INTRAVENOUS | Status: AC
  Filled 2021-04-22: qty 1000

## 2021-04-22 MED ORDER — MIDAZOLAM HCL 2 MG/2ML IJ SOLN
INTRAMUSCULAR | Status: DC | PRN
  Administered 2021-04-22: 1 mg via INTRAVENOUS
  Administered 2021-04-22 (×2): 0.5 mg via INTRAVENOUS

## 2021-04-22 MED ORDER — ASPIRIN 81 MG PO CHEW: 81.0000 mg | CHEWABLE_TABLET | Freq: Every day | ORAL | Status: DC

## 2021-04-22 MED ORDER — SODIUM CHLORIDE 0.9% FLUSH: 3.0000 mL | Freq: Two times a day (BID) | INTRAVENOUS | Status: DC

## 2021-04-22 MED ORDER — SODIUM CHLORIDE 0.9 % IV SOLN: 250.0000 mL | INTRAVENOUS | Status: DC | PRN

## 2021-04-22 MED ORDER — SODIUM CHLORIDE 0.9% FLUSH: 3.0000 mL | INTRAVENOUS | Status: DC | PRN

## 2021-04-22 MED ORDER — SODIUM CHLORIDE 0.9 % IV SOLN: INTRAVENOUS | Status: DC

## 2021-04-22 MED ORDER — NITROGLYCERIN 1 MG/10 ML FOR IR/CATH LAB
INTRA_ARTERIAL | Status: AC
  Filled 2021-04-22: qty 10

## 2021-04-22 MED ORDER — ONDANSETRON HCL 4 MG/2ML IJ SOLN: 4.0000 mg | Freq: Four times a day (QID) | INTRAMUSCULAR | Status: DC | PRN

## 2021-04-22 MED ORDER — HEPARIN SODIUM (PORCINE) 1000 UNIT/ML IJ SOLN
INTRAMUSCULAR | Status: DC | PRN
  Administered 2021-04-22: 2500 [IU] via INTRAVENOUS

## 2021-04-22 MED ORDER — IOHEXOL 350 MG/ML SOLN
INTRAVENOUS | Status: DC | PRN
  Administered 2021-04-22: 60 mL

## 2021-04-22 MED ORDER — NITROGLYCERIN 1 MG/10 ML FOR IR/CATH LAB
INTRA_ARTERIAL | Status: DC | PRN
  Administered 2021-04-22: 200 ug via INTRACORONARY

## 2021-04-22 MED ORDER — HEPARIN SODIUM (PORCINE) 1000 UNIT/ML IJ SOLN
INTRAMUSCULAR | Status: AC
  Filled 2021-04-22: qty 1

## 2021-04-22 MED ORDER — SODIUM CHLORIDE 0.9 % WEIGHT BASED INFUSION: 1.0000 mL/kg/h | INTRAVENOUS | Status: DC

## 2021-04-22 MED ORDER — LABETALOL HCL 5 MG/ML IV SOLN: 10.0000 mg | INTRAVENOUS | Status: DC | PRN

## 2021-04-22 MED ORDER — VERAPAMIL HCL 2.5 MG/ML IV SOLN
INTRAVENOUS | Status: AC
  Filled 2021-04-22: qty 2

## 2021-04-22 MED ORDER — ASPIRIN 81 MG PO CHEW: 81.0000 mg | CHEWABLE_TABLET | ORAL | Status: AC

## 2021-04-22 MED ORDER — SODIUM CHLORIDE 0.9 % WEIGHT BASED INFUSION
3.0000 mL/kg/h | INTRAVENOUS | Status: AC
  Administered 2021-04-22: 3 mL/kg/h via INTRAVENOUS

## 2021-04-22 MED ORDER — VERAPAMIL HCL 2.5 MG/ML IV SOLN
INTRAVENOUS | Status: DC | PRN
  Administered 2021-04-22: 10 mL via INTRA_ARTERIAL

## 2021-04-22 MED ORDER — MIDAZOLAM HCL 2 MG/2ML IJ SOLN
INTRAMUSCULAR | Status: AC
  Filled 2021-04-22: qty 2

## 2021-04-22 MED ORDER — ACETAMINOPHEN 325 MG PO TABS: 650.0000 mg | ORAL_TABLET | ORAL | Status: DC | PRN

## 2021-04-22 MED ORDER — HYDRALAZINE HCL 20 MG/ML IJ SOLN: 10.0000 mg | INTRAMUSCULAR | Status: DC | PRN

## 2021-04-22 MED ORDER — LIDOCAINE HCL (PF) 1 % IJ SOLN
INTRAMUSCULAR | Status: DC | PRN
  Administered 2021-04-22: 2 mL

## 2021-04-22 MED ORDER — FENTANYL CITRATE (PF) 100 MCG/2ML IJ SOLN
INTRAMUSCULAR | Status: AC
  Filled 2021-04-22: qty 2

## 2021-04-22 MED ORDER — HEPARIN (PORCINE) IN NACL 1000-0.9 UT/500ML-% IV SOLN
INTRAVENOUS | Status: DC | PRN
  Administered 2021-04-22 (×2): 500 mL

## 2021-04-22 MED ORDER — FENTANYL CITRATE (PF) 100 MCG/2ML IJ SOLN
INTRAMUSCULAR | Status: DC | PRN
  Administered 2021-04-22 (×2): 25 ug via INTRAVENOUS

## 2021-04-22 MED ORDER — LIDOCAINE HCL (PF) 1 % IJ SOLN
INTRAMUSCULAR | Status: AC
  Filled 2021-04-22: qty 30

## 2021-04-22 SURGICAL SUPPLY — 11 items
CATH 5FR JL3.5 JR4 ANG PIG MP (CATHETERS) ×1 IMPLANT
CATH LAUNCHER 5F EBU3.0 (CATHETERS) IMPLANT
CATHETER LAUNCHER 5F EBU3.0 (CATHETERS) ×2
DEVICE RAD TR BAND REGULAR (VASCULAR PRODUCTS) ×1 IMPLANT
GLIDESHEATH SLEND A-KIT 6F 22G (SHEATH) ×1 IMPLANT
GUIDEWIRE INQWIRE 1.5J.035X260 (WIRE) IMPLANT
INQWIRE 1.5J .035X260CM (WIRE) ×2
KIT HEART LEFT (KITS) ×2 IMPLANT
PACK CARDIAC CATHETERIZATION (CUSTOM PROCEDURE TRAY) ×2 IMPLANT
TRANSDUCER W/STOPCOCK (MISCELLANEOUS) ×2 IMPLANT
TUBING CIL FLEX 10 FLL-RA (TUBING) ×2 IMPLANT

## 2021-04-22 NOTE — CV Procedure (Signed)
Total occlusion distal RCA with left-to-right collaterals. Calcified 60% mid to distal left main with pressure damping in the ostium of the left main during engagement with 5 French catheters. Proximal 50 to 70% LAD beyond the first diagonal takeoff.  First diagonal is large and contains segmental 80% stenosis. The circumflex is free of obstructive disease. Normal left ventricular function with LVEDP 6 mmHg.  Continue aggressive preventive therapy and consider elective CABG with LIMA to LAD, SVG to diagonal, SVG to marginal, and sequential vein grafting to PDA and LV branch if large enough.

## 2021-04-22 NOTE — Interval H&P Note (Signed)
Cath Lab Visit (complete for each Cath Lab visit)  Clinical Evaluation Leading to the Procedure:   ACS: No.  Non-ACS:    Anginal Classification: CCS III  Anti-ischemic medical therapy: Minimal Therapy (1 class of medications)  Non-Invasive Test Results: No non-invasive testing performed  Prior CABG: No previous CABG      History and Physical Interval Note:  04/22/2021 7:18 AM  Katrina Walters  has presented today for surgery, with the diagnosis of unstable angina.  The various methods of treatment have been discussed with the patient and family. After consideration of risks, benefits and other options for treatment, the patient has consented to  Procedure(s): LEFT HEART CATH AND CORONARY ANGIOGRAPHY (N/A) as a surgical intervention.  The patient's history has been reviewed, patient examined, no change in status, stable for surgery.  I have reviewed the patient's chart and labs.  Questions were answered to the patient's satisfaction.     Belva Crome III

## 2021-05-06 NOTE — H&P (View-Only) (Signed)
[address]       [place] [address]             [phone]        Nadiyah [facility] Medical Record [id number] Date of Birth: 09-03-1956  Referring: Geralynn Rile, * Primary Care: Mackie Pai, PA-C Primary Cardiologist:Tiffany Oval Linsey, MD  Chief Complaint:    Chief Complaint  Patient presents with   Coronary Artery Disease    Surgical consult, Cardiac Cath 04/22/21, ECHO 05/08/21    History of Present Illness:     Mrs. Lhotka is a 65 year old female is referred by Dr. Davina Poke for surgical evaluation of three-vessel coronary artery disease.  The patient states that that she has been having anginal symptoms and exertional dyspnea for at least 4 to 5 years.  Her anginal symptoms have been getting worse over the last several months.  She has a strong family history of coronary artery disease, and that her mother died in her 27s from a heart attack, and her father and uncle had open heart surgery in their 46s.  Currently she endorses some exertional dyspnea and anginal symptoms with ambulation.  This resolves after taking a break.  She denies any lower extremity swelling.  She has occasionally awoken from sleep with some sharp chest pain and palpitations.   Past Medical and Surgical History: Previous Chest Surgery: Breast reduction surgery Previous Chest Radiation: No Diabetes Mellitus: No.  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    Past Surgical History:  Procedure Laterality Date   ABDOMINAL HYSTERECTOMY  08/2019   ANAL RECTAL MANOMETRY N/A 11/12/2020   Procedure: ANO RECTAL MANOMETRY;  Surgeon: Lavena Bullion, DO;  Location: [facility];  Service: Gastroenterology;  Laterality: N/A;   BLADDER SURGERY  08/2019   BREAST REDUCTION SURGERY  2006   CARDIAC CATHETERIZATION N/A 07/17/2016   Procedure: Left Heart Cath and  Coronary Angiography;  Surgeon: Burnell Blanks, MD;  Location: [facility];  Service: Cardiovascular;  Laterality: N/A;   COLONOSCOPY  2015   LEFT HEART CATH AND CORONARY ANGIOGRAPHY N/A 04/22/2021   Procedure: LEFT HEART CATH AND CORONARY ANGIOGRAPHY;  Surgeon: Belva Crome, MD;  Location: [facility];  Service: Cardiovascular;  Laterality: N/A;   POLYPECTOMY  2015      Social History   Tobacco Use  Smoking Status Never  Smokeless Tobacco Never    Social History   Substance and Sexual Activity  Alcohol Use Not Currently   Alcohol/week: 0.0 standard drinks   Comment: occassionally     Allergies  Allergen Reactions   Amoxicillin     Had fever with flu like symptoms. Has patient had a PCN reaction causing immediate rash, facial/tongue/throat swelling, SOB or lightheadedness with hypotension: no Has patient had a PCN reaction causing severe rash involving mucus membranes or skin necrosis: no Has patient had a PCN reaction that required hospitalization no Has patient had a PCN reaction occurring within the last 10 years: yes If all of the above answers are "NO", then may proceed with Cephalosporin use.   Clopidogrel & Aspirin Itching   Atorvastatin     Myalgia   Crestor [Rosuvastatin]     Hair loss   Metoprolol Other (See Comments)    itching   Pravastatin     itching  Current Outpatient Medications  Medication Sig Dispense Refill   aspirin EC 81 MG tablet Take 81 mg by mouth daily.     bisacodyl ([employer] C-LAX LAXATIVE) 5 MG EC tablet Take 10-15 mg by mouth daily as needed for moderate constipation (Take 2 tablet one day and 3 tablets the next. Alternate days).     colchicine 0.6 MG tablet Take 1 tablet (0.6 mg total) by mouth 2 (two) times daily. 180 tablet 3   Cranberry-Vitamin C 250-60 MG CAPS Take 2 tablets by mouth daily.     diclofenac (VOLTAREN) 75 MG EC tablet Take 1 tablet (75 mg total) by mouth 2 (two) times daily as needed. (Patient  taking differently: Take 75 mg by mouth 2 (two) times daily as needed for mild pain or moderate pain.) 60 tablet 0   diphenhydrAMINE (BENADRYL) 25 MG tablet Take 25 mg by mouth at bedtime.     linaclotide (LINZESS) 290 MCG CAPS capsule Take 290 mcg by mouth daily before breakfast.     nitrofurantoin, macrocrystal-monohydrate, (MACROBID) 100 MG capsule Take 100 mg by mouth daily as needed (after intercourse).     nitroGLYCERIN (NITROSTAT) 0.4 MG SL tablet Place 1 tablet (0.4 mg total) under the tongue every 5 (five) minutes as needed for chest pain. 90 tablet 3   NON FORMULARY Take 5 tablets by mouth at bedtime. CBD oil - for pain Gummies     omeprazole (PRILOSEC) 40 MG capsule TAKE 1 CAPSULE BY MOUTH EVERY DAY 90 capsule 1   potassium chloride SA (KLOR-CON) 20 MEQ tablet Take 1 tablet (20 mEq total) by mouth 2 (two) times daily. 8 tablet 0   PREMARIN vaginal cream Place [id number] Applicatorfuls vaginally 2 (two) times a week. 30 g 0   rosuvastatin (CRESTOR) 10 MG tablet Take 10 mg by mouth every other day.      ranolazine (RANEXA) 500 MG 12 hr tablet Take 1 tablet (500 mg total) by mouth 2 (two) times daily. (Patient not taking: Reported on 05/10/2021) 180 tablet 1   sodium polystyrene (KAYEXALATE) 15 GM/60ML suspension 60 ml po q day for 4 days. (Patient not taking: Reported on 05/10/2021) 240 mL 0   No current facility-administered medications for this visit.    (Not in a hospital admission)   Family History  Problem Relation Age of Onset   Heart disease Mother        died at 34 of heart attack   Hypertension Mother    Heart attack Mother    Heart disease Father        had heart attack at age 8   Heart attack Father    Rectal cancer Father 69   Cancer Paternal Aunt    Stomach cancer Paternal Grandmother    Colon cancer Neg Hx    Colon polyps Neg Hx    Esophageal cancer Neg Hx      Review of Systems:   Review of Systems  Constitutional:  Positive for malaise/fatigue. Negative for  fever.  Respiratory:  Positive for shortness of breath.   Cardiovascular:  Positive for chest pain and palpitations. Negative for orthopnea and leg swelling.  Psychiatric/Behavioral: Negative.       Physical Exam: BP 133/79 (BP Location: Right Arm, Patient Position: Sitting)   Pulse 81   Resp 20   Ht 5' (1.524 m)   Wt 104 lb (47.2 kg)   LMP  (LMP Unknown)   SpO2 98% Comment: RA  BMI 20.31 kg/m  Physical Exam Constitutional:  General: She is not in acute distress.    Appearance: Normal appearance. She is normal weight. She is not ill-appearing.  HENT:     Head: Normocephalic and atraumatic.  Eyes:     Extraocular Movements: Extraocular movements intact.  Cardiovascular:     Rate and Rhythm: Normal rate.     Heart sounds: No murmur heard. Pulmonary:     Effort: Pulmonary effort is normal. No respiratory distress.  Abdominal:     General: Abdomen is flat. There is no distension.     Palpations: Abdomen is soft.  Musculoskeletal:        General: Normal range of motion.     Cervical back: Normal range of motion.  Skin:    General: Skin is warm and dry.  Neurological:     General: No focal deficit present.     Mental Status: She is alert and oriented to person, place, and time.  Psychiatric:        Mood and Affect: Mood normal.      Diagnostic Studies & Laboratory data:    Left Heart Catherization:   60% distal left main   40 to 50% proximal LAD followed by eccentric 70% stenosis in the proximal to mid segment.  First diagonal contains segmental 70 to 80% proximal stenosis.   Circumflex is free of critical obstructive disease.   RCA contains total occlusion of the PDA and a left ventricular branch that is supplied by left to right collaterals.   LVEDP was normal at 4 mmHg.  LV function is normal with EF 65%.  Echo: IMPRESSIONS     1. Left ventricular ejection fraction, by estimation, is 55 to 60%. The  left ventricle has normal function. The left ventricle  demonstrates  regional wall motion abnormalities (see scoring diagram/findings for  description). Left ventricular diastolic  parameters are consistent with Grade I diastolic dysfunction (impaired  relaxation). The average left ventricular global longitudinal strain is  -12.6 %. The global longitudinal strain is abnormal.   2. Right ventricular systolic function is normal. The right ventricular  size is normal.   3. The mitral valve is normal in structure. No evidence of mitral valve  regurgitation. No evidence of mitral stenosis.   4. The aortic valve is tricuspid. Aortic valve regurgitation is not  visualized. No aortic stenosis is present.   5. The inferior vena cava is normal in size with greater than 50%  respiratory variability, suggesting right atrial pressure of 3 mmHg.   FINDINGS   Left Ventricle: Left ventricular ejection fraction, by estimation, is 55  to 60%. The left ventricle has normal function. The left ventricle  demonstrates regional wall motion abnormalities. The average left  ventricular global longitudinal strain is -12.6   %. The global longitudinal strain is abnormal. The left ventricular  internal cavity size was normal in size. There is no left ventricular  hypertrophy. Left ventricular diastolic parameters are consistent with  Grade I diastolic dysfunction (impaired  relaxation). Normal left ventricular filling pressure.     I have independently reviewed the above radiologic studies and discussed with the patient   Recent Lab Findings: Lab Results  Component Value Date   WBC 9.6 04/15/2021   HGB 13.1 04/15/2021   HCT 45.4 04/15/2021   PLT 441 04/15/2021   GLUCOSE 77 04/15/2021   CHOL 132 02/01/2021   TRIG 77.0 02/01/2021   HDL 40.70 02/01/2021   LDLCALC 76 02/01/2021   ALT 10 03/12/2021   AST 15 03/12/2021  NA 139 04/15/2021   K 5.4 (H) 04/15/2021   CL 101 04/15/2021   CREATININE 0.81 04/15/2021   BUN 14 04/15/2021   CO2 23 04/15/2021   TSH  2.99 05/15/2016   INR 1.1 07/14/2016      Assessment / Plan:   65 year old female with three-vessel coronary artery disease.  On echocardiogram she has preserved left ventricular function and no significant valvular disease.  On review of her left heart cath she has good targets on the LAD and diagonal branch.  The PDA fills from left to right collaterals.  She also has a left main stenosis with good targets on the circumflex.  We discussed the risks and benefits of surgical revascularization and she is agreeable to proceed.  We will also harvest the left radial artery.  She is tentatively scheduled for 27 September.  I informed her that if her symptoms worsen, or progress that she must come to the emergency department for further evaluation.  Of note, the patient is on colchicine for Mediterranean fever.  She takes this on a daily basis and this will need to be resumed postoperatively.      I  spent 40 minutes counseling the patient face to face.   Lajuana Matte 05/10/2021 12:18 PM

## 2021-05-06 NOTE — Progress Notes (Signed)
Katrina Walters       [place] [address]             [phone]        Lari Ransdell [facility] Medical Record [id number] Date of Birth: Jul 03, 1956  Referring: Katrina Walters, * Primary Care: Katrina Pai, PA-C Primary Cardiologist:Katrina Oval Linsey, MD  Chief Complaint:    Chief Complaint  Patient presents with   Coronary Artery Disease    Surgical consult, Cardiac Cath 04/22/21, ECHO 05/08/21    History of Present Illness:     Mrs. Katrina Walters is a 65 year old female is referred by Dr. Davina Walters for surgical evaluation of three-vessel coronary artery disease.  The patient states that that she has been having anginal symptoms and exertional dyspnea for at least 4 to 5 years.  Her anginal symptoms have been getting worse over the last several months.  She has a strong family history of coronary artery disease, and that her mother died in her 41s from a heart attack, and her father and uncle had open heart surgery in their 10s.  Currently she endorses some exertional dyspnea and anginal symptoms with ambulation.  This resolves after taking a break.  She denies any lower extremity swelling.  She has occasionally awoken from sleep with some sharp chest pain and palpitations.   Past Medical and Surgical History: Previous Chest Surgery: Breast reduction surgery Previous Chest Radiation: No Diabetes Mellitus: No.  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    Past Surgical History:  Procedure Laterality Date   ABDOMINAL HYSTERECTOMY  08/2019   ANAL RECTAL MANOMETRY N/A 11/12/2020   Procedure: ANO RECTAL MANOMETRY;  Surgeon: Lavena Bullion, DO;  Location: [facility];  Service: Gastroenterology;  Laterality: N/A;   BLADDER SURGERY  08/2019   BREAST REDUCTION SURGERY  2006   CARDIAC CATHETERIZATION N/A 07/17/2016   Procedure: Left Heart Cath and  Coronary Angiography;  Surgeon: Burnell Blanks, MD;  Location: [facility];  Service: Cardiovascular;  Laterality: N/A;   COLONOSCOPY  2015   LEFT HEART CATH AND CORONARY ANGIOGRAPHY N/A 04/22/2021   Procedure: LEFT HEART CATH AND CORONARY ANGIOGRAPHY;  Surgeon: Belva Crome, MD;  Location: [facility];  Service: Cardiovascular;  Laterality: N/A;   POLYPECTOMY  2015      Social History   Tobacco Use  Smoking Status Never  Smokeless Tobacco Never    Social History   Substance and Sexual Activity  Alcohol Use Not Currently   Alcohol/week: 0.0 standard drinks   Comment: occassionally     Allergies  Allergen Reactions   Amoxicillin     Had fever with flu like symptoms. Has patient had a PCN reaction causing immediate rash, facial/tongue/throat swelling, SOB or lightheadedness with hypotension: no Has patient had a PCN reaction causing severe rash involving mucus membranes or skin necrosis: no Has patient had a PCN reaction that required hospitalization no Has patient had a PCN reaction occurring within the last 10 years: yes If all of the above answers are "NO", then may proceed with Cephalosporin use.   Clopidogrel & Aspirin Itching   Atorvastatin     Myalgia   Crestor [Rosuvastatin]     Hair loss   Metoprolol Other (See Comments)    itching   Pravastatin     itching  Current Outpatient Medications  Medication Sig Dispense Refill   aspirin EC 81 MG tablet Take 81 mg by mouth daily.     bisacodyl ([employer] C-LAX LAXATIVE) 5 MG EC tablet Take 10-15 mg by mouth daily as needed for moderate constipation (Take 2 tablet one day and 3 tablets the next. Alternate days).     colchicine 0.6 MG tablet Take 1 tablet (0.6 mg total) by mouth 2 (two) times daily. 180 tablet 3   Cranberry-Vitamin C 250-60 MG CAPS Take 2 tablets by mouth daily.     diclofenac (VOLTAREN) 75 MG EC tablet Take 1 tablet (75 mg total) by mouth 2 (two) times daily as needed. (Patient  taking differently: Take 75 mg by mouth 2 (two) times daily as needed for mild pain or moderate pain.) 60 tablet 0   diphenhydrAMINE (BENADRYL) 25 MG tablet Take 25 mg by mouth at bedtime.     linaclotide (LINZESS) 290 MCG CAPS capsule Take 290 mcg by mouth daily before breakfast.     nitrofurantoin, macrocrystal-monohydrate, (MACROBID) 100 MG capsule Take 100 mg by mouth daily as needed (after intercourse).     nitroGLYCERIN (NITROSTAT) 0.4 MG SL tablet Place 1 tablet (0.4 mg total) under the tongue every 5 (five) minutes as needed for chest pain. 90 tablet 3   NON FORMULARY Take 5 tablets by mouth at bedtime. CBD oil - for pain Gummies     omeprazole (PRILOSEC) 40 MG capsule TAKE 1 CAPSULE BY MOUTH EVERY DAY 90 capsule 1   potassium chloride SA (KLOR-CON) 20 MEQ tablet Take 1 tablet (20 mEq total) by mouth 2 (two) times daily. 8 tablet 0   PREMARIN vaginal cream Place [id number] Applicatorfuls vaginally 2 (two) times a week. 30 g 0   rosuvastatin (CRESTOR) 10 MG tablet Take 10 mg by mouth every other day.      ranolazine (RANEXA) 500 MG 12 hr tablet Take 1 tablet (500 mg total) by mouth 2 (two) times daily. (Patient not taking: Reported on 05/10/2021) 180 tablet 1   sodium polystyrene (KAYEXALATE) 15 GM/60ML suspension 60 ml po q day for 4 days. (Patient not taking: Reported on 05/10/2021) 240 mL 0   No current facility-administered medications for this visit.    (Not in a hospital admission)   Family History  Problem Relation Age of Onset   Heart disease Mother        died at 41 of heart attack   Hypertension Mother    Heart attack Mother    Heart disease Father        had heart attack at age 50   Heart attack Father    Rectal cancer Father 59   Cancer Paternal Aunt    Stomach cancer Paternal Grandmother    Colon cancer Neg Hx    Colon polyps Neg Hx    Esophageal cancer Neg Hx      Review of Systems:   Review of Systems  Constitutional:  Positive for malaise/fatigue. Negative for  fever.  Respiratory:  Positive for shortness of breath.   Cardiovascular:  Positive for chest pain and palpitations. Negative for orthopnea and leg swelling.  Psychiatric/Behavioral: Negative.       Physical Exam: BP 133/79 (BP Location: Right Arm, Patient Position: Sitting)   Pulse 81   Resp 20   Ht 5' (1.524 m)   Wt 104 lb (47.2 kg)   LMP  (LMP Unknown)   SpO2 98% Comment: RA  BMI 20.31 kg/m  Physical Exam Constitutional:  General: She is not in acute distress.    Appearance: Normal appearance. She is normal weight. She is not ill-appearing.  HENT:     Head: Normocephalic and atraumatic.  Eyes:     Extraocular Movements: Extraocular movements intact.  Cardiovascular:     Rate and Rhythm: Normal rate.     Heart sounds: No murmur heard. Pulmonary:     Effort: Pulmonary effort is normal. No respiratory distress.  Abdominal:     General: Abdomen is flat. There is no distension.     Palpations: Abdomen is soft.  Musculoskeletal:        General: Normal range of motion.     Cervical back: Normal range of motion.  Skin:    General: Skin is warm and dry.  Neurological:     General: No focal deficit present.     Mental Status: She is alert and oriented to person, place, and time.  Psychiatric:        Mood and Affect: Mood normal.      Diagnostic Studies & Laboratory data:    Left Heart Catherization:   60% distal left main   40 to 50% proximal LAD followed by eccentric 70% stenosis in the proximal to mid segment.  First diagonal contains segmental 70 to 80% proximal stenosis.   Circumflex is free of critical obstructive disease.   RCA contains total occlusion of the PDA and a left ventricular branch that is supplied by left to right collaterals.   LVEDP was normal at 4 mmHg.  LV function is normal with EF 65%.  Echo: IMPRESSIONS     1. Left ventricular ejection fraction, by estimation, is 55 to 60%. The  left ventricle has normal function. The left ventricle  demonstrates  regional wall motion abnormalities (see scoring diagram/findings for  description). Left ventricular diastolic  parameters are consistent with Grade I diastolic dysfunction (impaired  relaxation). The average left ventricular global longitudinal strain is  -12.6 %. The global longitudinal strain is abnormal.   2. Right ventricular systolic function is normal. The right ventricular  size is normal.   3. The mitral valve is normal in structure. No evidence of mitral valve  regurgitation. No evidence of mitral stenosis.   4. The aortic valve is tricuspid. Aortic valve regurgitation is not  visualized. No aortic stenosis is present.   5. The inferior vena cava is normal in size with greater than 50%  respiratory variability, suggesting right atrial pressure of 3 mmHg.   FINDINGS   Left Ventricle: Left ventricular ejection fraction, by estimation, is 55  to 60%. The left ventricle has normal function. The left ventricle  demonstrates regional wall motion abnormalities. The average left  ventricular global longitudinal strain is -12.6   %. The global longitudinal strain is abnormal. The left ventricular  internal cavity size was normal in size. There is no left ventricular  hypertrophy. Left ventricular diastolic parameters are consistent with  Grade I diastolic dysfunction (impaired  relaxation). Normal left ventricular filling pressure.     I have independently reviewed the above radiologic studies and discussed with the patient   Recent Lab Findings: Lab Results  Component Value Date   WBC 9.6 04/15/2021   HGB 13.1 04/15/2021   HCT 45.4 04/15/2021   PLT 441 04/15/2021   GLUCOSE 77 04/15/2021   CHOL 132 02/01/2021   TRIG 77.0 02/01/2021   HDL 40.70 02/01/2021   LDLCALC 76 02/01/2021   ALT 10 03/12/2021   AST 15 03/12/2021  NA 139 04/15/2021   K 5.4 (H) 04/15/2021   CL 101 04/15/2021   CREATININE 0.81 04/15/2021   BUN 14 04/15/2021   CO2 23 04/15/2021   TSH  2.99 05/15/2016   INR 1.1 07/14/2016      Assessment / Plan:   65 year old female with three-vessel coronary artery disease.  On echocardiogram she has preserved left ventricular function and no significant valvular disease.  On review of her left heart cath she has good targets on the LAD and diagonal branch.  The PDA fills from left to right collaterals.  She also has a left main stenosis with good targets on the circumflex.  We discussed the risks and benefits of surgical revascularization and she is agreeable to proceed.  We will also harvest the left radial artery.  She is tentatively scheduled for 27 September.  I informed her that if her symptoms worsen, or progress that she must come to the emergency department for further evaluation.  Of note, the patient is on colchicine for Mediterranean fever.  She takes this on a daily basis and this will need to be resumed postoperatively.      I  spent 40 minutes counseling the patient face to face.   Lajuana Matte 05/10/2021 12:18 PM

## 2021-05-07 ENCOUNTER — Other Ambulatory Visit: Payer: Self-pay | Admitting: *Deleted

## 2021-05-07 DIAGNOSIS — I25119 Atherosclerotic heart disease of native coronary artery with unspecified angina pectoris: Secondary | ICD-10-CM

## 2021-05-07 DIAGNOSIS — I2510 Atherosclerotic heart disease of native coronary artery without angina pectoris: Secondary | ICD-10-CM

## 2021-05-08 ENCOUNTER — Ambulatory Visit (INDEPENDENT_AMBULATORY_CARE_PROVIDER_SITE_OTHER): Payer: Medicare Other | Admitting: Medical

## 2021-05-08 ENCOUNTER — Ambulatory Visit (HOSPITAL_BASED_OUTPATIENT_CLINIC_OR_DEPARTMENT_OTHER)
Admission: RE | Admit: 2021-05-08 | Discharge: 2021-05-08 | Disposition: A | Discharge status: Home / Self Care | Payer: Medicare Other | Source: Ambulatory Visit | Attending: Thoracic Surgery (Cardiothoracic Vascular Surgery) | Admitting: Thoracic Surgery (Cardiothoracic Vascular Surgery)

## 2021-05-08 ENCOUNTER — Other Ambulatory Visit: Payer: Self-pay

## 2021-05-08 VITALS — BP 118/60 | HR 77 | Temp 98.2°F | Resp 18 | Ht 63.0 in | Wt 105.2 lb

## 2021-05-08 DIAGNOSIS — I25119 Atherosclerotic heart disease of native coronary artery with unspecified angina pectoris: Secondary | ICD-10-CM | POA: Diagnosis present

## 2021-05-08 DIAGNOSIS — I2510 Atherosclerotic heart disease of native coronary artery without angina pectoris: Secondary | ICD-10-CM | POA: Diagnosis present

## 2021-05-08 DIAGNOSIS — H6121 Impacted cerumen, right ear: Secondary | ICD-10-CM

## 2021-05-08 LAB — ECHOCARDIOGRAM COMPLETE
AR max vel: 2.96 cm2
AV Area VTI: 2.85 cm2
AV Area mean vel: 2.85 cm2
AV Mean grad: 2 mmHg
AV Peak grad: 2.7 mmHg
Ao pk vel: 0.81 m/s
Area-P 1/2: 3.19 cm2
Calc EF: 57.2 %
Height: 63 in
S' Lateral: 3.1 cm
Single Plane A2C EF: 62.6 %
Single Plane A4C EF: 54.1 %
Weight: 1683.2 oz

## 2021-05-08 NOTE — Progress Notes (Signed)
Subjective:    Patient ID: Katrina Walters, female    DOB: February 17, 1956, 65 y.o.   MRN: [id number]  HPI Pt states she had recent visit for hearing test but told can't check due to rt side wax. Pt states can't hear well. She tried some otc ear drops to soften wax.   Pt also update me on recent cardiac catheterization which shows needs procedure. Pt will be seeing cardiac surgeon on Friday. Getting echocardiogram today before seeing the surgeon.     Review of Systems  Constitutional:  Negative for chills, fatigue and fever.  HENT:         Rt ear decreasing hearing and blockage sensation.  Respiratory:  Negative for cough, chest tightness, shortness of breath and wheezing.   Cardiovascular:  Negative for chest pain and palpitations.  Gastrointestinal:  Negative for abdominal pain, blood in stool and nausea.  Musculoskeletal:  Negative for back pain.    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     Social History   Socioeconomic History   Marital status: Married    Spouse name: Not on file   Number of children: Not on file   Years of education: Not on file   Highest education level: Not on file  Occupational History   Occupation: unemployed at the time  Tobacco Use   Smoking status: Never   Smokeless tobacco: Never  Vaping Use   Vaping Use: Never used  Substance and Sexual Activity   Alcohol use: Not Currently    Alcohol/week: 0.0 standard drinks    Comment: occassionally   Drug use: No   Sexual activity: Not on file  Other Topics Concern   Not on file  Social History Narrative   Not on file   Social Determinants of Health   Financial Resource Strain: Not on file  Food Insecurity: Not on file  Transportation Needs: Not on file  Physical Activity: Not on file  Stress: Not on file  Social Connections: Not on file  Intimate Partner Violence: Not on file    Past Surgical History:   Procedure Laterality Date   ABDOMINAL HYSTERECTOMY  08/2019   ANAL RECTAL MANOMETRY N/A 11/12/2020   Procedure: ANO RECTAL MANOMETRY;  Surgeon: Lavena Bullion, DO;  Location: [facility];  Service: Gastroenterology;  Laterality: N/A;   BLADDER SURGERY  08/2019   BREAST REDUCTION SURGERY  2006   CARDIAC CATHETERIZATION N/A 07/17/2016   Procedure: Left Heart Cath and Coronary Angiography;  Surgeon: Burnell Blanks, MD;  Location: [facility];  Service: Cardiovascular;  Laterality: N/A;   COLONOSCOPY  2015   LEFT HEART CATH AND CORONARY ANGIOGRAPHY N/A 04/22/2021   Procedure: LEFT HEART CATH AND CORONARY ANGIOGRAPHY;  Surgeon: Belva Crome, MD;  Location: [facility];  Service: Cardiovascular;  Laterality: N/A;   POLYPECTOMY  2015    Family History  Problem Relation Age of Onset   Heart disease Mother        died at 71 of heart attack   Hypertension Mother    Heart attack Mother    Heart disease Father        had heart attack at age 72   Heart attack Father    Rectal cancer Father 63   Cancer Paternal Aunt    Stomach cancer Paternal Grandmother    Colon cancer Neg Hx  Colon polyps Neg Hx    Esophageal cancer Neg Hx     Allergies  Allergen Reactions   Amoxicillin     Had fever with flu like symptoms. Has patient had a PCN reaction causing immediate rash, facial/tongue/throat swelling, SOB or lightheadedness with hypotension: no Has patient had a PCN reaction causing severe rash involving mucus membranes or skin necrosis: no Has patient had a PCN reaction that required hospitalization no Has patient had a PCN reaction occurring within the last 10 years: yes If all of the above answers are "NO", then may proceed with Cephalosporin use.   Clopidogrel & Aspirin Itching   Atorvastatin     Myalgia   Crestor [Rosuvastatin]     Hair loss   Metoprolol Other (See Comments)    itching   Pravastatin     itching    Current Outpatient Medications on File  Prior to Visit  Medication Sig Dispense Refill   aspirin EC 81 MG tablet Take 81 mg by mouth daily.     bisacodyl ([employer] C-LAX LAXATIVE) 5 MG EC tablet Take 10-15 mg by mouth daily as needed for moderate constipation (Take 2 tablet one day and 3 tablets the next. Alternate days).     colchicine 0.6 MG tablet Take 1 tablet (0.6 mg total) by mouth 2 (two) times daily. 180 tablet 3   Cranberry-Vitamin C 250-60 MG CAPS Take 2 tablets by mouth daily.     diclofenac (VOLTAREN) 75 MG EC tablet Take 1 tablet (75 mg total) by mouth 2 (two) times daily as needed. (Patient taking differently: Take 75 mg by mouth 2 (two) times daily as needed for mild pain or moderate pain.) 60 tablet 0   diphenhydrAMINE (BENADRYL) 25 MG tablet Take 25 mg by mouth at bedtime.     linaclotide (LINZESS) 290 MCG CAPS capsule Take 290 mcg by mouth daily before breakfast.     nitrofurantoin, macrocrystal-monohydrate, (MACROBID) 100 MG capsule Take 100 mg by mouth daily as needed (after intercourse).     nitroGLYCERIN (NITROSTAT) 0.4 MG SL tablet Place 1 tablet (0.4 mg total) under the tongue every 5 (five) minutes as needed for chest pain. 90 tablet 3   NON FORMULARY Take 5 tablets by mouth at bedtime. CBD oil - for pain Gummies     omeprazole (PRILOSEC) 40 MG capsule TAKE 1 CAPSULE BY MOUTH EVERY DAY 90 capsule 1   potassium chloride SA (KLOR-CON) 20 MEQ tablet Take 1 tablet (20 mEq total) by mouth 2 (two) times daily. 8 tablet 0   PREMARIN vaginal cream Place [id number] Applicatorfuls vaginally 2 (two) times a week. 30 g 0   ranolazine (RANEXA) 500 MG 12 hr tablet Take 1 tablet (500 mg total) by mouth 2 (two) times daily. 180 tablet 1   rosuvastatin (CRESTOR) 10 MG tablet Take 10 mg by mouth every other day.      sodium polystyrene (KAYEXALATE) 15 GM/60ML suspension 60 ml po q day for 4 days. 240 mL 0   No current facility-administered medications on file prior to visit.    BP 118/60 (BP Location: Left Arm, Patient Position:  Sitting, Cuff Size: Normal)   Pulse 77   Temp 98.2 F (36.8 C) (Oral)   Resp 18   Ht '5\' 3"'$  (1.6 m)   Wt 105 lb 3.2 oz (47.7 kg)   LMP  (LMP Unknown)   SpO2 100%   BMI 18.64 kg/m       Objective:   Physical Exam  General-  No acute distress. Pleasant patient. Neck- Full range of motion, no jvd Lungs- Clear, even and unlabored. Heart- regular rate and rhythm. Neurologic- CNII- XII grossly intact.   Heent- normal except rt canal has some wax present and can't see tm. Post lavage could not see tm. Looks like hard small amount in canal.(Failed lavage and instrumentation)  Assessment & Plan:   Patient Instructions  Rt side ear wax that is hard despite use of over the counter wax softener. We attempted lavage and removal by instrumentation. Attempts unsuccessful. We need to refer you to ENT to remove wax. Placing referral.  For CAD follow thru with echo and discuss potential surgery this Friday with cardiothoracic surgeon.  Follow up as needed.   Mackie Pai, PA-C

## 2021-05-08 NOTE — Patient Instructions (Signed)
Rt side ear wax that is hard despite use of over the counter wax softener. We attempted lavage and removal by instrumentation. Attempts unsuccessful. We need to refer you to ENT to remove wax. Placing referral.  For CAD follow thru with echo and discuss potential surgery this Friday with cardiothoracic surgeon.  Follow up as needed.

## 2021-05-10 ENCOUNTER — Encounter: Payer: Self-pay | Admitting: Thoracic Surgery (Cardiothoracic Vascular Surgery)

## 2021-05-10 ENCOUNTER — Institutional Professional Consult (permissible substitution): Payer: Medicare Other | Admitting: Thoracic Surgery (Cardiothoracic Vascular Surgery)

## 2021-05-10 ENCOUNTER — Other Ambulatory Visit: Payer: Self-pay

## 2021-05-10 ENCOUNTER — Other Ambulatory Visit: Payer: Self-pay | Admitting: *Deleted

## 2021-05-10 ENCOUNTER — Encounter: Payer: Self-pay | Admitting: *Deleted

## 2021-05-10 VITALS — BP 133/79 | HR 81 | Resp 20 | Ht 60.0 in | Wt 104.0 lb

## 2021-05-10 DIAGNOSIS — I2510 Atherosclerotic heart disease of native coronary artery without angina pectoris: Secondary | ICD-10-CM

## 2021-05-10 DIAGNOSIS — I25119 Atherosclerotic heart disease of native coronary artery with unspecified angina pectoris: Secondary | ICD-10-CM

## 2021-05-14 ENCOUNTER — Encounter: Payer: Self-pay | Admitting: Medical

## 2021-05-14 DIAGNOSIS — H6121 Impacted cerumen, right ear: Secondary | ICD-10-CM

## 2021-05-14 NOTE — Telephone Encounter (Signed)
Dr. Lucia Gaskins is retiring soon, can you send ENT referral to [facility]? Thank you.

## 2021-05-30 NOTE — Progress Notes (Signed)
Surgical Instructions    Your procedure is scheduled on Tuesday, September 27th, 2022.   Report to [facility] Main Entrance "A" at 05:30 A.M., then check in with the Admitting office.  Call this number if you have problems the morning of surgery:  [phone]   If you have any questions prior to your surgery date call [phone]: Open Monday-Friday 8am-4pm    Remember:  Do not eat or drink after midnight the night before your surgery    Take these medicines the morning of surgery with A SIP OF WATER:  Colchicine Polyethyl Glycol-Propyl Glycol (SYSTANE OP) rosuvastatin (CRESTOR)   If needed:  nitrofurantoin, macrocrystal-monohydrate, (MACROBID) omeprazole (PRILOSEC)  nitroGLYCERIN (NITROSTAT) - let the nurse in short stay know if you take it  Follow your surgeon's instructions on when to stop Aspirin.  If no instructions were given by your surgeon then you will need to call the office to get those instructions.     As of today, STOP taking any Aspirin (unless otherwise instructed by your surgeon) Aleve, Naproxen, Ibuprofen, Motrin, Advil, Goody's, BC's, all herbal medications, fish oil, and all vitamins.           Do not wear jewelry or makeup Do not wear lotions, powders, perfumes, or deodorant. Do not shave 48 hours prior to surgery.   Do not bring valuables to the hospital. DO Not wear nail polish, gel polish, artificial nails, or any other type of covering on natural nails including finger and toenails. If patients have artificial nails, gel coating, etc. that need to be removed by a nail salon please have this removed prior to surgery or surgery may need to be canceled/delayed if the surgeon/ anesthesia feels like the patient is unable to be adequately monitored.              [facility] is not responsible for any belongings or valuables.  Do NOT Smoke (Tobacco/Vaping)  24 hours prior to your procedure If you use a CPAP at night, you may bring your mask for your  overnight stay.   Contacts, glasses, dentures or bridgework may not be worn into surgery, please bring cases for these belongings   For patients admitted to the hospital, discharge time will be determined by your treatment team.   Patients discharged the day of surgery will not be allowed to drive home, and someone needs to stay with them for 24 hours.  NO VISITORS WILL BE ALLOWED IN PRE-OP WHERE PATIENTS GET READY FOR SURGERY.  ONLY 1 SUPPORT PERSON MAY BE PRESENT WHILE YOU ARE IN SURGERY.  IF YOU ARE TO BE ADMITTED, ONCE YOU ARE IN YOUR ROOM YOU WILL BE ALLOWED TWO (2) VISITORS.  Minor children may have two parents present. Special consideration for safety and communication needs will be reviewed on a case by case basis.  Special instructions:    Oral Hygiene is also important to reduce your risk of infection.  Remember - BRUSH YOUR TEETH THE MORNING OF SURGERY WITH YOUR REGULAR TOOTHPASTE   [facility]- Preparing For Surgery  Before surgery, you can play an important role. Because skin is not sterile, your skin needs to be as free of germs as possible. You can reduce the number of germs on your skin by washing with CHG (chlorahexidine gluconate) Soap before surgery.  CHG is an antiseptic cleaner which kills germs and bonds with the skin to continue killing germs even after washing.     Please do not use if you have an  allergy to CHG or antibacterial soaps. If your skin becomes reddened/irritated stop using the CHG.  Do not shave (including legs and underarms) for at least 48 hours prior to first CHG shower. It is OK to shave your face.  Please follow these instructions carefully.     Shower the NIGHT BEFORE SURGERY and the MORNING OF SURGERY with CHG Soap.   If you chose to wash your hair, wash your hair first as usual with your normal shampoo. After you shampoo, rinse your hair and body thoroughly to remove the shampoo.  Then [employer] and genitals (private parts) with your normal soap  and rinse thoroughly to remove soap.  After that Use CHG Soap as you would any other liquid soap. You can apply CHG directly to the skin and wash gently with a scrungie or a clean washcloth.   Apply the CHG Soap to your body ONLY FROM THE NECK DOWN.  Do not use on open wounds or open sores. Avoid contact with your eyes, ears, mouth and genitals (private parts). Wash Face and genitals (private parts)  with your normal soap.   Wash thoroughly, paying special attention to the area where your surgery will be performed.  Thoroughly rinse your body with warm water from the neck down.  DO NOT shower/wash with your normal soap after using and rinsing off the CHG Soap.  Pat yourself dry with a CLEAN TOWEL.  Wear CLEAN PAJAMAS to bed the night before surgery  Place CLEAN SHEETS on your bed the night before your surgery  DO NOT SLEEP WITH PETS.   Day of Surgery:  Take a shower with CHG soap. Wear Clean/Comfortable clothing the morning of surgery Do not apply any deodorants/lotions.   Remember to brush your teeth WITH YOUR REGULAR TOOTHPASTE.   Please read over the following fact sheets that you were given.

## 2021-05-31 ENCOUNTER — Ambulatory Visit (HOSPITAL_COMMUNITY)
Admission: RE | Admit: 2021-05-31 | Discharge: 2021-05-31 | Disposition: A | Discharge status: Home / Self Care | Payer: Medicare Other | Source: Ambulatory Visit | Attending: Thoracic Surgery (Cardiothoracic Vascular Surgery) | Admitting: Thoracic Surgery (Cardiothoracic Vascular Surgery)

## 2021-05-31 ENCOUNTER — Other Ambulatory Visit: Payer: Self-pay

## 2021-05-31 ENCOUNTER — Encounter (HOSPITAL_COMMUNITY)
Admission: RE | Admit: 2021-05-31 | Discharge: 2021-05-31 | Disposition: A | Discharge status: Home / Self Care | Payer: Medicare Other | Source: Ambulatory Visit | Attending: Thoracic Surgery (Cardiothoracic Vascular Surgery) | Admitting: Thoracic Surgery (Cardiothoracic Vascular Surgery)

## 2021-05-31 ENCOUNTER — Encounter (HOSPITAL_COMMUNITY): Payer: Self-pay

## 2021-05-31 DIAGNOSIS — Z01818 Encounter for other preprocedural examination: Secondary | ICD-10-CM | POA: Diagnosis present

## 2021-05-31 DIAGNOSIS — Z79899 Other long term (current) drug therapy: Secondary | ICD-10-CM | POA: Diagnosis not present

## 2021-05-31 DIAGNOSIS — I2510 Atherosclerotic heart disease of native coronary artery without angina pectoris: Secondary | ICD-10-CM

## 2021-05-31 DIAGNOSIS — Z7982 Long term (current) use of aspirin: Secondary | ICD-10-CM | POA: Diagnosis not present

## 2021-05-31 DIAGNOSIS — A239 Brucellosis, unspecified: Secondary | ICD-10-CM | POA: Diagnosis not present

## 2021-05-31 DIAGNOSIS — E785 Hyperlipidemia, unspecified: Secondary | ICD-10-CM | POA: Diagnosis not present

## 2021-05-31 DIAGNOSIS — Z20822 Contact with and (suspected) exposure to covid-19: Secondary | ICD-10-CM | POA: Diagnosis not present

## 2021-05-31 HISTORY — DX: Atherosclerotic heart disease of native coronary artery without angina pectoris: I25.10

## 2021-05-31 HISTORY — DX: Other complications of anesthesia, initial encounter: T88.59XA

## 2021-05-31 HISTORY — DX: Other specified postprocedural states: Z98.890

## 2021-05-31 HISTORY — DX: Nausea with vomiting, unspecified: R11.2

## 2021-05-31 LAB — CBC
HCT: 49.2 % — ABNORMAL HIGH (ref 36.0–46.0)
Hemoglobin: 13.7 g/dL (ref 12.0–15.0)
MCH: 19.7 pg — ABNORMAL LOW (ref 26.0–34.0)
MCHC: 27.8 g/dL — ABNORMAL LOW (ref 30.0–36.0)
MCV: 70.6 fL — ABNORMAL LOW (ref 80.0–100.0)
Platelets: 434 10*3/uL — ABNORMAL HIGH (ref 150–400)
RBC: 6.97 MIL/uL — ABNORMAL HIGH (ref 3.87–5.11)
RDW: 23.8 % — ABNORMAL HIGH (ref 11.5–15.5)
WBC: 11.9 10*3/uL — ABNORMAL HIGH (ref 4.0–10.5)
nRBC: 0 % (ref 0.0–0.2)

## 2021-05-31 LAB — COMPREHENSIVE METABOLIC PANEL
ALT: 14 U/L (ref 0–44)
AST: 21 U/L (ref 15–41)
Albumin: 4.1 g/dL (ref 3.5–5.0)
Alkaline Phosphatase: 66 U/L (ref 38–126)
Anion gap: 7 (ref 5–15)
BUN: 8 mg/dL (ref 8–23)
CO2: 27 mmol/L (ref 22–32)
Calcium: 9.8 mg/dL (ref 8.9–10.3)
Chloride: 101 mmol/L (ref 98–111)
Creatinine, Ser: 0.72 mg/dL (ref 0.44–1.00)
GFR, Estimated: >60 mL/min (ref >60)
Glucose, Bld: 89 mg/dL (ref 70–99)
Potassium: 4.4 mmol/L (ref 3.5–5.1)
Sodium: 135 mmol/L (ref 135–145)
Total Bilirubin: 0.8 mg/dL (ref 0.3–1.2)
Total Protein: 7.8 g/dL (ref 6.5–8.1)

## 2021-05-31 LAB — URINALYSIS, ROUTINE W REFLEX MICROSCOPIC
Bilirubin Urine: NEGATIVE
Glucose, UA: NEGATIVE mg/dL
Hgb urine dipstick: NEGATIVE
Ketones, ur: NEGATIVE mg/dL
Leukocytes,Ua: NEGATIVE
Nitrite: NEGATIVE
Protein, ur: NEGATIVE mg/dL
Specific Gravity, Urine: 1.005 (ref 1.005–1.030)
pH: 6 (ref 5.0–8.0)

## 2021-05-31 LAB — HEMOGLOBIN A1C
Hgb A1c MFr Bld: 5.4 % (ref 4.8–5.6)
Mean Plasma Glucose: 108.28 mg/dL

## 2021-05-31 LAB — TYPE AND SCREEN
ABO/RH(D): A POS
Antibody Screen: NEGATIVE

## 2021-05-31 LAB — SARS CORONAVIRUS 2 (TAT 6-24 HRS): SARS Coronavirus 2: NEGATIVE

## 2021-05-31 LAB — PROTIME-INR
INR: 1.1 (ref 0.8–1.2)
Prothrombin Time: 14.1 seconds (ref 11.4–15.2)

## 2021-05-31 LAB — APTT: aPTT: 39 seconds — ABNORMAL HIGH (ref 24–36)

## 2021-05-31 LAB — SURGICAL PCR SCREEN
MRSA, PCR: NEGATIVE
Staphylococcus aureus: NEGATIVE

## 2021-05-31 NOTE — Progress Notes (Signed)
PCP - Madie Reno, PA-C Cardiologist - Dr. Eleonore Chiquito  PPM/ICD - Denies  Chest x-ray - 05/31/21 EKG - 05/31/21 Stress Test - 06/27/16 ECHO - 05/08/21 Cardiac Cath - 07/17/16  Sleep Study - Denies  Patient denies having diabetes.  Blood Thinner Instructions: Aspirin Instructions: Patient will stop ASA as of today 05/31/21.  ERAS Protcol - No  COVID TEST- 05/31/21 Done in PAT   Anesthesia review: Yes, cardiac hx  Patient denies shortness of breath, fever, cough and chest pain at PAT appointment   All instructions explained to the patient, with a verbal understanding of the material. Patient agrees to go over the instructions while at home for a better understanding. Patient also instructed to self quarantine after being tested for COVID-19. The opportunity to ask questions was provided.

## 2021-06-03 ENCOUNTER — Ambulatory Visit: Payer: Medicare Other | Admitting: Cardiovascular Disease

## 2021-06-03 ENCOUNTER — Encounter (HOSPITAL_COMMUNITY): Payer: Self-pay

## 2021-06-03 MED ORDER — TRANEXAMIC ACID (OHS) PUMP PRIME SOLUTION
2.0000 mg/kg | INTRAVENOUS | Status: DC
  Filled 2021-06-03: qty 0.96

## 2021-06-03 MED ORDER — HEPARIN 30,000 UNITS/1000 ML (OHS) CELLSAVER SOLUTION
Status: DC
  Filled 2021-06-03: qty 1000

## 2021-06-03 MED ORDER — INSULIN REGULAR(HUMAN) IN NACL 100-0.9 UT/100ML-% IV SOLN
INTRAVENOUS | Status: AC
  Administered 2021-06-04: 1.5 [IU]/h via INTRAVENOUS
  Filled 2021-06-03: qty 100

## 2021-06-03 MED ORDER — NITROGLYCERIN IN D5W 200-5 MCG/ML-% IV SOLN
2.0000 ug/min | INTRAVENOUS | Status: AC
  Administered 2021-06-04: 16.6 ug/min via INTRAVENOUS
  Filled 2021-06-03: qty 250

## 2021-06-03 MED ORDER — PHENYLEPHRINE HCL-NACL 20-0.9 MG/250ML-% IV SOLN
30.0000 ug/min | INTRAVENOUS | Status: AC
  Administered 2021-06-04: 15 ug/min via INTRAVENOUS
  Filled 2021-06-03: qty 250

## 2021-06-03 MED ORDER — DEXMEDETOMIDINE HCL IN NACL 400 MCG/100ML IV SOLN
0.1000 ug/kg/h | INTRAVENOUS | Status: AC
  Administered 2021-06-04: .4 ug/kg/h via INTRAVENOUS
  Filled 2021-06-03: qty 100

## 2021-06-03 MED ORDER — MANNITOL 20 % IV SOLN
INTRAVENOUS | Status: DC
  Filled 2021-06-03: qty 13

## 2021-06-03 MED ORDER — LEVOFLOXACIN IN D5W 500 MG/100ML IV SOLN
500.0000 mg | INTRAVENOUS | Status: AC
  Administered 2021-06-04: 500 mg via INTRAVENOUS
  Filled 2021-06-03: qty 100

## 2021-06-03 MED ORDER — TRANEXAMIC ACID 1000 MG/10ML IV SOLN
1.5000 mg/kg/h | INTRAVENOUS | Status: AC
  Administered 2021-06-04: 1.5 mg/kg/h via INTRAVENOUS
  Filled 2021-06-03: qty 25

## 2021-06-03 MED ORDER — VANCOMYCIN HCL IN DEXTROSE 1-5 GM/200ML-% IV SOLN
1000.0000 mg | INTRAVENOUS | Status: AC
  Administered 2021-06-04: 1000 mg via INTRAVENOUS
  Filled 2021-06-03: qty 200

## 2021-06-03 MED ORDER — POTASSIUM CHLORIDE 2 MEQ/ML IV SOLN
80.0000 meq | INTRAVENOUS | Status: DC
Start: 2021-06-04 — End: 2021-06-04
  Filled 2021-06-03: qty 40

## 2021-06-03 MED ORDER — NOREPINEPHRINE 4 MG/250ML-% IV SOLN
0.0000 ug/min | INTRAVENOUS | Status: DC
  Filled 2021-06-03: qty 250

## 2021-06-03 MED ORDER — PLASMA-LYTE A IV SOLN
INTRAVENOUS | Status: DC
  Filled 2021-06-03: qty 5

## 2021-06-03 MED ORDER — MILRINONE LACTATE IN DEXTROSE 20-5 MG/100ML-% IV SOLN
0.3000 ug/kg/min | INTRAVENOUS | Status: DC
  Filled 2021-06-03: qty 100

## 2021-06-03 MED ORDER — TRANEXAMIC ACID (OHS) BOLUS VIA INFUSION
15.0000 mg/kg | INTRAVENOUS | Status: AC
  Administered 2021-06-04: 717 mg via INTRAVENOUS
  Filled 2021-06-03: qty 717

## 2021-06-03 MED ORDER — EPINEPHRINE HCL 5 MG/250ML IV SOLN IN NS
0.0000 ug/min | INTRAVENOUS | Status: DC
  Filled 2021-06-03: qty 250

## 2021-06-03 NOTE — Progress Notes (Signed)
Anesthesia Chart Review:  Case: [id number] Date/Time: 06/04/21 0715   Procedures:      CORONARY ARTERY BYPASS GRAFTING (CABG) (Chest)     RADIAL ARTERY HARVEST (Left: Arm Lower)     TRANSESOPHAGEAL ECHOCARDIOGRAM (TEE)   Anesthesia type: General   Pre-op diagnosis: CAD   Location: [facility] OR ROOM 9 / [facility]   Surgeons: Lajuana Matte, MD       DISCUSSION: Patient is a 65 year old female scheduled for the above procedure.  History includes never smoker, post-operative N/V, CAD, HLD, Mediterranean fever (per ID notes, symptoms ~ age 46 years, reported diagnosis in [place] at age 6 and managed on colchicine), eating disorder, hysterectomy/salpingectomy (with A&P Repair 08/30/19).   05/31/2021 presurgical COVID-19 test negative.  Anesthesia team to evaluate on the day of surgery.   VS: BP 116/73   Pulse 73   Temp 36.8 C (Oral)   Resp 19   Ht 5\' 3"  (1.6 m)   Wt 47.8 kg   LMP  (LMP Unknown)   SpO2 100%   BMI 18.67 kg/m    PROVIDERS: [place], Iris Pert is PCP - Skeet Latch, MD is primary cardiologist. Last office visit with Eleonore Chiquito, MD on 04/15/21 for chest pain leading to cardiac cath with recommendation for CABG.  Lilyan Gilford, MD is ID [facility]). Consult 11/28/19. Refilled colchicine. Ongoing follow-up with PCP and rheumatologist.   LABS: Labs reviewed: Acceptable for surgery. (all labs ordered are listed, but only abnormal results are displayed)  Labs Reviewed  CBC - Abnormal; Notable for the following components:      Result Value   WBC 11.9 (*)    RBC 6.97 (*)    HCT 49.2 (*)    MCV 70.6 (*)    MCH 19.7 (*)    MCHC 27.8 (*)    RDW 23.8 (*)    Platelets 434 (*)    All other components within normal limits  APTT - Abnormal; Notable for the following components:   aPTT 39 (*)    All other components within normal limits  SURGICAL PCR SCREEN  SARS CORONAVIRUS 2 (TAT 6-24 HRS)  COMPREHENSIVE METABOLIC PANEL  PROTIME-INR  URINALYSIS, ROUTINE W  REFLEX MICROSCOPIC  HEMOGLOBIN A1C  TYPE AND SCREEN     IMAGES: CXR 05/31/21: FINDINGS: Lung volumes are normal. No consolidative airspace disease. No pleural effusions. No pneumothorax. No pulmonary nodule or mass noted. Pulmonary vasculature and the cardiomediastinal silhouette are within normal limits. IMPRESSION: No radiographic evidence of acute cardiopulmonary disease.   EKG: 05/31/21: NSR   CV: Carotid US 05/31/21: Summary:  Right Carotid: The extracranial vessels were near-normal with only minimal wall thickening or plaque.  Left Carotid: The extracranial vessels were near-normal with only minimal wall thickening or plaque.  Vertebrals:  Bilateral vertebral arteries demonstrate antegrade flow.  Subclavians: Normal flow hemodynamics were seen in bilateral subclavian arteries.    Echo 05/08/21: IMPRESSIONS   1. Left ventricular ejection fraction, by estimation, is 55 to 60%. The  left ventricle has normal function. The left ventricle demonstrates  regional wall motion abnormalities (see scoring diagram/findings for  description). Left ventricular diastolic  parameters are consistent with Grade I diastolic dysfunction (impaired  relaxation). The average left ventricular global longitudinal strain is  -12.6 %. The global longitudinal strain is abnormal.   2. Right ventricular systolic function is normal. The right ventricular  size is normal.   3. The mitral valve is normal in structure. No evidence of mitral valve  regurgitation. No  evidence of mitral stenosis.   4. The aortic valve is tricuspid. Aortic valve regurgitation is not  visualized. No aortic stenosis is present.   5. The inferior vena cava is normal in size with greater than 50%  respiratory variability, suggesting right atrial pressure of 3 mmHg.    Cardiac cath 04/22/21:   60% distal left main   40 to 50% proximal LAD followed by eccentric 70% stenosis in the proximal to mid segment.  First diagonal  contains segmental 70 to 80% proximal stenosis.   Circumflex is free of critical obstructive disease.   RCA contains total occlusion of the PDA and a left ventricular branch that is supplied by left to right collaterals.   LVEDP was normal at 4 mmHg.  LV function is normal with EF 65%.   Discussed with referring cardiologist, Dr. Eleonore Chiquito. Coronary bypass grafting appears to be the treatment of choice given coronary anatomy, young age, and otherwise good long-term prognosis. Will arrange outpatient surgical consultation.  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 71 years in [place], managed on colchicine   PONV (postoperative nausea and vomiting)     Past Surgical History:  Procedure Laterality Date   ABDOMINAL HYSTERECTOMY  08/2019   ANAL RECTAL MANOMETRY N/A 11/12/2020   Procedure: ANO RECTAL MANOMETRY;  Surgeon: Lavena Bullion, DO;  [facility];  Service: Gastroenterology;  Laterality: N/A;   BLADDER SURGERY  08/2019   BREAST REDUCTION SURGERY  2006   CARDIAC CATHETERIZATION N/A 07/17/2016   Procedure: Left Heart Cath and Coronary Angiography;  Surgeon: Burnell Blanks, MD;  Location: [facility];  Service: Cardiovascular;  Laterality: N/A;   CATARACT EXTRACTION W/ INTRAOCULAR LENS IMPLANT Bilateral    COLONOSCOPY  2015   EYE SURGERY     LEFT HEART CATH AND CORONARY ANGIOGRAPHY N/A 04/22/2021   Procedure: LEFT HEART CATH AND CORONARY ANGIOGRAPHY;  Surgeon: Belva Crome, MD;  Location: [facility];  Service: Cardiovascular;  Laterality: N/A;   POLYPECTOMY  2015   TONSILLECTOMY      MEDICATIONS:  aspirin EC 81 MG tablet   colchicine 0.6 MG tablet   Cranberry-Vitamin C 250-60 MG CAPS   D-MANNOSE PO   diclofenac (VOLTAREN) 75 MG EC tablet   diphenhydrAMINE (BENADRYL) 25 MG tablet    FIBER PO   linaclotide (LINZESS) 290 MCG CAPS capsule   nitrofurantoin, macrocrystal-monohydrate, (MACROBID) 100 MG capsule   nitroGLYCERIN (NITROSTAT) 0.4 MG SL tablet   NON FORMULARY   omeprazole (PRILOSEC) 40 MG capsule   Polyethyl Glycol-Propyl Glycol (SYSTANE OP)   potassium chloride SA (KLOR-CON) 20 MEQ tablet   PREMARIN vaginal cream   ranolazine (RANEXA) 500 MG 12 hr tablet   rosuvastatin (CRESTOR) 10 MG tablet   sodium polystyrene (KAYEXALATE) 15 GM/60ML suspension   No current facility-administered medications for this encounter.    Myra Gianotti, PA-C Surgical Short Stay/Anesthesiology [facility] Phone [phone] [facility] Phone [phone] 06/03/2021 10:13 AM

## 2021-06-03 NOTE — Anesthesia Preprocedure Evaluation (Addendum)
Anesthesia Evaluation  Patient identified by MRN, date of birth, ID band Patient awake    Reviewed: Allergy & Precautions, NPO status , Patient's Chart, lab work & pertinent test results  History of Anesthesia Complications (+) PONV and history of anesthetic complications  Airway Mallampati: II  TM Distance: >3 FB Neck ROM: Full    Dental  (+) Dental Advisory Given, Edentulous Upper, Edentulous Lower   Pulmonary neg pulmonary ROS,    Pulmonary exam normal breath sounds clear to auscultation       Cardiovascular + CAD  Normal cardiovascular exam Rhythm:Regular Rate:Normal  Echo 05/08/2021 1. Left ventricular ejection fraction, by estimation, is 55 to 60%. The left ventricle has normal function. The left ventricle demonstrates regional wall motion abnormalities (see scoring diagram/findings for description). Left ventricular diastolic parameters are consistent with Grade I diastolic dysfunction (impaired relaxation). The average left ventricular global longitudinal strain is -12.6 %. The global longitudinal strain is abnormal.  2. Right ventricular systolic function is normal. The right ventricular size is normal.  3. The mitral valve is normal in structure. No evidence of mitral valve regurgitation. No evidence of mitral stenosis.  4. The aortic valve is tricuspid. Aortic valve regurgitation is not visualized. No aortic stenosis is present.  5. The inferior vena cava is normal in size with greater than 50% respiratory variability, suggesting right atrial pressure of 3 mmHg.    Neuro/Psych PSYCHIATRIC DISORDERS negative neurological ROS     GI/Hepatic negative GI ROS, Neg liver ROS,   Endo/Other  negative endocrine ROS  Renal/GU negative Renal ROS     Musculoskeletal negative musculoskeletal ROS (+)   Abdominal   Peds  Hematology negative hematology ROS (+)   Anesthesia Other Findings   Reproductive/Obstetrics                             Anesthesia Physical Anesthesia Plan  ASA: 4  Anesthesia Plan: General   Post-op Pain Management:    Induction: Intravenous  PONV Risk Score and Plan: 4 or greater and Treatment may vary due to age or medical condition and Midazolam  Airway Management Planned: Oral ETT  Additional Equipment: Arterial line, CVP, TEE and Ultrasound Guidance Line Placement  Intra-op Plan:   Post-operative Plan: Post-operative intubation/ventilation  Informed Consent: I have reviewed the patients History and Physical, chart, labs and discussed the procedure including the risks, benefits and alternatives for the proposed anesthesia with the patient or authorized representative who has indicated his/her understanding and acceptance.     Dental advisory given  Plan Discussed with: CRNA  Anesthesia Plan Comments: (PAT note written 06/03/2021 by Myra Gianotti, PA-C. )      Anesthesia Quick Evaluation

## 2021-06-04 ENCOUNTER — Inpatient Hospital Stay (HOSPITAL_COMMUNITY): Payer: Medicare Other

## 2021-06-04 ENCOUNTER — Inpatient Hospital Stay (HOSPITAL_COMMUNITY)
Admission: RE | Disposition: A | Discharge status: Home / Self Care | Payer: Self-pay | Source: Home / Self Care | Attending: Thoracic Surgery (Cardiothoracic Vascular Surgery)

## 2021-06-04 ENCOUNTER — Other Ambulatory Visit: Payer: Self-pay

## 2021-06-04 ENCOUNTER — Inpatient Hospital Stay (HOSPITAL_COMMUNITY): Payer: Medicare Other | Admitting: Vascular Surgery

## 2021-06-04 ENCOUNTER — Encounter (HOSPITAL_COMMUNITY): Payer: Self-pay | Admitting: Thoracic Surgery (Cardiothoracic Vascular Surgery)

## 2021-06-04 ENCOUNTER — Inpatient Hospital Stay (HOSPITAL_COMMUNITY)
Admission: RE | Admit: 2021-06-04 | Discharge: 2021-06-09 | DRG: 236 | Disposition: A | Discharge status: Home / Self Care | Payer: Medicare Other | Attending: Thoracic Surgery (Cardiothoracic Vascular Surgery) | Admitting: Thoracic Surgery (Cardiothoracic Vascular Surgery)

## 2021-06-04 DIAGNOSIS — E8770 Fluid overload, unspecified: Secondary | ICD-10-CM | POA: Diagnosis not present

## 2021-06-04 DIAGNOSIS — R000 Tachycardia, unspecified: Secondary | ICD-10-CM | POA: Diagnosis not present

## 2021-06-04 DIAGNOSIS — D62 Acute posthemorrhagic anemia: Secondary | ICD-10-CM | POA: Diagnosis not present

## 2021-06-04 DIAGNOSIS — Z90710 Acquired absence of both cervix and uterus: Secondary | ICD-10-CM

## 2021-06-04 DIAGNOSIS — I10 Essential (primary) hypertension: Secondary | ICD-10-CM | POA: Diagnosis present

## 2021-06-04 DIAGNOSIS — E860 Dehydration: Secondary | ICD-10-CM | POA: Diagnosis present

## 2021-06-04 DIAGNOSIS — Z951 Presence of aortocoronary bypass graft: Secondary | ICD-10-CM

## 2021-06-04 DIAGNOSIS — J939 Pneumothorax, unspecified: Secondary | ICD-10-CM

## 2021-06-04 DIAGNOSIS — Z7982 Long term (current) use of aspirin: Secondary | ICD-10-CM | POA: Diagnosis not present

## 2021-06-04 DIAGNOSIS — Z09 Encounter for follow-up examination after completed treatment for conditions other than malignant neoplasm: Secondary | ICD-10-CM

## 2021-06-04 DIAGNOSIS — I2510 Atherosclerotic heart disease of native coronary artery without angina pectoris: Secondary | ICD-10-CM | POA: Diagnosis present

## 2021-06-04 DIAGNOSIS — E785 Hyperlipidemia, unspecified: Secondary | ICD-10-CM | POA: Diagnosis present

## 2021-06-04 DIAGNOSIS — Z8249 Family history of ischemic heart disease and other diseases of the circulatory system: Secondary | ICD-10-CM | POA: Diagnosis not present

## 2021-06-04 DIAGNOSIS — Z79899 Other long term (current) drug therapy: Secondary | ICD-10-CM | POA: Diagnosis not present

## 2021-06-04 DIAGNOSIS — J90 Pleural effusion, not elsewhere classified: Secondary | ICD-10-CM

## 2021-06-04 DIAGNOSIS — Z800 Family history of malignant neoplasm of digestive organs: Secondary | ICD-10-CM | POA: Diagnosis not present

## 2021-06-04 DIAGNOSIS — Z20822 Contact with and (suspected) exposure to covid-19: Secondary | ICD-10-CM | POA: Diagnosis present

## 2021-06-04 HISTORY — PX: CORONARY ARTERY BYPASS GRAFT: SHX141

## 2021-06-04 HISTORY — PX: RADIAL ARTERY HARVEST: SHX5067

## 2021-06-04 HISTORY — PX: TEE WITHOUT CARDIOVERSION: SHX5443

## 2021-06-04 LAB — CBC
HCT: 23.6 % — ABNORMAL LOW (ref 36.0–46.0)
HCT: 29.5 % — ABNORMAL LOW (ref 36.0–46.0)
Hemoglobin: 6.8 g/dL — CL (ref 12.0–15.0)
Hemoglobin: 8.7 g/dL — ABNORMAL LOW (ref 12.0–15.0)
MCH: 20.2 pg — ABNORMAL LOW (ref 26.0–34.0)
MCH: 20.4 pg — ABNORMAL LOW (ref 26.0–34.0)
MCHC: 28.8 g/dL — ABNORMAL LOW (ref 30.0–36.0)
MCHC: 29.5 g/dL — ABNORMAL LOW (ref 30.0–36.0)
MCV: 70.2 fL — ABNORMAL LOW (ref 80.0–100.0)
MCV: 69.2 fL — ABNORMAL LOW (ref 80.0–100.0)
Platelets: 240 10*3/uL (ref 150–400)
Platelets: 283 10*3/uL (ref 150–400)
RBC: 3.36 MIL/uL — ABNORMAL LOW (ref 3.87–5.11)
RBC: 4.26 MIL/uL (ref 3.87–5.11)
RDW: 21.7 % — ABNORMAL HIGH (ref 11.5–15.5)
RDW: 22.3 % — ABNORMAL HIGH (ref 11.5–15.5)
WBC: 11.8 10*3/uL — ABNORMAL HIGH (ref 4.0–10.5)
WBC: 14.4 10*3/uL — ABNORMAL HIGH (ref 4.0–10.5)
nRBC: 0 % (ref 0.0–0.2)
nRBC: 0 % (ref 0.0–0.2)

## 2021-06-04 LAB — BASIC METABOLIC PANEL
Anion gap: 4 — ABNORMAL LOW (ref 5–15)
BUN: 9 mg/dL (ref 8–23)
CO2: 24 mmol/L (ref 22–32)
Calcium: 7.7 mg/dL — ABNORMAL LOW (ref 8.9–10.3)
Chloride: 108 mmol/L (ref 98–111)
Creatinine, Ser: 0.72 mg/dL (ref 0.44–1.00)
GFR, Estimated: >60 mL/min (ref >60)
Glucose, Bld: 139 mg/dL — ABNORMAL HIGH (ref 70–99)
Potassium: 4.2 mmol/L (ref 3.5–5.1)
Sodium: 136 mmol/L (ref 135–145)

## 2021-06-04 LAB — POCT I-STAT 7, (LYTES, BLD GAS, ICA,H+H)
Acid-Base Excess: 0 mmol/L (ref 0.0–2.0)
Acid-Base Excess: 1 mmol/L (ref 0.0–2.0)
Acid-Base Excess: 0 mmol/L (ref 0.0–2.0)
Acid-Base Excess: 1 mmol/L (ref 0.0–2.0)
Acid-base deficit: 1 mmol/L (ref 0.0–2.0)
Acid-base deficit: 11 mmol/L — ABNORMAL HIGH (ref 0.0–2.0)
Acid-base deficit: 2 mmol/L (ref 0.0–2.0)
Acid-base deficit: 6 mmol/L — ABNORMAL HIGH (ref 0.0–2.0)
Acid-base deficit: 1 mmol/L (ref 0.0–2.0)
Bicarbonate: 23.4 mmol/L (ref 20.0–28.0)
Bicarbonate: 24.7 mmol/L (ref 20.0–28.0)
Bicarbonate: 26.6 mmol/L (ref 20.0–28.0)
Bicarbonate: 24.5 mmol/L (ref 20.0–28.0)
Bicarbonate: 23.8 mmol/L (ref 20.0–28.0)
Bicarbonate: 14.5 mmol/L — ABNORMAL LOW (ref 20.0–28.0)
Bicarbonate: 23.9 mmol/L (ref 20.0–28.0)
Bicarbonate: 18.6 mmol/L — ABNORMAL LOW (ref 20.0–28.0)
Bicarbonate: 24.4 mmol/L (ref 20.0–28.0)
Calcium, Ion: 1.12 mmol/L — ABNORMAL LOW (ref 1.15–1.40)
Calcium, Ion: 0.9 mmol/L — ABNORMAL LOW (ref 1.15–1.40)
Calcium, Ion: 1.01 mmol/L — ABNORMAL LOW (ref 1.15–1.40)
Calcium, Ion: 0.97 mmol/L — ABNORMAL LOW (ref 1.15–1.40)
Calcium, Ion: 1.27 mmol/L (ref 1.15–1.40)
Calcium, Ion: 0.91 mmol/L — ABNORMAL LOW (ref 1.15–1.40)
Calcium, Ion: 1.17 mmol/L (ref 1.15–1.40)
Calcium, Ion: 1.05 mmol/L — ABNORMAL LOW (ref 1.15–1.40)
Calcium, Ion: 1.14 mmol/L — ABNORMAL LOW (ref 1.15–1.40)
HCT: 34 % — ABNORMAL LOW (ref 36.0–46.0)
HCT: 26 % — ABNORMAL LOW (ref 36.0–46.0)
HCT: 26 % — ABNORMAL LOW (ref 36.0–46.0)
HCT: 24 % — ABNORMAL LOW (ref 36.0–46.0)
HCT: 26 % — ABNORMAL LOW (ref 36.0–46.0)
HCT: 19 % — ABNORMAL LOW (ref 36.0–46.0)
HCT: 26 % — ABNORMAL LOW (ref 36.0–46.0)
HCT: 21 % — ABNORMAL LOW (ref 36.0–46.0)
HCT: 28 % — ABNORMAL LOW (ref 36.0–46.0)
Hemoglobin: 11.6 g/dL — ABNORMAL LOW (ref 12.0–15.0)
Hemoglobin: 8.8 g/dL — ABNORMAL LOW (ref 12.0–15.0)
Hemoglobin: 8.8 g/dL — ABNORMAL LOW (ref 12.0–15.0)
Hemoglobin: 8.2 g/dL — ABNORMAL LOW (ref 12.0–15.0)
Hemoglobin: 8.8 g/dL — ABNORMAL LOW (ref 12.0–15.0)
Hemoglobin: 6.5 g/dL — CL (ref 12.0–15.0)
Hemoglobin: 8.8 g/dL — ABNORMAL LOW (ref 12.0–15.0)
Hemoglobin: 7.1 g/dL — ABNORMAL LOW (ref 12.0–15.0)
Hemoglobin: 9.5 g/dL — ABNORMAL LOW (ref 12.0–15.0)
O2 Saturation: 100 %
O2 Saturation: 100 %
O2 Saturation: 83 %
O2 Saturation: 100 %
O2 Saturation: 100 %
O2 Saturation: 99 %
O2 Saturation: 99 %
O2 Saturation: 98 %
O2 Saturation: 100 %
Patient temperature: 36
Patient temperature: 36.1
Patient temperature: 36.7
Patient temperature: 37.2
Potassium: 3.9 mmol/L (ref 3.5–5.1)
Potassium: 4.6 mmol/L (ref 3.5–5.1)
Potassium: 4.7 mmol/L (ref 3.5–5.1)
Potassium: 5.1 mmol/L (ref 3.5–5.1)
Potassium: 4.2 mmol/L (ref 3.5–5.1)
Potassium: 2.6 mmol/L — CL (ref 3.5–5.1)
Potassium: 4.1 mmol/L (ref 3.5–5.1)
Potassium: 3.5 mmol/L (ref 3.5–5.1)
Potassium: 4.3 mmol/L (ref 3.5–5.1)
Sodium: 140 mmol/L (ref 135–145)
Sodium: 138 mmol/L (ref 135–145)
Sodium: 139 mmol/L (ref 135–145)
Sodium: 136 mmol/L (ref 135–145)
Sodium: 139 mmol/L (ref 135–145)
Sodium: 141 mmol/L (ref 135–145)
Sodium: 147 mmol/L — ABNORMAL HIGH (ref 135–145)
Sodium: 143 mmol/L (ref 135–145)
Sodium: 139 mmol/L (ref 135–145)
TCO2: 24 mmol/L (ref 22–32)
TCO2: 26 mmol/L (ref 22–32)
TCO2: 28 mmol/L (ref 22–32)
TCO2: 26 mmol/L (ref 22–32)
TCO2: 25 mmol/L (ref 22–32)
TCO2: 15 mmol/L — ABNORMAL LOW (ref 22–32)
TCO2: 25 mmol/L (ref 22–32)
TCO2: 20 mmol/L — ABNORMAL LOW (ref 22–32)
TCO2: 26 mmol/L (ref 22–32)
pCO2 arterial: 35.3 mmHg (ref 32.0–48.0)
pCO2 arterial: 41.1 mmHg (ref 32.0–48.0)
pCO2 arterial: 44.2 mmHg (ref 32.0–48.0)
pCO2 arterial: 38.2 mmHg (ref 32.0–48.0)
pCO2 arterial: 31.1 mmHg — ABNORMAL LOW (ref 32.0–48.0)
pCO2 arterial: 28.5 mmHg — ABNORMAL LOW (ref 32.0–48.0)
pCO2 arterial: 45 mmHg (ref 32.0–48.0)
pCO2 arterial: 31.1 mmHg — ABNORMAL LOW (ref 32.0–48.0)
pCO2 arterial: 44 mmHg (ref 32.0–48.0)
pH, Arterial: 7.429 (ref 7.350–7.450)
pH, Arterial: 7.386 (ref 7.350–7.450)
pH, Arterial: 7.388 (ref 7.350–7.450)
pH, Arterial: 7.416 (ref 7.350–7.450)
pH, Arterial: 7.492 — ABNORMAL HIGH (ref 7.350–7.450)
pH, Arterial: 7.309 — ABNORMAL LOW (ref 7.350–7.450)
pH, Arterial: 7.329 — ABNORMAL LOW (ref 7.350–7.450)
pH, Arterial: 7.383 (ref 7.350–7.450)
pH, Arterial: 7.352 (ref 7.350–7.450)
pO2, Arterial: 451 mmHg — ABNORMAL HIGH (ref 83.0–108.0)
pO2, Arterial: 328 mmHg — ABNORMAL HIGH (ref 83.0–108.0)
pO2, Arterial: 48 mmHg — ABNORMAL LOW (ref 83.0–108.0)
pO2, Arterial: 227 mmHg — ABNORMAL HIGH (ref 83.0–108.0)
pO2, Arterial: 241 mmHg — ABNORMAL HIGH (ref 83.0–108.0)
pO2, Arterial: 166 mmHg — ABNORMAL HIGH (ref 83.0–108.0)
pO2, Arterial: 173 mmHg — ABNORMAL HIGH (ref 83.0–108.0)
pO2, Arterial: 97 mmHg (ref 83.0–108.0)
pO2, Arterial: 199 mmHg — ABNORMAL HIGH (ref 83.0–108.0)

## 2021-06-04 LAB — POCT I-STAT, CHEM 8
BUN: 9 mg/dL (ref 8–23)
BUN: 8 mg/dL (ref 8–23)
BUN: 7 mg/dL — ABNORMAL LOW (ref 8–23)
BUN: 7 mg/dL — ABNORMAL LOW (ref 8–23)
BUN: 7 mg/dL — ABNORMAL LOW (ref 8–23)
Calcium, Ion: 1.14 mmol/L — ABNORMAL LOW (ref 1.15–1.40)
Calcium, Ion: 1.26 mmol/L (ref 1.15–1.40)
Calcium, Ion: 1 mmol/L — ABNORMAL LOW (ref 1.15–1.40)
Calcium, Ion: 1.02 mmol/L — ABNORMAL LOW (ref 1.15–1.40)
Calcium, Ion: 1.29 mmol/L (ref 1.15–1.40)
Chloride: 107 mmol/L (ref 98–111)
Chloride: 103 mmol/L (ref 98–111)
Chloride: 103 mmol/L (ref 98–111)
Chloride: 102 mmol/L (ref 98–111)
Chloride: 106 mmol/L (ref 98–111)
Creatinine, Ser: 0.6 mg/dL (ref 0.44–1.00)
Creatinine, Ser: 0.6 mg/dL (ref 0.44–1.00)
Creatinine, Ser: 0.5 mg/dL (ref 0.44–1.00)
Creatinine, Ser: 0.6 mg/dL (ref 0.44–1.00)
Creatinine, Ser: 0.6 mg/dL (ref 0.44–1.00)
Glucose, Bld: 97 mg/dL (ref 70–99)
Glucose, Bld: 173 mg/dL — ABNORMAL HIGH (ref 70–99)
Glucose, Bld: 110 mg/dL — ABNORMAL HIGH (ref 70–99)
Glucose, Bld: 123 mg/dL — ABNORMAL HIGH (ref 70–99)
Glucose, Bld: 107 mg/dL — ABNORMAL HIGH (ref 70–99)
HCT: 35 % — ABNORMAL LOW (ref 36.0–46.0)
HCT: 32 % — ABNORMAL LOW (ref 36.0–46.0)
HCT: 26 % — ABNORMAL LOW (ref 36.0–46.0)
HCT: 26 % — ABNORMAL LOW (ref 36.0–46.0)
HCT: 27 % — ABNORMAL LOW (ref 36.0–46.0)
Hemoglobin: 11.9 g/dL — ABNORMAL LOW (ref 12.0–15.0)
Hemoglobin: 10.9 g/dL — ABNORMAL LOW (ref 12.0–15.0)
Hemoglobin: 8.8 g/dL — ABNORMAL LOW (ref 12.0–15.0)
Hemoglobin: 8.8 g/dL — ABNORMAL LOW (ref 12.0–15.0)
Hemoglobin: 9.2 g/dL — ABNORMAL LOW (ref 12.0–15.0)
Potassium: 3.9 mmol/L (ref 3.5–5.1)
Potassium: 3.9 mmol/L (ref 3.5–5.1)
Potassium: 4.7 mmol/L (ref 3.5–5.1)
Potassium: 4.9 mmol/L (ref 3.5–5.1)
Potassium: 4.2 mmol/L (ref 3.5–5.1)
Sodium: 140 mmol/L (ref 135–145)
Sodium: 136 mmol/L (ref 135–145)
Sodium: 137 mmol/L (ref 135–145)
Sodium: 136 mmol/L (ref 135–145)
Sodium: 138 mmol/L (ref 135–145)
TCO2: 23 mmol/L (ref 22–32)
TCO2: 26 mmol/L (ref 22–32)
TCO2: 25 mmol/L (ref 22–32)
TCO2: 26 mmol/L (ref 22–32)
TCO2: 22 mmol/L (ref 22–32)

## 2021-06-04 LAB — BLOOD GAS, ARTERIAL
Acid-base deficit: 0.9 mmol/L (ref 0.0–2.0)
Bicarbonate: 23.9 mmol/L (ref 20.0–28.0)
FIO2: 21
O2 Saturation: 99.1 %
Patient temperature: 37
pCO2 arterial: 44.7 mmHg (ref 32.0–48.0)
pH, Arterial: 7.349 — ABNORMAL LOW (ref 7.350–7.450)
pO2, Arterial: 186 mmHg — ABNORMAL HIGH (ref 83.0–108.0)

## 2021-06-04 LAB — GLUCOSE, CAPILLARY
Glucose-Capillary: 113 mg/dL — ABNORMAL HIGH (ref 70–99)
Glucose-Capillary: 83 mg/dL (ref 70–99)
Glucose-Capillary: 128 mg/dL — ABNORMAL HIGH (ref 70–99)
Glucose-Capillary: 109 mg/dL — ABNORMAL HIGH (ref 70–99)
Glucose-Capillary: 118 mg/dL — ABNORMAL HIGH (ref 70–99)
Glucose-Capillary: 113 mg/dL — ABNORMAL HIGH (ref 70–99)
Glucose-Capillary: 152 mg/dL — ABNORMAL HIGH (ref 70–99)

## 2021-06-04 LAB — ECHO INTRAOPERATIVE TEE
AV Mean grad: 3.3 mmHg
AV Peak grad: 5.7 mmHg
Ao pk vel: 1.2 m/s
Height: 63 in
Weight: 1664 oz

## 2021-06-04 LAB — HEMOGLOBIN AND HEMATOCRIT, BLOOD
HCT: 24.9 % — ABNORMAL LOW (ref 36.0–46.0)
Hemoglobin: 7.5 g/dL — ABNORMAL LOW (ref 12.0–15.0)

## 2021-06-04 LAB — PROTIME-INR
INR: 3.9 — ABNORMAL HIGH (ref 0.8–1.2)
INR: 1.5 — ABNORMAL HIGH (ref 0.8–1.2)
Prothrombin Time: 38.5 seconds — ABNORMAL HIGH (ref 11.4–15.2)
Prothrombin Time: 18.4 seconds — ABNORMAL HIGH (ref 11.4–15.2)

## 2021-06-04 LAB — MAGNESIUM: Magnesium: 3.5 mg/dL — ABNORMAL HIGH (ref 1.7–2.4)

## 2021-06-04 LAB — PLATELET COUNT: Platelets: 283 10*3/uL (ref 150–400)

## 2021-06-04 LAB — ABO/RH: ABO/RH(D): A POS

## 2021-06-04 LAB — APTT
aPTT: 57 seconds — ABNORMAL HIGH (ref 24–36)
aPTT: 32 seconds (ref 24–36)

## 2021-06-04 SURGERY — CORONARY ARTERY BYPASS GRAFTING (CABG)
Anesthesia: General | Site: Chest

## 2021-06-04 MED ORDER — ACETAMINOPHEN 500 MG PO TABS
1000.0000 mg | ORAL_TABLET | Freq: Four times a day (QID) | ORAL | Status: DC
  Administered 2021-06-04 – 2021-06-09 (×16): 1000 mg via ORAL
  Filled 2021-06-04 (×18): qty 2

## 2021-06-04 MED ORDER — FAMOTIDINE 20 MG IN NS 100 ML IVPB
20.0000 mg | Freq: Two times a day (BID) | INTRAVENOUS | Status: AC
  Administered 2021-06-04 (×2): 20 mg via INTRAVENOUS
  Filled 2021-06-04: qty 100

## 2021-06-04 MED ORDER — PANTOPRAZOLE SODIUM 40 MG PO TBEC
40.0000 mg | DELAYED_RELEASE_TABLET | Freq: Every day | ORAL | Status: DC
  Administered 2021-06-06 – 2021-06-09 (×4): 40 mg via ORAL
  Filled 2021-06-04 (×4): qty 1

## 2021-06-04 MED ORDER — ALBUMIN HUMAN 5 % IV SOLN
250.0000 mL | INTRAVENOUS | Status: DC | PRN
Start: 2021-06-04 — End: 2021-06-04

## 2021-06-04 MED ORDER — ALBUMIN HUMAN 25 % IV SOLN
12.5000 g | INTRAVENOUS | Status: DC | PRN
  Administered 2021-06-04: 12.5 g via INTRAVENOUS
  Filled 2021-06-04 (×3): qty 50

## 2021-06-04 MED ORDER — ORAL CARE MOUTH RINSE: 15.0000 mL | OROMUCOSAL | Status: DC

## 2021-06-04 MED ORDER — SODIUM CHLORIDE 0.9 % IV BOLUS: 250.0000 mL | Freq: Once | INTRAVENOUS | Status: DC

## 2021-06-04 MED ORDER — ACETAMINOPHEN 160 MG/5ML PO SOLN: 1000.0000 mg | Freq: Four times a day (QID) | ORAL | Status: DC

## 2021-06-04 MED ORDER — SUFENTANIL CITRATE 50 MCG/ML IV SOLN
INTRAVENOUS | Status: AC
  Filled 2021-06-04: qty 2

## 2021-06-04 MED ORDER — ONDANSETRON HCL 4 MG/2ML IJ SOLN
4.0000 mg | Freq: Four times a day (QID) | INTRAMUSCULAR | Status: DC | PRN
  Administered 2021-06-04 – 2021-06-07 (×7): 4 mg via INTRAVENOUS
  Filled 2021-06-04 (×7): qty 2

## 2021-06-04 MED ORDER — ~~LOC~~ CARDIAC SURGERY, PATIENT & FAMILY EDUCATION
Freq: Once | Status: DC
  Filled 2021-06-04: qty 1

## 2021-06-04 MED ORDER — DEXMEDETOMIDINE HCL IN NACL 400 MCG/100ML IV SOLN: 0.0000 ug/kg/h | INTRAVENOUS | Status: DC

## 2021-06-04 MED ORDER — LACTATED RINGERS IV SOLN: 500.0000 mL | Freq: Once | INTRAVENOUS | Status: DC | PRN

## 2021-06-04 MED ORDER — BISACODYL 5 MG PO TBEC
10.0000 mg | DELAYED_RELEASE_TABLET | Freq: Every day | ORAL | Status: DC
  Administered 2021-06-05 – 2021-06-09 (×4): 10 mg via ORAL
  Filled 2021-06-04 (×4): qty 2

## 2021-06-04 MED ORDER — SODIUM CHLORIDE 0.9% FLUSH
3.0000 mL | Freq: Two times a day (BID) | INTRAVENOUS | Status: DC
  Administered 2021-06-05 – 2021-06-09 (×6): 3 mL via INTRAVENOUS

## 2021-06-04 MED ORDER — ONDANSETRON HCL 4 MG/2ML IJ SOLN
INTRAMUSCULAR | Status: AC
  Filled 2021-06-04: qty 2

## 2021-06-04 MED ORDER — VECURONIUM BROMIDE 10 MG IV SOLR
INTRAVENOUS | Status: AC
  Filled 2021-06-04: qty 10

## 2021-06-04 MED ORDER — ACETAMINOPHEN 650 MG RE SUPP
650.0000 mg | Freq: Once | RECTAL | Status: AC
  Administered 2021-06-04: 650 mg via RECTAL

## 2021-06-04 MED ORDER — SODIUM CHLORIDE 0.45 % IV SOLN: INTRAVENOUS | Status: DC | PRN

## 2021-06-04 MED ORDER — SODIUM CHLORIDE 0.9% FLUSH: 3.0000 mL | INTRAVENOUS | Status: DC | PRN

## 2021-06-04 MED ORDER — ASPIRIN 81 MG PO CHEW: 324.0000 mg | CHEWABLE_TABLET | Freq: Every day | ORAL | Status: DC

## 2021-06-04 MED ORDER — DEXTROSE 50 % IV SOLN: 0.0000 mL | INTRAVENOUS | Status: DC | PRN

## 2021-06-04 MED ORDER — PROTAMINE SULFATE 10 MG/ML IV SOLN
INTRAVENOUS | Status: AC
  Filled 2021-06-04: qty 25

## 2021-06-04 MED ORDER — ORAL CARE MOUTH RINSE
15.0000 mL | Freq: Two times a day (BID) | OROMUCOSAL | Status: DC
  Administered 2021-06-04 – 2021-06-08 (×8): 15 mL via OROMUCOSAL

## 2021-06-04 MED ORDER — DOBUTAMINE IN D5W 4-5 MG/ML-% IV SOLN: 0.0000 ug/kg/min | INTRAVENOUS | Status: DC

## 2021-06-04 MED ORDER — LEVOFLOXACIN IN D5W 750 MG/150ML IV SOLN
750.0000 mg | INTRAVENOUS | Status: AC
Start: 2021-06-05 — End: 2021-06-05
  Administered 2021-06-05: 750 mg via INTRAVENOUS
  Filled 2021-06-04: qty 150

## 2021-06-04 MED ORDER — LACTATED RINGERS IV SOLN: INTRAVENOUS | Status: DC

## 2021-06-04 MED ORDER — METOPROLOL TARTRATE 12.5 MG HALF TABLET: 12.5000 mg | ORAL_TABLET | Freq: Once | ORAL | Status: AC

## 2021-06-04 MED ORDER — PHENYLEPHRINE HCL (PRESSORS) 10 MG/ML IV SOLN
INTRAVENOUS | Status: DC | PRN
  Administered 2021-06-04: 80 ug via INTRAVENOUS
  Administered 2021-06-04: 40 ug via INTRAVENOUS
  Administered 2021-06-04: 80 ug via INTRAVENOUS
  Administered 2021-06-04 (×2): 40 ug via INTRAVENOUS
  Administered 2021-06-04: 80 ug via INTRAVENOUS
  Administered 2021-06-04: 40 ug via INTRAVENOUS

## 2021-06-04 MED ORDER — NOREPINEPHRINE 4 MG/250ML-% IV SOLN: 0.0000 ug/min | INTRAVENOUS | Status: DC

## 2021-06-04 MED ORDER — SODIUM CHLORIDE 0.9 % IV SOLN: 250.0000 mL | INTRAVENOUS | Status: DC

## 2021-06-04 MED ORDER — CHLORHEXIDINE GLUCONATE 0.12 % MT SOLN
15.0000 mL | Freq: Two times a day (BID) | OROMUCOSAL | Status: DC
  Administered 2021-06-04 – 2021-06-09 (×9): 15 mL via OROMUCOSAL
  Filled 2021-06-04 (×9): qty 15

## 2021-06-04 MED ORDER — SODIUM CHLORIDE 0.9% FLUSH
10.0000 mL | Freq: Two times a day (BID) | INTRAVENOUS | Status: DC
  Administered 2021-06-05 (×2): 10 mL

## 2021-06-04 MED ORDER — ESTROGENS CONJUGATED 0.625 MG/GM VA CREA: 0.5000 g | TOPICAL_CREAM | VAGINAL | Status: DC

## 2021-06-04 MED ORDER — CHLORHEXIDINE GLUCONATE 0.12 % MT SOLN: 15.0000 mL | OROMUCOSAL | Status: AC

## 2021-06-04 MED ORDER — BISACODYL 10 MG RE SUPP: 10.0000 mg | Freq: Every day | RECTAL | Status: DC

## 2021-06-04 MED ORDER — ALBUMIN HUMAN 5 % IV SOLN
25.0000 g | Freq: Once | INTRAVENOUS | Status: AC
  Administered 2021-06-04: 25 g via INTRAVENOUS

## 2021-06-04 MED ORDER — NICARDIPINE HCL IN NACL 20-0.86 MG/200ML-% IV SOLN: 0.0000 mg/h | INTRAVENOUS | Status: DC

## 2021-06-04 MED ORDER — SUFENTANIL CITRATE 50 MCG/ML IV SOLN
INTRAVENOUS | Status: DC | PRN
  Administered 2021-06-04: 10 ug via INTRAVENOUS
  Administered 2021-06-04: 50 ug via INTRAVENOUS
  Administered 2021-06-04 (×3): 10 ug via INTRAVENOUS
  Administered 2021-06-04 (×2): 5 ug via INTRAVENOUS

## 2021-06-04 MED ORDER — MORPHINE SULFATE (PF) 2 MG/ML IV SOLN
1.0000 mg | INTRAVENOUS | Status: DC | PRN
  Administered 2021-06-04: 2 mg via INTRAVENOUS
  Administered 2021-06-04: 1 mg via INTRAVENOUS
  Filled 2021-06-04 (×2): qty 1

## 2021-06-04 MED ORDER — MIDAZOLAM HCL (PF) 10 MG/2ML IJ SOLN
INTRAMUSCULAR | Status: AC
  Filled 2021-06-04: qty 2

## 2021-06-04 MED ORDER — MAGNESIUM SULFATE 4 GM/100ML IV SOLN
4.0000 g | Freq: Once | INTRAVENOUS | Status: AC
  Administered 2021-06-04: 4 g via INTRAVENOUS
  Filled 2021-06-04: qty 100

## 2021-06-04 MED ORDER — VANCOMYCIN HCL IN DEXTROSE 1-5 GM/200ML-% IV SOLN
1000.0000 mg | Freq: Once | INTRAVENOUS | Status: AC
  Administered 2021-06-04: 1000 mg via INTRAVENOUS
  Filled 2021-06-04: qty 200

## 2021-06-04 MED ORDER — INSULIN REGULAR(HUMAN) IN NACL 100-0.9 UT/100ML-% IV SOLN: INTRAVENOUS | Status: DC

## 2021-06-04 MED ORDER — PROPOFOL 10 MG/ML IV BOLUS
INTRAVENOUS | Status: AC
  Filled 2021-06-04: qty 20

## 2021-06-04 MED ORDER — TRAMADOL HCL 50 MG PO TABS
50.0000 mg | ORAL_TABLET | ORAL | Status: DC | PRN
  Administered 2021-06-05: 100 mg via ORAL
  Administered 2021-06-05: 50 mg via ORAL
  Filled 2021-06-04: qty 2
  Filled 2021-06-04: qty 1

## 2021-06-04 MED ORDER — CHLORHEXIDINE GLUCONATE 0.12% ORAL RINSE (MEDLINE KIT)
15.0000 mL | Freq: Two times a day (BID) | OROMUCOSAL | Status: DC
  Administered 2021-06-04: 15 mL via OROMUCOSAL

## 2021-06-04 MED ORDER — OXYCODONE HCL 5 MG PO TABS
5.0000 mg | ORAL_TABLET | ORAL | Status: DC | PRN
  Administered 2021-06-04 – 2021-06-07 (×5): 10 mg via ORAL
  Filled 2021-06-04 (×5): qty 2

## 2021-06-04 MED ORDER — PROTAMINE SULFATE 10 MG/ML IV SOLN
INTRAVENOUS | Status: DC | PRN
  Administered 2021-06-04 (×2): 85 mg via INTRAVENOUS

## 2021-06-04 MED ORDER — MIDAZOLAM HCL 5 MG/5ML IJ SOLN
INTRAMUSCULAR | Status: DC | PRN
  Administered 2021-06-04 (×2): 1 mg via INTRAVENOUS
  Administered 2021-06-04: 2 mg via INTRAVENOUS
  Administered 2021-06-04: 4 mg via INTRAVENOUS
  Administered 2021-06-04: 2 mg via INTRAVENOUS

## 2021-06-04 MED ORDER — SODIUM CHLORIDE 0.9% FLUSH: 10.0000 mL | INTRAVENOUS | Status: DC | PRN

## 2021-06-04 MED ORDER — PROPOFOL 10 MG/ML IV BOLUS
INTRAVENOUS | Status: DC | PRN
  Administered 2021-06-04: 40 mg via INTRAVENOUS

## 2021-06-04 MED ORDER — ASPIRIN EC 325 MG PO TBEC
325.0000 mg | DELAYED_RELEASE_TABLET | Freq: Every day | ORAL | Status: DC
  Administered 2021-06-05 – 2021-06-09 (×5): 325 mg via ORAL
  Filled 2021-06-04 (×5): qty 1

## 2021-06-04 MED ORDER — SODIUM CHLORIDE (PF) 0.9 % IJ SOLN
OROMUCOSAL | Status: DC | PRN
  Administered 2021-06-04: 8 mL via TOPICAL

## 2021-06-04 MED ORDER — METOPROLOL TARTRATE 25 MG/10 ML ORAL SUSPENSION: 12.5000 mg | Freq: Two times a day (BID) | ORAL | Status: DC

## 2021-06-04 MED ORDER — ROSUVASTATIN CALCIUM 5 MG PO TABS
10.0000 mg | ORAL_TABLET | ORAL | Status: DC
  Administered 2021-06-05 – 2021-06-09 (×3): 10 mg via ORAL
  Filled 2021-06-04 (×4): qty 2

## 2021-06-04 MED ORDER — HEPARIN SODIUM (PORCINE) 1000 UNIT/ML IJ SOLN
INTRAMUSCULAR | Status: AC
  Filled 2021-06-04: qty 1

## 2021-06-04 MED ORDER — AMLODIPINE BESYLATE 5 MG PO TABS
2.5000 mg | ORAL_TABLET | Freq: Every day | ORAL | Status: DC
  Administered 2021-06-04 – 2021-06-09 (×6): 2.5 mg via ORAL
  Filled 2021-06-04 (×8): qty 1

## 2021-06-04 MED ORDER — METOPROLOL TARTRATE 12.5 MG HALF TABLET: 12.5000 mg | ORAL_TABLET | Freq: Two times a day (BID) | ORAL | Status: DC

## 2021-06-04 MED ORDER — HEPARIN SODIUM (PORCINE) 1000 UNIT/ML IJ SOLN
INTRAMUSCULAR | Status: DC | PRN
  Administered 2021-06-04: 17000 [IU] via INTRAVENOUS

## 2021-06-04 MED ORDER — CHLORHEXIDINE GLUCONATE 0.12 % MT SOLN
15.0000 mL | Freq: Once | OROMUCOSAL | Status: AC
  Administered 2021-06-04: 15 mL via OROMUCOSAL
  Filled 2021-06-04: qty 15

## 2021-06-04 MED ORDER — LACTATED RINGERS IV SOLN: INTRAVENOUS | Status: DC | PRN

## 2021-06-04 MED ORDER — ALBUMIN HUMAN 5 % IV SOLN: INTRAVENOUS | Status: DC | PRN

## 2021-06-04 MED ORDER — CHLORHEXIDINE GLUCONATE CLOTH 2 % EX PADS
6.0000 | MEDICATED_PAD | Freq: Every day | CUTANEOUS | Status: DC
  Administered 2021-06-04 – 2021-06-06 (×3): 6 via TOPICAL

## 2021-06-04 MED ORDER — ONDANSETRON HCL 4 MG/2ML IJ SOLN
INTRAMUSCULAR | Status: DC | PRN
  Administered 2021-06-04: 4 mg via INTRAVENOUS

## 2021-06-04 MED ORDER — INSULIN ASPART 100 UNIT/ML IJ SOLN
0.0000 [IU] | INTRAMUSCULAR | Status: DC
  Administered 2021-06-04 – 2021-06-05 (×3): 2 [IU] via SUBCUTANEOUS

## 2021-06-04 MED ORDER — CHLORHEXIDINE GLUCONATE 4 % EX LIQD: 30.0000 mL | CUTANEOUS | Status: DC

## 2021-06-04 MED ORDER — NITROGLYCERIN IN D5W 200-5 MCG/ML-% IV SOLN: 0.0000 ug/min | INTRAVENOUS | Status: DC

## 2021-06-04 MED ORDER — METOPROLOL TARTRATE 5 MG/5ML IV SOLN: 2.5000 mg | INTRAVENOUS | Status: DC | PRN

## 2021-06-04 MED ORDER — MIDAZOLAM HCL 2 MG/2ML IJ SOLN: 2.0000 mg | INTRAMUSCULAR | Status: DC | PRN

## 2021-06-04 MED ORDER — ACETAMINOPHEN 160 MG/5ML PO SOLN: 650.0000 mg | Freq: Once | ORAL | Status: AC

## 2021-06-04 MED ORDER — DOCUSATE SODIUM 100 MG PO CAPS
200.0000 mg | ORAL_CAPSULE | Freq: Every day | ORAL | Status: DC
  Administered 2021-06-05 – 2021-06-09 (×4): 200 mg via ORAL
  Filled 2021-06-04 (×5): qty 2

## 2021-06-04 MED ORDER — VECURONIUM BROMIDE 10 MG IV SOLR
INTRAVENOUS | Status: DC | PRN
  Administered 2021-06-04: 2 mg via INTRAVENOUS
  Administered 2021-06-04: 3 mg via INTRAVENOUS
  Administered 2021-06-04: 7 mg via INTRAVENOUS

## 2021-06-04 MED ORDER — SODIUM CHLORIDE 0.9 % IV SOLN: INTRAVENOUS | Status: DC

## 2021-06-04 MED ORDER — SODIUM CHLORIDE 0.9 % IV SOLN
12.5000 mg | Freq: Four times a day (QID) | INTRAVENOUS | Status: DC | PRN
  Administered 2021-06-04 – 2021-06-07 (×3): 12.5 mg via INTRAVENOUS
  Filled 2021-06-04 (×5): qty 0.5

## 2021-06-04 MED ORDER — METOPROLOL TARTRATE 12.5 MG HALF TABLET
ORAL_TABLET | ORAL | Status: AC
  Administered 2021-06-04: 12.5 mg via ORAL
  Filled 2021-06-04: qty 1

## 2021-06-04 MED ORDER — 0.9 % SODIUM CHLORIDE (POUR BTL) OPTIME
TOPICAL | Status: DC | PRN
  Administered 2021-06-04: 5000 mL

## 2021-06-04 MED ORDER — POTASSIUM CHLORIDE 10 MEQ/50ML IV SOLN
10.0000 meq | INTRAVENOUS | Status: AC
Start: 2021-06-04 — End: 2021-06-04

## 2021-06-04 MED ORDER — PLASMA-LYTE A IV SOLN
INTRAVENOUS | Status: DC | PRN
  Administered 2021-06-04: 1000 mL

## 2021-06-04 SURGICAL SUPPLY — 96 items
APPLIER CLIP 9.375 SM OPEN (CLIP) ×4
BAG DECANTER FOR FLEXI CONT (MISCELLANEOUS) ×4 IMPLANT
BLADE CLIPPER SURG (BLADE) ×8 IMPLANT
BLADE STERNUM SYSTEM 6 (BLADE) ×4 IMPLANT
BLADE SURG 15 STRL LF DISP TIS (BLADE) ×3 IMPLANT
BLADE SURG 15 STRL SS (BLADE) ×1
BNDG ELASTIC 4X5.8 VLCR STR LF (GAUZE/BANDAGES/DRESSINGS) ×8 IMPLANT
BNDG ELASTIC 6X5.8 VLCR STR LF (GAUZE/BANDAGES/DRESSINGS) ×4 IMPLANT
BNDG GAUZE ELAST 4 BULKY (GAUZE/BANDAGES/DRESSINGS) ×8 IMPLANT
CABLE SURGICAL S-101-97-12 (CABLE) ×4 IMPLANT
CANISTER SUCT 3000ML PPV (MISCELLANEOUS) ×4 IMPLANT
CANNULA MC2 2 STG 29/37 NON-V (CANNULA) ×3 IMPLANT
CANNULA MC2 TWO STAGE (CANNULA) ×1
CANNULA NON VENT 20FR 12 (CANNULA) ×4 IMPLANT
CATH ROBINSON RED A/P 18FR (CATHETERS) ×12 IMPLANT
CLIP APPLIE 9.375 SM OPEN (CLIP) ×3 IMPLANT
CLIP VESOCCLUDE MED 24/CT (CLIP) IMPLANT
CLIP VESOCCLUDE SM WIDE 24/CT (CLIP) IMPLANT
CONN ST 1/2X1/2  BEN (MISCELLANEOUS) ×1
CONN ST 1/2X1/2 BEN (MISCELLANEOUS) ×3 IMPLANT
CONNECTOR BLAKE 2:1 CARIO BLK (MISCELLANEOUS) ×4 IMPLANT
CONTAINER PROTECT SURGISLUSH (MISCELLANEOUS) ×8 IMPLANT
COVER MAYO STAND STRL (DRAPES) ×4 IMPLANT
CUFF TOURN SGL QUICK 18X4 (TOURNIQUET CUFF) IMPLANT
CUFF TOURN SGL QUICK 24 (TOURNIQUET CUFF)
CUFF TRNQT CYL 24X4X16.5-23 (TOURNIQUET CUFF) IMPLANT
DRAIN CHANNEL 19F RND (DRAIN) ×8 IMPLANT
DRAPE CARDIOVASCULAR INCISE (DRAPES) ×1
DRAPE EXTREMITY T 121X128X90 (DISPOSABLE) ×4 IMPLANT
DRAPE HALF SHEET 40X57 (DRAPES) ×4 IMPLANT
DRAPE INCISE IOBAN 66X45 STRL (DRAPES) IMPLANT
DRAPE SRG 135X102X78XABS (DRAPES) ×3 IMPLANT
DRAPE WARM FLUID 44X44 (DRAPES) ×4 IMPLANT
DRESSING AQUACEL AG SP 3.5X10 (GAUZE/BANDAGES/DRESSINGS) ×3 IMPLANT
DRSG AQUACEL AG ADV 3.5X10 (GAUZE/BANDAGES/DRESSINGS) ×4 IMPLANT
DRSG AQUACEL AG SP 3.5X10 (GAUZE/BANDAGES/DRESSINGS) ×4
DRSG COVADERM 4X10 (GAUZE/BANDAGES/DRESSINGS) ×4 IMPLANT
DRSG COVADERM 4X14 (GAUZE/BANDAGES/DRESSINGS) IMPLANT
ELECT BLADE 4.0 EZ CLEAN MEGAD (MISCELLANEOUS) ×4
ELECT REM PT RETURN 9FT ADLT (ELECTROSURGICAL) ×8
ELECTRODE BLDE 4.0 EZ CLN MEGD (MISCELLANEOUS) ×3 IMPLANT
ELECTRODE REM PT RTRN 9FT ADLT (ELECTROSURGICAL) ×6 IMPLANT
FELT TEFLON 1X6 (MISCELLANEOUS) ×4 IMPLANT
GAUZE SPONGE 4X4 12PLY STRL (GAUZE/BANDAGES/DRESSINGS) ×8 IMPLANT
GAUZE SPONGE 4X4 12PLY STRL LF (GAUZE/BANDAGES/DRESSINGS) ×12 IMPLANT
GEL ULTRASOUND 20GR AQUASONIC (MISCELLANEOUS) ×4 IMPLANT
GLOVE SURG ENC MOIS LTX SZ7 (GLOVE) ×8 IMPLANT
GLOVE SURG ENC TEXT LTX SZ7.5 (GLOVE) ×8 IMPLANT
GOWN STRL REUS W/ TWL LRG LVL3 (GOWN DISPOSABLE) ×12 IMPLANT
GOWN STRL REUS W/ TWL XL LVL3 (GOWN DISPOSABLE) ×6 IMPLANT
GOWN STRL REUS W/TWL LRG LVL3 (GOWN DISPOSABLE) ×4
GOWN STRL REUS W/TWL XL LVL3 (GOWN DISPOSABLE) ×2
HEMOSTAT POWDER SURGIFOAM 1G (HEMOSTASIS) ×8 IMPLANT
INSERT SUTURE HOLDER (MISCELLANEOUS) ×4 IMPLANT
KIT BASIN OR (CUSTOM PROCEDURE TRAY) ×4 IMPLANT
KIT SUCTION CATH 14FR (SUCTIONS) ×4 IMPLANT
KIT TURNOVER KIT B (KITS) ×8 IMPLANT
KIT VASOVIEW HEMOPRO 2 VH 4000 (KITS) ×4 IMPLANT
LEAD PACING MYOCARDI (MISCELLANEOUS) ×4 IMPLANT
MARKER GRAFT CORONARY BYPASS (MISCELLANEOUS) ×12 IMPLANT
NS IRRIG 1000ML POUR BTL (IV SOLUTION) ×20 IMPLANT
PACK ACCESSORY CANNULA KIT (KITS) ×4 IMPLANT
PACK E OPEN HEART (SUTURE) ×4 IMPLANT
PACK OPEN HEART (CUSTOM PROCEDURE TRAY) ×4 IMPLANT
PAD ARMBOARD 7.5X6 YLW CONV (MISCELLANEOUS) ×16 IMPLANT
PAD ELECT DEFIB RADIOL ZOLL (MISCELLANEOUS) ×4 IMPLANT
PENCIL BUTTON HOLSTER BLD 10FT (ELECTRODE) ×4 IMPLANT
POSITIONER HEAD DONUT 9IN (MISCELLANEOUS) ×4 IMPLANT
PUNCH AORTIC ROTATE 4.0MM (MISCELLANEOUS) ×4 IMPLANT
SET MPS 3-ND DEL (MISCELLANEOUS) ×4 IMPLANT
SHEARS HARMONIC 9CM CVD (BLADE) ×4 IMPLANT
SUPPORT HEART JANKE-BARRON (MISCELLANEOUS) ×4 IMPLANT
SUT BONE WAX W31G (SUTURE) ×4 IMPLANT
SUT ETHIBOND X763 2 0 SH 1 (SUTURE) ×8 IMPLANT
SUT MNCRL AB 3-0 PS2 18 (SUTURE) ×8 IMPLANT
SUT PDS AB 1 CTX 36 (SUTURE) ×8 IMPLANT
SUT PROLENE 4 0 SH DA (SUTURE) ×16 IMPLANT
SUT PROLENE 5 0 C 1 36 (SUTURE) ×20 IMPLANT
SUT PROLENE 7 0 BV 1 (SUTURE) ×4 IMPLANT
SUT PROLENE 7 0 BV1 MDA (SUTURE) ×8 IMPLANT
SUT STEEL 6MS V (SUTURE) ×8 IMPLANT
SUT VIC AB 2-0 CT1 27 (SUTURE) ×2
SUT VIC AB 2-0 CT1 TAPERPNT 27 (SUTURE) ×6 IMPLANT
SUT VIC AB 3-0 SH 27 (SUTURE)
SUT VIC AB 3-0 SH 27X BRD (SUTURE) IMPLANT
SUT VIC AB 3-0 X1 27 (SUTURE) IMPLANT
SYR 50ML SLIP (SYRINGE) IMPLANT
SYSTEM SAHARA CHEST DRAIN ATS (WOUND CARE) ×4 IMPLANT
TAPE CLOTH SURG 4X10 WHT LF (GAUZE/BANDAGES/DRESSINGS) ×4 IMPLANT
TAPE PAPER 2X10 WHT MICROPORE (GAUZE/BANDAGES/DRESSINGS) ×4 IMPLANT
TOWEL GREEN STERILE (TOWEL DISPOSABLE) ×8 IMPLANT
TOWEL GREEN STERILE FF (TOWEL DISPOSABLE) ×8 IMPLANT
TRAY FOLEY SLVR 14FR TEMP STAT (SET/KITS/TRAYS/PACK) ×4 IMPLANT
TUBING LAP HI FLOW INSUFFLATIO (TUBING) ×4 IMPLANT
UNDERPAD 30X36 HEAVY ABSORB (UNDERPADS AND DIAPERS) ×8 IMPLANT
WATER STERILE IRR 1000ML POUR (IV SOLUTION) ×8 IMPLANT

## 2021-06-04 NOTE — Anesthesia Procedure Notes (Signed)
Arterial Line Insertion Start/End9/27/2022 7:10 AM, 06/04/2021 7:20 AM Performed by: Moshe Salisbury, CRNA, CRNA  Patient location: Pre-op. Preanesthetic checklist: patient identified, IV checked, site marked, risks and benefits discussed, surgical consent, monitors and equipment checked, pre-op evaluation, timeout performed and anesthesia consent Lidocaine 1% used for infiltration and patient sedated Right, radial was placed Catheter size: 20 G Hand hygiene performed , maximum sterile barriers used  and Seldinger technique used  Attempts: 1 Procedure performed without using ultrasound guided technique. Following insertion, dressing applied and Biopatch. Post procedure assessment: normal  Patient tolerated the procedure well with no immediate complications.

## 2021-06-04 NOTE — Transfer of Care (Signed)
Immediate Anesthesia Transfer of Care Note  Patient: Taleah Bellantoni  Procedure(s) Performed: CORONARY ARTERY BYPASS GRAFTING (CABG), ON PUMP, TIMES FOUR, USING LEFT INTERNAL MAMMARY ARTERY, LEFT RADIAL ARTERY (HARVESTED OPEN), AND RIGHT GREATER SAPHENOUS VEIN (HARVESTED ENDOSCOPICALLY) (Chest) RADIAL ARTERY HARVEST (Left: Arm Lower) TRANSESOPHAGEAL ECHOCARDIOGRAM (TEE)  Patient Location: ICU  Anesthesia Type:General  Level of Consciousness: unresponsive and Patient remains intubated per anesthesia plan  Airway & Oxygen Therapy: Patient remains intubated per anesthesia plan and Patient placed on Ventilator (see vital sign flow sheet for setting)  Post-op Assessment: Report given to RN, Post -op Vital signs reviewed and stable and Patient moving all extremities  Post vital signs: Reviewed and stable  Last Vitals:  Vitals Value Taken Time  BP    Temp    Pulse    Resp    SpO2      Last Pain:  Vitals:   06/04/21 0617  TempSrc:   PainSc: 0-No pain         Complications: No notable events documented.

## 2021-06-04 NOTE — Interval H&P Note (Signed)
History and Physical Interval Note:  06/04/2021 7:07 AM  Katrina Walters  has presented today for surgery, with the diagnosis of CAD.  The various methods of treatment have been discussed with the patient and family. After consideration of risks, benefits and other options for treatment, the patient has consented to  Procedure(s): CORONARY ARTERY BYPASS GRAFTING (CABG) (N/A) RADIAL ARTERY HARVEST (Left) TRANSESOPHAGEAL ECHOCARDIOGRAM (TEE) (N/A) as a surgical intervention.  The patient's history has been reviewed, patient examined, no change in status, stable for surgery.  I have reviewed the patient's chart and labs.  Questions were answered to the patient's satisfaction.     Ailen Strauch Bary Leriche

## 2021-06-04 NOTE — Progress Notes (Addendum)
  Echocardiogram Echocardiogram transesophagael has been performed.  Katrina Walters 06/04/2021, 8:18 AM

## 2021-06-04 NOTE — Progress Notes (Signed)
Patient's arterial line very positional and difficult to pull back blood on. First set of labs and first ABG results incorrect. Labs redrawn.

## 2021-06-04 NOTE — Hospital Course (Addendum)
  History of Present Illness:     Katrina Walters is a 65 year old female is referred by Dr. Davina Poke for surgical evaluation of three-vessel coronary artery disease.  The patient states that that she has been having anginal symptoms and exertional dyspnea for at least 4 to 5 years.  Her anginal symptoms have been getting worse over the last several months.  She has a strong family history of coronary artery disease, and that her mother died in her 37s from a heart attack, and her father and uncle had open heart surgery in their 71s.   Currently she endorses some exertional dyspnea and anginal symptoms with ambulation.  This resolves after taking a break.  She denies any lower extremity swelling.  She has occasionally awoken from sleep with some sharp chest pain and palpitations.  Patient underwent cardiac catheterization which revealed severe multivessel coronary artery disease.  She was seen in cardiothoracic surgical consultation by Dr. Kipp Brood who recommended coronary artery bypass grafting as her best revascularization option due to the severity of the anatomical findings and increasing nature of her symptoms.  Hospital course: The patient was admitted electively and on 06/04/2021 she was taken to the operating room at which time she underwent coronary artery bypass grafting x4 by Dr. Kipp Brood.  She tolerated procedure well and was taken to the surgical intensive care unit in stable condition.  She was extubated the evening of surgery.  She did not require any drips post operatively.  Her arterial lines and foley catheter were removed on POD #1.  She did developed paraesthesias in her left arm from her radial artery site, which should improve with time.  Her chest tube output remained low and chest tubes were removed.  She was maintaining NSR and felt stable for transfer to the progressive care unit on 06/05/2021;however, a bed was not available until 06/06/2021. 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  As discussed with pharmacist, she is to be on 0.6 mg bid Colchicine at discharge. She was struggling with sputum production and Mucinex was initiated. All dressings were removed on 09/30. All wounds are clean, dry, and healing without signs of infection. LUE motor/sensory remained intact. Her HGA1C is 5.4 and she has no history of diabetes. Accu checks and SS PRN were stopped. She did have an upset stomach on 09/30. She was given scheduled doses of Reglan and Sorbitol to assist with bowel movement. She has been tolerating a diet. She is ambulating on room air with good oxygenation.  She did have some sinus tachycardia and beta-blocker was adjusted.  She had a syncopal episode after uptitrating her metoprolol dose to 37.5 mg twice daily.  It was subsequently resumed back at 25 mg twice daily.  Also was felt she may have been a little bit dehydrated and Lasix was discontinued.  This event was unique and did not recur.  She is felt surgically stable for discharge today.

## 2021-06-04 NOTE — Anesthesia Postprocedure Evaluation (Signed)
Anesthesia Post Note  Patient: Katrina Walters  Procedure(s) Performed: CORONARY ARTERY BYPASS GRAFTING (CABG), ON PUMP, TIMES FOUR, USING LEFT INTERNAL MAMMARY ARTERY, LEFT RADIAL ARTERY (HARVESTED OPEN), AND RIGHT GREATER SAPHENOUS VEIN (HARVESTED ENDOSCOPICALLY) (Chest) RADIAL ARTERY HARVEST (Left: Arm Lower) TRANSESOPHAGEAL ECHOCARDIOGRAM (TEE)     Patient location during evaluation: SICU Anesthesia Type: General Level of consciousness: sedated and patient remains intubated per anesthesia plan Pain management: pain level controlled Vital Signs Assessment: post-procedure vital signs reviewed and stable Respiratory status: patient on ventilator - see flowsheet for VS and patient remains intubated per anesthesia plan Cardiovascular status: stable Anesthetic complications: no   No notable events documented.  Last Vitals:  Vitals:   06/04/21 1345 06/04/21 1400  BP: 116/65 116/65  Pulse: 79 78  Resp: 14 15  Temp: (!) 36.1 C (!) 36.2 C  SpO2: 100% 100%    Last Pain:  Vitals:   06/04/21 0617  TempSrc:   PainSc: 0-No pain                 Nolon Nations

## 2021-06-04 NOTE — Progress Notes (Signed)
Roddenberry PA at bedside. Notified of NIF -15 and no cuff leak as well as ABG results. Orders received to extubate. Patient extubated at 1530 and is very stable.

## 2021-06-04 NOTE — Op Note (Signed)
[address]       [place] [address]             [phone]                                          06/04/2021 Patient:  Katrina Walters Pre-Op Dx: Three-vessel coronary artery disease   Hypertension Post-op Dx: Same Procedure: CABG X 4, LIMA LAD, reverse saphenous vein graft to PDA, reverse saphenous vein graft to obtuse marginal, reverse saphenous vein graft to diagonal branch Endoscopic greater saphenous vein harvest on the right Left radial artery harvest   Surgeon and Role:      * Zaara Sprowl, Lucile Crater, MD - Primary    Evonnie Pat, PA-C- assisting Assistant: Macarthur Critchley, PA-C  Anesthesia  general EBL: 500 ml Blood Administration: None   Drains: 82 F Trueba drain: L, mediastinal  Wires: None Counts: correct   Indications: 65 year old female with three-vessel coronary artery disease.  On echocardiogram she has preserved left ventricular function and no significant valvular disease.  On review of her left heart cath she has good targets on the LAD and diagonal branch.  The PDA fills from left to right collaterals.  She also has a left main stenosis with good targets on the circumflex.  We discussed the risks and benefits of surgical revascularization and she is agreeable to proceed.  We will also harvest the left radial artery.  She is tentatively scheduled for 27 September.  I informed her that if her symptoms worsen, or progress that she must come to the emergency department for further evaluation.  Findings: Small LIMA, good vein conduit.  Good distal targets.  Operative Technique: All invasive lines were placed in pre-op holding.  After the risks, benefits and alternatives were thoroughly discussed, the patient was brought to the operative theatre.  Anesthesia was induced, and the patient was prepped and draped in normal sterile fashion.  An appropriate surgical pause was performed, and pre-operative antibiotics were dosed accordingly.  We began with  simultaneous incisions along the right leg for harvesting of the greater saphenous vein and the left arm for harvesting of the radial artery.  In regards to the sternotomy, this was carried down with bovie cautery, and the sternum was divided with a reciprocating saw.  Meticulous hemostasis was obtained.  The left internal thoracic artery was exposed and harvested in in pedicled fashion.  The patient was systemically heparinized, and the artery was divided distally, and placed in a papaverine sponge.    The sternal elevator was removed, and a retractor was placed.  The pericardium was divided in the midline and fashioned into a cradle with pericardial stitches.   After we confirmed an appropriate ACT, the ascending aorta was cannulated in standard fashion.  The right atrial appendage was used for venous cannulation site.  Cardiopulmonary bypass was initiated, and the heart retractor was placed. The cross clamp was applied, and a dose of anterograde cardioplegia was given with good arrest of the heart.  We moved to the posterior wall of the heart, and found a good target on the PDA.  An arteriotomy was made, and the vein graft was anastomosed to it in an end to side fashion.  Next we exposed the lateral wall, and found a good target on the obtuse marginal.  An end to side anastomosis with the  v radial artery graft was then created.  Next, we exposed the anterior wall of the heart and identified a good target on diagonal.   An arteriotomy was created.  The vein was anastomosed in an end to side fashion.  Finally, we exposed a good target on the LAD, and fashioned an end to side anastomosis between it and the LITA.  We began to re-warm, and a re-animation dose of cardioplegia was given.  The heart was de-aired, and the cross clamp was removed.  Meticulous hemostasis was obtained.    A partial occludding clamp was then placed on the ascending aorta, and we created an end to side anastomosis between it and the  proximal vein grafts.  The proximal sites were marked with rings.  The radial artery was jumped off the hood of the diagonal vein graft.  Hemostasis was obtained, and we separated from cardiopulmonary bypass without event.  The heparin was reversed with protamine.  Chest tubes and wires were placed, and the sternum was re-approximated with sternal wires.  The soft tissue and skin were re-approximated wth absorbable suture.    The patient tolerated the procedure without any immediate complications, and was transferred to the ICU in guarded condition.  Dymphna Wadley Bary Leriche

## 2021-06-04 NOTE — Progress Notes (Signed)
[address]       [place] [address]             [phone]      Day of Surgery  Procedure(s) (LRB): CORONARY ARTERY BYPASS GRAFTING (CABG), ON PUMP, TIMES FOUR, USING LEFT INTERNAL MAMMARY ARTERY, LEFT RADIAL ARTERY (HARVESTED OPEN), AND RIGHT GREATER SAPHENOUS VEIN (HARVESTED ENDOSCOPICALLY) (N/A) RADIAL ARTERY HARVEST (Left) TRANSESOPHAGEAL ECHOCARDIOGRAM (TEE) (N/A)   Total Length of Stay:  LOS: 0 days    SUBJECTIVE: Early post-op. Still intubated but awake and cooperative.  Respiratory mechanics acceptable.   Vitals:   06/04/21 1445 06/04/21 1448  BP: (!) 104/59   Pulse: 75 81  Resp: 14 15  Temp: 97.7 F (36.5 C) 97.7 F (36.5 C)  SpO2: 100% 100%    Intake/Output      09/26 0701 09/27 0700 09/27 0701 09/28 0700   I.V. (mL/kg)  2259.7 (47.9)   Blood  250   IV Piggyback  681.7   Total Intake(mL/kg)  3191.4 (67.6)   Urine (mL/kg/hr)  1535 (3.9)   Blood  530   Chest Tube  60   Total Output  2125   Net  +1066.4            sodium chloride Stopped (06/04/21 1331)   [START ON 06/05/2021] sodium chloride     sodium chloride     albumin human     And   sodium chloride     dexmedetomidine (PRECEDEX) IV infusion 0.5 mcg/kg/hr (06/04/21 1400)   DOBUTamine     insulin 0.4 Units/hr (06/04/21 1400)   lactated ringers     lactated ringers     lactated ringers 20 mL/hr at 06/04/21 1400   [START ON 06/05/2021] levofloxacin (LEVAQUIN) IV     magnesium sulfate 20 mL/hr at 06/04/21 1400   niCARDipine     nitroGLYCERIN 5 mcg/min (06/04/21 1302)   norepinephrine (LEVOPHED) Adult infusion     potassium chloride     vancomycin      CBC    Component Value Date/Time   WBC 11.8 (H) 06/04/2021 1225   RBC 3.36 (L) 06/04/2021 1225   HGB 6.8 (LL) 06/04/2021 1225   HGB 13.1 04/15/2021 1027   HCT 23.6 (L) 06/04/2021 1225   HCT 45.4 04/15/2021 1027   PLT 240 06/04/2021 1225   PLT 441 04/15/2021 1027   MCV 70.2 (L) 06/04/2021 1225   MCV 68 (L)  04/15/2021 1027   MCH 20.2 (L) 06/04/2021 1225   MCHC 28.8 (L) 06/04/2021 1225   RDW 21.7 (H) 06/04/2021 1225   RDW 21.6 (H) 04/15/2021 1027   LYMPHSABS 2.0 09/22/2018 1152   MONOABS 0.7 09/22/2018 1152   EOSABS 0.3 09/22/2018 1152   BASOSABS 0.1 09/22/2018 1152   CMP     Component Value Date/Time   NA 138 06/04/2021 1152   NA 139 04/15/2021 1027   K 4.2 06/04/2021 1152   CL 106 06/04/2021 1152   CO2 27 05/31/2021 1200   GLUCOSE 107 (H) 06/04/2021 1152   BUN 7 (L) 06/04/2021 1152   BUN 14 04/15/2021 1027   CREATININE 0.60 06/04/2021 1152   CREATININE 0.84 07/14/2016 0914   CALCIUM 9.8 05/31/2021 1200   PROT 7.8 05/31/2021 1200   PROT 7.3 06/28/2020 1150   ALBUMIN 4.1 05/31/2021 1200   ALBUMIN 4.4 06/28/2020 1150   AST 21 05/31/2021 1200   ALT 14 05/31/2021 1200   ALKPHOS 66 05/31/2021 1200   BILITOT  0.8 05/31/2021 1200   BILITOT 0.4 06/28/2020 1150   GFRNONAA >60 05/31/2021 1200   GFRAA 88 06/28/2020 1150   ABG    Component Value Date/Time   PHART 7.492 (H) 06/04/2021 1142   PCO2ART 31.1 (L) 06/04/2021 1142   PO2ART 241 (H) 06/04/2021 1142   HCO3 23.8 06/04/2021 1142   TCO2 22 06/04/2021 1152   ACIDBASEDEF 1.0 06/04/2021 0817   O2SAT 100.0 06/04/2021 1142   CBG (last 3)  Recent Labs    06/04/21 1300 06/04/21 1402 06/04/21 1451  GLUCAP 113* 128* 109*     ASSESSMENT:  Stable cardiac rhythm and hemodynamics. Minimal CT output.  ABG and respiratory mechanics satisfactory. Extubate per protocol.   Antony Odea, PA-C

## 2021-06-04 NOTE — Anesthesia Procedure Notes (Signed)
Central Venous Catheter Insertion Performed by: Nolon Nations, MD, anesthesiologist Start/End9/27/2022 7:00 AM, 06/04/2021 7:18 AM Patient location: Pre-op. Preanesthetic checklist: patient identified, IV checked, site marked, risks and benefits discussed, surgical consent, monitors and equipment checked, pre-op evaluation, timeout performed and anesthesia consent Position: Trendelenburg Lidocaine 1% used for infiltration and patient sedated Hand hygiene performed  and maximum sterile barriers used  Catheter size: 8.5 Fr Sheath introducer Procedure performed using ultrasound guided technique. Ultrasound Notes:anatomy identified, needle tip was noted to be adjacent to the nerve/plexus identified, no ultrasound evidence of intravascular and/or intraneural injection and image(s) printed for medical record Attempts: 1 Following insertion, line sutured, dressing applied and Biopatch. Post procedure assessment: blood return through all ports, free fluid flow and no air  Patient tolerated the procedure well with no immediate complications. Additional procedure comments: Triple lumen "SLIC" catheter placed through introducer port.[name]

## 2021-06-04 NOTE — Brief Op Note (Signed)
06/04/2021  12:49 PM  PATIENT:  Katrina Walters  65 y.o. female  PRE-OPERATIVE DIAGNOSIS:  Coronary Artery Disease  POST-OPERATIVE DIAGNOSIS:  Coronary Artery Disease  PROCEDURE:  Procedure(s): CORONARY ARTERY BYPASS GRAFTING (CABG), ON PUMP, TIMES FOUR, USING LEFT INTERNAL MAMMARY ARTERY, LEFT RADIAL ARTERY (HARVESTED OPEN), AND RIGHT GREATER SAPHENOUS VEIN (HARVESTED ENDOSCOPICALLY) (N/A) RADIAL ARTERY HARVEST (Left) TRANSESOPHAGEAL ECHOCARDIOGRAM (TEE) (N/A) LIMA-LAD LEFT RADIAL -OM SVG-PD SVG-DIAG EVH- 55 MINUTES LEFT RADIAL-30 MINUTES  SURGEON:  Surgeon(s) and Role:    * Lightfoot, Lucile Crater, MD - Primary  PHYSICIAN ASSISTANT: Sharisa Toves PA-C, MYRON RODDENBERRY PA-C  ASSISTANTS: STAFF   ANESTHESIA:   general  EBL:  530 mL   BLOOD ADMINISTERED:none  DRAINS:  LEFT PLEURAL AND MEDIASTINAL CHEST TUBES    LOCAL MEDICATIONS USED:  NONE  SPECIMEN:  No Specimen  DISPOSITION OF SPECIMEN:  N/A  COUNTS:  YES  TOURNIQUET:  * No tourniquets in log *  DICTATION: .Dragon Dictation  PLAN OF CARE: Admit to inpatient   PATIENT DISPOSITION:  ICU - intubated and hemodynamically stable.   Delay start of Pharmacological VTE agent (>24hrs) due to surgical blood loss or risk of bleeding: yes  COMPLICATIONS: NO KNOWN

## 2021-06-04 NOTE — Progress Notes (Addendum)
Patient's initial ABG upon arrival to the ICU was slightly acidotic. Rayna Sexton MD and received orders to let the respiratory therapist change vent settings. Called RT and vent rate was changed to 14. Will continue to monitor throughout shift.

## 2021-06-04 NOTE — Procedures (Signed)
Extubation Procedure Note  Patient Details:   Name: Katrina Walters DOB: [birth year] MRN: [id number]   Airway Documentation:    Vent end date: 06/04/21 Vent end time: 1528   Evaluation  O2 sats: stable throughout Complications: No apparent complications Patient did tolerate procedure well. Bilateral Breath Sounds: Clear, Diminished   Pt extubated per rapid wean protocol. NIF was -15, VC 700 ml. Pt did not have positive cuff leak. PA Enid Cutter notified and ordered to extubate. Pt able to voice her name. No stridor noted.  Vilinda Blanks 06/04/2021, 3:29 PM

## 2021-06-04 NOTE — Anesthesia Procedure Notes (Signed)
Procedure Name: Intubation Date/Time: 06/04/2021 8:00 AM Performed by: Moshe Salisbury, CRNA Pre-anesthesia Checklist: Patient identified, Emergency Drugs available, Suction available and Patient being monitored Patient Re-evaluated:Patient Re-evaluated prior to induction Oxygen Delivery Method: Circle System Utilized Preoxygenation: Pre-oxygenation with 100% oxygen Induction Type: IV induction Ventilation: Mask ventilation without difficulty Laryngoscope Size: Mac and 3 Grade View: Grade I Tube type: Oral Tube size: 8.0 mm Number of attempts: 1 Airway Equipment and Method: Stylet Placement Confirmation: ETT inserted through vocal cords under direct vision, positive ETCO2 and breath sounds checked- equal and bilateral Secured at: 21 cm Tube secured with: Tape Dental Injury: Teeth and Oropharynx as per pre-operative assessment

## 2021-06-05 ENCOUNTER — Encounter (HOSPITAL_COMMUNITY): Payer: Self-pay | Admitting: Thoracic Surgery (Cardiothoracic Vascular Surgery)

## 2021-06-05 ENCOUNTER — Inpatient Hospital Stay (HOSPITAL_COMMUNITY): Payer: Medicare Other

## 2021-06-05 ENCOUNTER — Telehealth: Payer: Self-pay | Admitting: *Deleted

## 2021-06-05 LAB — BASIC METABOLIC PANEL
Anion gap: 6 (ref 5–15)
Anion gap: 4 — ABNORMAL LOW (ref 5–15)
BUN: 7 mg/dL — ABNORMAL LOW (ref 8–23)
BUN: 8 mg/dL (ref 8–23)
CO2: 23 mmol/L (ref 22–32)
CO2: 24 mmol/L (ref 22–32)
Calcium: 7.3 mg/dL — ABNORMAL LOW (ref 8.9–10.3)
Calcium: 7.3 mg/dL — ABNORMAL LOW (ref 8.9–10.3)
Chloride: 105 mmol/L (ref 98–111)
Chloride: 102 mmol/L (ref 98–111)
Creatinine, Ser: 0.73 mg/dL (ref 0.44–1.00)
Creatinine, Ser: 0.77 mg/dL (ref 0.44–1.00)
GFR, Estimated: >60 mL/min (ref >60)
GFR, Estimated: >60 mL/min (ref >60)
Glucose, Bld: 107 mg/dL — ABNORMAL HIGH (ref 70–99)
Glucose, Bld: 110 mg/dL — ABNORMAL HIGH (ref 70–99)
Potassium: 4 mmol/L (ref 3.5–5.1)
Potassium: 4.3 mmol/L (ref 3.5–5.1)
Sodium: 134 mmol/L — ABNORMAL LOW (ref 135–145)
Sodium: 130 mmol/L — ABNORMAL LOW (ref 135–145)

## 2021-06-05 LAB — GLUCOSE, CAPILLARY
Glucose-Capillary: 104 mg/dL — ABNORMAL HIGH (ref 70–99)
Glucose-Capillary: 102 mg/dL — ABNORMAL HIGH (ref 70–99)
Glucose-Capillary: 152 mg/dL — ABNORMAL HIGH (ref 70–99)
Glucose-Capillary: 78 mg/dL (ref 70–99)
Glucose-Capillary: 96 mg/dL (ref 70–99)
Glucose-Capillary: 135 mg/dL — ABNORMAL HIGH (ref 70–99)
Glucose-Capillary: 85 mg/dL (ref 70–99)

## 2021-06-05 LAB — CBC
HCT: 28.1 % — ABNORMAL LOW (ref 36.0–46.0)
HCT: 29 % — ABNORMAL LOW (ref 36.0–46.0)
Hemoglobin: 8.3 g/dL — ABNORMAL LOW (ref 12.0–15.0)
Hemoglobin: 8.5 g/dL — ABNORMAL LOW (ref 12.0–15.0)
MCH: 20.2 pg — ABNORMAL LOW (ref 26.0–34.0)
MCH: 20.4 pg — ABNORMAL LOW (ref 26.0–34.0)
MCHC: 29.5 g/dL — ABNORMAL LOW (ref 30.0–36.0)
MCHC: 29.3 g/dL — ABNORMAL LOW (ref 30.0–36.0)
MCV: 68.5 fL — ABNORMAL LOW (ref 80.0–100.0)
MCV: 69.5 fL — ABNORMAL LOW (ref 80.0–100.0)
Platelets: 296 10*3/uL (ref 150–400)
Platelets: 344 10*3/uL (ref 150–400)
RBC: 4.1 MIL/uL (ref 3.87–5.11)
RBC: 4.17 MIL/uL (ref 3.87–5.11)
RDW: 22.5 % — ABNORMAL HIGH (ref 11.5–15.5)
RDW: 22.7 % — ABNORMAL HIGH (ref 11.5–15.5)
WBC: 12.6 10*3/uL — ABNORMAL HIGH (ref 4.0–10.5)
WBC: 16 10*3/uL — ABNORMAL HIGH (ref 4.0–10.5)
nRBC: 0 % (ref 0.0–0.2)
nRBC: 0 % (ref 0.0–0.2)

## 2021-06-05 LAB — MAGNESIUM
Magnesium: 2.3 mg/dL (ref 1.7–2.4)
Magnesium: 2.3 mg/dL (ref 1.7–2.4)

## 2021-06-05 MED ORDER — COLCHICINE 0.3 MG HALF TABLET
0.3000 mg | ORAL_TABLET | Freq: Every day | ORAL | Status: DC
  Administered 2021-06-05: 0.3 mg via ORAL
  Filled 2021-06-05 (×3): qty 1

## 2021-06-05 MED ORDER — ENOXAPARIN SODIUM 40 MG/0.4ML IJ SOSY: 40.0000 mg | PREFILLED_SYRINGE | Freq: Every day | INTRAMUSCULAR | Status: DC

## 2021-06-05 MED ORDER — COLCHICINE 0.6 MG PO TABS: 0.6000 mg | ORAL_TABLET | Freq: Two times a day (BID) | ORAL | Status: DC

## 2021-06-05 MED ORDER — INFLUENZA VAC SPLIT QUAD 0.5 ML IM SUSY: 0.5000 mL | PREFILLED_SYRINGE | INTRAMUSCULAR | Status: DC | PRN

## 2021-06-05 MED ORDER — INSULIN ASPART 100 UNIT/ML IJ SOLN
0.0000 [IU] | INTRAMUSCULAR | Status: DC
  Administered 2021-06-06: 2 [IU] via SUBCUTANEOUS

## 2021-06-05 MED ORDER — GUAIFENESIN ER 600 MG PO TB12
600.0000 mg | ORAL_TABLET | Freq: Two times a day (BID) | ORAL | Status: DC
  Administered 2021-06-05 – 2021-06-09 (×8): 600 mg via ORAL
  Filled 2021-06-05 (×8): qty 1

## 2021-06-05 MED ORDER — METOPROLOL TARTRATE 25 MG PO TABS
25.0000 mg | ORAL_TABLET | Freq: Two times a day (BID) | ORAL | Status: DC
  Administered 2021-06-05 – 2021-06-07 (×5): 25 mg via ORAL
  Filled 2021-06-05 (×6): qty 1

## 2021-06-05 NOTE — Telephone Encounter (Signed)
-----   Message from Ledora Bottcher, [place] sent at 06/05/2021 10:36 AM EDT ----- Pt is scheduled with Krista on 06/18/21 at 11:15.  TOC - will need a phone call once discharged by [facility].  Thanks Angie      ----- Message ----- From: Marcene Corning Sent: 06/05/2021   9:43 AM EDT To: Candis Schatz  S/p cabg     9/27

## 2021-06-05 NOTE — Discharge Summary (Signed)
[address]       [place] [address]             [phone]    Physician Discharge Summary  Patient ID: Katrina Walters MRN: [id number] DOB/AGE: 1955/11/03 65 y.o.  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 Davina Walters for surgical evaluation of three-vessel coronary artery disease.  The patient states that that she has been having anginal symptoms and exertional dyspnea for at least 4 to 5 years.  Her anginal symptoms have been getting worse over the last several months.  She has a strong family history of coronary artery disease, and that her mother died in her 45s from a heart attack, and her father and uncle had open heart surgery in their 82s.   Currently she endorses some exertional dyspnea and anginal symptoms with ambulation.  This resolves after taking a break.  She denies any lower extremity swelling.  She has occasionally awoken from sleep with some sharp chest pain and palpitations.  Patient underwent cardiac catheterization which  revealed severe multivessel coronary artery disease.  She was seen in cardiothoracic surgical consultation by Dr. Kipp Walters who recommended coronary artery bypass grafting as her best revascularization option due to the severity of the anatomical findings and increasing nature of her symptoms.  Hospital course: The patient was admitted electively and on 06/04/2021 she was taken to the operating room at which time she underwent coronary artery bypass grafting x4 by Dr. Kipp Walters.  She tolerated procedure well and was taken to the surgical intensive care unit in stable condition.  She was extubated the evening of surgery.  She did not require any drips post operatively.  Her arterial lines and foley catheter were removed on POD #1.  She did developed paraesthesias in her left arm from her radial artery site, which should improve with time.  Her chest tube output remained low and chest tubes were removed.  She was maintaining NSR and felt stable for transfer to the progressive care unit on 06/05/2021;however, a bed was not available until 06/06/2021. She was restarted on Colchicine 0.3 mg bid for her history of Mediterranean fever.  As discussed with pharmacist, she is to be on 0.6 mg bid Colchicine at discharge. She was struggling with sputum production and Mucinex was initiated. All dressings were removed on 09/30. All wounds are clean, dry, and healing without signs of infection. LUE motor/sensory remained intact. Her HGA1C is 5.4 and she has no history of diabetes. Accu checks and  SS PRN were stopped. She did have an upset stomach on 09/30. She was given scheduled doses of Reglan and Sorbitol to assist with bowel movement. She has been tolerating a diet. She is ambulating on room air with good oxygenation.  She did have some sinus tachycardia and beta-blocker was adjusted.  She had a syncopal episode after uptitrating her metoprolol dose to 37.5 mg twice daily.  It was subsequently resumed back at 25 mg twice  daily.  Also was felt she may have been a little bit dehydrated and Lasix was discontinued.  This event was unique and did not recur.  She is felt surgically stable for discharge today.   Consults: None  Significant Diagnostic Studies: angiography:     60% distal left main   40 to 50% proximal LAD followed by eccentric 70% stenosis in the proximal to mid segment.  First diagonal contains segmental 70 to 80% proximal stenosis.   Circumflex is free of critical obstructive disease.   RCA contains total occlusion of the PDA and a left ventricular branch that is supplied by left to right collaterals.   LVEDP was normal at 4 mmHg.  LV function is normal with EF 65%.   Discussed with referring cardiologist, Dr. Eleonore Walters. Coronary bypass grafting appears to be the treatment of choice given coronary anatomy, young age, and otherwise good long-term prognosis. Will arrange outpatient surgical consultation.  Treatments:  CABG X 4, LIMA LAD, reverse saphenous vein graft to PDA, reverse saphenous vein graft to obtuse marginal, reverse saphenous vein graft to diagonal branch Endoscopic greater saphenous vein harvest on the right Left radial artery harvest by Dr. Kipp Walters on 06/04/2021.   Discharge Exam: Blood pressure 113/82, pulse 87, temperature 98.2 F (36.8 C), temperature source Oral, resp. rate 20, height 5\' 3"  (1.6 m), weight 45.6 kg, SpO2 100 %.  General appearance: alert, cooperative, and no distress Heart: regular rate and rhythm Lungs: coarse, improves with cough Abdomen: benign Extremities: no edema Wound: incis healing well    Discharge Medications:  The patient has been discharged on:   1.Beta Blocker:  Yes [ x  ]                              No   [   ]                              If No, reason:  2.Ace Inhibitor/ARB: Yes [   ]                                     No  [  n  ]                                     If No, reason: Labile BP  3.Statin:   Yes [  x ]                   No  [   ]                  If No, reason:  4.Ecasa:  Yes  [  x ]  No   [   ]                  If No, reason:  Patient had ACS upon admission:  Plavix/P2Y12 inhibitor: Yes [   ]                                      No  [ X  ]     Discharge Instructions     Amb Referral to Cardiac Rehabilitation   Complete by: As directed    Diagnosis: CABG   CABG X ___: 4   After initial evaluation and assessments completed: Virtual Based Care may be provided alone or in conjunction with Phase 2 Cardiac Rehab based on patient barriers.: Yes   Discharge patient   Complete by: As directed    Discharge disposition: 01-Home or Self Care   Discharge patient date: 06/09/2021      Allergies as of 06/09/2021       Reactions   Amoxicillin    Had fever with flu like symptoms. Has patient had a PCN reaction causing immediate rash, facial/tongue/throat swelling, SOB or lightheadedness with hypotension: no Has patient had a PCN reaction causing severe rash involving mucus membranes or skin necrosis: no Has patient had a PCN reaction that required hospitalization no Has patient had a PCN reaction occurring within the last 10 years: yes If all of the above answers are "NO", then may proceed with Cephalosporin use.   Clopidogrel & Aspirin Itching   Atorvastatin    Myalgia   Crestor [rosuvastatin] Itching   Metoprolol Other (See Comments)   ?possibly caused hair loss   Pravastatin Itching        Medication List     STOP taking these medications    diclofenac 75 MG EC tablet Commonly known as: VOLTAREN   nitroGLYCERIN 0.4 MG SL tablet Commonly known as: NITROSTAT   potassium chloride SA 20 MEQ tablet Commonly known as: KLOR-CON   ranolazine 500 MG 12 hr tablet Commonly known as: Ranexa   sodium polystyrene 15 GM/60ML suspension Commonly known as: KAYEXALATE       TAKE these medications    amLODipine 2.5 MG tablet Commonly known as: NORVASC Take 1  tablet (2.5 mg total) by mouth daily.   aspirin 325 MG EC tablet Take 1 tablet (325 mg total) by mouth daily. What changed:  medication strength how much to take   colchicine 0.6 MG tablet Take 1 tablet (0.6 mg total) by mouth 2 (two) times daily.   Cranberry-Vitamin C 250-60 MG Caps Take 2 tablets by mouth daily.   D-MANNOSE PO Take 2 tablets by mouth daily.   diphenhydrAMINE 25 MG tablet Commonly known as: BENADRYL Take 25 mg by mouth at bedtime.   FIBER PO Take 2 tablets by mouth daily.   linaclotide 290 MCG Caps capsule Commonly known as: LINZESS Take 290 mcg by mouth daily before breakfast.   metoprolol tartrate 25 MG tablet Commonly known as: LOPRESSOR Take 1 tablet (25 mg total) by mouth 2 (two) times daily.   nitrofurantoin (macrocrystal-monohydrate) 100 MG capsule Commonly known as: MACROBID Take 100 mg by mouth daily as needed (after intercourse).   NON FORMULARY Take 0.5 tablets by mouth at bedtime. CBD oil - for pain Gummies   omeprazole 40 MG capsule Commonly known as: PRILOSEC TAKE 1 CAPSULE BY MOUTH EVERY DAY What changed:  how  much to take when to take this reasons to take this   Premarin vaginal cream Generic drug: conjugated estrogens Place 1.79 Applicatorfuls vaginally 2 (two) times a week. What changed:  how much to take when to take this additional instructions   rosuvastatin 10 MG tablet Commonly known as: CRESTOR Take 10 mg by mouth every other day.   SYSTANE OP Place 1 drop into both eyes in the morning and at bedtime.   traMADol 50 MG tablet Commonly known as: ULTRAM Take 1 tablet (50 mg total) by mouth every 6 (six) hours as needed for up to 7 days for moderate pain.        Follow-up Information     Lajuana Matte, MD Follow up on 06/21/2021.   Specialty: Cardiothoracic Surgery Why: This is a TELEPHONE appoinment. Please do NOT come to the office. Dr. Kipp Walters will call you at 3:35 pm on 06/21/2021. Contact  information: [place] [address] [phone]         Abigail Butts., PA-C. Go on 06/18/2021.   Specialties: Physician Assistant, Cardiology Why: Appointment time is at 11:15 am Contact information: [address] [place] [address] [phone]                 Signed: John Giovanni, PA-C  06/13/2021, 10:09 AM

## 2021-06-05 NOTE — Discharge Instructions (Signed)

## 2021-06-05 NOTE — Progress Notes (Signed)
[address]       [place] [address]             [phone]      1 Day Post-Op  Procedure(s) (LRB): CORONARY ARTERY BYPASS GRAFTING (CABG), ON PUMP, TIMES FOUR, USING LEFT INTERNAL MAMMARY ARTERY, LEFT RADIAL ARTERY (HARVESTED OPEN), AND RIGHT GREATER SAPHENOUS VEIN (HARVESTED ENDOSCOPICALLY) (N/A) RADIAL ARTERY HARVEST (Left) TRANSESOPHAGEAL ECHOCARDIOGRAM (TEE) (N/A)   Total Length of Stay:  LOS: 1 day    SUBJECTIVE:  Vitals:   06/05/21 1400 06/05/21 1500  BP: 93/80 (!) 91/47  Pulse: 80 79  Resp: 19 (!) 24  Temp:    SpO2: 98% 98%    Intake/Output      09/27 0701 09/28 0700 09/28 0701 09/29 0700   P.O.  480   I.V. (mL/kg) 2862.3 (57.9) 75 (1.5)   Blood 250    IV Piggyback 1090.5 150.1   Total Intake(mL/kg) 4202.8 (85.1) 705.1 (14.3)   Urine (mL/kg/hr) 3130 (2.6) 205 (0.5)   Blood 530    Chest Tube 330 50   Total Output 3990 255   Net +212.8 +450.1            sodium chloride Stopped (06/05/21 1207)   sodium chloride     lactated ringers     lactated ringers Stopped (06/05/21 0912)   promethazine (PHENERGAN) injection (IM or IVPB) Stopped (06/05/21 0413)   sodium chloride      CBC    Component Value Date/Time   WBC 12.6 (H) 06/05/2021 0354   RBC 4.10 06/05/2021 0354   HGB 8.3 (L) 06/05/2021 0354   HGB 13.1 04/15/2021 1027   HCT 28.1 (L) 06/05/2021 0354   HCT 45.4 04/15/2021 1027   PLT 296 06/05/2021 0354   PLT 441 04/15/2021 1027   MCV 68.5 (L) 06/05/2021 0354   MCV 68 (L) 04/15/2021 1027   MCH 20.2 (L) 06/05/2021 0354   MCHC 29.5 (L) 06/05/2021 0354   RDW 22.5 (H) 06/05/2021 0354   RDW 21.6 (H) 04/15/2021 1027   LYMPHSABS 2.0 09/22/2018 1152   MONOABS 0.7 09/22/2018 1152   EOSABS 0.3 09/22/2018 1152   BASOSABS 0.1 09/22/2018 1152   CMP     Component Value Date/Time   NA 134 (L) 06/05/2021 0354   NA 139 04/15/2021 1027   K 4.0 06/05/2021 0354   CL 105 06/05/2021 0354   CO2 23 06/05/2021 0354   GLUCOSE 107  (H) 06/05/2021 0354   BUN 7 (L) 06/05/2021 0354   BUN 14 04/15/2021 1027   CREATININE 0.73 06/05/2021 0354   CREATININE 0.84 07/14/2016 0914   CALCIUM 7.3 (L) 06/05/2021 0354   PROT 7.8 05/31/2021 1200   PROT 7.3 06/28/2020 1150   ALBUMIN 4.1 05/31/2021 1200   ALBUMIN 4.4 06/28/2020 1150   AST 21 05/31/2021 1200   ALT 14 05/31/2021 1200   ALKPHOS 66 05/31/2021 1200   BILITOT 0.8 05/31/2021 1200   BILITOT 0.4 06/28/2020 1150   GFRNONAA >60 06/05/2021 0354   GFRAA 88 06/28/2020 1150   ABG    Component Value Date/Time   PHART 7.352 06/04/2021 1632   PCO2ART 44.0 06/04/2021 1632   PO2ART 199 (H) 06/04/2021 1632   HCO3 24.4 06/04/2021 1632   TCO2 26 06/04/2021 1632   ACIDBASEDEF 1.0 06/04/2021 1632   O2SAT 100.0 06/04/2021 1632   CBG (last 3)  Recent Labs    06/05/21 0353 06/05/21 0832 06/05/21 1156  GLUCAP 102* 152* 78  ASSESSMENT:  Patient had stable day, ambulated around unit. CT output 50 cc today Difficulty coughing up sputum, mucinex started Colchicine restarted per patients request for Mediterranean fever Transfer to 2c or 4E  Ellwood Handler, PA-C 06/05/21

## 2021-06-05 NOTE — Progress Notes (Addendum)
[facility] DAILY ICU PROGRESS NOTE                   [address]            [place] [address]          [phone]   1 Day Post-Op Procedure(s) (LRB): CORONARY ARTERY BYPASS GRAFTING (CABG), ON PUMP, TIMES FOUR, USING LEFT INTERNAL MAMMARY ARTERY, LEFT RADIAL ARTERY (HARVESTED OPEN), AND RIGHT GREATER SAPHENOUS VEIN (HARVESTED ENDOSCOPICALLY) (N/A) RADIAL ARTERY HARVEST (Left) TRANSESOPHAGEAL ECHOCARDIOGRAM (TEE) (N/A)  Total Length of Stay:  LOS: 1 day   Subjective:  No new complaints.  She feels pretty good.  She had some nausea, which resolved with medication, pain is controlled.  She also has some mild numbness at her radial artery site.  Objective: Vital signs in last 24 hours: Temp:  [96.8 F (36 C)-100.9 F (38.3 C)] 99 F (37.2 C) (09/28 0800) Pulse Rate:  [73-101] 95 (09/28 0800) Cardiac Rhythm: Normal sinus rhythm (09/28 0745) Resp:  [12-31] 19 (09/28 0800) BP: (94-121)/(50-77) 94/53 (09/28 0800) SpO2:  [92 %-100 %] 97 % (09/28 0800) Arterial Line BP: (75-159)/(42-111) 114/42 (09/28 0800) FiO2 (%):  [40 %-50 %] 40 % (09/27 1448) Weight:  [49.4 kg] 49.4 kg (09/28 0500)  Filed Weights   06/04/21 0544 06/05/21 0500  Weight: 47.2 kg 49.4 kg    Weight change: 2.226 kg   Hemodynamic parameters for last 24 hours: CVP:  [0 mmHg-14 mmHg] 1 mmHg  Intake/Output from previous day: 09/27 0701 - 09/28 0700 In: 4202.8 [I.V.:2862.3; Blood:250; IV Piggyback:1090.5] Out: 4481 [Urine:3130; Blood:530; Chest Tube:330]  Intake/Output this shift: Total I/O In: 270 [P.O.:240; I.V.:30] Out: 40 [Urine:30; Chest Tube:10]  Current Meds: Scheduled Meds:  acetaminophen  1,000 mg Oral Q6H   Or   acetaminophen (TYLENOL) oral liquid 160 mg/5 mL  1,000 mg Per Tube Q6H   amLODipine  2.5 mg Oral Daily   aspirin EC  325 mg Oral Daily   Or   aspirin  324 mg Per Tube Daily   bisacodyl  10 mg Oral Daily   Or   bisacodyl  10 mg Rectal Daily   chlorhexidine  15 mL Mouth  Rinse BID   Chlorhexidine Gluconate Cloth  6 each Topical Daily   docusate sodium  200 mg Oral Daily   enoxaparin (LOVENOX) injection  40 mg Subcutaneous QHS   insulin aspart  0-24 Units Subcutaneous Q4H   insulin aspart  0-24 Units Subcutaneous Q4H   mouth rinse  15 mL Mouth Rinse q12n4p   metoprolol tartrate  25 mg Oral BID   [START ON 06/06/2021] pantoprazole  40 mg Oral Daily   rosuvastatin  10 mg Oral QODAY   sodium chloride flush  10-40 mL Intracatheter Q12H   sodium chloride flush  3 mL Intravenous Q12H   Continuous Infusions:  sodium chloride 10 mL/hr at 06/05/21 0800   sodium chloride     sodium chloride     albumin human 12.5 g (06/04/21 2238)   And   sodium chloride     lactated ringers     lactated ringers     lactated ringers 20 mL/hr at 06/05/21 0800   levofloxacin (LEVAQUIN) IV     promethazine (PHENERGAN) injection (IM or IVPB) Stopped (06/05/21 0413)   PRN Meds:.sodium chloride, albumin human **AND** sodium chloride, lactated ringers, metoprolol tartrate, midazolam, morphine injection, ondansetron (ZOFRAN) IV, oxyCODONE, promethazine (PHENERGAN) injection (IM or IVPB), sodium chloride flush, sodium chloride flush,  traMADol  General appearance: alert, cooperative, and no distress Heart: regular rate and rhythm Lungs: clear to auscultation bilaterally Abdomen: soft, non-tender; bowel sounds normal; no masses,  no organomegaly Extremities: edema none Wound: clean and dry  Lab Results: CBC: Recent Labs    06/04/21 1532 06/04/21 1632 06/05/21 0354  WBC 14.4*  --  12.6*  HGB 8.7* 9.5* 8.3*  HCT 29.5* 28.0* 28.1*  PLT 283  --  296   BMET:  Recent Labs    06/04/21 1532 06/04/21 1632 06/05/21 0354  NA 136 139 134*  K 4.2 4.3 4.0  CL 108  --  105  CO2 24  --  23  GLUCOSE 139*  --  107*  BUN 9  --  7*  CREATININE 0.72  --  0.73  CALCIUM 7.7*  --  7.3*    CMET: Lab Results  Component Value Date   WBC 12.6 (H) 06/05/2021   HGB 8.3 (L) 06/05/2021    HCT 28.1 (L) 06/05/2021   PLT 296 06/05/2021   GLUCOSE 107 (H) 06/05/2021   CHOL 132 02/01/2021   TRIG 77.0 02/01/2021   HDL 40.70 02/01/2021   LDLCALC 76 02/01/2021   ALT 14 05/31/2021   AST 21 05/31/2021   NA 134 (L) 06/05/2021   K 4.0 06/05/2021   CL 105 06/05/2021   CREATININE 0.73 06/05/2021   BUN 7 (L) 06/05/2021   CO2 23 06/05/2021   TSH 2.99 05/15/2016   INR 1.5 (H) 06/04/2021   HGBA1C 5.4 05/31/2021      PT/INR:  Recent Labs    06/04/21 1532  LABPROT 18.4*  INR 1.5*   Radiology: [facility] Chest Port 1 View  Result Date: 06/05/2021 CLINICAL DATA:  Status post median sternotomy EXAM: PORTABLE CHEST 1 VIEW COMPARISON:  Chest x-ray dated June 04, 2021 FINDINGS: Interval extubation and removal of enteric tube. Unchanged right IJ line and mediastinal drains. Cardiac and mediastinal contours are unchanged post median sternotomy. Unchanged pneumomediastinum. Mild bilateral heterogeneous opacities, likely combination of atelectasis and pulmonary edema. No large pleural effusion or pneumothorax. IMPRESSION: Interval extubation and removal of enteric tube. No new parenchymal opacity. Electronically Signed   By: Yetta Glassman M.D.   On: 06/05/2021 08:41   DG Chest Port 1 View  Result Date: 06/04/2021 CLINICAL DATA:  Status post CABG x4 EXAM: PORTABLE CHEST 1 VIEW COMPARISON:  05/31/2021 chest radiograph. FINDINGS: Right internal jugular central venous catheter terminates in middle third of the SVC. CABG clips overlie the left mediastinum. Endotracheal tube tip is 2.6 cm above the carina. Enteric tube enters stomach with the tip not seen on this image. Two mediastinal drains in place. Intact sternotomy wires. Stable cardiomediastinal silhouette with normal heart size. No pneumothorax. No pleural effusion. No pulmonary edema. Hazy left lower lobe opacity, favor atelectasis. IMPRESSION: 1. Well-positioned support structures. No pneumothorax. 2. Hazy left lower lobe opacity, favor  atelectasis. Electronically Signed   By: Ilona Sorrel M.D.   On: 06/04/2021 13:33     Assessment/Plan: S/P Procedure(s) (LRB): CORONARY ARTERY BYPASS GRAFTING (CABG), ON PUMP, TIMES FOUR, USING LEFT INTERNAL MAMMARY ARTERY, LEFT RADIAL ARTERY (HARVESTED OPEN), AND RIGHT GREATER SAPHENOUS VEIN (HARVESTED ENDOSCOPICALLY) (N/A) RADIAL ARTERY HARVEST (Left) TRANSESOPHAGEAL ECHOCARDIOGRAM (TEE) (N/A)  CV- NSR, BP okay- she is not on any drips, start low dose BB today Pulm- no acute issues, CT output 330 since surgery, will leave in place today, CXR w/o effusion, mild atelectasis, continue IS Renal- creatinine and lytes are WNL, no edema on  exam, hold off on diuretics for now Expected post operative blood loss anemia, mild hgb at 8.3 monitor Post operative paraesthesia at radial artery site, should improve with time CBGs controlled, patient not a diabetic will start SSIP today Dispo- patient stable, looks great, start low dose BB today, leave chest tubes in place for now, no indication for diuretics at this time.[name] if continues to progress today, may be able to transfer to 4E later today  Ellwood Handler, PA-C 06/05/2021 8:44 AM  Agree with above.  Doing well Ambulation today. Will start colchicine for Mediterranean fever. Will remove all lines. Possible floor today.  Leonid Manus Bary Leriche

## 2021-06-06 ENCOUNTER — Inpatient Hospital Stay (HOSPITAL_COMMUNITY): Payer: Medicare Other

## 2021-06-06 ENCOUNTER — Other Ambulatory Visit (HOSPITAL_COMMUNITY): Payer: Self-pay

## 2021-06-06 LAB — CBC
HCT: 27.7 % — ABNORMAL LOW (ref 36.0–46.0)
Hemoglobin: 8.2 g/dL — ABNORMAL LOW (ref 12.0–15.0)
MCH: 20.3 pg — ABNORMAL LOW (ref 26.0–34.0)
MCHC: 29.6 g/dL — ABNORMAL LOW (ref 30.0–36.0)
MCV: 68.7 fL — ABNORMAL LOW (ref 80.0–100.0)
Platelets: 284 10*3/uL (ref 150–400)
RBC: 4.03 MIL/uL (ref 3.87–5.11)
RDW: 22.4 % — ABNORMAL HIGH (ref 11.5–15.5)
WBC: 14.7 10*3/uL — ABNORMAL HIGH (ref 4.0–10.5)
nRBC: 0 % (ref 0.0–0.2)

## 2021-06-06 LAB — BASIC METABOLIC PANEL
Anion gap: 3 — ABNORMAL LOW (ref 5–15)
BUN: 8 mg/dL (ref 8–23)
CO2: 24 mmol/L (ref 22–32)
Calcium: 7.5 mg/dL — ABNORMAL LOW (ref 8.9–10.3)
Chloride: 107 mmol/L (ref 98–111)
Creatinine, Ser: 0.83 mg/dL (ref 0.44–1.00)
GFR, Estimated: >60 mL/min (ref >60)
Glucose, Bld: 92 mg/dL (ref 70–99)
Potassium: 4.5 mmol/L (ref 3.5–5.1)
Sodium: 134 mmol/L — ABNORMAL LOW (ref 135–145)

## 2021-06-06 LAB — GLUCOSE, CAPILLARY
Glucose-Capillary: 89 mg/dL (ref 70–99)
Glucose-Capillary: 96 mg/dL (ref 70–99)
Glucose-Capillary: 109 mg/dL — ABNORMAL HIGH (ref 70–99)
Glucose-Capillary: 104 mg/dL — ABNORMAL HIGH (ref 70–99)
Glucose-Capillary: 120 mg/dL — ABNORMAL HIGH (ref 70–99)

## 2021-06-06 MED ORDER — SODIUM CHLORIDE 0.9% FLUSH: 3.0000 mL | INTRAVENOUS | Status: DC | PRN

## 2021-06-06 MED ORDER — SODIUM CHLORIDE 0.9% FLUSH
3.0000 mL | Freq: Two times a day (BID) | INTRAVENOUS | Status: DC
  Administered 2021-06-06 – 2021-06-09 (×7): 3 mL via INTRAVENOUS

## 2021-06-06 MED ORDER — ~~LOC~~ CARDIAC SURGERY, PATIENT & FAMILY EDUCATION: Freq: Once | Status: AC

## 2021-06-06 MED ORDER — SODIUM CHLORIDE 0.9 % IV SOLN: 250.0000 mL | INTRAVENOUS | Status: DC | PRN

## 2021-06-06 MED ORDER — COLCHICINE 0.3 MG HALF TABLET
0.3000 mg | ORAL_TABLET | Freq: Two times a day (BID) | ORAL | Status: DC
  Administered 2021-06-06 – 2021-06-09 (×7): 0.3 mg via ORAL
  Filled 2021-06-06 (×8): qty 1

## 2021-06-06 MED FILL — Electrolyte-R (PH 7.4) Solution: INTRAVENOUS | Qty: 3000 | Status: AC

## 2021-06-06 MED FILL — Calcium Chloride Inj 10%: INTRAVENOUS | Qty: 10 | Status: AC

## 2021-06-06 MED FILL — Sodium Bicarbonate IV Soln 8.4%: INTRAVENOUS | Qty: 50 | Status: AC

## 2021-06-06 MED FILL — Sodium Chloride IV Soln 0.9%: INTRAVENOUS | Qty: 2000 | Status: AC

## 2021-06-06 MED FILL — Mannitol IV Soln 20%: INTRAVENOUS | Qty: 500 | Status: AC

## 2021-06-06 NOTE — Progress Notes (Signed)
CARDIAC REHAB PHASE I   Offered to walk with pt. Pt requests to ambulate later, currently having some nausea. Pt give ginger ale and saltines. RN made aware. Will f/u to ambulate as able.  Rufina Falco, RN BSN 06/06/2021 2:05 PM

## 2021-06-06 NOTE — Telephone Encounter (Signed)
Still admitted

## 2021-06-06 NOTE — Progress Notes (Addendum)
TCTS DAILY ICU PROGRESS NOTE                   [address]            [place] [address]          [phone]   2 Days Post-Op Procedure(s) (LRB): CORONARY ARTERY BYPASS GRAFTING (CABG), ON PUMP, TIMES FOUR, USING LEFT INTERNAL MAMMARY ARTERY, LEFT RADIAL ARTERY (HARVESTED OPEN), AND RIGHT GREATER SAPHENOUS VEIN (HARVESTED ENDOSCOPICALLY) (N/A) RADIAL ARTERY HARVEST (Left) TRANSESOPHAGEAL ECHOCARDIOGRAM (TEE) (N/A)  Total Length of Stay:  LOS: 2 days   Subjective: Patient with some incisional pain.  Objective: Vital signs in last 24 hours: Temp:  [98.3 F (36.8 C)-99.5 F (37.5 C)] 98.3 F (36.8 C) (09/29 0712) Pulse Rate:  [73-104] 97 (09/29 0800) Cardiac Rhythm: Normal sinus rhythm (09/29 0800) Resp:  [13-24] 18 (09/29 0800) BP: (90-110)/(47-88) 100/52 (09/29 0800) SpO2:  [93 %-100 %] 96 % (09/29 0800) FiO2 (%):  [21 %] 21 % (09/29 0712) Weight:  [49.7 kg] 49.7 kg (09/29 0500)  Filed Weights   06/04/21 0544 06/05/21 0500 06/06/21 0500  Weight: 47.2 kg 49.4 kg 49.7 kg    Weight change: 0.3 kg      Intake/Output from previous day: 09/28 0701 - 09/29 0700 In: 945.1 [P.O.:720; I.V.:75; IV Piggyback:150.1] Out: 345 [Urine:205; Chest Tube:140]  Intake/Output this shift: No intake/output data recorded.  Current Meds: Scheduled Meds:  acetaminophen  1,000 mg Oral Q6H   Or   acetaminophen (TYLENOL) oral liquid 160 mg/5 mL  1,000 mg Per Tube Q6H   amLODipine  2.5 mg Oral Daily   aspirin EC  325 mg Oral Daily   Or   aspirin  324 mg Per Tube Daily   bisacodyl  10 mg Oral Daily   Or   bisacodyl  10 mg Rectal Daily   chlorhexidine  15 mL Mouth Rinse BID   Chlorhexidine Gluconate Cloth  6 each Topical Daily   colchicine  0.3 mg Oral BID   [facility], Patient & Family Education   Does not apply Once   docusate sodium  200 mg Oral Daily   guaiFENesin  600 mg Oral BID   insulin aspart  0-24 Units Subcutaneous Q4H   insulin aspart   0-24 Units Subcutaneous Q4H   mouth rinse  15 mL Mouth Rinse q12n4p   metoprolol tartrate  25 mg Oral BID   pantoprazole  40 mg Oral Daily   rosuvastatin  10 mg Oral QODAY   sodium chloride flush  10-40 mL Intracatheter Q12H   sodium chloride flush  3 mL Intravenous Q12H   sodium chloride flush  3 mL Intravenous Q12H   Continuous Infusions:  sodium chloride Stopped (06/05/21 1207)   sodium chloride     sodium chloride     lactated ringers     lactated ringers Stopped (06/05/21 0912)   promethazine (PHENERGAN) injection (IM or IVPB) Stopped (06/05/21 0413)   sodium chloride     PRN Meds:.sodium chloride, sodium chloride, influenza vac split quadrivalent PF, metoprolol tartrate, midazolam, morphine injection, ondansetron (ZOFRAN) IV, oxyCODONE, promethazine (PHENERGAN) injection (IM or IVPB), sodium chloride flush, sodium chloride flush, sodium chloride flush, traMADol  General appearance: alert, cooperative, and no distress Neurologic: intact Heart: RRR Lungs: clear to auscultation bilaterally Abdomen: Soft, non tender, bowel sounds present Extremities: Mild LE edema. Motor/sensory intact LUE  Wounds: Dressing intact sternal wound. RLE and LUE wounds are clean and dry  Lab Results: CBC: Recent Labs    06/05/21 1616 06/06/21 0311  WBC 16.0* 14.7*  HGB 8.5* 8.2*  HCT 29.0* 27.7*  PLT 344 284   BMET:  Recent Labs    06/05/21 1616 06/06/21 0311  NA 130* 134*  K 4.3 4.5  CL 102 107  CO2 24 24  GLUCOSE 110* 92  BUN 8 8  CREATININE 0.77 0.83  CALCIUM 7.3* 7.5*    CMET: Lab Results  Component Value Date   WBC 14.7 (H) 06/06/2021   HGB 8.2 (L) 06/06/2021   HCT 27.7 (L) 06/06/2021   PLT 284 06/06/2021   GLUCOSE 92 06/06/2021   CHOL 132 02/01/2021   TRIG 77.0 02/01/2021   HDL 40.70 02/01/2021   LDLCALC 76 02/01/2021   ALT 14 05/31/2021   AST 21 05/31/2021   NA 134 (L) 06/06/2021   K 4.5 06/06/2021   CL 107 06/06/2021   CREATININE 0.83 06/06/2021   BUN 8  06/06/2021   CO2 24 06/06/2021   TSH 2.99 05/15/2016   INR 1.5 (H) 06/04/2021   HGBA1C 5.4 05/31/2021    PT/INR:  Recent Labs    06/04/21 1532  LABPROT 18.4*  INR 1.5*   Radiology: No results found.   Assessment/Plan: S/P Procedure(s) (LRB): CORONARY ARTERY BYPASS GRAFTING (CABG), ON PUMP, TIMES FOUR, USING LEFT INTERNAL MAMMARY ARTERY, LEFT RADIAL ARTERY (HARVESTED OPEN), AND RIGHT GREATER SAPHENOUS VEIN (HARVESTED ENDOSCOPICALLY) (N/A) RADIAL ARTERY HARVEST (Left) TRANSESOPHAGEAL ECHOCARDIOGRAM (TEE) (N/A) CV-SR with HR in the 90's. She has had intermittent tachycardia. On Amlodipine 2.5 mg daily for radial artery harvest, Lopressor 25 mg bid. Will not titrate BB secondary to BP. Pulmonary-On room air. CXR this am appears to show atelectasis, gas in colon, and no pneumothorax. Encourage incentive spirometer. Volume Overload-will give Lasix in am if BP improved Expected post op blood loss anemia-H and H slightly decreased to 8.2 and 27.7 5. CBGs 135/85/89. Pre op HGA1C 5.4. Will stop accu checks and SS PRN on transfer 6. 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 Transfer if bed available  Sharalyn Ink [facility] 06/06/2021 8:28 AM   Agree with above Doing well Awaiting floor bed  Hina Gupta O Burgundy Matuszak

## 2021-06-06 NOTE — Progress Notes (Signed)
Patient brought to 4E from [place]. Husband present. Telemetry wall monitor applied, CCMD notified. VSS. Patient states pain 0/10 on pain scale. Patient oriented to room and staff. Call bell in reach.  Daymon Larsen, RN

## 2021-06-07 ENCOUNTER — Inpatient Hospital Stay (HOSPITAL_COMMUNITY): Payer: Medicare Other

## 2021-06-07 LAB — BASIC METABOLIC PANEL
Anion gap: 5 (ref 5–15)
BUN: 10 mg/dL (ref 8–23)
CO2: 23 mmol/L (ref 22–32)
Calcium: 7.9 mg/dL — ABNORMAL LOW (ref 8.9–10.3)
Chloride: 109 mmol/L (ref 98–111)
Creatinine, Ser: 0.69 mg/dL (ref 0.44–1.00)
GFR, Estimated: >60 mL/min (ref >60)
Glucose, Bld: 95 mg/dL (ref 70–99)
Potassium: 4.6 mmol/L (ref 3.5–5.1)
Sodium: 137 mmol/L (ref 135–145)

## 2021-06-07 LAB — GLUCOSE, CAPILLARY
Glucose-Capillary: 94 mg/dL (ref 70–99)
Glucose-Capillary: 99 mg/dL (ref 70–99)
Glucose-Capillary: 97 mg/dL (ref 70–99)
Glucose-Capillary: 94 mg/dL (ref 70–99)

## 2021-06-07 LAB — CBC
HCT: 29.9 % — ABNORMAL LOW (ref 36.0–46.0)
Hemoglobin: 8.5 g/dL — ABNORMAL LOW (ref 12.0–15.0)
MCH: 19.9 pg — ABNORMAL LOW (ref 26.0–34.0)
MCHC: 28.4 g/dL — ABNORMAL LOW (ref 30.0–36.0)
MCV: 69.9 fL — ABNORMAL LOW (ref 80.0–100.0)
Platelets: 307 10*3/uL (ref 150–400)
RBC: 4.28 MIL/uL (ref 3.87–5.11)
RDW: 23.3 % — ABNORMAL HIGH (ref 11.5–15.5)
WBC: 13 10*3/uL — ABNORMAL HIGH (ref 4.0–10.5)
nRBC: 0 % (ref 0.0–0.2)

## 2021-06-07 MED ORDER — FUROSEMIDE 40 MG PO TABS
40.0000 mg | ORAL_TABLET | Freq: Every day | ORAL | Status: DC
  Administered 2021-06-07 – 2021-06-08 (×2): 40 mg via ORAL
  Filled 2021-06-07 (×2): qty 1

## 2021-06-07 MED ORDER — SORBITOL 70 % SOLN
30.0000 mL | Freq: Once | Status: AC
  Administered 2021-06-07: 30 mL via ORAL
  Filled 2021-06-07: qty 30

## 2021-06-07 MED ORDER — ASPIRIN 325 MG PO TBEC: 325.0000 mg | DELAYED_RELEASE_TABLET | Freq: Every day | ORAL | 0 refills | Status: DC

## 2021-06-07 MED ORDER — DIPHENHYDRAMINE HCL 25 MG PO CAPS
25.0000 mg | ORAL_CAPSULE | Freq: Four times a day (QID) | ORAL | Status: DC | PRN
Start: 2021-06-07 — End: 2021-06-09

## 2021-06-07 MED ORDER — METOCLOPRAMIDE HCL 5 MG/ML IJ SOLN
10.0000 mg | Freq: Four times a day (QID) | INTRAMUSCULAR | Status: AC
  Administered 2021-06-07 (×3): 10 mg via INTRAVENOUS
  Filled 2021-06-07 (×3): qty 2

## 2021-06-07 MED FILL — Lidocaine HCl Local Preservative Free (PF) Inj 2%: INTRAMUSCULAR | Qty: 15 | Status: AC

## 2021-06-07 MED FILL — Potassium Chloride Inj 2 mEq/ML: INTRAVENOUS | Qty: 40 | Status: AC

## 2021-06-07 MED FILL — Heparin Sodium (Porcine) Inj 1000 Unit/ML: Qty: 1000 | Status: AC

## 2021-06-07 NOTE — Telephone Encounter (Signed)
Currently admitted.

## 2021-06-07 NOTE — Progress Notes (Signed)
Mobility Specialist Progress Note    06/07/21 1055  Mobility  Activity Ambulated in hall  Level of Assistance Standby assist, set-up cues, supervision of patient - no hands on  Assistive Device None  Distance Ambulated (ft) 406 ft  Mobility Ambulated independently in hallway  Mobility Response Tolerated well  Mobility performed by Mobility specialist  $Mobility charge 1 Mobility   Pre-Mobility: 93 HR,100% SpO2 During Mobility: 119 HR Post-Mobility: 100 HR  Pt received in bed and agreeable with RN present. Provided handout on sternal precautions and reviewed sitting and standing. Family member present in room.  Hildred Alamin Mobility Specialist  Mobility Specialist Phone: [phone]

## 2021-06-07 NOTE — Progress Notes (Signed)
CARDIAC REHAB PHASE I   Offered to walk with pt. Pt states fatigue and nausea this afternoon. D/c education completed with pt and family. Pt educated on importance of site care and monitoring incisions daily. Encouraged continued IS use walks, and sternal precautions. Pt given in-the-tube sheet along with heart healthy diet. Reviewed restrictions and exercise guidelines. Will refer to CRP II [place] Rufina Falco, RN BSN 06/07/2021 2:29 PM

## 2021-06-07 NOTE — Care Management Important Message (Signed)
Important Message  Patient Details  Name: Katrina Walters MRN: [id number] Date of Birth: 05-Feb-1956   Medicare Important Message Given:  Yes     Shelda Altes 06/07/2021, 9:45 AM

## 2021-06-07 NOTE — Progress Notes (Addendum)
      [address]       [place] [address]             [phone]        3 Days Post-Op Procedure(s) (LRB): CORONARY ARTERY BYPASS GRAFTING (CABG), ON PUMP, TIMES FOUR, USING LEFT INTERNAL MAMMARY ARTERY, LEFT RADIAL ARTERY (HARVESTED OPEN), AND RIGHT GREATER SAPHENOUS VEIN (HARVESTED ENDOSCOPICALLY) (N/A) RADIAL ARTERY HARVEST (Left) TRANSESOPHAGEAL ECHOCARDIOGRAM (TEE) (N/A)  Subjective: Patient states her stomach is upset. She denies abdominal pain or vomiting. She has not had a bowel movement yet.  Objective: Vital signs in last 24 hours: Temp:  [98.3 F (36.8 C)-99 F (37.2 C)] 98.9 F (37.2 C) (09/30 0409) Pulse Rate:  [81-97] 84 (09/30 0409) Cardiac Rhythm: Normal sinus rhythm (09/29 1931) Resp:  [16-21] 20 (09/30 0409) BP: (94-110)/(52-65) 110/65 (09/30 0409) SpO2:  [94 %-100 %] 94 % (09/30 0409) FiO2 (%):  [21 %] 21 % (09/29 0712)  Pre op weight 47.2 kg Current Weight  06/06/21 49.7 kg      Intake/Output from previous day: 09/29 0701 - 09/30 0700 In: 243 [P.O.:240; I.V.:3] Out: -    Physical Exam:  Cardiovascular: RRR Pulmonary: Clear to auscultation bilaterally Abdomen: Soft, non tender, bowel sounds present. Extremities: Trace bilateral lower extremity edema. LUE Motor/sensory intact Wounds: All dressings removed and wounds are clean and dry.  No erythema or signs of infection.  Lab Results: CBC: Recent Labs    06/06/21 0311 06/07/21 0124  WBC 14.7* 13.0*  HGB 8.2* 8.5*  HCT 27.7* 29.9*  PLT 284 307   BMET:  Recent Labs    06/06/21 0311 06/07/21 0124  NA 134* 137  K 4.5 4.6  CL 107 109  CO2 24 23  GLUCOSE 92 95  BUN 8 10  CREATININE 0.83 0.69  CALCIUM 7.5* 7.9*    PT/INR:  Lab Results  Component Value Date   INR 1.5 (H) 06/04/2021   INR 3.9 (H) 06/04/2021   INR 1.1 05/31/2021   ABG:  INR: Will add last result for INR, ABG once components are confirmed Will add last 4 CBG results once components are  confirmed  Assessment/Plan:  1. CV - SR. On Lopressor 25 mg bid and Amlodipine 2.5 mg daily. 2.  Pulmonary - On room air. CXR appears stable.Encourage incentive spirometer. 3. Volume Overload - Give Lasix 40 mg daily 4.  Expected post op acute blood loss anemia - H and H this am stable at 8.5 and 29.9 5. CBGs 104/120/94. Pre op HGA1C 5.4. Stop accu checks and SS PRN 6. 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 GI-will give a few doses of Reglan and laxative 8. Hope to discharge in 1-2 days  [name] [facility] 06/07/2021,7:08 AM  Agree with above.  Overall doing well For discharge in the next day or so.  Katrina Walters

## 2021-06-08 MED ORDER — METOPROLOL TARTRATE 25 MG PO TABS
37.5000 mg | ORAL_TABLET | Freq: Two times a day (BID) | ORAL | Status: DC
  Administered 2021-06-08: 37.5 mg via ORAL
  Filled 2021-06-08: qty 1

## 2021-06-08 MED ORDER — METOPROLOL TARTRATE 25 MG PO TABS
25.0000 mg | ORAL_TABLET | Freq: Two times a day (BID) | ORAL | Status: DC
  Administered 2021-06-08 – 2021-06-09 (×2): 25 mg via ORAL
  Filled 2021-06-08 (×2): qty 1

## 2021-06-08 NOTE — Progress Notes (Addendum)
[address]       [place] [address]             [phone]      4 Days Post-Op Procedure(s) (LRB): CORONARY ARTERY BYPASS GRAFTING (CABG), ON PUMP, TIMES FOUR, USING LEFT INTERNAL MAMMARY ARTERY, LEFT RADIAL ARTERY (HARVESTED OPEN), AND RIGHT GREATER SAPHENOUS VEIN (HARVESTED ENDOSCOPICALLY) (N/A) RADIAL ARTERY HARVEST (Left) TRANSESOPHAGEAL ECHOCARDIOGRAM (TEE) (N/A) Subjective: Nausea is much improved, overall feels well  Objective: Vital signs in last 24 hours: Temp:  [98.2 F (36.8 C)-99.5 F (37.5 C)] 99.5 F (37.5 C) (10/01 0518) Pulse Rate:  [82-100] 100 (10/01 0518) Cardiac Rhythm: Normal sinus rhythm (09/30 1900) Resp:  [16-20] 18 (10/01 0518) BP: (98-125)/(61-77) 125/77 (10/01 0518) SpO2:  [95 %-100 %] 100 % (10/01 0518) Weight:  [45.2 kg] 45.2 kg (10/01 0634)  Hemodynamic parameters for last 24 hours:    Intake/Output from previous day: 09/30 0701 - 10/01 0700 In: 290 [P.O.:240; IV Piggyback:50] Out: -  Intake/Output this shift: No intake/output data recorded.  General appearance: alert, cooperative, and no distress Heart: regular rate and rhythm and tachy Lungs: clear Abdomen: benign Extremities: no edema Wound: incis healing well  Lab Results: Recent Labs    06/06/21 0311 06/07/21 0124  WBC 14.7* 13.0*  HGB 8.2* 8.5*  HCT 27.7* 29.9*  PLT 284 307   BMET:  Recent Labs    06/06/21 0311 06/07/21 0124  NA 134* 137  K 4.5 4.6  CL 107 109  CO2 24 23  GLUCOSE 92 95  BUN 8 10  CREATININE 0.83 0.69  CALCIUM 7.5* 7.9*    PT/INR: No results for input(s): LABPROT, INR in the last 72 hours. ABG    Component Value Date/Time   PHART 7.352 06/04/2021 1632   HCO3 24.4 06/04/2021 1632   TCO2 26 06/04/2021 1632   ACIDBASEDEF 1.0 06/04/2021 1632   O2SAT 100.0 06/04/2021 1632   CBG (last 3)  Recent Labs    06/07/21 0900 06/07/21 1216 06/07/21 1616  GLUCAP 99 97 94    Meds Scheduled Meds:  acetaminophen  1,000  mg Oral Q6H   amLODipine  2.5 mg Oral Daily   aspirin EC  325 mg Oral Daily   bisacodyl  10 mg Oral Daily   Or   bisacodyl  10 mg Rectal Daily   chlorhexidine  15 mL Mouth Rinse BID   colchicine  0.3 mg Oral BID   docusate sodium  200 mg Oral Daily   furosemide  40 mg Oral Daily   guaiFENesin  600 mg Oral BID   mouth rinse  15 mL Mouth Rinse q12n4p   metoprolol tartrate  25 mg Oral BID   pantoprazole  40 mg Oral Daily   rosuvastatin  10 mg Oral QODAY   sodium chloride flush  3 mL Intravenous Q12H   sodium chloride flush  3 mL Intravenous Q12H   Continuous Infusions:  sodium chloride     promethazine (PHENERGAN) injection (IM or IVPB) Stopped (06/07/21 2132)   PRN Meds:.sodium chloride, diphenhydrAMINE, influenza vac split quadrivalent PF, metoprolol tartrate, midazolam, morphine injection, ondansetron (ZOFRAN) IV, oxyCODONE, promethazine (PHENERGAN) injection (IM or IVPB), sodium chloride flush, sodium chloride flush, traMADol  Xrays DG Chest 2 View  Result Date: 06/07/2021 CLINICAL DATA:  Evaluate for pleural effusion EXAM: CHEST - 2 VIEW COMPARISON:  06/06/2021 FINDINGS: Improving aeration. Minimal residual left base atelectasis. No visible effusions. Heart is normal size. Prior CABG. No pneumothorax. No  acute bony abnormality. IMPRESSION: Improving aeration with decreasing left base atelectasis. No visible pneumothorax or effusion. Electronically Signed   By: Rolm Baptise M.D.   On: 06/07/2021 08:44    Assessment/Plan: S/P Procedure(s) (LRB): CORONARY ARTERY BYPASS GRAFTING (CABG), ON PUMP, TIMES FOUR, USING LEFT INTERNAL MAMMARY ARTERY, LEFT RADIAL ARTERY (HARVESTED OPEN), AND RIGHT GREATER SAPHENOUS VEIN (HARVESTED ENDOSCOPICALLY) (N/A) RADIAL ARTERY HARVEST (Left) TRANSESOPHAGEAL ECHOCARDIOGRAM (TEE) (N/A)  1 Tmax 99.5, VSS s BP 98-120's, sinus tachy in 120's- will increase metoprolol dose 2 sats good on RA 3 weight below preop, good UOP by multiple voids 4 no new labs 5  BS well controlled- not a diabetic 6 doing very well, cont routine rehab, hopefully home in am  LOS: 4 days    John Giovanni PA-C Pager [phone] 06/08/2021    Chart reviewed, patient examined, agree with above. She has had some mild sinus tach to 120's and Lopressor increased this am to 37.5 bid. She was walking down hall and became dizzy, sat in chair and then syncopal. Lied on floor and recovered. She says she usually runs a low BP. She did not have any arrhythmias during this event. Will decrease Lopressor back to 25 bid. Lasix stopped. She is fine now in bed.

## 2021-06-08 NOTE — Progress Notes (Signed)
Pt ambulating in hallway with cardiac rehab, got SOB, sat in chair and became limp/unresponsive, was lowered to the floor and consciousness was regained, Vitals were taken and were stable, MD Bartle was notified, back to room with assist, VSS, Call light within reach, will continue to monitor.   Katrina Carota, RN 06/08/2021 11:04 AM

## 2021-06-08 NOTE — Progress Notes (Signed)
CARDIAC REHAB PHASE I   PRE:  Rate/Rhythm: 82 SR  BP:  Supine: 108/65  Sitting:   Standing:    SaO2: 100 RA  MODE:  Ambulation: 225 ft   POST:  Rate/Rhythm:   BP:  Supine:   Sitting:   Standing:    SaO2:   Pt AMB 225 ft with standby assist. Once at the end of the hallway pt stated she was SOB, sat pt down in chair, pt became unresponsive while sitting in chair. Pt then became rigid and called for assistance, multiple staff assisted pt from chair to supine position on the floor. Pt regained consciousness, was diaphoretic, but was able to respond to questions appropriately. Dr Cyndia Bent at pt side. Vital signs stable. Per charge nurse no arrhythmias noted. Pt assist to stand with no return of symptoms. Returned to room via wheel chair. Primary RN at bedside.   [id number] Kirby Funk ACSM-EP 06/08/2021 11:02 AM

## 2021-06-09 MED ORDER — METOPROLOL TARTRATE 25 MG PO TABS
25.0000 mg | ORAL_TABLET | Freq: Two times a day (BID) | ORAL | 1 refills | Status: DC
Start: 2021-06-09 — End: 2021-06-18

## 2021-06-09 MED ORDER — AMLODIPINE BESYLATE 2.5 MG PO TABS: 2.5000 mg | ORAL_TABLET | Freq: Every day | ORAL | 1 refills | Status: DC

## 2021-06-09 MED ORDER — TRAMADOL HCL 50 MG PO TABS: 50.0000 mg | ORAL_TABLET | Freq: Four times a day (QID) | ORAL | 0 refills | Status: AC | PRN

## 2021-06-09 NOTE — Progress Notes (Addendum)
5 Days Post-Op Procedure(s) (LRB): CORONARY ARTERY BYPASS GRAFTING (CABG), ON PUMP, TIMES FOUR, USING LEFT INTERNAL MAMMARY ARTERY, LEFT RADIAL ARTERY (HARVESTED OPEN), AND RIGHT GREATER SAPHENOUS VEIN (HARVESTED ENDOSCOPICALLY) (N/A) RADIAL ARTERY HARVEST (Left) TRANSESOPHAGEAL ECHOCARDIOGRAM (TEE) (N/A) Subjective: Feels well, tachycardia is improved, no nausea, minor sputum production  Objective: Vital signs in last 24 hours: Temp:  [97.9 F (36.6 C)-99 F (37.2 C)] 98.4 F (36.9 C) (10/02 0426) Pulse Rate:  [83-107] 83 (10/02 0426) Cardiac Rhythm: Normal sinus rhythm (10/01 2110) Resp:  [16-20] 17 (10/02 0426) BP: (101-125)/(63-86) 112/65 (10/02 0426) SpO2:  [95 %-100 %] 100 % (10/02 0426) Weight:  [45.6 kg] 45.6 kg (10/02 0500)  Hemodynamic parameters for last 24 hours:    Intake/Output from previous day: 10/01 0701 - 10/02 0700 In: 416 [P.O.:416] Out: -  Intake/Output this shift: No intake/output data recorded.  General appearance: alert, cooperative, and no distress Heart: regular rate and rhythm Lungs: coarse, improves with cough Abdomen: benign Extremities: no edema Wound: incis healing well  Lab Results: Recent Labs    06/07/21 0124  WBC 13.0*  HGB 8.5*  HCT 29.9*  PLT 307   BMET:  Recent Labs    06/07/21 0124  NA 137  K 4.6  CL 109  CO2 23  GLUCOSE 95  BUN 10  CREATININE 0.69  CALCIUM 7.9*    PT/INR: No results for input(s): LABPROT, INR in the last 72 hours. ABG    Component Value Date/Time   PHART 7.352 06/04/2021 1632   HCO3 24.4 06/04/2021 1632   TCO2 26 06/04/2021 1632   ACIDBASEDEF 1.0 06/04/2021 1632   O2SAT 100.0 06/04/2021 1632   CBG (last 3)  Recent Labs    06/07/21 0900 06/07/21 1216 06/07/21 1616  GLUCAP 99 97 94    Meds Scheduled Meds:  acetaminophen  1,000 mg Oral Q6H   amLODipine  2.5 mg Oral Daily   aspirin EC  325 mg Oral Daily   bisacodyl  10 mg Oral Daily   Or   bisacodyl  10 mg Rectal Daily    chlorhexidine  15 mL Mouth Rinse BID   colchicine  0.3 mg Oral BID   docusate sodium  200 mg Oral Daily   guaiFENesin  600 mg Oral BID   mouth rinse  15 mL Mouth Rinse q12n4p   metoprolol tartrate  25 mg Oral BID   pantoprazole  40 mg Oral Daily   rosuvastatin  10 mg Oral QODAY   sodium chloride flush  3 mL Intravenous Q12H   sodium chloride flush  3 mL Intravenous Q12H   Continuous Infusions:  sodium chloride     PRN Meds:.sodium chloride, diphenhydrAMINE, influenza vac split quadrivalent PF, ondansetron (ZOFRAN) IV, oxyCODONE, sodium chloride flush, sodium chloride flush, traMADol  Xrays No results found.  Assessment/Plan: S/P Procedure(s) (LRB): CORONARY ARTERY BYPASS GRAFTING (CABG), ON PUMP, TIMES FOUR, USING LEFT INTERNAL MAMMARY ARTERY, LEFT RADIAL ARTERY (HARVESTED OPEN), AND RIGHT GREATER SAPHENOUS VEIN (HARVESTED ENDOSCOPICALLY) (N/A) RADIAL ARTERY HARVEST (Left) TRANSESOPHAGEAL ECHOCARDIOGRAM (TEE) (N/A)  1 afeb, VSS, HR controll better, some low level tachy, no recurrence of syncope- overall feels well 2 sats good on RA 3 no new labs 4 I/O - amts not recorded, she is voiding 5 conts routine rehab/pulm toilet 6 stable for d/c     LOS: 5 days    John Giovanni PA-C Pager [phone] 06/09/2021    Chart reviewed, patient examined, agree with above. She feels well this am and  ready to go home. No tachycardia overnight.

## 2021-06-09 NOTE — Progress Notes (Signed)
Pt being d/c, VSS, IV removed, Education complete.   Chrisandra Carota, RN 06/09/2021 11:14 AM

## 2021-06-09 NOTE — Progress Notes (Signed)
Pt walked approx 250 ft, no SOB, remained stable and walked back to room, BP after walk 113/82.   Chrisandra Carota, RN 06/09/2021 11:09 AM

## 2021-06-11 NOTE — Telephone Encounter (Signed)
Patient contacted regarding discharge from [facility] on 06/07/21.  Patient understands to follow up with provider kroeger on 06/18/21 at 11:15 am at [facility]. Patient understands discharge instructions?yes Patient understands medications and regiment?yes Patient understands to bring all medications to this visit? yes  Ask patient:  Are you enrolled in My Chart yes If no ask patient if they would like to enroll.    Postop Surgical Patients:                What is your wound status? Any signs/ symptoms of infection (Temp, redness/ red streaks, swelling, purulent drainage, foul odor or smell)?no              Please do not place any creams/ lotions/ or antibiotic ointment on any surgical incisions/ wounds without physician approval.yes              Do you have any questions about your medications?  All medications (except pain medications) are to be filled by your Cardiologist AFTER your first post op appointment with them.  Are you taking your pain medication?no              How is your pain controlled? Pain level?no pain              If you require a refill on pain medications, know that the same medication/ amount may not be prescribed or a refill may not be given.  Please contact your pharmacy for refill requests.               Do you have help at home with ADL's?  If you have home health, have you been contacted or seen by the agency?yes              Please refer to your Pre/post surgery booklet, there is a lot of useful information in it that may answer any questions you may have.              Please note that it is ok to remove your surgical dressing, shower (soap/ water), and pat the incision dry.  For surgery related questions staff will route a phone note to CV [facility] pool  [facility] [address] [place], [place] [address]

## 2021-06-11 NOTE — Telephone Encounter (Signed)
Patient returning call.

## 2021-06-11 NOTE — Telephone Encounter (Signed)
Left message for pt to call.

## 2021-06-12 ENCOUNTER — Telehealth (HOSPITAL_COMMUNITY): Payer: Self-pay

## 2021-06-12 NOTE — Telephone Encounter (Signed)
Pt insurance is active and benefits verified through [facility] Medicare Co-pay 0, DED 0/0 met, out of pocket $4,500/$1,178.96 met, co-insurance 0%. no pre-authorization required. Passport, 06/12/2021_0 :44pm, REF# [id number]   Will contact patient to see if she is interested in the Cardiac Rehab Program. If interested, patient will need to complete follow up appt. Once completed, patient will be contacted for scheduling upon review by the RN Navigator.

## 2021-06-12 NOTE — Telephone Encounter (Signed)
Pt called and is interested in the cardiac rehab program, I advised pt that she would have to complete her f/u appt and that we would contact her at a later time to schedule. I also advised pt that we have a 1-3 month backlog. Pt understood.

## 2021-06-13 ENCOUNTER — Telehealth: Payer: Self-pay

## 2021-06-13 NOTE — Telephone Encounter (Signed)
Transition Care Management Follow-up Telephone Call Date of discharge and from where: 06/09/2021 / [facility] How have you been since you were released from the hospital? : doing really good" Any questions or concerns? Yes  Items Reviewed: Did the pt receive and understand the discharge instructions provided? Yes  Medications obtained and verified? Yes  Other? No  Any new allergies since your discharge? No  Dietary orders reviewed? Yes ( Heart Healthy) Do you have support at home? Yes   Home Care and Equipment/Supplies: Were home health services ordered? no If so, what is the name of the agency? N/a  Has the agency set up a time to come to the patient's home? not applicable Were any new equipment or medical supplies ordered?  No What is the name of the medical supply agency? N/A Were you able to get the supplies/equipment? not applicable Do you have any questions related to the use of the equipment or supplies? No  Functional Questionnaire: (I = Independent and D = Dependent) ADLs: I  Bathing/Dressing- I  Meal Prep- I  Eating- I  Maintaining continence- I  Transferring/Ambulation- I  Managing Meds- I  Follow up appointments reviewed:  PCP Hospital f/u appt confirmed? No  Scheduled to see  Specialist Hospital f/u appt confirmed? Yes  Scheduled to see Dr. Curran Lenderman Poke on 06/18/2021 @ 11am. Are transportation arrangements needed? Yes  If their condition worsens, is the pt aware to call PCP or go to the Emergency Dept.? Yes Was the patient provided with contact information for the PCP's office or ED? Yes Was to pt encouraged to call back with questions or concerns? Yes  Quinn Plowman RN,BSN,CCM RN Case Manager [place]  [phone]

## 2021-06-17 ENCOUNTER — Telehealth: Payer: Self-pay | Admitting: Medical

## 2021-06-17 ENCOUNTER — Encounter: Payer: Self-pay | Admitting: Medical

## 2021-06-17 NOTE — Telephone Encounter (Signed)
Opened to review 

## 2021-06-18 ENCOUNTER — Other Ambulatory Visit: Payer: Self-pay

## 2021-06-18 ENCOUNTER — Ambulatory Visit (INDEPENDENT_AMBULATORY_CARE_PROVIDER_SITE_OTHER): Payer: Medicare Other | Admitting: Medical

## 2021-06-18 ENCOUNTER — Encounter: Payer: Self-pay | Admitting: Medical

## 2021-06-18 VITALS — BP 100/60 | HR 71 | Ht 63.0 in | Wt 102.6 lb

## 2021-06-18 DIAGNOSIS — E785 Hyperlipidemia, unspecified: Secondary | ICD-10-CM | POA: Diagnosis not present

## 2021-06-18 DIAGNOSIS — I2510 Atherosclerotic heart disease of native coronary artery without angina pectoris: Secondary | ICD-10-CM

## 2021-06-18 DIAGNOSIS — I10 Essential (primary) hypertension: Secondary | ICD-10-CM

## 2021-06-18 DIAGNOSIS — Z951 Presence of aortocoronary bypass graft: Secondary | ICD-10-CM

## 2021-06-18 DIAGNOSIS — A239 Brucellosis, unspecified: Secondary | ICD-10-CM

## 2021-06-18 MED ORDER — METOPROLOL SUCCINATE ER 25 MG PO TB24: 12.5000 mg | ORAL_TABLET | Freq: Every day | ORAL | 4 refills | Status: DC

## 2021-06-18 NOTE — Progress Notes (Signed)
/    Cardiology Office Note   Date:  06/18/2021   ID:  Katrina Walters, DOB May 04, 1956, MRN [id number]  PCP:  Mackie Pai, PA-C  Cardiologist:  Evalina Field, MD EP: None  Chief Complaint  Patient presents with   Hospitalization Follow-up    S/p CABG       History of Present Illness: Katrina Walters is a 65 y.o. 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  She was recently admitted to the hospital from 06/04/21-06/13/21 for CABG after recent diagnosis of severe 3-vessel CAD with anginal symptoms. She underwent CABG x4 06/04/21 with LIMA-LAD, reverse SVG-PDA, reverse SVG-OM, and revers SVG-D1. Her post-op course was complicated by left arm paraesthesias from radial artery harvest. She was maintained on colchicine, continued at discharge, for her history of Mediterranean fever. She was discharged home 06/13/21 with close outpatient cardiology follow-up arranged. This was precipitated by a [facility] 04/2021 which showed 60% LM stenosis, 50% pLAD stenosis followed by 70% lesion, 70% D1 stenosis, 20% pRCA stenosis, 50% mRCA stenosis, and 100% PDA stenosis with L>R collaterals. Echocardiogram 04/2021 showed EF 55-60%, G1DD, basal inferior hypokinesis, and no significant valvular abnormalities.   She presents today for post-hospital follow-up. She reports feeling some lightheadedness since discharge but no pre-syncope or syncope. She has been working on walking for 5 minute intervals and has not had any chest pain. She has some DOE which she is attributing to deconditioning but no orthopnea, PND, or LE edema. Blood pressures have been on the soft side but she does not describe orthopnea. No complaints of palpitations. Incision sites are healing well. She has a virtual visit with Dr. Kipp Brood 06/21/21.    Past Medical History:  Diagnosis Date   Complication of anesthesia    Coronary artery  disease    Eating disorder    Genital warts    Hyperlipidemia    Mediterranean fever    diagnosed ~ age 16 years in [place], managed on colchicine   PONV (postoperative nausea and vomiting)     Past Surgical History:  Procedure Laterality Date   ABDOMINAL HYSTERECTOMY  08/2019   ANAL RECTAL MANOMETRY N/A 11/12/2020   Procedure: ANO RECTAL MANOMETRY;  Surgeon: Lavena Bullion, DO;  Location: [facility];  Service: Gastroenterology;  Laterality: N/A;   BLADDER SURGERY  08/2019   BREAST REDUCTION SURGERY  2006   CARDIAC CATHETERIZATION N/A 07/17/2016   Procedure: Left Heart Cath and Coronary Angiography;  Surgeon: Burnell Blanks, MD;  Location: [place] [facility];  Service: Cardiovascular;  Laterality: N/A;   CATARACT EXTRACTION W/ INTRAOCULAR LENS IMPLANT Bilateral    COLONOSCOPY  2015   CORONARY ARTERY BYPASS GRAFT N/A 06/04/2021   Procedure: CORONARY ARTERY BYPASS GRAFTING (CABG), ON PUMP, TIMES FOUR, USING LEFT INTERNAL MAMMARY ARTERY, LEFT RADIAL ARTERY (HARVESTED OPEN), AND RIGHT GREATER SAPHENOUS VEIN (HARVESTED ENDOSCOPICALLY);  Surgeon: Lajuana Matte, MD;  Location: [place];  Service: Open Heart Surgery;  Laterality: N/A;   EYE SURGERY     LEFT HEART CATH AND CORONARY ANGIOGRAPHY N/A 04/22/2021   Procedure: LEFT HEART CATH AND CORONARY ANGIOGRAPHY;  Surgeon: Belva Crome, MD;  Location: [place] [facility];  Service: Cardiovascular;  Laterality: N/A;   POLYPECTOMY  2015   RADIAL ARTERY HARVEST Left 06/04/2021   Procedure: RADIAL ARTERY HARVEST;  Surgeon: Lajuana Matte, MD;  Location: [place];  Service: Open  Heart Surgery;  Laterality: Left;   TEE WITHOUT CARDIOVERSION N/A 06/04/2021   Procedure: TRANSESOPHAGEAL ECHOCARDIOGRAM (TEE);  Surgeon: Lajuana Matte, MD;  Location: [place];  Service: Open Heart Surgery;  Laterality: N/A;   TONSILLECTOMY       Current Outpatient Medications  Medication Sig Dispense Refill   amLODipine (NORVASC) 2.5 MG tablet  Take 1 tablet (2.5 mg total) by mouth daily. 30 tablet 1   aspirin EC 325 MG EC tablet Take 1 tablet (325 mg total) by mouth daily. 30 tablet 0   colchicine 0.6 MG tablet Take 1 tablet (0.6 mg total) by mouth 2 (two) times daily. 180 tablet 3   Cranberry-Vitamin C 250-60 MG CAPS Take 2 tablets by mouth daily.     D-MANNOSE PO Take 2 tablets by mouth daily.     diphenhydrAMINE (BENADRYL) 25 MG tablet Take 25 mg by mouth at bedtime.     FIBER PO Take 2 tablets by mouth daily.     linaclotide (LINZESS) 290 MCG CAPS capsule Take 290 mcg by mouth daily before breakfast.     metoprolol succinate (TOPROL XL) 25 MG 24 hr tablet Take 0.5 tablets (12.5 mg total) by mouth daily. 15 tablet 4   nitrofurantoin, macrocrystal-monohydrate, (MACROBID) 100 MG capsule Take 100 mg by mouth daily as needed (after intercourse).     NON FORMULARY Take 0.5 tablets by mouth at bedtime. CBD oil - for pain Gummies     omeprazole (PRILOSEC) 40 MG capsule TAKE 1 CAPSULE BY MOUTH EVERY DAY (Patient taking differently: Take 40 mg by mouth daily as needed (heartburn).) 90 capsule 1   Polyethyl Glycol-Propyl Glycol (SYSTANE OP) Place 1 drop into both eyes in the morning and at bedtime.     PREMARIN vaginal cream Place 0.93 Applicatorfuls vaginally 2 (two) times a week. (Patient taking differently: Place 0.5 g vaginally See admin instructions. Insert 0.5 grams vaginally every other night) 30 g 0   rosuvastatin (CRESTOR) 10 MG tablet Take 10 mg by mouth every other day.      No current facility-administered medications for this visit.    Allergies:   Amoxicillin, Clopidogrel & aspirin, Atorvastatin, Crestor [rosuvastatin], Metoprolol, and Pravastatin    Social History:  The patient  reports that she has never smoked. She has never used smokeless tobacco. She reports that she does not currently use alcohol. She reports that she does not use drugs.   Family History:  The patient's family history includes Cancer in her paternal  aunt; Heart attack in her father and mother; Heart disease in her father and mother; Hypertension in her mother; Rectal cancer (age of onset: 72) in her father; Stomach cancer in her paternal grandmother.    ROS:  Please see the history of present illness.   Otherwise, review of systems are positive for none.   All other systems are reviewed and negative.    PHYSICAL EXAM: VS:  BP 100/60   Pulse 71   Ht 5\' 3"  (1.6 m)   Wt 102 lb 9.6 oz (46.5 kg)   LMP  (LMP Unknown)   SpO2 97%   BMI 18.17 kg/m  , BMI Body mass index is 18.17 kg/m. GEN: Well nourished, well developed, in no acute distress HEENT: sclera anicteric Neck: no JVD, carotid bruits, or masses Cardiac: RRR; no murmurs, rubs, or gallops, no edema ; sternal incision site and artery/vein graft harvest sites are healing well Respiratory:  clear to auscultation bilaterally, normal work of breathing GI: soft, nontender,  nondistended, + BS MS: no deformity or atrophy Skin: warm and dry, no rash Neuro:  Strength and sensation are intact Psych: euthymic mood, full affect   EKG:  EKG is ordered today. The ekg ordered today demonstrates sinus rhythm with rate 71 bpm, non-specific T wave abnormalities, no STE/D.    Recent Labs: 05/31/2021: ALT 14 06/05/2021: Magnesium 2.3 06/07/2021: BUN 10; Creatinine, Ser 0.69; Hemoglobin 8.5; Platelets 307; Potassium 4.6; Sodium 137    Lipid Panel    Component Value Date/Time   CHOL 132 02/01/2021 1146   CHOL 121 06/28/2020 1150   TRIG 77.0 02/01/2021 1146   HDL 40.70 02/01/2021 1146   HDL 38 (L) 06/28/2020 1150   CHOLHDL 3 02/01/2021 1146   VLDL 15.4 02/01/2021 1146   LDLCALC 76 02/01/2021 1146   LDLCALC 62 06/28/2020 1150      Wt Readings from Last 3 Encounters:  06/18/21 102 lb 9.6 oz (46.5 kg)  06/09/21 100 lb 9.6 oz (45.6 kg)  05/31/21 105 lb 6.4 oz (47.8 kg)      Other studies Reviewed: Additional studies/ records that were reviewed today include:   Echocardiogram  04/2021: 1. Left ventricular ejection fraction, by estimation, is 55 to 60%. The  left ventricle has normal function. The left ventricle demonstrates  regional wall motion abnormalities (see scoring diagram/findings for  description). Left ventricular diastolic  parameters are consistent with Grade I diastolic dysfunction (impaired  relaxation). The average left ventricular global longitudinal strain is  -12.6 %. The global longitudinal strain is abnormal.   2. Right ventricular systolic function is normal. The right ventricular  size is normal.   3. The mitral valve is normal in structure. No evidence of mitral valve  regurgitation. No evidence of mitral stenosis.   4. The aortic valve is tricuspid. Aortic valve regurgitation is not  visualized. No aortic stenosis is present.   5. The inferior vena cava is normal in size with greater than 50%  respiratory variability, suggesting right atrial pressure of 3 mmHg.   LHC 04/2021:   60% distal left main   40 to 50% proximal LAD followed by eccentric 70% stenosis in the proximal to mid segment.  First diagonal contains segmental 70 to 80% proximal stenosis.   Circumflex is free of critical obstructive disease.   RCA contains total occlusion of the PDA and a left ventricular branch that is supplied by left to right collaterals.   LVEDP was normal at 4 mmHg.  LV function is normal with EF 65%.   Discussed with referring cardiologist, Dr. Eleonore Chiquito. Coronary bypass grafting appears to be the treatment of choice given coronary anatomy, young age, and otherwise good long-term prognosis. Will arrange outpatient surgical consultation.    ASSESSMENT AND PLAN:  1. CAD s/p CABG 06/04/21: recovering well. Increasing walking at home without anginal complaints - Continue aspirin 325mg  daily - anticipate reducing to 81mg  daily after 1 month per TCTS - Continue statin - Continue Bblocker - Continue amlodipine given radial artery harvest  2. HTN:  BP 100/60 today. She describes some dizziness and fuzzy headed sensations since discharge. Amlodipine is new in light of radial artery harvest. Suspect some degree of symptomatic hypotension - Will stop metoprolol tartrate 25mg  BID and transition to metoprolol succinate 12.5mg  daily to allow more room in BP - Continue amlodipine and metoprolol  3. HLD: LDL 76 05/2021; intolerant to higher dose statins. Has seen Dr. Debara Pickett in lipid clinic. She is asking about injectable medications today - Continue crestor  QOD - Will get appt with Dr. Debara Pickett for PCSK9-inhibitor  4. History of mediterranean fever: on colchicine following CABG - Continue colchicine per TCTS   Current medicines are reviewed at length with the patient today.  The patient does not have concerns regarding medicines.  The following changes have been made:  As above  Labs/ tests ordered today include:   Orders Placed This Encounter  Procedures   EKG 12-Lead     Disposition:   FU with Dr. Audie Box in 3 months  Signed, Abigail Butts, PA-C  06/18/2021 1:28 PM

## 2021-06-18 NOTE — Patient Instructions (Signed)
Medication Instructions:  STOP METOPROLOL TART  START METOPROLOL SUCC 12.5MG  DAILY  *If you need a refill on your cardiac medications before your next appointment, please call your pharmacy*  Lab Work:   Testing/Procedures:  NONE    NONE  Follow-Up: Your next appointment:  3 month(s) In Person with Skeet Latch, MD K. Mali Hilty, MD FOR LIPID CLINIC  At [facility], you and your health needs are our priority.  As part of our continuing mission to provide you with exceptional heart care, we have created designated Provider Care Teams.  These Care Teams include your primary Cardiologist (physician) and Advanced Practice Providers (APPs -  Physician Assistants and Nurse Practitioners) who all work together to provide you with the care you need, when you need it.

## 2021-06-19 ENCOUNTER — Ambulatory Visit: Payer: Medicare Other | Admitting: Medical

## 2021-06-20 ENCOUNTER — Telehealth: Payer: Self-pay | Admitting: Cardiovascular Disease

## 2021-06-20 ENCOUNTER — Encounter: Payer: Self-pay | Admitting: Medical

## 2021-06-20 NOTE — Telephone Encounter (Signed)
Pt c/o medication issue:  1. Name of Medication: amLODipine (NORVASC) 2.5 MG tablet  2. How are you currently taking this medication (dosage and times per day)? As directed  3. Are you having a reaction (difficulty breathing--STAT)? yes  4. What is your medication issue? Patient states that this medication is causing her to have dizzy and nausea spells. She states that her head feels like it is spinning all day and night. She would like to change the medication and also be prescribed something for the nausea. Denies SOB or feeling like she is going to pass out.

## 2021-06-20 NOTE — Telephone Encounter (Signed)
Returned call to patient of Dr. Audie Box who reports severe dizziness yesterday, like "her head wasn't straight". She also reports nausea.   BP yesterday 104/55 BP this morning, systolic was in 24E and then 104  She saw [place] PA on 10/11 - discussed dizziness but it was not as bad as it was yesterday She was prescribed metoprolol succinate 12.5mg  daily on 10/11  Will route to MD to advise on meds, low BP

## 2021-06-20 NOTE — Telephone Encounter (Signed)
Stop all blood pressure medications.  Encourage adequate hydration.  She needs to let us know if she is no better in the next 24 to 48 hours.  She may need to go to the emergency room.  I believe she is just on too much blood pressure medications.  Stop metoprolol and amlodipine.    [place] T. Audie Box, MD, [facility]   [address] [place]  [place], [place] [address]  [phone]   11:11 AM    Spoke with patient and relayed MD advice to STOP amlodipine and metoprolol succinate, hydrate well. She was asked to call back tomorrow afternoon with an update on how she is feeling. She reports feeling like her heart was racing so she took meto succ 12.5mg  this afternoon. Advised her not to take anymore, check BP pulse and can call back tomorrow. She voiced understanding.

## 2021-06-21 ENCOUNTER — Telehealth: Payer: Self-pay | Admitting: Cardiovascular Disease

## 2021-06-21 ENCOUNTER — Ambulatory Visit (INDEPENDENT_AMBULATORY_CARE_PROVIDER_SITE_OTHER): Payer: Self-pay | Admitting: Thoracic Surgery (Cardiothoracic Vascular Surgery)

## 2021-06-21 ENCOUNTER — Telehealth: Payer: Self-pay | Admitting: Medical

## 2021-06-21 ENCOUNTER — Other Ambulatory Visit: Payer: Self-pay

## 2021-06-21 DIAGNOSIS — Z951 Presence of aortocoronary bypass graft: Secondary | ICD-10-CM

## 2021-06-21 MED ORDER — ONDANSETRON 4 MG PO TBDP: 4.0000 mg | ORAL_TABLET | Freq: Three times a day (TID) | ORAL | 0 refills | Status: DC | PRN

## 2021-06-21 NOTE — Telephone Encounter (Signed)
She call to report she is feeling much better after she got off the BP medication.  She is still taking 1/2 tablet of metoprolol succinate (TOPROL XL) 25 MG 24 hr tablet.

## 2021-06-21 NOTE — Telephone Encounter (Signed)
Spoke with pt regarding phone call she made to our office yesterday. Pt was having a reaction and we told to stop taking her blood pressure medication. Pt was told to call our office back today and let us know how she is doing,. Pt reports feeling so much better today. Pt does say that she took metoprolol succinate 12.5mg  today. Blood pressure while on the phone is 130/83, HR- 106. Will forward to Dr. Audie Box.

## 2021-06-21 NOTE — Telephone Encounter (Signed)
Sent in rx zofran per pt my chart request.

## 2021-06-21 NOTE — Telephone Encounter (Addendum)
Per Dr. Audie Box: OK to continue metoprolol. The HR bothers me. Tell her to keep an eye on it. ECG showed sinus rhythm earlier this week. Just had CABG so need to keep a look for Afib.   -Spoke with pt relayed message per Dr. Audie Box. Instructed pt to monitor heart rate and call us back with any concerns. Pt states most recent blood pressure is 111/56. Pt verbalizes understanding.

## 2021-06-21 NOTE — Progress Notes (Signed)
     [address]       [place] [address]             [phone]       Patient: Home Provider: Office Consent for Telemedicine visit obtained.  Today's visit was completed via a real-time telehealth (see specific modality noted below). The patient/authorized person provided oral consent at the time of the visit to engage in a telemedicine encounter with the present provider at [facility]. The patient/authorized person was informed of the potential benefits, limitations, and risks of telemedicine. The patient/authorized person expressed understanding that the laws that protect confidentiality also apply to telemedicine. The patient/authorized person acknowledged understanding that telemedicine does not provide emergency services and that he or she would need to call 911 or proceed to the nearest hospital for help if such a need arose.   Total time spent in the clinical discussion 10 minutes.  Telehealth Modality: Phone visit (audio only)  I had a telephone visit with Katrina Walters.  She is s/p CABG.  Overall doing well.  She had some low blood pressures, and cardiology made some adjustments to her meds.  Her blood pressure since has been on the lower side.  She has not been keeping herself well hydrated.  She is walking 46min daily.    1 month f/u with a CXR.

## 2021-06-25 ENCOUNTER — Ambulatory Visit: Payer: Medicare Other | Admitting: Internal Medicine

## 2021-06-26 ENCOUNTER — Ambulatory Visit: Payer: Medicare Other | Admitting: Medical

## 2021-06-26 ENCOUNTER — Telehealth: Payer: Self-pay

## 2021-06-26 NOTE — Telephone Encounter (Signed)
I called the pt to ask how we could help and they stated that they wanted to try leqvio so I made her an appt for the pharmd 10/27 at 230pm

## 2021-06-26 NOTE — Telephone Encounter (Signed)
-----   Message from Rollen Sox, [facility] sent at 06/26/2021 11:25 AM EDT ----- Regarding: RE: repatha sureclick Can you call her to see if she is taking it?  ----- Message ----- From: Allean Found, CMA Sent: 06/26/2021  11:15 AM EDT To: Rollen Sox, RPH Subject: repatha sureclick                              Not to question you sir.... I already have her approved so is an appt needed?  REPATHA SURE INJ 140MG /ML is approved through 09/07/2021.  ----- Message ----- From: Rollen Sox, [facility] Sent: 06/26/2021  10:29 AM EDT To: Allean Found, CMA  Can you call to schedule lipid appt for [facility] or [facility]?  ----- Message ----- From: Caprice Beaver, LPN Sent: 95/18/8416   2:46 PM EDT To: Rockne Menghini, RPH-CPP, #  Hey y'all, I just sent over a referral for this patient. She seen [place] today- and they discussed Repatha, Dr.O'Neal would like her to see you guys to get this started.  Just wanted to let you know!   Thank you!

## 2021-07-01 ENCOUNTER — Other Ambulatory Visit: Payer: Self-pay | Admitting: Surgical

## 2021-07-01 ENCOUNTER — Telehealth: Payer: Self-pay | Admitting: Cardiovascular Disease

## 2021-07-01 NOTE — Telephone Encounter (Signed)
Called and got patient scheduled for appointment in November to come discuss.  Patient aware to stop Metoprolol, verbalized understanding.

## 2021-07-01 NOTE — Telephone Encounter (Signed)
Pt c/o medication issue:  1. Name of Medication: metoprolol succinate (TOPROL XL) 25 MG 24 hr tablet  2. How are you currently taking this medication (dosage and times per day)? Take 0.5 tablets (12.5 mg total) by mouth daily.  3. Are you having a reaction (difficulty breathing--STAT)? no  4. What is your medication issue? Patient wants to know can she stop taking this medication. Its causing her to itch real bad. Please advise

## 2021-07-01 NOTE — Telephone Encounter (Signed)
Called patient, she states that since starting the Metoprolol 12.5 mg daily, she has been itching day and night. She states that this is getting worse and would like to stop the medication to see if it gets better, but she did not know if it was safe to stop. She does not remember if she had this same reaction on the Tartrate- or if it was this bad.   I advised I would route message to MD to review and give recommendations.  Thanks!

## 2021-07-04 ENCOUNTER — Telehealth: Payer: Self-pay | Admitting: Pharmacist

## 2021-07-04 ENCOUNTER — Ambulatory Visit (INDEPENDENT_AMBULATORY_CARE_PROVIDER_SITE_OTHER): Payer: Medicare Other | Admitting: Pharmacist

## 2021-07-04 ENCOUNTER — Other Ambulatory Visit: Payer: Self-pay

## 2021-07-04 ENCOUNTER — Encounter: Payer: Self-pay | Admitting: Pharmacist

## 2021-07-04 VITALS — BP 130/78 | HR 115 | Resp 16 | Ht 63.0 in | Wt 98.0 lb

## 2021-07-04 DIAGNOSIS — I25118 Atherosclerotic heart disease of native coronary artery with other forms of angina pectoris: Secondary | ICD-10-CM | POA: Diagnosis not present

## 2021-07-04 DIAGNOSIS — E785 Hyperlipidemia, unspecified: Secondary | ICD-10-CM

## 2021-07-04 MED ORDER — PRALUENT 75 MG/ML ~~LOC~~ SOAJ: 75.0000 mg | SUBCUTANEOUS | 11 refills | Status: DC

## 2021-07-04 NOTE — Telephone Encounter (Signed)
Called and spoke to pt lo let them know praluent 75 q14d approved, rx sent, fasting lab ordered for post 4th dose, instructed pt to call if unaffordable, pt voiced understanding

## 2021-07-04 NOTE — Progress Notes (Signed)
Patient ID: Katrina Walters                 DOB: Jan 07, 1956                    MRN: [id number]     HPI: Katrina Walters is a 65 y.o. female patient referred to lipid clinic by Dr Audie Box. PMH is significant for CAD, HLD, s/p CABG x 3, HTN, angina, and multiple medication intolerances.  Patient had CABG x 4 on 06/04/21 and seen by Roby Lofts on 06/18/21 for follow up.  Started on metoprolol but patient has since discontinued.  Referred to lipid clinic.  Patient presents today with husband. Reports multiple medications, including rosuvastatin and Repatha make her itch.  Itching is not superficial, she describes it feels as if it is coming from er nerves.  This has happened frequently with many other medications as well which causes her to stop taking them.  Expressed interest in starting Leqvio.  Current Medications: Rosuvastatin 10mg  every 2 or 3 days  Intolerances:  Repatha (itching) Pravastatin Zetia Nexletol  Risk Factors: CABG, HTN LDL goal: <70  Labs: LDL 76, Trigs 77, HDL 40, TC 132 (02/01/21)  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 78 years in [place], managed on colchicine   PONV (postoperative nausea and vomiting)     Current Outpatient Medications on File Prior to Visit  Medication Sig Dispense Refill   amLODipine (NORVASC) 2.5 MG tablet Take 1 tablet (2.5 mg total) by mouth daily. 30 tablet 1   aspirin EC 325 MG EC tablet Take 1 tablet (325 mg total) by mouth daily. 30 tablet 0   colchicine 0.6 MG tablet Take 1 tablet (0.6 mg total) by mouth 2 (two) times daily. 180 tablet 3   Cranberry-Vitamin C 250-60 MG CAPS Take 2 tablets by mouth daily.     D-MANNOSE PO Take 2 tablets by mouth daily.     diphenhydrAMINE (BENADRYL) 25 MG tablet Take 25 mg by mouth at bedtime.     FIBER PO Take 2 tablets by mouth daily.     linaclotide  (LINZESS) 290 MCG CAPS capsule Take 290 mcg by mouth daily before breakfast.     metoprolol succinate (TOPROL XL) 25 MG 24 hr tablet Take 0.5 tablets (12.5 mg total) by mouth daily. 15 tablet 4   nitrofurantoin, macrocrystal-monohydrate, (MACROBID) 100 MG capsule Take 100 mg by mouth daily as needed (after intercourse).     NON FORMULARY Take 0.5 tablets by mouth at bedtime. CBD oil - for pain Gummies     omeprazole (PRILOSEC) 40 MG capsule TAKE 1 CAPSULE BY MOUTH EVERY DAY (Patient taking differently: Take 40 mg by mouth daily as needed (heartburn).) 90 capsule 1   ondansetron (ZOFRAN ODT) 4 MG disintegrating tablet Take 1 tablet (4 mg total) by mouth every 8 (eight) hours as needed for nausea or vomiting. 20 tablet 0   Polyethyl Glycol-Propyl Glycol (SYSTANE OP) Place 1 drop into both eyes in the morning and at bedtime.     PREMARIN vaginal cream Place 8.50 Applicatorfuls vaginally 2 (two) times a week. (Patient taking differently: Place 0.5 g vaginally See admin instructions. Insert 0.5 grams vaginally every other night) 30 g 0   rosuvastatin (CRESTOR) 10 MG tablet Take 10 mg  by mouth every other day.      No current facility-administered medications on file prior to visit.    Allergies  Allergen Reactions   Amoxicillin     Had fever with flu like symptoms. Has patient had a PCN reaction causing immediate rash, facial/tongue/throat swelling, SOB or lightheadedness with hypotension: no Has patient had a PCN reaction causing severe rash involving mucus membranes or skin necrosis: no Has patient had a PCN reaction that required hospitalization no Has patient had a PCN reaction occurring within the last 10 years: yes If all of the above answers are "NO", then may proceed with Cephalosporin use.   Clopidogrel & Aspirin Itching   Atorvastatin     Myalgia   Crestor [Rosuvastatin] Itching   Metoprolol Other (See Comments)    ?possibly caused hair loss   Pravastatin Itching     Assessment/Plan:  1. Hyperlipidemia - Patient LDL 76 in May 2022 which is slightly above goal of <70 however patient is not tolerant of many medications and is high risk for future events.  While discussing options with patient, recommended starting Praluent 75mg  first since she had not tried it before.  Patient is agreeable.  Had patient fill out and sign Leqvio enrollment form as well in case she has adverse effects to Praluent.  Will complete PA and contact patient when approved.  Recheck lipid panel in 2-3 months.  Karren Cobble, PharmD, BCACP, [place], [place], [place], [place], [address] Phone: [phone], Fax: [phone]

## 2021-07-04 NOTE — Telephone Encounter (Signed)
Please complete prior authorization for:  Name of medication, dose, and frequency Praluent 75mg  sq q 14 days  Lab Orders Requested? yes  Which labs? Lipid panel  Estimated date for labs to be scheduled 2-3 months  Does patient need activated copay card? no

## 2021-07-04 NOTE — Patient Instructions (Signed)
It was nice meeting you today  We would like your LDL (bad cholesterol) to be less than 70  Continue rosuvastatin 10mg  every other day as tolerated  We will try a new medication called Praluent 75mg  once every 2 weeks  If you tolerate it we will recheck your cholesterol levels in 2-3 months  If you can not tolerate it, we will pursue Leqvio, which is the infection you will receive over at [facility]  Please call with any questions!  Karren Cobble, PharmD, BCACP, [place], [place], [place], [place], [address] Phone: [phone], Fax: [phone]

## 2021-07-15 ENCOUNTER — Encounter: Payer: Self-pay | Admitting: Medical

## 2021-07-16 ENCOUNTER — Telehealth: Payer: Self-pay | Admitting: Medical

## 2021-07-16 ENCOUNTER — Other Ambulatory Visit: Payer: Self-pay | Admitting: Thoracic Surgery (Cardiothoracic Vascular Surgery)

## 2021-07-16 DIAGNOSIS — Z124 Encounter for screening for malignant neoplasm of cervix: Secondary | ICD-10-CM

## 2021-07-16 DIAGNOSIS — Z951 Presence of aortocoronary bypass graft: Secondary | ICD-10-CM

## 2021-07-16 NOTE — Telephone Encounter (Signed)
Chart opened to rx med, order lab, review chart, respond to my chart message or send message to staff member  

## 2021-07-19 ENCOUNTER — Ambulatory Visit (INDEPENDENT_AMBULATORY_CARE_PROVIDER_SITE_OTHER): Payer: Self-pay | Admitting: Surgical

## 2021-07-19 ENCOUNTER — Ambulatory Visit
Admission: RE | Admit: 2021-07-19 | Discharge: 2021-07-19 | Disposition: A | Discharge status: Home / Self Care | Payer: Medicare Other | Source: Ambulatory Visit | Attending: Thoracic Surgery (Cardiothoracic Vascular Surgery) | Admitting: Thoracic Surgery (Cardiothoracic Vascular Surgery)

## 2021-07-19 ENCOUNTER — Other Ambulatory Visit: Payer: Self-pay

## 2021-07-19 ENCOUNTER — Encounter: Payer: Medicare Other | Admitting: Thoracic Surgery (Cardiothoracic Vascular Surgery)

## 2021-07-19 VITALS — BP 100/68 | HR 96 | Resp 20 | Ht 63.0 in | Wt 102.0 lb

## 2021-07-19 DIAGNOSIS — Z951 Presence of aortocoronary bypass graft: Secondary | ICD-10-CM

## 2021-07-19 NOTE — Patient Instructions (Signed)
Discussed activity progression and heart healthy lifestyle and diet

## 2021-07-19 NOTE — Progress Notes (Signed)
[address]       [place] [address]             [phone]      Netanya Sowash [place] Medical Record #[id number] Date of Birth: 06/26/1956  Referring: Geralynn Rile, * Primary Care: Mackie Pai, [place] Primary Cardiologist: Evalina Field, MD   Chief Complaint:   POST OP FOLLOW UP 06/04/2021 Patient:  Katrina Walters Pre-Op Dx: Three-vessel coronary artery disease                         Hypertension Post-op Dx: Same Procedure: CABG X 4, LIMA LAD, reverse saphenous vein graft to PDA, reverse saphenous vein graft to obtuse marginal, reverse saphenous vein graft to diagonal branch Endoscopic greater saphenous vein harvest on the right Left radial artery harvest     Surgeon and Role:      * Lightfoot, Lucile Crater, MD - Primary    Evonnie Pat, PA-C- assisting Assistant: Macarthur Critchley, PA-C  History of Present Illness:    The patient is a 65 year old female seen in the office on today's date and routine postsurgical follow-up status post the above procedure.  She reports that overall she is doing well.  Her activity level has shown steady improvement as has her pain control.  She has mild discomfort at times but it is minimal and she is no longer taking pain medication.  She denies fevers, chills.  She does have significant intolerance to medication especially those related to cholesterol which causes itching.  She is also developed dizziness that she has related to the amlodipine use for radial artery harvest as well as metoprolol and has subsequently stopped taking them.  She is not having any difficulty with her incisions.  Overall she is quite pleased with her progress.      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 Has patient had a PCN reaction causing immediate rash, facial/tongue/throat swelling, SOB or lightheadedness with hypotension: no Has patient had a PCN reaction causing severe rash involving mucus membranes or skin necrosis: no Has patient had a PCN reaction that required hospitalization no Has patient had a PCN reaction occurring within the last 10 years: yes If all of the above answers are "NO", then may proceed with Cephalosporin use.   Clopidogrel & Aspirin Itching   Amlodipine     Dizzy, heavy feeling, nauseated, tongue burning   Atorvastatin     Myalgia   Crestor [Rosuvastatin] Itching   Metoprolol Other (See Comments)    ?possibly caused hair loss   Pravastatin Itching   Repatha [Evolocumab]     itching    Current Outpatient Medications  Medication Sig Dispense Refill   Alirocumab (PRALUENT) 75 MG/ML SOAJ Inject 75 mg into the skin every  14 (fourteen) days. 2 mL 11   aspirin EC 325 MG EC tablet Take 1 tablet (325 mg total) by mouth daily. 30 tablet 0   colchicine 0.6 MG tablet Take 1 tablet (0.6 mg total) by mouth 2 (two) times daily. 180 tablet 3   Cranberry-Vitamin C 250-60 MG CAPS Take 2 tablets by mouth daily.     D-MANNOSE PO Take 2 tablets by mouth daily.     diphenhydrAMINE (BENADRYL) 25 MG tablet Take 25 mg by mouth at bedtime.     FIBER PO Take 2 tablets by mouth daily.     linaclotide (LINZESS) 290 MCG CAPS capsule Take 290 mcg by mouth daily before breakfast.     NON FORMULARY Take 0.5 tablets by mouth at bedtime. CBD oil - for pain Gummies     omeprazole (PRILOSEC) 40 MG capsule TAKE 1 CAPSULE BY MOUTH EVERY DAY (Patient taking differently: Take  40 mg by mouth daily as needed (heartburn).) 90 capsule 1   ondansetron (ZOFRAN ODT) 4 MG disintegrating tablet Take 1 tablet (4 mg total) by mouth every 8 (eight) hours as needed for nausea or vomiting. 20 tablet 0   Polyethyl Glycol-Propyl Glycol (SYSTANE OP) Place 1 drop into both eyes in the morning and at bedtime.     PREMARIN vaginal cream Place 7.78 Applicatorfuls vaginally 2 (two) times a week. (Patient taking differently: Place 0.5 g vaginally See admin instructions. Insert 0.5 grams vaginally every other night) 30 g 0   rosuvastatin (CRESTOR) 10 MG tablet Take 10 mg by mouth every other day.      nitrofurantoin, macrocrystal-monohydrate, (MACROBID) 100 MG capsule Take 100 mg by mouth daily as needed (after intercourse).     No current facility-administered medications for this visit.       Physical Exam: Ht 5\' 3"  (1.6 m)   LMP  (LMP Unknown)   BMI 17.36 kg/m   General appearance: alert, cooperative, and no distress Heart: regular rate and rhythm Lungs: clear to auscultation bilaterally Abdomen: Benign exam Extremities: No edema Wound: Incisions well-healed without evidence of infection   Diagnostic Studies & Laboratory data:     Recent Radiology Findings:   DG Chest 2 View  Result Date: 07/19/2021 CLINICAL DATA:  Status post CABG. EXAM: CHEST - 2 VIEW COMPARISON:  06/07/2021 FINDINGS: Lungs are hyperexpanded but clear. Nipple shadows project over each lung base. The cardiopericardial silhouette is within normal limits for size. The visualized bony structures of the thorax show no acute abnormality. IMPRESSION: No active cardiopulmonary disease. Electronically Signed   By: Misty Stanley M.D.   On: 07/19/2021 12:40      Recent Lab Findings: Lab Results  Component Value Date   WBC 13.0 (H) 06/07/2021   HGB 8.5 (L) 06/07/2021   HCT 29.9 (L) 06/07/2021   PLT 307 06/07/2021   GLUCOSE 95 06/07/2021   CHOL 132 02/01/2021   TRIG 77.0 02/01/2021   HDL 40.70 02/01/2021    LDLCALC 76 02/01/2021   ALT 14 05/31/2021   AST 21 05/31/2021   NA 137 06/07/2021   K 4.6 06/07/2021   CL 109 06/07/2021   CREATININE 0.69 06/07/2021   BUN 10 06/07/2021   CO2 23 06/07/2021   TSH 2.99 05/15/2016   INR 1.5 (H) 06/04/2021   HGBA1C 5.4 05/31/2021      Assessment / Plan: Patient is doing quite well.  There are no specific surgical issues.  She has a scheduled office appointment with cardiology on 22 November.  I  have not made any changes to her current medication regimen.  She is fine to begin in any time.  She generally feels like she is doing all the stuff she is wanting to do an ambulation is increasing as well as endurance and overall exercise tolerance.  We discussed heart healthy diet and lifestyle modifications.  We will see the patient again on a as needed basis for any surgically related issues or at request.      Medication Changes: No orders of the defined types were placed in this encounter.     John Giovanni, PA-C  07/19/2021 1:16 PM

## 2021-07-24 ENCOUNTER — Ambulatory Visit (INDEPENDENT_AMBULATORY_CARE_PROVIDER_SITE_OTHER): Payer: Medicare Other | Admitting: Medical

## 2021-07-24 ENCOUNTER — Other Ambulatory Visit: Payer: Self-pay

## 2021-07-24 VITALS — BP 137/74 | HR 101 | Resp 18 | Ht 63.0 in | Wt 101.2 lb

## 2021-07-24 DIAGNOSIS — D649 Anemia, unspecified: Secondary | ICD-10-CM | POA: Diagnosis not present

## 2021-07-24 DIAGNOSIS — R000 Tachycardia, unspecified: Secondary | ICD-10-CM

## 2021-07-24 DIAGNOSIS — R1013 Epigastric pain: Secondary | ICD-10-CM

## 2021-07-24 DIAGNOSIS — Z951 Presence of aortocoronary bypass graft: Secondary | ICD-10-CM

## 2021-07-24 MED ORDER — PANTOPRAZOLE SODIUM 40 MG PO TBEC: 40.0000 mg | DELAYED_RELEASE_TABLET | Freq: Every day | ORAL | 3 refills | Status: DC

## 2021-07-24 NOTE — Patient Instructions (Addendum)
Recent epigastric pain that you describe got worse after your recent surgery when you were taking aspirin 325 mg daily.  Now you are on low-dose 81 mg daily.  You mention that your stomach felt better with dose reduction.  You also report recently that omeprazole seems to help reduce your abdomen pain but not completely.  Recommend stopping omeprazole and will prescribe Protonix in place of.  Wanted to prescribe famotidine as add-on and add Carafate.  However epic brought up allergic reaction warning and you have strong allergy history.  With recent more severe abdomen pain and recently new anemia, I am going to get a CBC, iron panel and ordered Ifob stool test for blood.  Please get the labs done today.  Return in the stool test tomorrow morning.  After review of lab work and stool test we will place referral to gastroenterologist.  If hemoglobin dropping and stool positive for blood will attempt to expedite a GI appointment.  You do have history of mild tachycardia.  Unfortunately unable to tolerate beta-blocker due to BP level dropped.  Some of your tachycardia recently might be due to anemia.  Follow-up with your cardiologist next week.  Follow-up with me in 10 days or sooner if needed.

## 2021-07-24 NOTE — Addendum Note (Signed)
Addended by: Manuela Schwartz on: 07/24/2021 02:35 PM   Modules accepted: Orders

## 2021-07-24 NOTE — Progress Notes (Signed)
Subjective:    Patient ID: Katrina Walters, female    DOB: 1956/05/10, 65 y.o.   MRN: [id number]  HPI She update me that she has appointment with cardiologist on July 30, 2021.  A/P from cardiothoracic surgeon.(Recent CABG) "Assessment / Plan: Patient is doing quite well.  There are no specific surgical issues.  She has a scheduled office appointment with cardiology on 22 November.  I have not made any changes to her current medication regimen.  She is fine to begin in any time.  She generally feels like she is doing all the stuff she is wanting to do an ambulation is increasing as well as endurance and overall exercise tolerance.  We discussed heart healthy diet and lifestyle modifications.  We will see the patient again on a as needed basis for any surgically related issues or at request."   Cardiologist appointment 07-04-2021 A/P below in "  Assessment/Plan:   1. Hyperlipidemia - Patient LDL 76 in May 2022 which is slightly above goal of <70 however patient is not tolerant of many medications and is high risk for future events.  While discussing options with patient, recommended starting Praluent 75mg  first since she had not tried it before.  Patient is agreeable.  Had patient fill out and sign Leqvio enrollment form as well in case she has adverse effects to Praluent.  Will complete PA and contact patient when approved.  Recheck lipid panel in 2-3 months.  Pt also states she had possible reaction to metoprolol as well. In past was given for tachycardia. But she states dropped her blood pressure excessively.   Pt in for some recent abdomen pain. Pt states her stomach as been very upset. She tells me that whenever she eats feels full and nauseated. She states when she uses omeprazole it helps. Yesterday she skippped omeprazole and states she had felt like she had a brick in her stomach. She started back on omeprazole and felt better. Also recently has appearance of clear phlerm in morning  after waking. This clear phlem makes her cough.    She has some recent mild anemia. Hemoglobin 06/05/2021 was 8.3 05/31/21 hb was 13.7 and hct was 49.2. pt tells me she had upset stomach when she was taking aspirin 325 mg daily. Now on 81 mg daily. After surgery upset stomach began.     Review of Systems  Constitutional:  Negative for chills, diaphoresis, fatigue and fever.  Respiratory:  Negative for chest tightness, shortness of breath and wheezing.   Cardiovascular:  Negative for chest pain and palpitations.       Occasionally tachycardia.  Gastrointestinal:  Positive for abdominal pain. Negative for diarrhea, nausea and vomiting.  Genitourinary:  Negative for dysuria.  Musculoskeletal:  Negative for back pain and joint swelling.  Skin:  Negative for rash.  Neurological:  Negative for dizziness, syncope, weakness, numbness and headaches.  Hematological:  Negative for adenopathy. Does not bruise/bleed easily.  Psychiatric/Behavioral:  Negative for behavioral problems, decreased concentration, dysphoric mood and hallucinations.     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 71 years in [place], managed on colchicine   PONV (postoperative nausea and vomiting)      Social History   Socioeconomic History   Marital status: Married    Spouse name: Not on file  Number of children: Not on file   Years of education: Not on file   Highest education level: Not on file  Occupational History   Occupation: unemployed at the time  Tobacco Use   Smoking status: Never   Smokeless tobacco: Never  Vaping Use   Vaping Use: Never used  Substance and Sexual Activity   Alcohol use: Not Currently    Alcohol/week: 0.0 standard drinks    Comment: occassionally   Drug use: No    Comment: CBD gummies   Sexual activity: Not on file  Other  Topics Concern   Not on file  Social History Narrative   Not on file   Social Determinants of Health   Financial Resource Strain: Not on file  Food Insecurity: Not on file  Transportation Needs: Not on file  Physical Activity: Not on file  Stress: Not on file  Social Connections: Not on file  Intimate Partner Violence: Not on file    Past Surgical History:  Procedure Laterality Date   ABDOMINAL HYSTERECTOMY  08/2019   ANAL RECTAL MANOMETRY N/A 11/12/2020   Procedure: ANO RECTAL MANOMETRY;  Surgeon: Lavena Bullion, DO;  Location: [facility];  Service: Gastroenterology;  Laterality: N/A;   BLADDER SURGERY  08/2019   BREAST REDUCTION SURGERY  2006   CARDIAC CATHETERIZATION N/A 07/17/2016   Procedure: Left Heart Cath and Coronary Angiography;  Surgeon: Burnell Blanks, MD;  Location: [facility];  Service: Cardiovascular;  Laterality: N/A;   CATARACT EXTRACTION W/ INTRAOCULAR LENS IMPLANT Bilateral    COLONOSCOPY  2015   CORONARY ARTERY BYPASS GRAFT N/A 06/04/2021   Procedure: CORONARY ARTERY BYPASS GRAFTING (CABG), ON PUMP, TIMES FOUR, USING LEFT INTERNAL MAMMARY ARTERY, LEFT RADIAL ARTERY (HARVESTED OPEN), AND RIGHT GREATER SAPHENOUS VEIN (HARVESTED ENDOSCOPICALLY);  Surgeon: Lajuana Matte, MD;  Location: [facility];  Service: Open Heart Surgery;  Laterality: N/A;   EYE SURGERY     LEFT HEART CATH AND CORONARY ANGIOGRAPHY N/A 04/22/2021   Procedure: LEFT HEART CATH AND CORONARY ANGIOGRAPHY;  Surgeon: Belva Crome, MD;  Location: [facility];  Service: Cardiovascular;  Laterality: N/A;   POLYPECTOMY  2015   RADIAL ARTERY HARVEST Left 06/04/2021   Procedure: RADIAL ARTERY HARVEST;  Surgeon: Lajuana Matte, MD;  Location: [facility];  Service: Open Heart Surgery;  Laterality: Left;   TEE WITHOUT CARDIOVERSION N/A 06/04/2021   Procedure: TRANSESOPHAGEAL ECHOCARDIOGRAM (TEE);  Surgeon: Lajuana Matte, MD;  Location: [facility];  Service: Open Heart Surgery;   Laterality: N/A;   TONSILLECTOMY      Family History  Problem Relation Age of Onset   Heart disease Mother        died at 69 of heart attack   Hypertension Mother    Heart attack Mother    Heart disease Father        had heart attack at age 63   Heart attack Father    Rectal cancer Father 78   Cancer Paternal Aunt    Stomach cancer Paternal Grandmother    Colon cancer Neg Hx    Colon polyps Neg Hx    Esophageal cancer Neg Hx     Allergies  Allergen Reactions   Amoxicillin     Had fever with flu like symptoms. Has patient had a PCN reaction causing immediate rash, facial/tongue/throat swelling, SOB or lightheadedness with hypotension: no Has patient had a PCN reaction causing severe rash involving mucus membranes or skin necrosis: no Has patient  had a PCN reaction that required hospitalization no Has patient had a PCN reaction occurring within the last 10 years: yes If all of the above answers are "NO", then may proceed with Cephalosporin use.   Clopidogrel & Aspirin Itching   Amlodipine     Dizzy, heavy feeling, nauseated, tongue burning   Atorvastatin     Myalgia   Crestor [Rosuvastatin] Itching   Metoprolol Other (See Comments)    ?possibly caused hair loss   Pravastatin Itching   Repatha [Evolocumab]     itching    Current Outpatient Medications on File Prior to Visit  Medication Sig Dispense Refill   aspirin 81 MG chewable tablet Chew by mouth daily.     Alirocumab (PRALUENT) 75 MG/ML SOAJ Inject 75 mg into the skin every 14 (fourteen) days. 2 mL 11   aspirin EC 325 MG EC tablet Take 1 tablet (325 mg total) by mouth daily. 30 tablet 0   colchicine 0.6 MG tablet Take 1 tablet (0.6 mg total) by mouth 2 (two) times daily. 180 tablet 3   Cranberry-Vitamin C 250-60 MG CAPS Take 2 tablets by mouth daily.     D-MANNOSE PO Take 2 tablets by mouth daily.     diphenhydrAMINE (BENADRYL) 25 MG tablet Take 25 mg by mouth at bedtime.     FIBER PO Take 2 tablets by mouth  daily.     linaclotide (LINZESS) 290 MCG CAPS capsule Take 290 mcg by mouth daily before breakfast.     nitrofurantoin, macrocrystal-monohydrate, (MACROBID) 100 MG capsule Take 100 mg by mouth daily as needed (after intercourse).     NON FORMULARY Take 0.5 tablets by mouth at bedtime. CBD oil - for pain Gummies     ondansetron (ZOFRAN ODT) 4 MG disintegrating tablet Take 1 tablet (4 mg total) by mouth every 8 (eight) hours as needed for nausea or vomiting. (Patient not taking: Reported on 07/19/2021) 20 tablet 0   Polyethyl Glycol-Propyl Glycol (SYSTANE OP) Place 1 drop into both eyes in the morning and at bedtime.     PREMARIN vaginal cream Place 4.50 Applicatorfuls vaginally 2 (two) times a week. (Patient taking differently: Place 0.5 g vaginally See admin instructions. Insert 0.5 grams vaginally every other night) 30 g 0   rosuvastatin (CRESTOR) 10 MG tablet Take 10 mg by mouth every other day.     No current facility-administered medications on file prior to visit.    BP 137/74   Pulse (!) 101   Resp 18   Ht 5\' 3"  (1.6 m)   Wt 101 lb 3.2 oz (45.9 kg)   LMP  (LMP Unknown)   SpO2 100%   BMI 17.93 kg/m       Objective:   Physical Exam   General- No acute distress. Pleasant patient. Neck- Full range of motion, no jvd Lungs- Clear, even and unlabored. Heart- regular rate and rhythm. Neurologic- CNII- XII grossly intact.  Abdomen-soft, nontender presently,  nondistended, positive bowel sounds no rebound or guarding. Back-no CVA tenderness    Assessment & Plan:   Patient Instructions  Recent epigastric pain that you describe got worse after your recent surgery when you were taking aspirin 325 mg daily.  Now you are on low-dose 81 mg daily.  You mention that your stomach felt better with dose reduction.  You also report recently that omeprazole seems to help reduce your abdomen pain but not completely.  Recommend stopping omeprazole and will prescribe Protonix in place of.  Wanted  to  prescribe famotidine as add-on and add Carafate.  However epic brought of allergic reaction warning and you have strong allergy history.  With recent more severe abdomen pain and recently new anemia, I am going to get a CBC, iron panel and ordered Ifob stool test for blood.  Please get the labs done today.  Return in the stool test tomorrow morning.  After review of lab work and stool test we will place referral to gastroenterologist.  If hemoglobin dropping and stool positive for blood will attempt to expedite a GI appointment.  You do have history of mild tachycardia.  Unfortunately unable to tolerate beta-blocker due to BP level dropped.  Some of your tachycardia recently might be due to anemia.  Follow-up with your cardiologist next week.  Follow-up with me in 10 days or sooner if needed.   Mackie Pai, PA-C

## 2021-07-25 ENCOUNTER — Encounter: Payer: Self-pay | Admitting: Medical

## 2021-07-25 ENCOUNTER — Other Ambulatory Visit (INDEPENDENT_AMBULATORY_CARE_PROVIDER_SITE_OTHER): Payer: Medicare Other

## 2021-07-25 DIAGNOSIS — D649 Anemia, unspecified: Secondary | ICD-10-CM

## 2021-07-25 LAB — COMPREHENSIVE METABOLIC PANEL
ALT: 10 U/L (ref 0–35)
AST: 15 U/L (ref 0–37)
Albumin: 4.4 g/dL (ref 3.5–5.2)
Alkaline Phosphatase: 60 U/L (ref 39–117)
BUN: 12 mg/dL (ref 6–23)
CO2: 28 mEq/L (ref 19–32)
Calcium: 9.5 mg/dL (ref 8.4–10.5)
Chloride: 102 mEq/L (ref 96–112)
Creatinine, Ser: 0.71 mg/dL (ref 0.40–1.20)
GFR: 89.85 mL/min (ref >60.00)
Glucose, Bld: 82 mg/dL (ref 70–99)
Potassium: 4.7 mEq/L (ref 3.5–5.1)
Sodium: 136 mEq/L (ref 135–145)
Total Bilirubin: 0.5 mg/dL (ref 0.2–1.2)
Total Protein: 7.1 g/dL (ref 6.0–8.3)

## 2021-07-25 LAB — CBC WITH DIFFERENTIAL/PLATELET
Basophils Absolute: 0 10*3/uL (ref 0.0–0.1)
Basophils Relative: 0.5 % (ref 0.0–3.0)
Eosinophils Absolute: 0.3 10*3/uL (ref 0.0–0.7)
Eosinophils Relative: 2.9 % (ref 0.0–5.0)
HCT: 34.1 % — ABNORMAL LOW (ref 36.0–46.0)
Hemoglobin: 10.4 g/dL — ABNORMAL LOW (ref 12.0–15.0)
Lymphocytes Relative: 20.3 % (ref 12.0–46.0)
Lymphs Abs: 2.1 10*3/uL (ref 0.7–4.0)
MCHC: 30.5 g/dL (ref 30.0–36.0)
MCV: 63.8 fl — ABNORMAL LOW (ref 78.0–100.0)
Monocytes Absolute: 0.5 10*3/uL (ref 0.1–1.0)
Monocytes Relative: 5.3 % (ref 3.0–12.0)
Neutro Abs: 7.3 10*3/uL (ref 1.4–7.7)
Neutrophils Relative %: 71 % (ref 43.0–77.0)
Platelets: 449 10*3/uL — ABNORMAL HIGH (ref 150.0–400.0)
RBC: 5.35 Mil/uL — ABNORMAL HIGH (ref 3.87–5.11)
RDW: 21.9 % — ABNORMAL HIGH (ref 11.5–15.5)
WBC: 10.3 10*3/uL (ref 4.0–10.5)

## 2021-07-25 LAB — IRON,TIBC AND FERRITIN PANEL
%SAT: 4 % (calc) — ABNORMAL LOW (ref 16–45)
Ferritin: 4 ng/mL — ABNORMAL LOW (ref 16–288)
Iron: 16 ug/dL — ABNORMAL LOW (ref 45–160)
TIBC: 374 mcg/dL (calc) (ref 250–450)

## 2021-07-25 LAB — FECAL OCCULT BLOOD, IMMUNOCHEMICAL: Fecal Occult Bld: NEGATIVE

## 2021-07-25 LAB — LIPASE: Lipase: 30 U/L (ref 11.0–59.0)

## 2021-07-25 MED ORDER — IRON (FERROUS SULFATE) 325 (65 FE) MG PO TABS: 325.0000 mg | ORAL_TABLET | Freq: Every day | ORAL | 1 refills | Status: DC

## 2021-07-25 NOTE — Addendum Note (Signed)
Addended by: Anabel Halon on: 07/25/2021 05:57 PM   Modules accepted: Orders

## 2021-07-25 NOTE — Addendum Note (Signed)
Addended by: Anabel Halon on: 07/25/2021 07:59 PM   Modules accepted: Orders

## 2021-07-28 NOTE — Progress Notes (Signed)
Cardiology Office Note:   Date:  07/30/2021  NAME:  Katrina Walters    MRN: [id number] DOB:  June 19, 1956   PCP:  Mackie Pai, PA-C  Cardiologist:  Evalina Field, MD  Electrophysiologist:  None   Referring MD: Elise Benne   Chief Complaint  Patient presents with   Coronary Artery Disease        History of Present Illness:   Katrina Walters is a 64 y.o. female with a hx of CAD s/p CABG, HLD who presents for follow-up. On crestor 10 mg 3 days per week. Intolerant of repatha and praluent.  She she has been doing well since surgery.  Denies any significant chest pain or trouble breathing.  She is walking 20 to 25 minutes/day without limitations.  No lower extremity edema.  BP is 110/70.  She is on aspirin 81 mg daily.  She is on no blood pressure medications.  Her most recent LDL cholesterol is close to goal.  She cannot tolerate Praluent.  Repatha cannot be tolerated either.  We discussed pricing for Leqvio.  This will be cost prohibitive.  We discussed trying Zetia in addition to Crestor 10 mg 3 days/week.  She is willing to try this.  She may get to goal with her most recent cholesterol was close to this goal.  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  She takes colchicine for this.  Suspect drug interaction with this.  She does report episodes of rapid heartbeat sensation occur in the morning.  She reports that occurs upon awakening.  When she gets up to move around her heart can race.  EKG shows she is in sinus rhythm recently.  She has a regular pulse and rhythm on today's exam.  We discussed pursuing a heart monitor to exclude atrial fibrillation.  She is okay to do this.  She overall denies any significant symptoms in office and is doing well since surgery.  Mainstay of treatment is to get her cholesterol under control.  She did develop postoperative anemia after surgery.  Most recent hemoglobin value was 10.4.  This seems to be improving.  It was 8.5 at the  time of discharge.  I suspect some of this could be contributing to her symptoms of rapid heartbeat.  Problem List CAD -3v CAD -CABG 06/04/2021 -LIMA-LAD, SVG-OM, SVG-PDA, SVG-Diag 2. HLD -Intolerant of high intensity statin -Pruritus on repatha/praluent -Total cholesterol 132, HDL 40, LDL 76, triglycerides 77 3. Mediterranean Fever  Past Medical History: 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 28 years in [place], managed on colchicine   PONV (postoperative nausea and vomiting)     Past Surgical History: Past Surgical History:  Procedure Laterality Date   ABDOMINAL HYSTERECTOMY  08/2019   ANAL RECTAL MANOMETRY N/A 11/12/2020   Procedure: ANO RECTAL MANOMETRY;  Surgeon: Lavena Bullion, DO;  Location: [facility];  Service: Gastroenterology;  Laterality: N/A;   BLADDER SURGERY  08/2019   BREAST REDUCTION SURGERY  2006   CARDIAC CATHETERIZATION N/A 07/17/2016   Procedure: Left Heart Cath and Coronary Angiography;  Surgeon: Burnell Blanks, MD;  Location: [facility];  Service: Cardiovascular;  Laterality: N/A;   CATARACT EXTRACTION W/ INTRAOCULAR LENS IMPLANT Bilateral    COLONOSCOPY  2015   CORONARY ARTERY  BYPASS GRAFT N/A 06/04/2021   Procedure: CORONARY ARTERY BYPASS GRAFTING (CABG), ON PUMP, TIMES FOUR, USING LEFT INTERNAL MAMMARY ARTERY, LEFT RADIAL ARTERY (HARVESTED OPEN), AND RIGHT GREATER SAPHENOUS VEIN (HARVESTED ENDOSCOPICALLY);  Surgeon: Lajuana Matte, MD;  Location: [facility];  Service: Open Heart Surgery;  Laterality: N/A;   EYE SURGERY     LEFT HEART CATH AND CORONARY ANGIOGRAPHY N/A 04/22/2021   Procedure: LEFT HEART CATH AND CORONARY ANGIOGRAPHY;  Surgeon: Belva Crome, MD;  Location: [facility];  Service: Cardiovascular;  Laterality: N/A;   POLYPECTOMY  2015   RADIAL ARTERY HARVEST  Left 06/04/2021   Procedure: RADIAL ARTERY HARVEST;  Surgeon: Lajuana Matte, MD;  Location: [facility];  Service: Open Heart Surgery;  Laterality: Left;   TEE WITHOUT CARDIOVERSION N/A 06/04/2021   Procedure: TRANSESOPHAGEAL ECHOCARDIOGRAM (TEE);  Surgeon: Lajuana Matte, MD;  Location: [facility];  Service: Open Heart Surgery;  Laterality: N/A;   TONSILLECTOMY      Current Medications: Current Meds  Medication Sig   aspirin 81 MG chewable tablet Chew by mouth daily.   colchicine 0.6 MG tablet Take 1 tablet (0.6 mg total) by mouth 2 (two) times daily.   Cranberry-Vitamin C 250-60 MG CAPS Take 2 tablets by mouth daily.   D-MANNOSE PO Take 2 tablets by mouth daily.   diphenhydrAMINE (BENADRYL) 25 MG tablet Take 25 mg by mouth at bedtime.   ezetimibe (ZETIA) 10 MG tablet Take 1 tablet (10 mg total) by mouth daily.   FIBER PO Take 2 tablets by mouth daily.   Iron, Ferrous Sulfate, 325 (65 Fe) MG TABS Take 325 mg by mouth daily.   linaclotide (LINZESS) 290 MCG CAPS capsule Take 290 mcg by mouth daily before breakfast.   NON FORMULARY Take 0.5 tablets by mouth at bedtime. CBD oil - for pain Gummies   pantoprazole (PROTONIX) 40 MG tablet Take 1 tablet (40 mg total) by mouth daily.   Polyethyl Glycol-Propyl Glycol (SYSTANE OP) Place 1 drop into both eyes in the morning and at bedtime.   PREMARIN vaginal cream Place 3.01 Applicatorfuls vaginally 2 (two) times a week. (Patient taking differently: Place 0.5 g vaginally See admin instructions. Insert 0.5 grams vaginally every other night)   [DISCONTINUED] rosuvastatin (CRESTOR) 10 MG tablet Take 10 mg by mouth every other day.     Allergies:    Amoxicillin, Clopidogrel & aspirin, Amlodipine, Atorvastatin, Crestor [rosuvastatin], Metoprolol, Pravastatin, and Repatha [evolocumab]   Social History: Social History   Socioeconomic History   Marital status: Married    Spouse name: Not on file   Number of children: Not on file   Years of  education: Not on file   Highest education level: Not on file  Occupational History   Occupation: unemployed at the time  Tobacco Use   Smoking status: Never   Smokeless tobacco: Never  Vaping Use   Vaping Use: Never used  Substance and Sexual Activity   Alcohol use: Not Currently    Alcohol/week: 0.0 standard drinks    Comment: occassionally   Drug use: No    Comment: CBD gummies   Sexual activity: Not on file  Other Topics Concern   Not on file  Social History Narrative   Not on file   Social Determinants of Health   Financial Resource Strain: Not on file  Food Insecurity: Not on file  Transportation Needs: Not on file  Physical Activity: Not on file  Stress: Not on file  Social Connections:  Not on file     Family History: The patient's family history includes Cancer in her paternal aunt; Heart attack in her father and mother; Heart disease in her father and mother; Hypertension in her mother; Rectal cancer (age of onset: 95) in her father; Stomach cancer in her paternal grandmother. There is no history of Colon cancer, Colon polyps, or Esophageal cancer.  ROS:   All other ROS reviewed and negative. Pertinent positives noted in the HPI.     EKGs/Labs/Other Studies Reviewed:   The following studies were personally reviewed by me today:  Recent Labs: 06/05/2021: Magnesium 2.3 07/24/2021: ALT 10; BUN 12; Creatinine, Ser 0.71; Hemoglobin 10.4; Platelets 449.0; Potassium 4.7; Sodium 136   Recent Lipid Panel    Component Value Date/Time   CHOL 132 02/01/2021 1146   CHOL 121 06/28/2020 1150   TRIG 77.0 02/01/2021 1146   HDL 40.70 02/01/2021 1146   HDL 38 (L) 06/28/2020 1150   CHOLHDL 3 02/01/2021 1146   VLDL 15.4 02/01/2021 1146   LDLCALC 76 02/01/2021 1146   LDLCALC 62 06/28/2020 1150    Physical Exam:   VS:  BP 110/70   Pulse 92   Ht 5\' 3"  (1.6 m)   Wt 102 lb 3.2 oz (46.4 kg)   LMP  (LMP Unknown)   SpO2 99%   BMI 18.10 kg/m    Wt Readings from Last 3  Encounters:  07/30/21 102 lb 3.2 oz (46.4 kg)  07/24/21 101 lb 3.2 oz (45.9 kg)  07/19/21 102 lb (46.3 kg)    General: Well nourished, well developed, in no acute distress Head: Atraumatic, normal size  Eyes: PEERLA, EOMI  Neck: Supple, no JVD Endocrine: No thryomegaly Cardiac: Normal S1, S2; RRR; no murmurs, rubs, or gallops Lungs: Clear to auscultation bilaterally, no wheezing, rhonchi or rales  Abd: Soft, nontender, no hepatomegaly  Ext: No edema, pulses 2+ Musculoskeletal: No deformities, BUE and BLE strength normal and equal Skin: Warm and dry, no rashes   Neuro: Alert and oriented to person, place, time, and situation, CNII-XII grossly intact, no focal deficits  Psych: Normal mood and affect   ASSESSMENT:   Ragena Fiola is a 65 y.o. female who presents for the following: 1. Coronary artery disease involving coronary bypass graft of native heart without angina pectoris   2. Mixed hyperlipidemia   3. Palpitations     PLAN:   1. Coronary artery disease involving coronary bypass graft of native heart without angina pectoris 2. Mixed hyperlipidemia -Status post four-vessel CABG on 06/04/2021.  LIMA to LAD, vein graft to OM, vein graft to PDA, vein graft to diagonal. -She is doing well since surgery.  She has no restrictions currently.  She describes no symptoms of angina. -Would recommend she continue aspirin 81 mg daily.  Blood pressure is well controlled.  She has no symptoms of angina and needs no antianginal therapy. -She has been intolerant of statins in the past but cannot tolerate Crestor 10 mg 3 days/week.  She did not tolerate Repatha or Praluent due to itching at the injection site.  Marion Downer is cost prohibitive at this time. -Her most recent LDL cholesterol 76 which is close to goal.  We discussed adding Zetia 10 mg daily to see if we get her LDL less than 50.  She is willing to try this.  I believe she has no other options at this point.  She is working on her diet and I  think with the addition of Zetia she will  be at goal.  I will plan to see her back in 3 months.  She will give [place] a fasting lipid profile in roughly 2 months. -Recent echocardiogram shows normal LV function.  Overall healing well from surgery.  3. Palpitations -She does describe episodes of heart beat palpitations in the morning.  Reports rapid heartbeat upon standing.  Regular rhythm on exam today.  No concerns for A. fib.  I would like to pursue a 7-day Zio patch as postoperative atrial fibrillation.  She is willing to do this.  For now would recommend she continue with current level of activity.  -Symptoms could be related to postoperative anemia.  Her hemoglobin is improving.  We will need to keep a close eye on this.  Her primary care physician is following this.  Agree with continued iron supplement at this time  Disposition: Return in about 3 months (around 10/30/2021).  Medication Adjustments/Labs and Tests Ordered: Current medicines are reviewed at length with the patient today.  Concerns regarding medicines are outlined above.  Orders Placed This Encounter  Procedures   Lipid panel   LONG TERM MONITOR (3-14 DAYS)    Meds ordered this encounter  Medications   ezetimibe (ZETIA) 10 MG tablet    Sig: Take 1 tablet (10 mg total) by mouth daily.    Dispense:  90 tablet    Refill:  3   rosuvastatin (CRESTOR) 10 MG tablet    Sig: Take 1 tablet (10 mg total) by mouth every other day.    Dispense:  90 tablet    Refill:  1     Patient Instructions  Medication Instructions:  Start Zetia 10 mg daily  Continue Crestor 10 mg every other day.  *If you need a refill on your cardiac medications before your next appointment, please call your pharmacy*   Lab Work: LIPID in 2 months (come back fasting- nothing to eat or drink, no lab appointment needed)   If you have labs (blood work) drawn today and your tests are completely normal, you will receive your results only by: [facility] (if you have MyChart) OR A paper copy in the mail If you have any lab test that is abnormal or we need to change your treatment, we will call you to review the results.   Testing/Procedures: Bryn Gulling- Long Term Monitor Instructions  Your physician has requested you wear a ZIO patch monitor for 7 days.  This is a single patch monitor. [employer] supplies one patch monitor per enrollment. Additional stickers are not available. Please do not apply patch if you will be having a Nuclear Stress Test,  Echocardiogram, Cardiac CT, MRI, or Chest Xray during the period you would be wearing the  monitor. The patch cannot be worn during these tests. You cannot remove and re-apply the  ZIO XT patch monitor.  Your ZIO patch monitor will be mailed 3 day USPS to your address on file. It may take 3-5 days  to receive your monitor after you have been enrolled.  Once you have received your monitor, please review the enclosed instructions. Your monitor  has already been registered assigning a specific monitor serial # to you.  Billing and Patient Assistance Program Information  We have supplied [employer] with any of your insurance information on file for billing purposes. [employer] offers a sliding scale Patient Assistance Program for patients that do not have  insurance, or whose insurance does not completely cover the cost of the ZIO monitor.  You must  apply for the Patient Assistance Program to qualify for this discounted rate.  To apply, please call [employer] at [phone], select option 4, select option 2, ask to apply for  Patient Assistance Program. Theodore Demark will ask your household income, and how many people  are in your household. They will quote your out-of-pocket cost based on that information.  [employer] will also be able to set up a 44-month, interest-free payment plan if needed.  Applying the monitor   Shave hair from upper left chest.  Hold abrader disc by orange tab. Rub abrader in 40  strokes over the upper left chest as  indicated in your monitor instructions.  Clean area with 4 enclosed alcohol pads. Let dry.  Apply patch as indicated in monitor instructions. Patch will be placed under collarbone on left  side of chest with arrow pointing upward.  Rub patch adhesive wings for 2 minutes. Remove white label marked "1". Remove the white  label marked "2". Rub patch adhesive wings for 2 additional minutes.  While looking in a mirror, press and release button in center of patch. A small green light will  flash 3-4 times. This will be your only indicator that the monitor has been turned on.  Do not shower for the first 24 hours. You may shower after the first 24 hours.  Press the button if you feel a symptom. You will hear a small click. Record Date, Time and  Symptom in the Patient Logbook.  When you are ready to remove the patch, follow instructions on the last 2 pages of Patient  Logbook. Stick patch monitor onto the last page of Patient Logbook.  Place Patient Logbook in the blue and white box. Use locking tab on box and tape box closed  securely. The blue and white box has prepaid postage on it. Please place it in the mailbox as  soon as possible. Your physician should have your test results approximately 7 days after the  monitor has been mailed back to [facility].  Call [facility] at [phone] if you have questions regarding  your ZIO XT patch monitor. Call them immediately if you see an orange light blinking on your  monitor.  If your monitor falls off in less than 4 days, contact our Monitor department at [phone].  If your monitor becomes loose or falls off after 4 days call [employer] at [phone] for  suggestions on securing your monitor    Follow-Up: At [facility], you and your health needs are our priority.  As part of our continuing mission to provide you with exceptional heart care, we have created designated Provider Care  Teams.  These Care Teams include your primary Cardiologist (physician) and Advanced Practice Providers (APPs -  Physician Assistants and Nurse Practitioners) who all work together to provide you with the care you need, when you need it.  We recommend signing up for the patient portal called "MyChart".  Sign up information is provided on this After Visit Summary.  MyChart is used to connect with patients for Virtual Visits (Telemedicine).  Patients are able to view lab/test results, encounter notes, upcoming appointments, etc.  Non-urgent messages can be sent to your provider as well.   To learn more about what you can do with MyChart, go to [email or web address].    Your next appointment:   Keep appointment as scheduled.   Time Spent with Patient: I have spent a total of 35 minutes with patient reviewing hospital notes, telemetry, EKGs, labs and examining  the patient as well as establishing an assessment and plan that was discussed with the patient.  > 50% of time was spent in direct patient care.  Signed, Addison Naegeli. Audie Box, MD, [facility]  [address], [place], [place] [address] [phone]  07/30/2021 2:20 PM

## 2021-07-30 ENCOUNTER — Other Ambulatory Visit: Payer: Self-pay

## 2021-07-30 ENCOUNTER — Encounter: Payer: Self-pay | Admitting: Cardiovascular Disease

## 2021-07-30 ENCOUNTER — Ambulatory Visit: Payer: Medicare Other | Admitting: Cardiovascular Disease

## 2021-07-30 ENCOUNTER — Ambulatory Visit (INDEPENDENT_AMBULATORY_CARE_PROVIDER_SITE_OTHER): Payer: Medicare Other

## 2021-07-30 VITALS — BP 110/70 | HR 92 | Ht 63.0 in | Wt 102.2 lb

## 2021-07-30 DIAGNOSIS — I25810 Atherosclerosis of coronary artery bypass graft(s) without angina pectoris: Secondary | ICD-10-CM | POA: Diagnosis not present

## 2021-07-30 DIAGNOSIS — R002 Palpitations: Secondary | ICD-10-CM

## 2021-07-30 DIAGNOSIS — E782 Mixed hyperlipidemia: Secondary | ICD-10-CM | POA: Diagnosis not present

## 2021-07-30 MED ORDER — EZETIMIBE 10 MG PO TABS: 10.0000 mg | ORAL_TABLET | Freq: Every day | ORAL | 3 refills | Status: DC

## 2021-07-30 MED ORDER — ROSUVASTATIN CALCIUM 10 MG PO TABS: 10.0000 mg | ORAL_TABLET | ORAL | 1 refills | Status: DC

## 2021-07-30 NOTE — Progress Notes (Unsigned)
Enrolled patient for a 7 day Zio XT monitor to be mailed to patients home.  

## 2021-07-30 NOTE — Patient Instructions (Signed)
Medication Instructions:  Start Zetia 10 mg daily  Continue Crestor 10 mg every other day.  *If you need a refill on your cardiac medications before your next appointment, please call your pharmacy*   Lab Work: LIPID in 2 months (come back fasting- nothing to eat or drink, no lab appointment needed)   If you have labs (blood work) drawn today and your tests are completely normal, you will receive your results only by: [facility] (if you have MyChart) OR A paper copy in the mail If you have any lab test that is abnormal or we need to change your treatment, we will call you to review the results.   Testing/Procedures: Bryn Gulling- Long Term Monitor Instructions  Your physician has requested you wear a ZIO patch monitor for 7 days.  This is a single patch monitor. [employer] supplies one patch monitor per enrollment. Additional stickers are not available. Please do not apply patch if you will be having a Nuclear Stress Test,  Echocardiogram, Cardiac CT, MRI, or Chest Xray during the period you would be wearing the  monitor. The patch cannot be worn during these tests. You cannot remove and re-apply the  ZIO XT patch monitor.  Your ZIO patch monitor will be mailed 3 day USPS to your address on file. It may take 3-5 days  to receive your monitor after you have been enrolled.  Once you have received your monitor, please review the enclosed instructions. Your monitor  has already been registered assigning a specific monitor serial # to you.  Billing and Patient Assistance Program Information  We have supplied [employer] with any of your insurance information on file for billing purposes. [employer] offers a sliding scale Patient Assistance Program for patients that do not have  insurance, or whose insurance does not completely cover the cost of the ZIO monitor.  You must apply for the Patient Assistance Program to qualify for this discounted rate.  To apply, please call [employer] at [phone],  select option 4, select option 2, ask to apply for  Patient Assistance Program. Theodore Demark will ask your household income, and how many people  are in your household. They will quote your out-of-pocket cost based on that information.  [employer] will also be able to set up a 29-month, interest-free payment plan if needed.  Applying the monitor   Shave hair from upper left chest.  Hold abrader disc by orange tab. Rub abrader in 40 strokes over the upper left chest as  indicated in your monitor instructions.  Clean area with 4 enclosed alcohol pads. Let dry.  Apply patch as indicated in monitor instructions. Patch will be placed under collarbone on left  side of chest with arrow pointing upward.  Rub patch adhesive wings for 2 minutes. Remove white label marked "1". Remove the white  label marked "2". Rub patch adhesive wings for 2 additional minutes.  While looking in a mirror, press and release button in center of patch. A small green light will  flash 3-4 times. This will be your only indicator that the monitor has been turned on.  Do not shower for the first 24 hours. You may shower after the first 24 hours.  Press the button if you feel a symptom. You will hear a small click. Record Date, Time and  Symptom in the Patient Logbook.  When you are ready to remove the patch, follow instructions on the last 2 pages of Patient  Logbook. Stick patch monitor onto the last page of  Patient Logbook.  Place Patient Logbook in the blue and white box. Use locking tab on box and tape box closed  securely. The blue and white box has prepaid postage on it. Please place it in the mailbox as  soon as possible. Your physician should have your test results approximately 7 days after the  monitor has been mailed back to [facility].  Call [place] at [phone] if you have questions regarding  your ZIO XT patch monitor. Call them immediately if you see an orange light blinking on your   monitor.  If your monitor falls off in less than 4 days, contact our Monitor department at [phone].  If your monitor becomes loose or falls off after 4 days call [employer] at [phone] for  suggestions on securing your monitor    Follow-Up: At [facility], you and your health needs are our priority.  As part of our continuing mission to provide you with exceptional heart care, we have created designated Provider Care Teams.  These Care Teams include your primary Cardiologist (physician) and Advanced Practice Providers (APPs -  Physician Assistants and Nurse Practitioners) who all work together to provide you with the care you need, when you need it.  We recommend signing up for the patient portal called "MyChart".  Sign up information is provided on this After Visit Summary.  MyChart is used to connect with patients for Virtual Visits (Telemedicine).  Patients are able to view lab/test results, encounter notes, upcoming appointments, etc.  Non-urgent messages can be sent to your provider as well.   To learn more about what you can do with MyChart, go to [email or web address].    Your next appointment:   Keep appointment as scheduled.

## 2021-08-01 ENCOUNTER — Encounter: Payer: Self-pay | Admitting: Medical

## 2021-08-02 DIAGNOSIS — R002 Palpitations: Secondary | ICD-10-CM | POA: Diagnosis not present

## 2021-08-05 ENCOUNTER — Ambulatory Visit: Payer: Medicare Other | Admitting: Medical

## 2021-08-05 NOTE — Telephone Encounter (Signed)
Please advise 

## 2021-08-05 NOTE — Telephone Encounter (Signed)
Call Type Triage / Clinical Relationship To Patient Self Return Phone Number [phone] (Primary) Chief Complaint Abdominal Pain Reason for Call Symptomatic / Request for Health Information Initial Comment The caller states that she has been experiencing UTI symptoms, which include burning during urination, frequent urination, and abdominal pain/pressure. The caller states that she had some antibiotics at home and started on a course of Sulfamethoxazole; however, she only had enough for 2 days and she is now out. The caller urgently needs the remaining 3 days worth of medication to please be called in so the symptoms don't come back. Additional Comment Preferred Pharmacy: [facility] (Phone #: [phone]). Translation No Nurse Assessment Nurse: Toy Cookey, RN, Stanton Kidney Date/Time Eilene Ghazi Time): 08/02/2021 10:07:45 AM Confirm and document reason for call. If symptomatic, describe symptoms. ---The caller states that she has been experiencing UTI symptoms, which include burning during urination, frequent urination, and abdominal pain/pressure. The caller states that she had some antibiotics at home and started on a course of Sulfamethoxazole; however, she only had enough for 2 days and she is now out. Sx have improved some but she is still having some.

## 2021-08-05 NOTE — Telephone Encounter (Signed)
Looks like patient had an appt for today but cancelled.

## 2021-08-12 ENCOUNTER — Encounter (HOSPITAL_COMMUNITY): Payer: Self-pay

## 2021-08-12 ENCOUNTER — Telehealth (HOSPITAL_COMMUNITY): Payer: Self-pay

## 2021-08-12 NOTE — Telephone Encounter (Signed)
Attempted to call patient in regards to Cardiac Rehab - LM on VM Mailed letter 

## 2021-08-13 ENCOUNTER — Ambulatory Visit: Payer: Medicare Other | Admitting: Medical

## 2021-09-03 ENCOUNTER — Other Ambulatory Visit: Payer: Self-pay

## 2021-09-03 ENCOUNTER — Ambulatory Visit (INDEPENDENT_AMBULATORY_CARE_PROVIDER_SITE_OTHER): Payer: Medicare Other | Admitting: Gastroenterology

## 2021-09-03 ENCOUNTER — Encounter: Payer: Self-pay | Admitting: Gastroenterology

## 2021-09-03 VITALS — BP 112/70 | HR 85 | Ht 63.0 in | Wt 101.2 lb

## 2021-09-03 DIAGNOSIS — K648 Other hemorrhoids: Secondary | ICD-10-CM

## 2021-09-03 DIAGNOSIS — M6289 Other specified disorders of muscle: Secondary | ICD-10-CM

## 2021-09-03 DIAGNOSIS — K5900 Constipation, unspecified: Secondary | ICD-10-CM

## 2021-09-03 DIAGNOSIS — K921 Melena: Secondary | ICD-10-CM

## 2021-09-03 DIAGNOSIS — Z86010 Personal history of colonic polyps: Secondary | ICD-10-CM

## 2021-09-03 DIAGNOSIS — D509 Iron deficiency anemia, unspecified: Secondary | ICD-10-CM | POA: Diagnosis not present

## 2021-09-03 DIAGNOSIS — K5902 Outlet dysfunction constipation: Secondary | ICD-10-CM

## 2021-09-03 MED ORDER — LINACLOTIDE 290 MCG PO CAPS: 290.0000 ug | ORAL_CAPSULE | Freq: Every day | ORAL | 5 refills | Status: DC

## 2021-09-03 NOTE — Patient Instructions (Addendum)
If you are age 64 or older, your body mass index should be between 23-30. Your Body mass index is 17.94 kg/m. If this is out of the aforementioned range listed, please consider follow up with your Primary Care Provider.  If you are age 51 or younger, your body mass index should be between 19-25. Your Body mass index is 17.94 kg/m. If this is out of the aformentioned range listed, please consider follow up with your Primary Care Provider.   ________________________________________________________  The [facility] providers would like to encourage you to use [facility] to communicate with providers for non-urgent requests or questions.  Due to long hold times on the telephone, sending your provider a message by [facility] may be a faster and more efficient way to get a response.  Please allow 48 business hours for a response.  Please remember that this is for non-urgent requests.  _______________________________________________________  A referral has been placed for IV iron infusions at the cancer center [address]. Please call them in 2 weeks if you haven't heard anything. And call us with any questions.  We have sent the following medications to your pharmacy for you to pick up at your convenience: Linzess 29mcg to take daily  It was a pleasure to see you today!  Vito Cirigliano, D.O.

## 2021-09-03 NOTE — Progress Notes (Signed)
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  Previously trialed Dulcolax qod and increased hydration without much response, MiraLAX. - 02/2020: Abdominal x-ray with abundant stool burden.  Trialed Amitiza in 02/2020 with good efficacy but stopped due to upset stomach. - 03/2020: Started on [place] with good clinical response, but reduced efficacy in early 2022.  (Interestingly, constipation started after hysterectomy in 2020.  Prior baseline was BM Q3d without straining) -10/2020: Sitz marker study: 10 markers remain with 9 in the transverse colon indicating slow colonic transit.  Started Trulance -11/2020: ARM: Decreased resting pressure with normal squeeze.  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  Referred to PT - 11/2020: Trulance was suboptimally efficacious.  Stopped and went back to Linzess 290 mcg/20   History of internal hemorrhoids with intermittent scant BRBPR, typically responsive to OTC Preparation H.   Family history notable for father with colon cancer in his 61s.   Endoscopic history: -Colonoscopy (08/17/2014, Dr. Charisse Klinefelter at [facility]): Single small polypoid polyp at splenic flexure removed with snare (path: Unknown), Internal hemorrhoids. -Colonoscopy (03/2020, Dr. Bryan Lemma): 2 rectal polyps (1 tubular adenoma), tortuous sigmoid colon, lipoma in the ascending colon, internal hemorrhoids.  Repeat in 7 years  HPI:     Patient is a 65 y.o. female presenting to the Gastroenterology Clinic for follow-up.  Last seen by me in the hospital on 10/11/2020.  Wrap ongoing constipation will provide continued Linzess, adequate hydration and OTC bisacodyl 5 mg TID.  Rectal  exam with moderately reduced rectal squeeze pressure but otherwise preserved/appropriate function.  Was referred for ARM and sits marker study.  Trialed Trulance with suboptimal response.  Added MiraLAX 1 cap/day with titration.  Trulance stopped and changed back to Linzess 290 mcg/day. Was doing well on Linzess and after Pelvic floor PT.   Underwent 4V CABG 05/2021.  Did have exacerbation of constipation postoperatively with associated scant BRB on tissue paper.  Constipation now improving.  No associated dyschezia, abdominal pain.   Sxs improving. On Linzess 290 mcg/day and fiber supplement and restarted PT exercises.  Reviewed labs from 07/24/2021.  H/H 10.4/34.1 (improved from postop in 05/2021) with MCV 63.8 and RDW 21.9.  Ferritin 4, iron 16, TIBC 324, sat 4% (no previous iron studies for review).  Was recommended to start on iron tablets by PCM, but she hasn't started for fear of GI side effects.  Otherwise normal CMP, lipase.  FOBT negative.  Review of systems:     No chest pain, no SOB, no fevers, no urinary sx   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 55 years in [place], managed on colchicine   PONV (postoperative nausea and vomiting)     Patient's surgical history, family medical history, social history, medications and allergies were all reviewed in Epic    Current Outpatient Medications  Medication Sig Dispense Refill   aspirin 81 MG chewable tablet Chew by mouth daily.     colchicine 0.6 MG tablet Take 1 tablet (0.6 mg total) by mouth 2 (two) times daily. 180 tablet 3  Cranberry-Vitamin C 250-60 MG CAPS Take 2 tablets by mouth daily.     D-MANNOSE PO Take 2 tablets by mouth daily.     diphenhydrAMINE (BENADRYL) 25 MG tablet Take 25 mg by mouth at bedtime.     ezetimibe (ZETIA) 10 MG tablet Take 1 tablet (10 mg total) by  mouth daily. 90 tablet 3   FIBER PO Take 2 tablets by mouth daily.     Iron, Ferrous Sulfate, 325 (65 Fe) MG TABS Take 325 mg by mouth daily. 60 tablet 1   linaclotide (LINZESS) 290 MCG CAPS capsule Take 290 mcg by mouth daily before breakfast.     NON FORMULARY Take 0.5 tablets by mouth at bedtime. CBD oil - for pain Gummies     pantoprazole (PROTONIX) 40 MG tablet Take 1 tablet (40 mg total) by mouth daily. 30 tablet 3   Polyethyl Glycol-Propyl Glycol (SYSTANE OP) Place 1 drop into both eyes in the morning and at bedtime.     PREMARIN vaginal cream Place 9.76 Applicatorfuls vaginally 2 (two) times a week. (Patient taking differently: Place 0.5 g vaginally See admin instructions. Insert 0.5 grams vaginally every other night) 30 g 0   rosuvastatin (CRESTOR) 10 MG tablet Take 1 tablet (10 mg total) by mouth every other day. 90 tablet 1   Alirocumab (PRALUENT) 75 MG/ML SOAJ Inject 75 mg into the skin every 14 (fourteen) days. (Patient not taking: Reported on 07/30/2021) 2 mL 11   No current facility-administered medications for this visit.    Physical Exam:     BP 112/70    Pulse 85    Ht 5\' 3"  (1.6 m)    Wt 101 lb 4 oz (45.9 kg)    LMP  (LMP Unknown)    SpO2 99%    BMI 17.94 kg/m   GENERAL:  Pleasant female in NAD PSYCH: : Cooperative, normal affect Musculoskeletal:  Normal muscle tone, normal strength NEURO: Alert and oriented x 3, no focal neurologic deficits   IMPRESSION and PLAN:    1) Chronic constipation 2) Pelvic floor dyssynergia - Constipation was overall well controlled with Linzess 290 mcg/day and following pelvic floor PT.  Recent exacerbation in the postoperative setting, but overall symptoms are improving - Continue Linzess.  Refill placed today - Continue pelvic floor exercises as outlined by PT - Continue adequate hydration - Continue fiber supplement  3) Hematochezia 4) Internal hemorrhoids - Scant BRB on tissue paper in the setting of exacerbation of  constipation and known internal hemorrhoids.  Based on description, do not feel this is contributing to her anemia - Topical hemorrhoid cream.  Do not use beyond 14 days at a time - If hemorrhoidal symptoms persist despite improvement/resolution of constipation, plan for hemorrhoid band ligation  5) Iron deficiency anemia - Developed anemia during recent hospitalization for CABG.  H/H improved since hospital discharge but with significant iron deficiency.  No previous iron studies for comparison - Patient has significant fear about starting oral iron due to GI side effect profile and exacerbating her underlying constipation, which is all quite reasonable - Referral to Hematology for IV iron - Colonoscopy up-to-date.  No previous EGD, but since this anemia all developed in the postoperative setting (Hgb was 13.7 on 05/31/2021 preoperatively), do not feel that additional endoscopic evaluation needed at this juncture  6) History of colon polyps - Repeat colonoscopy in 2028 for ongoing surveillance          Lavena Bullion ,DO, FACG 09/03/2021, 10:59 AM

## 2021-09-06 ENCOUNTER — Telehealth: Payer: Self-pay | Admitting: Medical

## 2021-09-06 ENCOUNTER — Telehealth: Payer: Self-pay | Admitting: Cardiovascular Disease

## 2021-09-06 ENCOUNTER — Telehealth (INDEPENDENT_AMBULATORY_CARE_PROVIDER_SITE_OTHER): Payer: Medicare Other | Admitting: Medical

## 2021-09-06 ENCOUNTER — Encounter: Payer: Self-pay | Admitting: Medical

## 2021-09-06 ENCOUNTER — Inpatient Hospital Stay: Payer: Medicare Other | Admitting: Family

## 2021-09-06 ENCOUNTER — Inpatient Hospital Stay: Payer: Medicare Other

## 2021-09-06 DIAGNOSIS — R300 Dysuria: Secondary | ICD-10-CM

## 2021-09-06 DIAGNOSIS — U071 COVID-19: Secondary | ICD-10-CM | POA: Diagnosis not present

## 2021-09-06 MED ORDER — BENZONATATE 100 MG PO CAPS: 100.0000 mg | ORAL_CAPSULE | Freq: Three times a day (TID) | ORAL | 0 refills | Status: DC | PRN

## 2021-09-06 MED ORDER — SULFAMETHOXAZOLE-TRIMETHOPRIM 800-160 MG PO TABS: 1.0000 | ORAL_TABLET | Freq: Two times a day (BID) | ORAL | 0 refills | Status: DC

## 2021-09-06 MED ORDER — LEVOFLOXACIN 500 MG PO TABS: ORAL_TABLET | ORAL | 0 refills | Status: DC

## 2021-09-06 MED ORDER — MOLNUPIRAVIR EUA 200MG CAPSULE: 4.0000 | ORAL_CAPSULE | Freq: Two times a day (BID) | ORAL | 0 refills | Status: AC

## 2021-09-06 MED ORDER — FLUTICASONE PROPIONATE 50 MCG/ACT NA SUSP: 2.0000 | Freq: Every day | NASAL | 1 refills | Status: DC

## 2021-09-06 NOTE — Telephone Encounter (Signed)
pt stated meds (sulfamethoxazole-trimethoprim) are making her sick to her stomach and she she would like an alternate med. please advise.

## 2021-09-06 NOTE — Telephone Encounter (Signed)
Pt aware and voices understanding.   

## 2021-09-06 NOTE — Telephone Encounter (Signed)
Patient calling to say that she has covid. Wanted to see if dr Audie Box thinks she should do/take anything in particular. Since she just had heart surgery couple of months ago. Please advise

## 2021-09-06 NOTE — Patient Instructions (Addendum)
Covid infection in patient with risk score of 3.  Symptoms for 1 day.  Presently mild to moderate symptoms.  Vaccinated x2.  We will go ahead and prescribe molnupiravir.  Rx advisement given and explained emergency use authorization.  Prescribing Flonase for nasal congestion and making benzonatate available for cough though no cough presently.  I think likely may get cough over the weekend.  Presently dysuria or couple of days.  I think best not to have her in the office to give urine sample in light of COVID infection.  Patient recently approximate 1 month ago use Bactrim with no severe side effects other than mild upset stomach.  Other medication such as penicillin she has allergic reaction to.  Therefore did not want to prescribe cephalosporin.  Consider Macrobid but brought up allergic reaction as well.  Think Bactrim is safest to use.  I advised that if this is upsetting her stomach excessively then only take 1/2 tablet twice daily.  Advised to get O2 sat monitor over-the-counter check O2 sats daily.  Want to see levels above 96%.  Follow-up in 7 days or sooner if needed.  Pt could not tolerate bacrim. So sent in 3 days of levaquin. On review she has been given this by iv and on allergy list.

## 2021-09-06 NOTE — Telephone Encounter (Signed)
Patient said she just tested positive for COVID this morning. She said her Husband had it a couple days ago but she is not sure what to do. She would like DR. O'Neal's advice.  Please advise

## 2021-09-06 NOTE — Telephone Encounter (Signed)
Pt was seen today- please advise.

## 2021-09-06 NOTE — Telephone Encounter (Signed)
Patient states she felt bad this morning , she self -tested and was positive for Covid. She states her husband tested positive 2 to 3 days ago.  Patient would like for Dr     Jenetta DownerNori Riis.  RN reviewed wit Dr Jenetta DownerNori Riis . Per Dr Jenetta Downer' Neal,patient recently had surgery CABG 07/2021.  He recommends if patient is a candidate for  infusion/medication  for Covid to do so and for the patient to contact primary.   RN  informed patient of Dr Jenetta Downer' Nori Riis response . She verbalized understanding.

## 2021-09-06 NOTE — Progress Notes (Signed)
Subjective:    Patient ID: Katrina Walters, female    DOB: [birth year], 65 y.o.   MRN: [id number]  HPI  Virtual Visit via Video Note  I connected with [name] on 09/06/21 at  8:40 AM EST by a video enabled telemedicine application and verified that I am speaking with the correct person using two identifiers.  Location: Patient: home Provider: office   I discussed the limitations of evaluation and management by telemedicine and the availability of in person appointments. The patient expressed understanding and agreed to proceed.  History of Present Illness: Pt recently diagnosed with covid today.  Test used- otc tody.  History of covid vaccine x 2.  Covid risk score- 3  History of covid infection-no  Current symptoms-1 day nasal congestion, low level fever, mild fatigue and bodyaches.  Pt 02 sat-no.    Pt thinks has uti. Slight burning on urination. She used 3 days of bactrim which were left over from uti in November.   Observations/Objective: General-no acute distress, pleasant, oriented. Lungs- on inspection lungs appear unlabored. Neck- no tracheal deviation or jvd on inspection. Neuro- gross motor function appears intact.     Assessment and Plan:  Patient Instructions  Covid infection in patient with risk score of 3.  Symptoms for 1 day.  Presently mild to moderate symptoms.  Vaccinated x2.  We will go ahead and prescribe molnupiravir.  Rx advisement given and explained emergency use authorization.  Prescribing Flonase for nasal congestion and making benzonatate available for cough though no cough presently.  I think likely may get cough over the weekend.  Presently dysuria or couple of days.  I think best not to have her in the office to give urine sample in light of COVID infection.  Patient recently approximate 1 month ago use Bactrim with no severe side effects other than mild upset stomach.  Other medication such as penicillin she has allergic reaction to.   Therefore did not want to prescribe cephalosporin.  Consider Macrobid but brought up allergic reaction as well.  Think Bactrim is safest to use.  I advised that if this is upsetting her stomach excessively then only take 1/2 tablet twice daily.  Advised to get O2 sat monitor over-the-counter check O2 sats daily.  Want to see levels above 96%.  Follow-up in 7 days or sooner if needed.   Mackie Pai, PA-C    Time spent with patient today was 30  minutes which consisted of chart revdiew, discussing diagnosis, work up treatment and documentation.  Follow Up Instructions:    I discussed the assessment and treatment plan with the patient. The patient was provided an opportunity to ask questions and all were answered. The patient agreed with the plan and demonstrated an understanding of the instructions.   The patient was advised to call back or seek an in-person evaluation if the symptoms worsen or if the condition fails to improve as anticipated.     Mackie Pai, PA-C    Review of Systems  Constitutional:  Positive for fatigue and fever. Negative for chills.  HENT:  Positive for congestion.   Respiratory:  Negative for cough, shortness of breath and wheezing.   Cardiovascular:  Negative for chest pain and palpitations.  Gastrointestinal:  Negative for abdominal pain.  Genitourinary:  Positive for dysuria.  Musculoskeletal:  Positive for myalgias.  Skin:  Negative for rash.  Neurological:  Negative for dizziness and numbness.  Psychiatric/Behavioral:  Negative for behavioral problems and confusion.  Objective:   Physical Exam        Assessment & Plan:

## 2021-09-06 NOTE — Telephone Encounter (Signed)
See other  telephone note - 09/06/21  at  8:05am

## 2021-09-06 NOTE — Addendum Note (Signed)
Addended by: Anabel Halon on: 09/06/2021 11:13 AM   Modules accepted: Orders

## 2021-09-10 ENCOUNTER — Telehealth: Payer: Self-pay

## 2021-09-10 NOTE — Telephone Encounter (Signed)
Prolia VOB initiated via [email or web address]  Last OV:  Next OV:  Last Prolia inj: 03/15/21 Next Prolia inj DUE: 09/16/21

## 2021-09-12 ENCOUNTER — Encounter: Payer: Self-pay | Admitting: Medical

## 2021-09-13 ENCOUNTER — Ambulatory Visit: Payer: Medicare Other | Admitting: Medical

## 2021-09-14 NOTE — Telephone Encounter (Signed)
PA required for Prolia  PA PROCESS DETAILS: Please complete the prior authorization form located at [email or web address] or call [phone].

## 2021-09-17 LAB — LIPID PANEL
Chol/HDL Ratio: 3.5 ratio (ref 0.0–4.4)
Cholesterol, Total: 120 mg/dL (ref 100–199)
HDL: 34 mg/dL — ABNORMAL LOW (ref >39)
LDL Chol Calc (NIH): 64 mg/dL (ref 0–99)
Triglycerides: 118 mg/dL (ref 0–149)
VLDL Cholesterol Cal: 22 mg/dL (ref 5–40)

## 2021-09-18 ENCOUNTER — Ambulatory Visit (INDEPENDENT_AMBULATORY_CARE_PROVIDER_SITE_OTHER): Payer: Medicare Other

## 2021-09-18 DIAGNOSIS — M810 Age-related osteoporosis without current pathological fracture: Secondary | ICD-10-CM

## 2021-09-18 MED ORDER — DENOSUMAB 60 MG/ML ~~LOC~~ SOSY
60.0000 mg | PREFILLED_SYRINGE | Freq: Once | SUBCUTANEOUS | Status: AC
  Administered 2021-09-18: 60 mg via SUBCUTANEOUS

## 2021-09-18 NOTE — Progress Notes (Addendum)
Katrina Walters is a 66 y.o. female presents to the office today for prolia injection per physician's orders. This second dose of denosuman (med), 60mg /94mL (dose),  SQ (route) was administered right arm (location) today. Patient tolerated injection. Patient due for follow up labs/provider appt: No. Date due: 09/26/21, will need bmp Patient next injection due: 6 months and one day (03/19/22) appt made No Patient advised to call for scheduling prior to coming in.   Mackie Pai, PA-C

## 2021-09-20 ENCOUNTER — Encounter: Payer: Self-pay | Admitting: Cardiovascular Disease

## 2021-09-20 ENCOUNTER — Ambulatory Visit: Payer: Medicare Other | Admitting: Medical

## 2021-09-25 ENCOUNTER — Other Ambulatory Visit: Payer: Self-pay | Admitting: Family

## 2021-09-25 DIAGNOSIS — D649 Anemia, unspecified: Secondary | ICD-10-CM

## 2021-09-26 ENCOUNTER — Encounter: Payer: Self-pay | Admitting: Family

## 2021-09-26 ENCOUNTER — Inpatient Hospital Stay: Payer: Medicare Other | Admitting: Family

## 2021-09-26 ENCOUNTER — Inpatient Hospital Stay: Payer: Medicare Other | Attending: Hematology & Oncology

## 2021-09-26 ENCOUNTER — Other Ambulatory Visit: Payer: Self-pay

## 2021-09-26 VITALS — BP 130/73 | HR 85 | Temp 98.1°F | Resp 18 | Ht 63.0 in | Wt 103.8 lb

## 2021-09-26 DIAGNOSIS — K648 Other hemorrhoids: Secondary | ICD-10-CM | POA: Diagnosis present

## 2021-09-26 DIAGNOSIS — D500 Iron deficiency anemia secondary to blood loss (chronic): Secondary | ICD-10-CM | POA: Insufficient documentation

## 2021-09-26 DIAGNOSIS — D649 Anemia, unspecified: Secondary | ICD-10-CM

## 2021-09-26 DIAGNOSIS — K922 Gastrointestinal hemorrhage, unspecified: Secondary | ICD-10-CM

## 2021-09-26 LAB — CMP (CANCER CENTER ONLY)
ALT: 10 U/L (ref 0–44)
AST: 15 U/L (ref 15–41)
Albumin: 4.3 g/dL (ref 3.5–5.0)
Alkaline Phosphatase: 55 U/L (ref 38–126)
Anion gap: 8 (ref 5–15)
BUN: 13 mg/dL (ref 8–23)
CO2: 26 mmol/L (ref 22–32)
Calcium: 9.6 mg/dL (ref 8.9–10.3)
Chloride: 104 mmol/L (ref 98–111)
Creatinine: 0.79 mg/dL (ref 0.44–1.00)
GFR, Estimated: >60 mL/min (ref >60)
Glucose, Bld: 105 mg/dL — ABNORMAL HIGH (ref 70–99)
Potassium: 4.4 mmol/L (ref 3.5–5.1)
Sodium: 138 mmol/L (ref 135–145)
Total Bilirubin: 0.6 mg/dL (ref 0.3–1.2)
Total Protein: 7.3 g/dL (ref 6.5–8.1)

## 2021-09-26 LAB — CBC WITH DIFFERENTIAL (CANCER CENTER ONLY)
Abs Immature Granulocytes: 0.06 10*3/uL (ref 0.00–0.07)
Basophils Absolute: 0.1 10*3/uL (ref 0.0–0.1)
Basophils Relative: 1 %
Eosinophils Absolute: 0.3 10*3/uL (ref 0.0–0.5)
Eosinophils Relative: 3 %
HCT: 36.4 % (ref 36.0–46.0)
Hemoglobin: 9.9 g/dL — ABNORMAL LOW (ref 12.0–15.0)
Immature Granulocytes: 1 %
Lymphocytes Relative: 20 %
Lymphs Abs: 2 10*3/uL (ref 0.7–4.0)
MCH: 17.9 pg — ABNORMAL LOW (ref 26.0–34.0)
MCHC: 27.2 g/dL — ABNORMAL LOW (ref 30.0–36.0)
MCV: 65.8 fL — ABNORMAL LOW (ref 80.0–100.0)
Monocytes Absolute: 0.7 10*3/uL (ref 0.1–1.0)
Monocytes Relative: 7 %
Neutro Abs: 6.8 10*3/uL (ref 1.7–7.7)
Neutrophils Relative %: 68 %
Platelet Count: 461 10*3/uL — ABNORMAL HIGH (ref 150–400)
RBC: 5.53 MIL/uL — ABNORMAL HIGH (ref 3.87–5.11)
RDW: 24.7 % — ABNORMAL HIGH (ref 11.5–15.5)
Smear Review: NORMAL
WBC Count: 9.8 10*3/uL (ref 4.0–10.5)
nRBC: 0 % (ref 0.0–0.2)

## 2021-09-26 LAB — RETICULOCYTES
Immature Retic Fract: 22.6 % — ABNORMAL HIGH (ref 2.3–15.9)
RBC.: 5.43 MIL/uL — ABNORMAL HIGH (ref 3.87–5.11)
Retic Count, Absolute: 101 K/uL (ref 19.0–186.0)
Retic Ct Pct: 1.9 % (ref 0.4–3.1)

## 2021-09-26 LAB — IRON AND IRON BINDING CAPACITY (CC-WL,HP ONLY)
Iron: 26 ug/dL — ABNORMAL LOW (ref 28–170)
Saturation Ratios: 6 % — ABNORMAL LOW (ref 10.4–31.8)
TIBC: 442 ug/dL (ref 250–450)
UIBC: 416 ug/dL (ref 148–442)

## 2021-09-26 LAB — SAVE SMEAR(SSMR), FOR PROVIDER SLIDE REVIEW

## 2021-09-26 LAB — LACTATE DEHYDROGENASE: LDH: 155 U/L (ref 98–192)

## 2021-09-26 NOTE — Progress Notes (Signed)
Hematology/Oncology Consultation   Name: Katrina Walters      MRN: [id number]    Location: Room/bed info not found  Date: 09/26/2021 Time:10:29 AM   REFERRING PHYSICIAN: Gerrit Heck, MD  REASON FOR CONSULT: Iron deficiency anemia, internal hemorrhoids    DIAGNOSIS: Iron deficiency anemia secondary to intermittent GI blood loss with internal hemorrhoids  HISTORY OF PRESENT ILLNESS: Ms. Payes is a very pleasant 66 yo caucasian female with iron deficiency anemia. This is felt to be in part due to being CABG x 4 (September 2022) and internal hemorrhoids with occasional blood loss.  She has never required a blood transfusion or IV iron in the past.  She takes 1 baby aspirin daily. No anticoagulation use. She has not noted any blood loss. No petechiae. She does bruise easily on her arms. Stool test in November was negative for blood.  She is symptomatic with fatigue, weakness, insomnia, SOB with exertion and palpitations.  No known family history of anemia.  She is taking CBD gummies as well as benadryl to help her sleep.  No fever, chills, n/v, cough, rash, dizziness, SOB, chest pain, palpitations, abdominal pain or changes in bowel or bladder habits.  She is currently on Linzess for constipation and is increasing her fiber intake.   She notes itching after taking a shower. She uses a medicated body cream which seems to have helped. She has 1 son and history of 2 miscarriages within the first 5 weeks.  No history of diabetes or thyroid disease.  No personal history of cancer. Family history includes: father - prostate, paternal aunt - breast and paternal grandmother - stomach.  She had her colonoscopy and states that she has history of benign polyps. She is due again in 2028.  She is due now for mammogram and PAP smear and has been scheduled for 10/23/2021. She also plans to discuss issues with UTI occurring after intercourse.  She states that she has had multiple surgeries in the past without any  issues other than post anesthesia n/v.  No swelling, tenderness, numbness or tingling in her extremities.  No falls or syncope to report.  No smoking or ETOH use.  She has maintained a good appetite and is staying well hydrated. Her weight is stable at 103 lbs.  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  She was born in [place] but raised in [place]. She has lived in both [place] and [place] as an adult.   ROS: All other 10 point review of systems is negative.   PAST MEDICAL HISTORY:   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 72 years in [place], managed on colchicine   PONV (postoperative nausea and vomiting)     ALLERGIES: Allergies  Allergen Reactions   Amoxicillin     Had fever with flu like symptoms. Has patient had a PCN reaction causing immediate rash, facial/tongue/throat swelling, SOB or lightheadedness with hypotension: no Has patient had a PCN reaction causing severe rash involving mucus membranes or skin necrosis: no Has patient had a PCN reaction that required hospitalization no Has patient had a PCN reaction occurring within the last 10 years: yes If all of the above answers are "NO", then may proceed with Cephalosporin use.   Clopidogrel & Aspirin Itching   Amlodipine  Dizzy, heavy feeling, nauseated, tongue burning   Atorvastatin     Myalgia   Crestor [Rosuvastatin] Itching   Metoprolol Other (See Comments)    ?possibly caused hair loss   Pravastatin Itching   Repatha [Evolocumab]     itching      MEDICATIONS:  Current Outpatient Medications on File Prior to Visit  Medication Sig Dispense Refill   aspirin 81 MG chewable tablet Chew by mouth daily.     benzonatate (TESSALON) 100 MG capsule Take 1 capsule (100 mg total) by mouth 3 (three) times daily as needed for cough. 30  capsule 0   colchicine 0.6 MG tablet Take 1 tablet (0.6 mg total) by mouth 2 (two) times daily. 180 tablet 3   Cranberry-Vitamin C 250-60 MG CAPS Take 2 tablets by mouth daily.     D-MANNOSE PO Take 2 tablets by mouth daily.     diphenhydrAMINE (BENADRYL) 25 MG tablet Take 25 mg by mouth at bedtime.     ezetimibe (ZETIA) 10 MG tablet Take 1 tablet (10 mg total) by mouth daily. 90 tablet 3   FIBER PO Take 2 tablets by mouth daily.     fluticasone (FLONASE) 50 MCG/ACT nasal spray Place 2 sprays into both nostrils daily. 16 g 1   Iron, Ferrous Sulfate, 325 (65 Fe) MG TABS Take 325 mg by mouth daily. 60 tablet 1   levofloxacin (LEVAQUIN) 500 MG tablet 1 tab po x 3 days 3 tablet 0   linaclotide (LINZESS) 290 MCG CAPS capsule Take 1 capsule (290 mcg total) by mouth daily before breakfast. 90 capsule 5   NON FORMULARY Take 0.5 tablets by mouth at bedtime. CBD oil - for pain Gummies     pantoprazole (PROTONIX) 40 MG tablet Take 1 tablet (40 mg total) by mouth daily. 30 tablet 3   Polyethyl Glycol-Propyl Glycol (SYSTANE OP) Place 1 drop into both eyes in the morning and at bedtime.     PREMARIN vaginal cream Place 3.81 Applicatorfuls vaginally 2 (two) times a week. (Patient taking differently: Place 0.5 g vaginally See admin instructions. Insert 0.5 grams vaginally every other night) 30 g 0   rosuvastatin (CRESTOR) 10 MG tablet Take 1 tablet (10 mg total) by mouth every other day. 90 tablet 1   No current facility-administered medications on file prior to visit.     PAST SURGICAL HISTORY Past Surgical History:  Procedure Laterality Date   ABDOMINAL HYSTERECTOMY  08/2019   ANAL RECTAL MANOMETRY N/A 11/12/2020   Procedure: ANO RECTAL MANOMETRY;  Surgeon: Lavena Bullion, DO;  Location: [facility];  Service: Gastroenterology;  Laterality: N/A;   BLADDER SURGERY  08/2019   BREAST REDUCTION SURGERY  2006   CARDIAC CATHETERIZATION N/A 07/17/2016   Procedure: Left Heart Cath and Coronary  Angiography;  Surgeon: Burnell Blanks, MD;  Location: [facility];  Service: Cardiovascular;  Laterality: N/A;   CATARACT EXTRACTION W/ INTRAOCULAR LENS IMPLANT Bilateral    COLONOSCOPY  2015   CORONARY ARTERY BYPASS GRAFT N/A 06/04/2021   Procedure: CORONARY ARTERY BYPASS GRAFTING (CABG), ON PUMP, TIMES FOUR, USING LEFT INTERNAL MAMMARY ARTERY, LEFT RADIAL ARTERY (HARVESTED OPEN), AND RIGHT GREATER SAPHENOUS VEIN (HARVESTED ENDOSCOPICALLY);  Surgeon: Lajuana Matte, MD;  Location: [facility];  Service: Open Heart Surgery;  Laterality: N/A;   EYE SURGERY     LEFT HEART CATH AND CORONARY ANGIOGRAPHY N/A 04/22/2021   Procedure: LEFT HEART CATH AND CORONARY ANGIOGRAPHY;  Surgeon: Belva Crome, MD;  Location: [facility];  Service: Cardiovascular;  Laterality: N/A;   POLYPECTOMY  2015   RADIAL ARTERY HARVEST Left 06/04/2021   Procedure: RADIAL ARTERY HARVEST;  Surgeon: Lajuana Matte, MD;  Location: [facility];  Service: Open Heart Surgery;  Laterality: Left;   TEE WITHOUT CARDIOVERSION N/A 06/04/2021   Procedure: TRANSESOPHAGEAL ECHOCARDIOGRAM (TEE);  Surgeon: Lajuana Matte, MD;  Location: [facility];  Service: Open Heart Surgery;  Laterality: N/A;   TONSILLECTOMY      FAMILY HISTORY: Family History  Problem Relation Age of Onset   Heart disease Mother        died at 30 of heart attack   Hypertension Mother    Heart attack Mother    Heart disease Father        had heart attack at age 65   Heart attack Father    Rectal cancer Father 77   Cancer Paternal Aunt    Stomach cancer Paternal Grandmother    Colon cancer Neg Hx    Colon polyps Neg Hx    Esophageal cancer Neg Hx     SOCIAL HISTORY:  reports that she has never smoked. She has never used smokeless tobacco. She reports that she does not currently use alcohol. She reports that she does not use drugs.  PERFORMANCE STATUS: The patient's performance status is 1 - Symptomatic but completely  ambulatory  PHYSICAL EXAM: Most Recent Vital Signs: Blood pressure 130/73, pulse 85, temperature 98.1 F (36.7 C), temperature source Oral, resp. rate 18, height 5\' 3"  (1.6 m), weight 103 lb 12.8 oz (47.1 kg), SpO2 100 %. BP 130/73 (BP Location: Left Arm, Patient Position: Sitting)    Pulse 85    Temp 98.1 F (36.7 C) (Oral)    Resp 18    Ht 5\' 3"  (1.6 m)    Wt 103 lb 12.8 oz (47.1 kg)    LMP  (LMP Unknown)    SpO2 100%    BMI 18.39 kg/m   General Appearance:    Alert, cooperative, no distress, appears stated age  Head:    Normocephalic, without obvious abnormality, atraumatic  Eyes:    PERRL, conjunctiva/corneas clear, EOM's intact, fundi    benign, both eyes        Throat:   Lips, mucosa, and tongue normal; teeth and gums normal  Neck:   Supple, symmetrical, trachea midline, no adenopathy;    thyroid:  no enlargement/tenderness/nodules; no carotid   bruit or JVD  Back:     Symmetric, no curvature, ROM normal, no CVA tenderness  Lungs:     Clear to auscultation bilaterally, respirations unlabored  Chest Wall:    No tenderness or deformity   Heart:    Regular rate and rhythm, S1 and S2 normal, no murmur, rub   or gallop     Abdomen:     Soft, non-tender, bowel sounds active all four quadrants,    no masses, no organomegaly        Extremities:   Extremities normal, atraumatic, no cyanosis or edema  Pulses:   2+ and symmetric all extremities  Skin:   Skin color, texture, turgor normal, no rashes or lesions  Lymph nodes:   Cervical, supraclavicular, and axillary nodes normal  Neurologic:   CNII-XII intact, normal strength, sensation and reflexes    throughout    LABORATORY DATA:  Results for orders placed or performed in visit on 09/26/21 (from the past 48 hour(s))  CBC with Differential ([facility] Only)  Status: Abnormal (Preliminary result)   Collection Time: 09/26/21 10:05 AM  Result Value Ref Range   WBC Count 9.8 4.0 - 10.5 K/uL   RBC 5.53 (H) 3.87 - 5.11 MIL/uL    Hemoglobin 9.9 (L) 12.0 - 15.0 g/dL    Comment: Reticulocyte Hemoglobin testing may be clinically indicated, consider ordering this additional test [id number]    HCT 36.4 36.0 - 46.0 %   MCV 65.8 (L) 80.0 - 100.0 fL   MCH 17.9 (L) 26.0 - 34.0 pg   MCHC 27.2 (L) 30.0 - 36.0 g/dL   RDW 24.7 (H) 11.5 - 15.5 %   Platelet Count 461 (H) 150 - 400 K/uL   nRBC 0.0 0.0 - 0.2 %    Comment: Performed at [facility] at [facility], [address] [place], [place] [address]   Neutrophils Relative % PENDING %   Neutro Abs PENDING 1.7 - 7.7 K/uL   Band Neutrophils PENDING %   Lymphocytes Relative PENDING %   Lymphs Abs PENDING 0.7 - 4.0 K/uL   Monocytes Relative PENDING %   Monocytes Absolute PENDING 0.1 - 1.0 K/uL   Eosinophils Relative PENDING %   Eosinophils Absolute PENDING 0.0 - 0.5 K/uL   Basophils Relative PENDING %   Basophils Absolute PENDING 0.0 - 0.1 K/uL   WBC Morphology PENDING    RBC Morphology PENDING    Smear Review PENDING    Other PENDING %   nRBC PENDING 0 /100 WBC   Metamyelocytes Relative PENDING %   Myelocytes PENDING %   Promyelocytes Relative PENDING %   Blasts PENDING %   Immature Granulocytes PENDING %   Abs Immature Granulocytes PENDING 0.00 - 0.07 K/uL  Reticulocytes     Status: Abnormal   Collection Time: 09/26/21 10:06 AM  Result Value Ref Range   Retic Ct Pct 1.9 0.4 - 3.1 %   RBC. 5.43 (H) 3.87 - 5.11 MIL/uL   Retic Count, Absolute 101.0 19.0 - 186.0 K/uL   Immature Retic Fract 22.6 (H) 2.3 - 15.9 %    Comment: Performed at [facility] at [facility], [address] [place], [place] [address]      RADIOGRAPHY: No results found.     PATHOLOGY: None  ASSESSMENT/PLAN: Ms. Tribbey is a very pleasant 66 yo caucasian female with iron deficiency anemia.  Anemia work up is pending.  We will get her set up for IV iron and possibly folic acid if needed.  Follow-up in 6 weeks.   All questions were  answered. The patient knows to call the clinic with any problems, questions or concerns. We can certainly see the patient much sooner if necessary.   Lottie Dawson, NP

## 2021-09-27 ENCOUNTER — Telehealth: Payer: Self-pay | Admitting: Family

## 2021-09-27 ENCOUNTER — Other Ambulatory Visit: Payer: Self-pay | Admitting: Family

## 2021-09-27 DIAGNOSIS — K648 Other hemorrhoids: Secondary | ICD-10-CM | POA: Diagnosis not present

## 2021-09-27 DIAGNOSIS — D649 Anemia, unspecified: Secondary | ICD-10-CM | POA: Insufficient documentation

## 2021-09-27 DIAGNOSIS — D509 Iron deficiency anemia, unspecified: Secondary | ICD-10-CM

## 2021-09-27 LAB — ERYTHROPOIETIN: Erythropoietin: 12.9 m[IU]/mL (ref 2.6–18.5)

## 2021-09-27 LAB — FERRITIN: Ferritin: 4 ng/mL — ABNORMAL LOW (ref 11–307)

## 2021-09-28 ENCOUNTER — Other Ambulatory Visit: Payer: Self-pay | Admitting: Medical

## 2021-09-28 NOTE — Telephone Encounter (Signed)
Prior auth initiated via [facility] provider portal PA#: [id number] Valid 09/28/21-09/28/22

## 2021-09-28 NOTE — Telephone Encounter (Signed)
Last Prolia inj 09/18/21 Next Prolia inj due 03/19/22

## 2021-10-01 LAB — HGB FRACTIONATION CASCADE
Hgb A2: 1.9 % (ref 1.8–3.2)
Hgb A: 98.1 % (ref 96.4–98.8)
Hgb F: 0 % (ref 0.0–2.0)
Hgb S: 0 %

## 2021-10-02 ENCOUNTER — Inpatient Hospital Stay: Payer: Medicare Other

## 2021-10-02 ENCOUNTER — Other Ambulatory Visit: Payer: Self-pay

## 2021-10-02 ENCOUNTER — Encounter: Payer: Self-pay | Admitting: *Deleted

## 2021-10-02 VITALS — BP 121/69 | HR 76 | Temp 98.4°F | Resp 18

## 2021-10-02 DIAGNOSIS — K648 Other hemorrhoids: Secondary | ICD-10-CM | POA: Diagnosis not present

## 2021-10-02 DIAGNOSIS — D509 Iron deficiency anemia, unspecified: Secondary | ICD-10-CM

## 2021-10-02 DIAGNOSIS — D649 Anemia, unspecified: Secondary | ICD-10-CM

## 2021-10-02 MED ORDER — SODIUM CHLORIDE 0.9 % IV SOLN: Freq: Once | INTRAVENOUS | Status: AC

## 2021-10-02 MED ORDER — SODIUM CHLORIDE 0.9 % IV SOLN
300.0000 mg | Freq: Once | INTRAVENOUS | Status: AC
  Administered 2021-10-02: 12:00:00 300 mg via INTRAVENOUS
  Filled 2021-10-02: qty 300

## 2021-10-02 NOTE — Patient Instructions (Signed)

## 2021-10-09 ENCOUNTER — Other Ambulatory Visit: Payer: Self-pay

## 2021-10-09 ENCOUNTER — Inpatient Hospital Stay: Payer: Medicare Other | Attending: Hematology & Oncology

## 2021-10-09 ENCOUNTER — Other Ambulatory Visit: Payer: Self-pay | Admitting: Family

## 2021-10-09 VITALS — BP 123/62 | HR 85 | Temp 98.0°F | Resp 17

## 2021-10-09 DIAGNOSIS — K922 Gastrointestinal hemorrhage, unspecified: Secondary | ICD-10-CM | POA: Diagnosis present

## 2021-10-09 DIAGNOSIS — D649 Anemia, unspecified: Secondary | ICD-10-CM

## 2021-10-09 DIAGNOSIS — D500 Iron deficiency anemia secondary to blood loss (chronic): Secondary | ICD-10-CM | POA: Diagnosis present

## 2021-10-09 DIAGNOSIS — K648 Other hemorrhoids: Secondary | ICD-10-CM | POA: Insufficient documentation

## 2021-10-09 DIAGNOSIS — D509 Iron deficiency anemia, unspecified: Secondary | ICD-10-CM

## 2021-10-09 MED ORDER — SODIUM CHLORIDE 0.9 % IV SOLN
40.0000 mg | Freq: Once | INTRAVENOUS | Status: AC
  Administered 2021-10-09: 40 mg via INTRAVENOUS
  Filled 2021-10-09: qty 4

## 2021-10-09 MED ORDER — SODIUM CHLORIDE 0.9 % IV SOLN
300.0000 mg | Freq: Once | INTRAVENOUS | Status: AC
  Administered 2021-10-09: 300 mg via INTRAVENOUS
  Filled 2021-10-09: qty 300

## 2021-10-09 MED ORDER — METHYLPREDNISOLONE SODIUM SUCC 125 MG IJ SOLR
60.0000 mg | Freq: Once | INTRAMUSCULAR | Status: AC
  Administered 2021-10-09: 60 mg via INTRAVENOUS
  Filled 2021-10-09: qty 2

## 2021-10-09 MED ORDER — SODIUM CHLORIDE 0.9 % IV SOLN
40.0000 mg | Freq: Once | INTRAVENOUS | Status: DC
  Filled 2021-10-09: qty 4

## 2021-10-09 MED ORDER — SODIUM CHLORIDE 0.9 % IV SOLN: Freq: Once | INTRAVENOUS | Status: AC

## 2021-10-14 ENCOUNTER — Other Ambulatory Visit: Payer: Self-pay | Admitting: Medical

## 2021-10-14 NOTE — Progress Notes (Signed)
Cardiology Office Note:   Date:  10/15/2021  NAME:  Katrina Walters    MRN: [id number] DOB:  May 01, 1956   PCP:  Mackie Pai, PA-C  Cardiologist:  Evalina Field, MD  Electrophysiologist:  None   Referring MD: Elise Benne   Chief Complaint  Patient presents with   Follow-up         History of Present Illness:   Katrina Walters is a 66 y.o. female with a hx of CAD status post CABG, hyperlipidemia who presents for follow-up.  LDL cholesterol back down.  64.  She is on Crestor 10 mg every other day.  Also on Zetia 10 mg daily.  She seems to be tolerating this regimen well.  She did not qualify for Leqvio.  She is intolerant of Repatha.  She is okay to try taking Crestor daily.  She is walking 35 to 40 minutes/day.  No significant chest pain or trouble breathing.  She does describe flutters in her chest that may occur every 2 weeks.  She reports last seconds.  Symptoms resolved very quickly.  There is nothing concerning.  Echocardiogram showed normal LV function.  She denies any significant chest pain symptoms.  Overall doing well since surgery.  She is undergoing iron infusions.  Apparently she was diagnosed with iron deficiency.  No bleeding on colonoscopy last year.  She will complete this.  Problem List CAD -3v CAD -CABG 06/04/2021 -LIMA-LAD, SVG-OM, SVG-PDA, SVG-Diag 2. HLD -Intolerant of high intensity statin -Pruritus on repatha/praluent -Total chol 120, HDL 34, LDL 64, triglycerides 118 3. Mediterranean Fever 4. Iron deficiency anemia   Past Medical History: 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 26 years in [place], managed on colchicine   PONV (postoperative nausea and vomiting)     Past Surgical History: Past Surgical History:  Procedure Laterality Date   ABDOMINAL HYSTERECTOMY  08/2019   ANAL RECTAL MANOMETRY  N/A 11/12/2020   Procedure: ANO RECTAL MANOMETRY;  Surgeon: Lavena Bullion, DO;  [facility]: [facility];  Service: Gastroenterology;  Laterality: N/A;   BLADDER SURGERY  08/2019   BREAST REDUCTION SURGERY  2006   CARDIAC CATHETERIZATION N/A 07/17/2016   Procedure: Left Heart Cath and Coronary Angiography;  Surgeon: Burnell Blanks, MD;  Location: [facility];  Service: Cardiovascular;  Laterality: N/A;   CATARACT EXTRACTION W/ INTRAOCULAR LENS IMPLANT Bilateral    COLONOSCOPY  2015   CORONARY ARTERY BYPASS GRAFT N/A 06/04/2021   Procedure: CORONARY ARTERY BYPASS GRAFTING (CABG), ON PUMP, TIMES FOUR, USING LEFT INTERNAL MAMMARY ARTERY, LEFT RADIAL ARTERY (HARVESTED OPEN), AND RIGHT GREATER SAPHENOUS VEIN (HARVESTED ENDOSCOPICALLY);  Surgeon: Lajuana Matte, MD;  Location: [facility];  Service: Open Heart Surgery;  Laterality: N/A;   EYE SURGERY     LEFT HEART CATH AND CORONARY ANGIOGRAPHY N/A 04/22/2021   Procedure: LEFT HEART CATH AND CORONARY ANGIOGRAPHY;  Surgeon: Belva Crome, MD;  Location: [facility];  Service: Cardiovascular;  Laterality: N/A;   POLYPECTOMY  2015   RADIAL ARTERY HARVEST Left 06/04/2021   Procedure: RADIAL ARTERY HARVEST;  Surgeon: Lajuana Matte, MD;  Location: [facility];  Service: Open Heart Surgery;  Laterality: Left;   TEE WITHOUT CARDIOVERSION N/A 06/04/2021   Procedure: TRANSESOPHAGEAL ECHOCARDIOGRAM (TEE);  Surgeon: Lajuana Matte, MD;  [facility];  Service: Open Heart Surgery;  Laterality: N/A;   TONSILLECTOMY      Current Medications: Current Meds  Medication Sig   aspirin 81 MG chewable tablet Chew by mouth daily.   colchicine 0.6 MG tablet Take 1 tablet (0.6 mg total) by mouth 2 (two) times daily.   Cranberry-Vitamin C 250-60 MG CAPS Take 2 tablets by mouth daily.   D-MANNOSE PO Take 2 tablets by mouth daily.   diphenhydrAMINE (BENADRYL) 25 MG tablet Take 25 mg by mouth at bedtime.   ezetimibe (ZETIA) 10 MG tablet  Take 1 tablet (10 mg total) by mouth daily.   FIBER PO Take 2 tablets by mouth daily.   fluticasone (FLONASE) 50 MCG/ACT nasal spray SPRAY 2 SPRAYS INTO EACH NOSTRIL EVERY DAY   Iron, Ferrous Sulfate, 325 (65 Fe) MG TABS Take 325 mg by mouth daily.   linaclotide (LINZESS) 290 MCG CAPS capsule Take 1 capsule (290 mcg total) by mouth daily before breakfast.   NON FORMULARY Take 0.5 tablets by mouth at bedtime. CBD oil - for pain Gummies   pantoprazole (PROTONIX) 40 MG tablet Take 1 tablet (40 mg total) by mouth daily.   Polyethyl Glycol-Propyl Glycol (SYSTANE OP) Place 1 drop into both eyes in the morning and at bedtime.   PREMARIN vaginal cream Place 3.23 Applicatorfuls vaginally 2 (two) times a week. (Patient taking differently: Place 0.5 g vaginally See admin instructions. Insert 0.5 grams vaginally every other night)   rosuvastatin (CRESTOR) 10 MG tablet Take 1 tablet (10 mg total) by mouth every other day.     Allergies:    Amoxicillin, Clopidogrel & aspirin, Amlodipine, Atorvastatin, Crestor [rosuvastatin], Metoprolol, Pravastatin, and Repatha [evolocumab]   Social History: Social History   Socioeconomic History   Marital status: Married    Spouse name: Not on file   Number of children: Not on file   Years of education: Not on file   Highest education level: Not on file  Occupational History   Occupation: unemployed at the time  Tobacco Use   Smoking status: Never   Smokeless tobacco: Never  Vaping Use   Vaping Use: Never used  Substance and Sexual Activity   Alcohol use: Not Currently    Alcohol/week: 0.0 standard drinks    Comment: occassionally   Drug use: No    Comment: CBD gummies   Sexual activity: Not on file  Other Topics Concern   Not on file  Social History Narrative   Not on file   Social Determinants of Health   Financial Resource Strain: Not on file  Food Insecurity: Not on file  Transportation Needs: Not on file  Physical Activity: Not on file   Stress: Not on file  Social Connections: Not on file     Family History: The patient's family history includes Cancer in her paternal aunt; Heart attack in her father and mother; Heart disease in her father and mother; Hypertension in her mother; Rectal cancer (age of onset: 45) in her father; Stomach cancer in her paternal grandmother. There is no history of Colon cancer, Colon polyps, or Esophageal cancer.  ROS:   All other ROS reviewed and negative. Pertinent positives noted in the HPI.     EKGs/Labs/Other Studies Reviewed:   The following studies were personally reviewed by me today:  EKG:  EKG is ordered today.  The ekg ordered today demonstrates normal sinus rhythm heart rate 72, lateral infarct, inferior infarct, and was personally reviewed by me.  TTE 05/08/2021  1. Left ventricular ejection fraction, by estimation, is 55 to 60%. The  left ventricle has normal function. The left ventricle demonstrates  regional wall motion abnormalities (see scoring diagram/findings for  description). Left ventricular diastolic  parameters are consistent with Grade I diastolic dysfunction (impaired  relaxation). The average left ventricular global longitudinal strain is  -12.6 %. The global longitudinal strain is abnormal.   2. Right ventricular systolic function is normal. The right ventricular  size is normal.   3. The mitral valve is normal in structure. No evidence of mitral valve  regurgitation. No evidence of mitral stenosis.   4. The aortic valve is tricuspid. Aortic valve regurgitation is not  visualized. No aortic stenosis is present.   5. The inferior vena cava is normal in size with greater than 50%  respiratory variability, suggesting right atrial pressure of 3 mmHg.   Recent Labs: 06/05/2021: Magnesium 2.3 09/26/2021: ALT 10; BUN 13; Creatinine 0.79; Hemoglobin 9.9; Platelet Count 461; Potassium 4.4; Sodium 138   Recent Lipid Panel    Component Value Date/Time   CHOL 120  09/17/2021 1110   TRIG 118 09/17/2021 1110   HDL 34 (L) 09/17/2021 1110   CHOLHDL 3.5 09/17/2021 1110   CHOLHDL 3 02/01/2021 1146   VLDL 15.4 02/01/2021 1146   LDLCALC 64 09/17/2021 1110    Physical Exam:   VS:  BP 106/74 (BP Location: Left Arm, Patient Position: Sitting, Cuff Size: Small)    Pulse 79    Ht 5\' 3"  (1.6 m)    Wt 104 lb 12.8 oz (47.5 kg)    LMP  (LMP Unknown)    SpO2 100%    BMI 18.56 kg/m    Wt Readings from Last 3 Encounters:  10/15/21 104 lb 12.8 oz (47.5 kg)  09/26/21 103 lb 12.8 oz (47.1 kg)  09/03/21 101 lb 4 oz (45.9 kg)    General: Well nourished, well developed, in no acute distress Head: Atraumatic, normal size  Eyes: PEERLA, EOMI  Neck: Supple, no JVD Endocrine: No thryomegaly Cardiac: Normal S1, S2; RRR; no murmurs, rubs, or gallops Lungs: Clear to auscultation bilaterally, no wheezing, rhonchi or rales  Abd: Soft, nontender, no hepatomegaly  Ext: No edema, pulses 2+ Musculoskeletal: No deformities, BUE and BLE strength normal and equal Skin: Warm and dry, no rashes   Neuro: Alert and oriented to person, place, time, and situation, CNII-XII grossly intact, no focal deficits  Psych: Normal mood and affect   ASSESSMENT:   Katrina Walters is a 66 y.o. female who presents for the following: 1. Coronary artery disease involving coronary bypass graft of native heart without angina pectoris   2. S/P CABG x 4   3. Mixed hyperlipidemia     PLAN:   1. Coronary artery disease involving coronary bypass graft of native heart without angina pectoris 2. S/P CABG x 4 3. Mixed hyperlipidemia -Status post four-vessel CABG on 06/04/2021.  Doing well since surgery.  Normal LV function.  Denies any significant chest pain or trouble breathing.  She is exercising appropriately.  Blood pressure is well controlled.  Most recent LDL cholesterol 64.  She is on Crestor 10 mg every other day as well as Zetia 10 mg daily.  She is intolerant of Repatha.  Do not qualify for Leqvio.  We  discussed trying Crestor 10 mg daily.  She is okay to do this.  She seems to be tolerating every other day fine.  I will see her back in 6  months for repeat evaluation as well as lipids 1 week before.  Goal LDL cholesterol is less than 50.  She is close to the goal.     Disposition: Return in about 6 months (around 04/14/2022).  Medication Adjustments/Labs and Tests Ordered: Current medicines are reviewed at length with the patient today.  Concerns regarding medicines are outlined above.  Orders Placed This Encounter  Procedures   Lipid panel   EKG 12-Lead   No orders of the defined types were placed in this encounter.   Patient Instructions  Medication Instructions:  Take Crestor daily   *If you need a refill on your cardiac medications before your next appointment, please call your pharmacy*   Lab Work: LIPID (1 week before 6 month appointment)   If you have labs (blood work) drawn today and your tests are completely normal, you will receive your results only by: [place] (if you have MyChart) OR A paper copy in the mail If you have any lab test that is abnormal or we need to change your treatment, we will call you to review the results.  Follow-Up: At [facility], you and your health needs are our priority.  As part of our continuing mission to provide you with exceptional heart care, we have created designated Provider Care Teams.  These Care Teams include your primary Cardiologist (physician) and Advanced Practice Providers (APPs -  Physician Assistants and Nurse Practitioners) who all work together to provide you with the care you need, when you need it.  We recommend signing up for the patient portal called "MyChart".  Sign up information is provided on this After Visit Summary.  MyChart is used to connect with patients for Virtual Visits (Telemedicine).  Patients are able to view lab/test results, encounter notes, upcoming appointments, etc.  Non-urgent messages can  be sent to your provider as well.   To learn more about what you can do with MyChart, go to [email or web address].    Your next appointment:   6 month(s)  The format for your next appointment:   In Person  Provider:   Evalina Field, MD       Time Spent with Patient: I have spent a total of 35 minutes with patient reviewing hospital notes, telemetry, EKGs, labs and examining the patient as well as establishing an assessment and plan that was discussed with the patient.  > 50% of time was spent in direct patient care.  Signed, Addison Naegeli. Audie Box, MD, [facility]  [address] [place], [place] [address] [phone]  10/15/2021 4:53 PM

## 2021-10-15 ENCOUNTER — Ambulatory Visit: Payer: Medicare Other | Admitting: Cardiovascular Disease

## 2021-10-15 ENCOUNTER — Encounter: Payer: Self-pay | Admitting: Cardiovascular Disease

## 2021-10-15 ENCOUNTER — Other Ambulatory Visit: Payer: Self-pay

## 2021-10-15 VITALS — BP 106/74 | HR 79 | Ht 63.0 in | Wt 104.8 lb

## 2021-10-15 DIAGNOSIS — E782 Mixed hyperlipidemia: Secondary | ICD-10-CM | POA: Diagnosis not present

## 2021-10-15 DIAGNOSIS — Z951 Presence of aortocoronary bypass graft: Secondary | ICD-10-CM | POA: Diagnosis not present

## 2021-10-15 DIAGNOSIS — I25810 Atherosclerosis of coronary artery bypass graft(s) without angina pectoris: Secondary | ICD-10-CM

## 2021-10-15 NOTE — Patient Instructions (Signed)
Medication Instructions:  Take Crestor daily   *If you need a refill on your cardiac medications before your next appointment, please call your pharmacy*   Lab Work: LIPID (1 week before 6 month appointment)   If you have labs (blood work) drawn today and your tests are completely normal, you will receive your results only by: [place] (if you have MyChart) OR A paper copy in the mail If you have any lab test that is abnormal or we need to change your treatment, we will call you to review the results.  Follow-Up: At [facility], you and your health needs are our priority.  As part of our continuing mission to provide you with exceptional heart care, we have created designated Provider Care Teams.  These Care Teams include your primary Cardiologist (physician) and Advanced Practice Providers (APPs -  Physician Assistants and Nurse Practitioners) who all work together to provide you with the care you need, when you need it.  We recommend signing up for the patient portal called "MyChart".  Sign up information is provided on this After Visit Summary.  MyChart is used to connect with patients for Virtual Visits (Telemedicine).  Patients are able to view lab/test results, encounter notes, upcoming appointments, etc.  Non-urgent messages can be sent to your provider as well.   To learn more about what you can do with MyChart, go to [email or web address].    Your next appointment:   6 month(s)  The format for your next appointment:   In Person  Provider:   Evalina Field, MD

## 2021-10-16 ENCOUNTER — Inpatient Hospital Stay: Payer: Medicare Other

## 2021-10-16 ENCOUNTER — Other Ambulatory Visit: Payer: Self-pay | Admitting: Family

## 2021-10-16 ENCOUNTER — Other Ambulatory Visit: Payer: Self-pay | Admitting: Medical

## 2021-10-16 VITALS — BP 114/61 | HR 76 | Temp 98.6°F | Resp 18

## 2021-10-16 DIAGNOSIS — K648 Other hemorrhoids: Secondary | ICD-10-CM | POA: Diagnosis not present

## 2021-10-16 DIAGNOSIS — D649 Anemia, unspecified: Secondary | ICD-10-CM

## 2021-10-16 DIAGNOSIS — D509 Iron deficiency anemia, unspecified: Secondary | ICD-10-CM

## 2021-10-16 MED ORDER — SODIUM CHLORIDE 0.9 % IV SOLN
300.0000 mg | Freq: Once | INTRAVENOUS | Status: AC
  Administered 2021-10-16: 300 mg via INTRAVENOUS
  Filled 2021-10-16: qty 300

## 2021-10-16 MED ORDER — METHYLPREDNISOLONE SODIUM SUCC 125 MG IJ SOLR
60.0000 mg | Freq: Once | INTRAMUSCULAR | Status: AC
  Administered 2021-10-16: 60 mg via INTRAVENOUS
  Filled 2021-10-16: qty 2

## 2021-10-16 MED ORDER — SODIUM CHLORIDE 0.9 % IV SOLN
40.0000 mg | Freq: Once | INTRAVENOUS | Status: AC
  Administered 2021-10-16 (×2): 40 mg via INTRAVENOUS
  Filled 2021-10-16: qty 4

## 2021-10-16 MED ORDER — SODIUM CHLORIDE 0.9 % IV SOLN: Freq: Once | INTRAVENOUS | Status: AC

## 2021-10-16 MED ORDER — SODIUM CHLORIDE 0.9 % IV SOLN
40.0000 mg | Freq: Once | INTRAVENOUS | Status: DC
  Filled 2021-10-16: qty 4

## 2021-10-16 NOTE — Patient Instructions (Signed)

## 2021-10-19 ENCOUNTER — Other Ambulatory Visit: Payer: Self-pay | Admitting: Medical

## 2021-10-23 ENCOUNTER — Other Ambulatory Visit: Payer: Self-pay

## 2021-10-23 ENCOUNTER — Other Ambulatory Visit (HOSPITAL_COMMUNITY)
Admission: RE | Admit: 2021-10-23 | Discharge: 2021-10-23 | Disposition: A | Discharge status: Home / Self Care | Payer: Medicare Other | Source: Ambulatory Visit | Attending: Family Medicine | Admitting: Family Medicine

## 2021-10-23 ENCOUNTER — Encounter: Payer: Self-pay | Admitting: Family Medicine

## 2021-10-23 ENCOUNTER — Ambulatory Visit (INDEPENDENT_AMBULATORY_CARE_PROVIDER_SITE_OTHER): Payer: Medicare Other | Admitting: Family Medicine

## 2021-10-23 VITALS — BP 120/68 | HR 75 | Ht 63.0 in | Wt 102.0 lb

## 2021-10-23 DIAGNOSIS — N898 Other specified noninflammatory disorders of vagina: Secondary | ICD-10-CM | POA: Insufficient documentation

## 2021-10-23 DIAGNOSIS — N390 Urinary tract infection, site not specified: Secondary | ICD-10-CM | POA: Diagnosis not present

## 2021-10-23 DIAGNOSIS — Z01419 Encounter for gynecological examination (general) (routine) without abnormal findings: Secondary | ICD-10-CM | POA: Diagnosis present

## 2021-10-23 DIAGNOSIS — Z1231 Encounter for screening mammogram for malignant neoplasm of breast: Secondary | ICD-10-CM | POA: Diagnosis not present

## 2021-10-23 MED ORDER — PREMARIN 0.625 MG/GM VA CREA: 0.5000 g | TOPICAL_CREAM | VAGINAL | 6 refills | Status: DC

## 2021-10-23 MED ORDER — NITROFURANTOIN MONOHYD MACRO 100 MG PO CAPS: 100.0000 mg | ORAL_CAPSULE | Freq: Two times a day (BID) | ORAL | 3 refills | Status: DC

## 2021-10-23 NOTE — Progress Notes (Signed)
Last mammo 09/22/2018.  Patient had open heart surgery in Sept 2023  Patient complaining of repeat infections after intercourse.Patient states she usually has burning and itching with these infections. Patient would like some permeant solution.

## 2021-10-23 NOTE — Progress Notes (Signed)
GYNECOLOGY ANNUAL PREVENTATIVE CARE ENCOUNTER NOTE  Subjective:   Katrina Walters is a 66 y.o. G64P1021 female here for a routine annual gynecologic exam.  Current complaints: recurrent UTI symptoms after intercourse. Had prescription for macrobid to take after sex. Still has been getting some symptoms. Is using premarin about every other day to help with this. This was prescribed by her urogynecologist..   Denies abnormal vaginal bleeding, discharge, pelvic pain, problems with intercourse or other gynecologic concerns.    Gynecologic History No LMP recorded (lmp unknown). Patient is postmenopausal. Patient is sexually active  Contraception: post menopausal status Last Pap: normal. Results were: normal Last mammogram: 2020. Results were: normal   The pregnancy intention screening data noted above was reviewed. Potential methods of contraception were discussed. The patient elected to proceed with No data recorded.   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   PONV (postoperative nausea and vomiting)     Past Surgical History:  Procedure Laterality Date   ABDOMINAL HYSTERECTOMY  08/2019   ANAL RECTAL MANOMETRY N/A 11/12/2020   Procedure: ANO RECTAL MANOMETRY;  Surgeon: Lavena Bullion, DO;  [facility];  Service: Gastroenterology;  Laterality: N/A;   BLADDER SURGERY  08/2019   BREAST REDUCTION SURGERY  2006   CARDIAC CATHETERIZATION N/A 07/17/2016   Procedure: Left Heart Cath and Coronary Angiography;  Surgeon: Burnell Blanks, MD;  Location: [facility];  Service: Cardiovascular;  Laterality: N/A;   CATARACT EXTRACTION W/ INTRAOCULAR LENS IMPLANT Bilateral    COLONOSCOPY  2015   CORONARY ARTERY BYPASS GRAFT N/A 06/04/2021   Procedure: CORONARY ARTERY BYPASS GRAFTING (CABG), ON PUMP, TIMES FOUR, USING LEFT INTERNAL MAMMARY ARTERY, LEFT RADIAL ARTERY (HARVESTED OPEN), AND RIGHT GREATER SAPHENOUS VEIN (HARVESTED ENDOSCOPICALLY);  Surgeon: Lajuana Matte, MD;  Location: [facility];  Service: Open Heart Surgery;  Laterality: N/A;   EYE SURGERY     LEFT HEART CATH AND CORONARY ANGIOGRAPHY N/A 04/22/2021   Procedure: LEFT HEART CATH AND CORONARY ANGIOGRAPHY;  Surgeon: Belva Crome, MD;  Location: [facility];  Service: Cardiovascular;  Laterality: N/A;   POLYPECTOMY  2015   RADIAL ARTERY HARVEST Left 06/04/2021   Procedure: RADIAL ARTERY HARVEST;  Surgeon: Lajuana Matte, MD;  Location: [facility];  Service: Open Heart Surgery;  Laterality: Left;   TEE WITHOUT CARDIOVERSION N/A 06/04/2021   Procedure: TRANSESOPHAGEAL ECHOCARDIOGRAM (TEE);  Surgeon: Lajuana Matte, MD;  Location: [facility];  Service: Open Heart Surgery;  Laterality: N/A;   TONSILLECTOMY      Current Outpatient Medications on File Prior to Visit  Medication Sig Dispense Refill   aspirin 81 MG chewable tablet Chew by mouth daily.     colchicine 0.6  MG tablet Take 1 tablet (0.6 mg total) by mouth 2 (two) times daily. 180 tablet 3   Cranberry-Vitamin C 250-60 MG CAPS Take 2 tablets by mouth daily.     D-MANNOSE PO Take 2 tablets by mouth daily.     diphenhydrAMINE (BENADRYL) 25 MG tablet Take 25 mg by mouth at bedtime.     ezetimibe (ZETIA) 10 MG tablet Take 1 tablet (10 mg total) by mouth daily. 90 tablet 3   FIBER PO Take 2 tablets by mouth daily.     fluticasone (FLONASE) 50 MCG/ACT nasal spray SPRAY 2 SPRAYS INTO EACH NOSTRIL EVERY DAY 48 mL 1   Iron, Ferrous Sulfate, 325 (65 Fe) MG TABS Take 325 mg by mouth daily. 60 tablet 1    levofloxacin (LEVAQUIN) 500 MG tablet 1 tab po x 3 days 3 tablet 0   linaclotide (LINZESS) 290 MCG CAPS capsule Take 1 capsule (290 mcg total) by mouth daily before breakfast. 90 capsule 5   NON FORMULARY Take 0.5 tablets by mouth at bedtime. CBD oil - for pain Gummies     pantoprazole (PROTONIX) 40 MG tablet TAKE 1 TABLET BY MOUTH EVERY DAY 90 tablet 1   Polyethyl Glycol-Propyl Glycol (SYSTANE OP) Place 1 drop into both eyes in the morning and at bedtime.     rosuvastatin (CRESTOR) 10 MG tablet Take 1 tablet (10 mg total) by mouth every other day. 90 tablet 1   benzonatate (TESSALON) 100 MG capsule Take 1 capsule (100 mg total) by mouth 3 (three) times daily as needed for cough. (Patient not taking: Reported on 10/23/2021) 30 capsule 0   No current facility-administered medications on file prior to visit.    Allergies  Allergen Reactions   Amoxicillin     Had fever with flu like symptoms. Has patient had a PCN reaction causing immediate rash, facial/tongue/throat swelling, SOB or lightheadedness with hypotension: no Has patient had a PCN reaction causing severe rash involving mucus membranes or skin necrosis: no Has patient had a PCN reaction that required hospitalization no Has patient had a PCN reaction occurring within the last 10 years: yes If all of the above answers are "NO", then may proceed with Cephalosporin use.   Clopidogrel & Aspirin Itching   Amlodipine     Dizzy, heavy feeling, nauseated, tongue burning   Atorvastatin     Myalgia   Crestor [Rosuvastatin] Itching   Metoprolol Other (See Comments)    ?possibly caused hair loss   Pravastatin Itching   Repatha [Evolocumab]     itching    Social History   Socioeconomic History   Marital status: Married    Spouse name: Not on file   Number of children: Not on file   Years of education: Not on file   Highest education level: Not on file  Occupational History   Occupation: unemployed at the time  Tobacco Use    Smoking status: Never   Smokeless tobacco: Never  Vaping Use   Vaping Use: Never used  Substance and Sexual Activity   Alcohol use: Not Currently    Alcohol/week: 0.0 standard drinks    Comment: occassionally   Drug use: No    Comment: CBD gummies   Sexual activity: Yes  Other Topics Concern   Not on file  Social History Narrative   Not on file   Social Determinants of Health   Financial Resource Strain: Not on file  Food Insecurity: Not on file  Transportation Needs: Not on file  Physical Activity: Not on file  Stress: Not on file  Social Connections: Not on file  Intimate Partner Violence: Not on file    Family History  Problem Relation Age of Onset   Heart disease Mother        died at 21 of heart attack   Hypertension Mother    Heart attack Mother    Heart disease Father        had heart attack at age 17   Heart attack Father    Rectal cancer Father 26   Cancer Paternal Aunt    Stomach cancer Paternal Grandmother    Colon cancer Neg Hx    Colon polyps Neg Hx    Esophageal cancer Neg Hx     The following portions of the patient's history were reviewed and updated as appropriate: allergies, current medications, past family history, past medical history, past social history, past surgical history and problem list.  Review of Systems Pertinent items are noted in HPI.   Objective:  BP 120/68    Pulse 75    Ht 5\' 3"  (1.6 m)    Wt 102 lb (46.3 kg)    LMP  (LMP Unknown)    BMI 18.07 kg/m  Wt Readings from Last 3 Encounters:  10/23/21 102 lb (46.3 kg)  10/15/21 104 lb 12.8 oz (47.5 kg)  09/26/21 103 lb 12.8 oz (47.1 kg)     Chaperone present during exam  CONSTITUTIONAL: Well-developed, well-nourished female in no acute distress.  HENT:  Normocephalic, atraumatic, External right and left ear normal. Oropharynx is clear and moist EYES: Conjunctivae and EOM are normal. Pupils are equal, round, and reactive to light. No scleral icterus.  NECK: Normal range of  motion, supple, no masses.  Normal thyroid.   CARDIOVASCULAR: Normal heart rate noted, regular rhythm RESPIRATORY: Clear to auscultation bilaterally. Effort and breath sounds normal, no problems with respiration noted. BREASTS: Symmetric in size. No masses, skin changes, nipple drainage, or lymphadenopathy. ABDOMEN: Soft, normal bowel sounds, no distention noted.  No tenderness, rebound or guarding.  PELVIC: Normal appearing external genitalia; normal appearing vaginal mucosa and cervix.  No abnormal discharge noted.  Normal uterine size, no other palpable masses, no uterine or adnexal tenderness. MUSCULOSKELETAL: Normal range of motion. No tenderness.  No cyanosis, clubbing, or edema.  2+ distal pulses. SKIN: Skin is warm and dry. No rash noted. Not diaphoretic. No erythema. No pallor. NEUROLOGIC: Alert and oriented to person, place, and time. Normal reflexes, muscle tone coordination. No cranial nerve deficit noted. PSYCHIATRIC: Normal mood and affect. Normal behavior. Normal judgment and thought content.  Assessment:  Annual gynecologic examination with pap smear   Plan:  1. Well Woman Exam Will follow up results of pap smear and manage accordingly. Mammogram scheduled - Cervicovaginal ancillary only( [place])  2. Encounter for screening mammogram for malignant neoplasm of breast - MM 3D SCREEN BREAST BILATERAL; Future  3. Vaginal discharge - Cervicovaginal ancillary only( [place])  4. Recurrent UTI Continue D-mannose at bedtime Premarin cream Macrobid every night. If develops symptoms, come to office for culture F/u in 6 months   Routine preventative health maintenance measures emphasized. Please refer to After Visit Summary for other counseling recommendations.    Loma Boston, [place] for [employer]

## 2021-10-24 ENCOUNTER — Ambulatory Visit (HOSPITAL_BASED_OUTPATIENT_CLINIC_OR_DEPARTMENT_OTHER)
Admission: RE | Admit: 2021-10-24 | Discharge: 2021-10-24 | Disposition: A | Discharge status: Home / Self Care | Payer: Medicare Other | Source: Ambulatory Visit | Attending: Family Medicine | Admitting: Family Medicine

## 2021-10-24 ENCOUNTER — Encounter (HOSPITAL_BASED_OUTPATIENT_CLINIC_OR_DEPARTMENT_OTHER): Payer: Self-pay

## 2021-10-24 DIAGNOSIS — Z1231 Encounter for screening mammogram for malignant neoplasm of breast: Secondary | ICD-10-CM | POA: Diagnosis not present

## 2021-10-24 LAB — CERVICOVAGINAL ANCILLARY ONLY
Bacterial Vaginitis (gardnerella): NEGATIVE
Candida Glabrata: NEGATIVE
Candida Vaginitis: NEGATIVE
Comment: NEGATIVE
Comment: NEGATIVE
Comment: NEGATIVE

## 2021-10-27 ENCOUNTER — Other Ambulatory Visit: Payer: Self-pay | Admitting: Medical

## 2021-11-08 ENCOUNTER — Other Ambulatory Visit: Payer: Self-pay | Admitting: Family

## 2021-11-08 ENCOUNTER — Inpatient Hospital Stay (HOSPITAL_BASED_OUTPATIENT_CLINIC_OR_DEPARTMENT_OTHER): Payer: Medicare Other | Admitting: Family

## 2021-11-08 ENCOUNTER — Other Ambulatory Visit: Payer: Self-pay

## 2021-11-08 ENCOUNTER — Inpatient Hospital Stay: Payer: Medicare Other | Attending: Family

## 2021-11-08 ENCOUNTER — Encounter: Payer: Self-pay | Admitting: Family

## 2021-11-08 VITALS — BP 115/74 | HR 72 | Temp 98.1°F | Resp 17 | Wt 105.8 lb

## 2021-11-08 DIAGNOSIS — D500 Iron deficiency anemia secondary to blood loss (chronic): Secondary | ICD-10-CM | POA: Diagnosis present

## 2021-11-08 DIAGNOSIS — D509 Iron deficiency anemia, unspecified: Secondary | ICD-10-CM

## 2021-11-08 DIAGNOSIS — K648 Other hemorrhoids: Secondary | ICD-10-CM | POA: Diagnosis not present

## 2021-11-08 DIAGNOSIS — K922 Gastrointestinal hemorrhage, unspecified: Secondary | ICD-10-CM | POA: Diagnosis present

## 2021-11-08 LAB — CBC WITH DIFFERENTIAL (CANCER CENTER ONLY)
Abs Immature Granulocytes: 0.04 10*3/uL (ref 0.00–0.07)
Basophils Absolute: 0.1 10*3/uL (ref 0.0–0.1)
Basophils Relative: 1 %
Eosinophils Absolute: 0.4 10*3/uL (ref 0.0–0.5)
Eosinophils Relative: 4 %
HCT: 43.5 % (ref 36.0–46.0)
Hemoglobin: 12.9 g/dL (ref 12.0–15.0)
Immature Granulocytes: 1 %
Lymphocytes Relative: 23 %
Lymphs Abs: 1.9 10*3/uL (ref 0.7–4.0)
MCH: 23 pg — ABNORMAL LOW (ref 26.0–34.0)
MCHC: 29.7 g/dL — ABNORMAL LOW (ref 30.0–36.0)
MCV: 77.4 fL — ABNORMAL LOW (ref 80.0–100.0)
Monocytes Absolute: 0.6 10*3/uL (ref 0.1–1.0)
Monocytes Relative: 7 %
Neutro Abs: 5.3 10*3/uL (ref 1.7–7.7)
Neutrophils Relative %: 64 %
Platelet Count: 406 10*3/uL — ABNORMAL HIGH (ref 150–400)
RBC: 5.62 MIL/uL — ABNORMAL HIGH (ref 3.87–5.11)
WBC Count: 8.2 10*3/uL (ref 4.0–10.5)
nRBC: 0 % (ref 0.0–0.2)

## 2021-11-08 LAB — RETICULOCYTES
Immature Retic Fract: 12.9 % (ref 2.3–15.9)
RBC.: 5.56 MIL/uL — ABNORMAL HIGH (ref 3.87–5.11)
Retic Count, Absolute: 87.3 10*3/uL (ref 19.0–186.0)
Retic Ct Pct: 1.6 % (ref 0.4–3.1)

## 2021-11-08 LAB — IRON AND IRON BINDING CAPACITY (CC-WL,HP ONLY)
Iron: 43 ug/dL (ref 28–170)
Saturation Ratios: 14 % (ref 10.4–31.8)
TIBC: 311 ug/dL (ref 250–450)
UIBC: 268 ug/dL (ref 148–442)

## 2021-11-08 NOTE — Progress Notes (Signed)
?Hematology and Oncology Follow Up Visit ? ?Katrina Walters ?[id number] ?31-Jan-1956 66 y.o. ?11/08/2021 ? ? 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 ?Current Therapy:   ?IV iron as indicated  ?  ?Interim History:  Katrina Walters is here today with her husband for follow-up. She is feeling much better and states that her energy is improved.  ?Hgb is now 12.9! ?No fever, chills, n/v, cough, rash, dizziness, SOB, chest pain, palpitations, abdominal pain or changes in bladder habits.  ?She started taking a vegan dietary fiber supplement and states that her BM's are now regular daily.  ?No blood loss noted. No bruising or petechiae.  ?No swelling, tenderness, numbness or tingling in her extremities.  ?No falls or syncope.  ?She has maintained a good appetite and is staying well hydrated. Her weight is stable at 105 lbs.  ? ?ECOG Performance Status: 1 - Symptomatic but completely ambulatory ? ?Medications:  ?Allergies as of 11/08/2021   ? ?   Reactions  ? Amoxicillin   ? Had fever with flu like symptoms. ?Has patient had a PCN reaction causing immediate rash, facial/tongue/throat swelling, SOB or lightheadedness with hypotension: no ?Has patient had a PCN reaction causing severe rash involving mucus membranes or skin necrosis: no ?Has patient had a PCN reaction that required hospitalization no ?Has patient had a PCN reaction occurring within the last 10 years: yes ?If all of the above answers are "NO", then may proceed with Cephalosporin use.  ? Clopidogrel & Aspirin Itching  ? Amlodipine   ? Dizzy, heavy feeling, nauseated, tongue burning  ? Atorvastatin   ? Myalgia  ? Crestor [rosuvastatin] Itching  ? Metoprolol Other (See Comments)  ? ?possibly caused hair loss  ? Pravastatin Itching  ? Repatha [evolocumab]   ? itching  ? ?  ? ?  ?Medication List  ?  ? ?  ? Accurate as of November 08, 2021  1:18 PM. If you have any questions, ask your nurse or doctor.  ?  ?  ? ?   ? ?aspirin 81 MG chewable tablet ?Chew by mouth daily. ?  ?benzonatate 100 MG capsule ?Commonly known as: TESSALON ?Take 1 capsule (100 mg total) by mouth 3 (three) times daily as needed for cough. ?  ?colchicine 0.6 MG tablet ?Take 1 tablet (0.6 mg total) by mouth 2 (two) times daily. ?  ?Cranberry-Vitamin C 250-60 MG Caps ?Take 2 tablets by mouth daily. ?  ?D-MANNOSE PO ?Take 2 tablets by mouth daily. ?  ?diphenhydrAMINE 25 MG tablet ?Commonly known as: BENADRYL ?Take 25 mg by mouth at bedtime. ?  ?ezetimibe 10 MG tablet ?Commonly known as: ZETIA ?Take 1 tablet (10 mg total) by mouth daily. ?  ?ferrous sulfate 325 (65 FE) MG tablet ?TAKE 325 MG BY MOUTH DAILY. ?  ?FIBER PO ?Take 2 tablets by mouth daily. ?  ?fluticasone 50 MCG/ACT nasal spray ?Commonly known as: FLONASE ?SPRAY 2 SPRAYS INTO EACH NOSTRIL EVERY DAY ?  ?levofloxacin 500 MG tablet ?Commonly known as: LEVAQUIN ?1 tab po x 3 days ?  ?linaclotide 290 MCG Caps capsule ?Commonly known as: LINZESS ?Take 1 capsule (290 mcg total) by mouth daily before breakfast. ?  ?nitrofurantoin (macrocrystal-monohydrate) 100 MG capsule ?Commonly known as: MACROBID ?Take 1 capsule (100 mg total) by mouth 2 (two) times daily. ?  ?NON FORMULARY ?Take 0.5 tablets by mouth at bedtime. CBD oil - for pain ?Gummies ?  ?pantoprazole 40 MG tablet ?Commonly known  as: PROTONIX ?TAKE 1 TABLET BY MOUTH EVERY DAY ?  ?Premarin vaginal cream ?Generic drug: conjugated estrogens ?Place 6.22 Applicatorfuls vaginally See admin instructions. Insert 0.5 grams vaginally every other night ?  ?rosuvastatin 10 MG tablet ?Commonly known as: CRESTOR ?Take 1 tablet (10 mg total) by mouth every other day. ?  ?SYSTANE OP ?Place 1 drop into both eyes in the morning and at bedtime. ?  ? ?  ? ? ?Allergies:  ?Allergies  ?Allergen Reactions  ? Amoxicillin   ?  Had fever with flu like symptoms. ?Has patient had a PCN reaction causing immediate rash, facial/tongue/throat swelling, SOB or lightheadedness with  hypotension: no ?Has patient had a PCN reaction causing severe rash involving mucus membranes or skin necrosis: no ?Has patient had a PCN reaction that required hospitalization no ?Has patient had a PCN reaction occurring within the last 10 years: yes ?If all of the above answers are "NO", then may proceed with Cephalosporin use.  ? Clopidogrel & Aspirin Itching  ? Amlodipine   ?  Dizzy, heavy feeling, nauseated, tongue burning  ? Atorvastatin   ?  Myalgia  ? Crestor [Rosuvastatin] Itching  ? Metoprolol Other (See Comments)  ?  ?possibly caused hair loss  ? Pravastatin Itching  ? Repatha [Evolocumab]   ?  itching  ? ? ?Past Medical History, Surgical history, Social history, and Family History were reviewed and updated. ? ?Review of Systems: ?All other 10 point review of systems is negative.  ? ?Physical Exam: ? vitals were not taken for this visit.  ? ?Wt Readings from Last 3 Encounters:  ?10/23/21 102 lb (46.3 kg)  ?10/15/21 104 lb 12.8 oz (47.5 kg)  ?09/26/21 103 lb 12.8 oz (47.1 kg)  ? ? ?Ocular: Sclerae unicteric, pupils equal, round and reactive to light ?Ear-nose-throat: Oropharynx clear, dentition fair ?Lymphatic: No cervical or supraclavicular adenopathy ?Lungs no rales or rhonchi, good excursion bilaterally ?Heart regular rate and rhythm, no murmur appreciated ?Abd soft, nontender, positive bowel sounds ?MSK no focal spinal tenderness, no joint edema ?Neuro: non-focal, well-oriented, appropriate affect ?Breasts: Deferred  ? ?Lab Results  ?Component Value Date  ? WBC 8.2 11/08/2021  ? HGB 12.9 11/08/2021  ? HCT 43.5 11/08/2021  ? MCV 77.4 (L) 11/08/2021  ? PLT 406 (H) 11/08/2021  ? ?Lab Results  ?Component Value Date  ? FERRITIN 4 (L) 09/26/2021  ? IRON 26 (L) 09/26/2021  ? TIBC 442 09/26/2021  ? UIBC 416 09/26/2021  ? IRONPCTSAT 6 (L) 09/26/2021  ? ?Lab Results  ?Component Value Date  ? RETICCTPCT 1.6 11/08/2021  ? RBC 5.56 (H) 11/08/2021  ? ?No results found for: KPAFRELGTCHN, LAMBDASER, KAPLAMBRATIO ?No  results found for: IGGSERUM, IGA, IGMSERUM ?No results found for: TOTALPROTELP, ALBUMINELP, A1GS, A2GS, BETS, BETA2SER, GAMS, MSPIKE, SPEI ?  Chemistry   ?   ?Component Value Date/Time  ? NA 138 09/26/2021 1005  ? NA 139 04/15/2021 1027  ? K 4.4 09/26/2021 1005  ? CL 104 09/26/2021 1005  ? CO2 26 09/26/2021 1005  ? BUN 13 09/26/2021 1005  ? BUN 14 04/15/2021 1027  ? CREATININE 0.79 09/26/2021 1005  ? CREATININE 0.84 07/14/2016 0914  ?    ?Component Value Date/Time  ? CALCIUM 9.6 09/26/2021 1005  ? ALKPHOS 55 09/26/2021 1005  ? AST 15 09/26/2021 1005  ? ALT 10 09/26/2021 1005  ? BILITOT 0.6 09/26/2021 1005  ?  ? ? ? ?Impression and Plan: Ms. Frith is a very pleasant 66 yo caucasian female with  iron deficiency anemia.  ?Iron studies are pending.  ?Follow-up in 3 months.  ? ?Lottie Dawson, NP ?3/3/20231:18 PM ? ?

## 2021-11-11 LAB — FERRITIN: Ferritin: 78 ng/mL (ref 11–307)

## 2021-11-14 ENCOUNTER — Telehealth: Payer: Self-pay | Admitting: *Deleted

## 2021-11-14 NOTE — Telephone Encounter (Signed)
Per 11/08/21 los - called and gave upcoming appointments - confirmed ?

## 2021-11-19 ENCOUNTER — Ambulatory Visit: Payer: Medicare Other | Admitting: Medical

## 2021-11-19 VITALS — BP 133/79 | HR 87 | Temp 98.0°F | Resp 18 | Ht 63.0 in | Wt 100.2 lb

## 2021-11-19 DIAGNOSIS — N390 Urinary tract infection, site not specified: Secondary | ICD-10-CM

## 2021-11-19 DIAGNOSIS — E785 Hyperlipidemia, unspecified: Secondary | ICD-10-CM | POA: Diagnosis not present

## 2021-11-19 DIAGNOSIS — M810 Age-related osteoporosis without current pathological fracture: Secondary | ICD-10-CM

## 2021-11-19 DIAGNOSIS — A239 Brucellosis, unspecified: Secondary | ICD-10-CM

## 2021-11-19 DIAGNOSIS — Z951 Presence of aortocoronary bypass graft: Secondary | ICD-10-CM | POA: Diagnosis not present

## 2021-11-19 LAB — COMPREHENSIVE METABOLIC PANEL
ALT: 21 U/L (ref 0–35)
AST: 28 U/L (ref 0–37)
Albumin: 4.6 g/dL (ref 3.5–5.2)
Alkaline Phosphatase: 63 U/L (ref 39–117)
BUN: 10 mg/dL (ref 6–23)
CO2: 30 mEq/L (ref 19–32)
Calcium: 9.6 mg/dL (ref 8.4–10.5)
Chloride: 101 mEq/L (ref 96–112)
Creatinine, Ser: 0.81 mg/dL (ref 0.40–1.20)
GFR: 76.54 mL/min (ref >60.00)
Glucose, Bld: 78 mg/dL (ref 70–99)
Potassium: 5.1 mEq/L (ref 3.5–5.1)
Sodium: 138 mEq/L (ref 135–145)
Total Bilirubin: 0.5 mg/dL (ref 0.2–1.2)
Total Protein: 7.1 g/dL (ref 6.0–8.3)

## 2021-11-19 LAB — LIPID PANEL
Cholesterol: 113 mg/dL (ref 0–200)
HDL: 38.1 mg/dL — ABNORMAL LOW (ref >39.00)
LDL Cholesterol: 51 mg/dL (ref 0–99)
NonHDL: 74.82
Total CHOL/HDL Ratio: 3
Triglycerides: 117 mg/dL (ref 0.0–149.0)
VLDL: 23.4 mg/dL (ref 0.0–40.0)

## 2021-11-19 LAB — C-REACTIVE PROTEIN: CRP: <1 mg/dL (ref 0.5–20.0)

## 2021-11-19 LAB — SEDIMENTATION RATE: Sed Rate: 5 mm/hr (ref 0–30)

## 2021-11-19 MED ORDER — COLCHICINE 0.6 MG PO TABS: 0.6000 mg | ORAL_TABLET | Freq: Two times a day (BID) | ORAL | 3 refills | Status: DC

## 2021-11-19 NOTE — Patient Instructions (Addendum)
Mediterranean dx since youth. 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 Refilling rx today. On review today and discussion will refer you to rheumatologist as well. ? ? ?Hx of cabg and high cholesterol will get lipid panel with cmp today. Continue crestor and zetia. Rx advisement given. ? ?For chronic uti and prevention continue macrobid. ? ?For osteoporosis continue with prolia.  ? ?Follow up date to be determined after lab review. ? ? ? ? ?

## 2021-11-19 NOTE — Progress Notes (Signed)
Subjective:    Patient ID: Katrina Walters, female    DOB: 01-Feb-1956, 66 y.o.   MRN: [id number]  HPI Pt in for follow up.  Pt needs cholesterol levels checked today.she is fasting. Pt taking crestor daily presently and zetia.   Pt has uti after intercourse. Gyn gave pt macrobid for prevention. Pt using macrobid bid for 6 months. Synptoms improved/less.  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 Pt was seen by infectoius disease MD in the past. This was when insurnace changed and she changed providers. They were uncomfortable prescribing. When pt tried not to use 2 years ago got fever, chills., leg pains and abdomen pain. Has been on med since 66 yo. Dx by specialist as youth      Review of Systems  Constitutional:  Negative for chills, diaphoresis and fatigue.  HENT:  Negative for congestion and drooling.   Respiratory:  Negative for cough, chest tightness, shortness of breath and wheezing.   Cardiovascular:  Negative for chest pain and palpitations.  Gastrointestinal:  Negative for abdominal pain, blood in stool, constipation and nausea.  Genitourinary:  Negative for difficulty urinating, dysuria, flank pain, frequency, genital sores, hematuria and pelvic pain.  Musculoskeletal:  Negative for back pain, gait problem and joint swelling.  Neurological:  Negative for dizziness, light-headedness and headaches.  Psychiatric/Behavioral:  Negative for behavioral problems and confusion.    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 77 years in [place], managed on colchicine   PONV (postoperative nausea and vomiting)      Social History   Socioeconomic History   Marital status: Married    Spouse name: Not on file   Number of children: Not on file   Years of education: Not on file   Highest education level: Not on file  Occupational  History   Occupation: unemployed at the time  Tobacco Use   Smoking status: Never   Smokeless tobacco: Never  Vaping Use   Vaping Use: Never used  Substance and Sexual Activity   Alcohol use: Not Currently    Alcohol/week: 0.0 standard drinks    Comment: occassionally   Drug use: No    Comment: CBD gummies   Sexual activity: Yes  Other Topics Concern   Not on file  Social History Narrative   Not on file   Social Determinants of Health   Financial Resource Strain: Not on file  Food Insecurity: Not on file  Transportation Needs: Not on file  Physical Activity: Not on file  Stress: Not on file  Social Connections: Not on file  Intimate Partner Violence: Not on file    Past Surgical History:  Procedure Laterality Date   ABDOMINAL HYSTERECTOMY  08/2019   ANAL RECTAL MANOMETRY N/A 11/12/2020   Procedure: ANO RECTAL MANOMETRY;  Surgeon: Shellia Cleverly, DO;  Location: [facility];  Service: Gastroenterology;  Laterality: N/A;   BLADDER SURGERY  08/2019   BREAST REDUCTION SURGERY  2006   CARDIAC CATHETERIZATION N/A 07/17/2016   Procedure: Left Heart Cath and Coronary Angiography;  Surgeon: Kathleene Hazel, MD;  Location: [facility] INVASIVE CV LAB;  Service: Cardiovascular;  Laterality: N/A;   CATARACT EXTRACTION W/ INTRAOCULAR LENS IMPLANT Bilateral    COLONOSCOPY  2015   CORONARY ARTERY BYPASS GRAFT N/A 06/04/2021   Procedure: CORONARY ARTERY  BYPASS GRAFTING (CABG), ON PUMP, TIMES FOUR, USING LEFT INTERNAL MAMMARY ARTERY, LEFT RADIAL ARTERY (HARVESTED OPEN), AND RIGHT GREATER SAPHENOUS VEIN (HARVESTED ENDOSCOPICALLY);  Surgeon: Corliss Skains, MD;  Location: [facility] OR;  Service: Open Heart Surgery;  Laterality: N/A;   EYE SURGERY     LEFT HEART CATH AND CORONARY ANGIOGRAPHY N/A 04/22/2021   Procedure: LEFT HEART CATH AND CORONARY ANGIOGRAPHY;  Surgeon: Lyn Records, MD;  Location: [facility] INVASIVE CV LAB;  Service: Cardiovascular;  Laterality: N/A;   POLYPECTOMY  2015    RADIAL ARTERY HARVEST Left 06/04/2021   Procedure: RADIAL ARTERY HARVEST;  Surgeon: Corliss Skains, MD;  Location: [facility] OR;  Service: Open Heart Surgery;  Laterality: Left;   TEE WITHOUT CARDIOVERSION N/A 06/04/2021   Procedure: TRANSESOPHAGEAL ECHOCARDIOGRAM (TEE);  Surgeon: Corliss Skains, MD;  Location: [facility] OR;  Service: Open Heart Surgery;  Laterality: N/A;   TONSILLECTOMY      Family History  Problem Relation Age of Onset   Heart disease Mother        died at 39 of heart attack   Hypertension Mother    Heart attack Mother    Heart disease Father        had heart attack at age 36   Heart attack Father    Rectal cancer Father 53   Cancer Paternal Aunt    Stomach cancer Paternal Grandmother    Colon cancer Neg Hx    Colon polyps Neg Hx    Esophageal cancer Neg Hx     Allergies  Allergen Reactions   Amoxicillin     Had fever with flu like symptoms. Has patient had a PCN reaction causing immediate rash, facial/tongue/throat swelling, SOB or lightheadedness with hypotension: no Has patient had a PCN reaction causing severe rash involving mucus membranes or skin necrosis: no Has patient had a PCN reaction that required hospitalization no Has patient had a PCN reaction occurring within the last 10 years: yes If all of the above answers are "NO", then may proceed with Cephalosporin use.   Clopidogrel & Aspirin Itching   Amlodipine     Dizzy, heavy feeling, nauseated, tongue burning   Atorvastatin     Myalgia   Crestor [Rosuvastatin] Itching   Metoprolol Other (See Comments)    ?possibly caused hair loss   Pravastatin Itching   Repatha [Evolocumab]     itching    Current Outpatient Medications on File Prior to Visit  Medication Sig Dispense Refill   aspirin 81 MG chewable tablet Chew by mouth daily.     colchicine 0.6 MG tablet Take 1 tablet (0.6 mg total) by mouth 2 (two) times daily. 180 tablet 3   Cranberry-Vitamin C 250-60 MG CAPS Take 2 tablets by mouth daily.      D-MANNOSE PO Take 2 tablets by mouth daily.     diphenhydrAMINE (BENADRYL) 25 MG tablet Take 25 mg by mouth at bedtime.     FIBER PO Take 2 tablets by mouth daily.     fluticasone (FLONASE) 50 MCG/ACT nasal spray SPRAY 2 SPRAYS INTO EACH NOSTRIL EVERY DAY 48 mL 1   levofloxacin (LEVAQUIN) 500 MG tablet 1 tab po x 3 days 3 tablet 0   linaclotide (LINZESS) 290 MCG CAPS capsule Take 1 capsule (290 mcg total) by mouth daily before breakfast. 90 capsule 5   nitrofurantoin, macrocrystal-monohydrate, (MACROBID) 100 MG capsule Take 1 capsule (100 mg total) by mouth 2 (two) times daily. 90 capsule 3   NON FORMULARY  Take 0.5 tablets by mouth at bedtime. CBD oil - for pain Gummies     pantoprazole (PROTONIX) 40 MG tablet TAKE 1 TABLET BY MOUTH EVERY DAY 90 tablet 1   Polyethyl Glycol-Propyl Glycol (SYSTANE OP) Place 1 drop into both eyes in the morning and at bedtime.     PREMARIN vaginal cream Place 0.25 Applicatorfuls vaginally See admin instructions. Insert 0.5 grams vaginally every other night 30 g 6   rosuvastatin (CRESTOR) 10 MG tablet Take 1 tablet (10 mg total) by mouth every other day. 90 tablet 1   ezetimibe (ZETIA) 10 MG tablet Take 1 tablet (10 mg total) by mouth daily. 90 tablet 3   ferrous sulfate 325 (65 FE) MG tablet TAKE 325 MG BY MOUTH DAILY. 90 tablet 1   No current facility-administered medications on file prior to visit.    BP 133/79   Pulse 87   Temp 98 F (36.7 C)   Resp 18   Ht 5\' 3"  (1.6 m)   Wt 100 lb 3.2 oz (45.5 kg)   LMP  (LMP Unknown)   SpO2 99%   BMI 17.75 kg/m        Objective:   Physical Exam  General Mental Status- Alert. General Appearance- Not in acute distress.   Skin General: Color- Normal Color. Moisture- Normal Moisture.  Neck Carotid Arteries- Normal color. Moisture- Normal Moisture. No carotid bruits. No JVD.  Chest and Lung Exam Auscultation: Breath Sounds:-Normal.  Cardiovascular Auscultation:Rythm- Regular. Murmurs & Other  Heart Sounds:Auscultation of the heart reveals- No Murmurs.  Abdomen Inspection:-Inspeection Normal. Palpation/Percussion:Note:No mass. Palpation and Percussion of the abdomen reveal- Non Tender, Non Distended + BS, no rebound or guarding.    Neurologic Cranial Nerve exam:- CN III-XII intact(No nystagmus), symmetric smile. Drift Test:- No drift. Romberg Exam:- Negative.  Heal to Toe Gait exam:-Normal. Finger to Nose:- Normal/Intact Strength:- 5/5 equal and symmetric strength both upper and lower extremities.       Assessment & Plan:   Patient Instructions  Mediterranean dx since youth. 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 Refilling rx today. On review today and discussion will refer you to rheumatologist as well.   Hx of cabg and high cholesterol will get lipid panel with cmp today. Continue crestor and zetia. Rx advisement given.  For chronic uti and prevention continue macrobid.  For osteoporosis continue with prolia.   Follow up date to be determined after lab review.     Esperanza Richters, PA-C   Time spent with patient today was  33 minutes which consisted of chart review, discussing diagnosis, work up, treatment , answering question and documentation.

## 2021-11-20 ENCOUNTER — Telehealth: Payer: Self-pay | Admitting: Medical

## 2021-11-20 DIAGNOSIS — A239 Brucellosis, unspecified: Secondary | ICD-10-CM

## 2021-11-20 NOTE — Telephone Encounter (Signed)
Chart opened to rx med, order lab, review chart, respond to my chart message or send message to staff member  

## 2021-11-20 NOTE — Telephone Encounter (Signed)
I referred pt to rheumatology. Dr. Estanislado Pandy declined. Can you try Dr. Benjamine Mola or other practice external to [facility] if necessary.  ? ?Thanks. ?

## 2021-11-28 ENCOUNTER — Other Ambulatory Visit: Payer: Self-pay | Admitting: Medical

## 2021-12-18 ENCOUNTER — Encounter: Payer: Self-pay | Admitting: Cardiovascular Disease

## 2021-12-18 MED ORDER — ROSUVASTATIN CALCIUM 10 MG PO TABS
10.0000 mg | ORAL_TABLET | Freq: Every day | ORAL | 3 refills | Status: DC
Start: 2021-12-18 — End: 2022-10-31

## 2021-12-18 NOTE — Telephone Encounter (Signed)
Reviewing Dr Audie Box office visit on 10/15/21.  ?  ?LDL cholesterol back down.  64.  She is on Crestor 10 mg every other day.  Also on Zetia 10 mg daily.  She seems to be tolerating this regimen well.  She did not qualify for Leqvio.  She is intolerant of Repatha.  She is okay to try taking Crestor daily ? ? ? New order placed  Rosuvastatin 10 mg  daily  #90  with 3 refills  ?

## 2021-12-19 ENCOUNTER — Telehealth: Payer: Self-pay | Admitting: Medical

## 2021-12-19 NOTE — Telephone Encounter (Signed)
error 

## 2021-12-24 ENCOUNTER — Ambulatory Visit (INDEPENDENT_AMBULATORY_CARE_PROVIDER_SITE_OTHER): Payer: Medicare Other

## 2021-12-24 VITALS — BP 118/80 | Ht 63.0 in | Wt 102.0 lb

## 2021-12-24 DIAGNOSIS — Z0000 Encounter for general adult medical examination without abnormal findings: Secondary | ICD-10-CM | POA: Diagnosis not present

## 2021-12-24 NOTE — Patient Instructions (Addendum)
?Katrina Walters , ?Thank you for taking time to come for your Medicare Wellness Visit. I appreciate your ongoing commitment to your health goals. Please review the following plan we discussed and let me know if I can assist you in the future.  ? ?These are the goals we discussed: ? Goals   ? ?   Increase physical activity (pt-stated)   ?   Keep a healthy mind ?  ? ?  ?  ?This is a list of the screening recommended for you and due dates:  ?Health Maintenance  ?Topic Date Due  ? Hepatitis C Screening: USPSTF Recommendation to screen - Ages 19-79 yo.  Never done  ? Pap Smear  10/09/2018  ? COVID-19 Vaccine (2 - Pfizer series) 01/09/2022*  ? Flu Shot  04/08/2022  ? Mammogram  10/25/2023  ? Colon Cancer Screening  03/31/2027  ? Tetanus Vaccine  06/08/2030  ? HIV Screening  Completed  ? Zoster (Shingles) Vaccine  Completed  ? HPV Vaccine  Aged Out  ?*Topic was postponed. The date shown is not the original due date.  ?  ?Advanced directives: No ? ?Conditions/risks identified: None ? ?Next appointment: Follow up in one year for your annual wellness visit  ? ? ? ?Preventive Care 80 Years and Older, Female ?Preventive care refers to lifestyle choices and visits with your health care provider that can promote health and wellness. ?What does preventive care include? ?A yearly physical exam. This is also called an annual well check. ?Dental exams once or twice a year. ?Routine eye exams. Ask your health care provider how often you should have your eyes checked. ?Personal lifestyle choices, including: ?Daily care of your teeth and gums. ?Regular physical activity. ?Eating a healthy diet. ?Avoiding tobacco and drug use. ?Limiting alcohol use. ?Practicing safe sex. ?Taking low-dose aspirin every day. ?Taking vitamin and mineral supplements as recommended by your health care provider. ?What happens during an annual well check? ?The services and screenings done by your health care provider during your annual well check will depend on  your age, overall health, lifestyle risk factors, and family history of disease. ?Counseling  ?Your health care provider may ask you questions about your: ?Alcohol use. ?Tobacco use. ?Drug use. ?Emotional well-being. ?Home and relationship well-being. ?Sexual activity. ?Eating habits. ?History of falls. ?Memory and ability to understand (cognition). ?Work and work Statistician. ?Reproductive health. ?Screening  ?You may have the following tests or measurements: ?Height, weight, and BMI. ?Blood pressure. ?Lipid and cholesterol levels. These may be checked every 5 years, or more frequently if you are over 19 years old. ?Skin check. ?Lung cancer screening. You may have this screening every year starting at age 21 if you have a 30-pack-year history of smoking and currently smoke or have quit within the past 15 years. ?Fecal occult blood test (FOBT) of the stool. You may have this test every year starting at age 78. ?Flexible sigmoidoscopy or colonoscopy. You may have a sigmoidoscopy every 5 years or a colonoscopy every 10 years starting at age 72. ?Hepatitis C blood test. ?Hepatitis B blood test. ?Sexually transmitted disease (STD) testing. ?Diabetes screening. This is done by checking your blood sugar (glucose) after you have not eaten for a while (fasting). You may have this done every 1-3 years. ?Bone density scan. This is done to screen for osteoporosis. You may have this done starting at age 72. ?Mammogram. This may be done every 1-2 years. Talk to your health care provider about how often you should have  regular mammograms. ?Talk with your health care provider about your test results, treatment options, and if necessary, the need for more tests. ?Vaccines  ?Your health care provider may recommend certain vaccines, such as: ?Influenza vaccine. This is recommended every year. ?Tetanus, diphtheria, and acellular pertussis (Tdap, Td) vaccine. You may need a Td booster every 10 years. ?Zoster vaccine. You may need this  after age 39. ?Pneumococcal 13-valent conjugate (PCV13) vaccine. One dose is recommended after age 54. ?Pneumococcal polysaccharide (PPSV23) vaccine. One dose is recommended after age 57. ?Talk to your health care provider about which screenings and vaccines you need and how often you need them. ?This information is not intended to replace advice given to you by your health care provider. Make sure you discuss any questions you have with your health care provider. ?Document Released: 09/21/2015 Document Revised: 05/14/2016 Document Reviewed: 06/26/2015 ?Elsevier Interactive Patient Education ? 2017 [place]. ? ?Fall Prevention in the Home ?Falls can cause injuries. They can happen to people of all ages. There are many things you can do to make your home safe and to help prevent falls. ?What can I do on the outside of my home? ?Regularly fix the edges of walkways and driveways and fix any cracks. ?Remove anything that might make you trip as you walk through a door, such as a raised step or threshold. ?Trim any bushes or trees on the path to your home. ?Use bright outdoor lighting. ?Clear any walking paths of anything that might make someone trip, such as rocks or tools. ?Regularly check to see if handrails are loose or broken. Make sure that both sides of any steps have handrails. ?Any raised decks and porches should have guardrails on the edges. ?Have any leaves, snow, or ice cleared regularly. ?Use sand or salt on walking paths during winter. ?Clean up any spills in your garage right away. This includes oil or grease spills. ?What can I do in the bathroom? ?Use night lights. ?Install grab bars by the toilet and in the tub and shower. Do not use towel bars as grab bars. ?Use non-skid mats or decals in the tub or shower. ?If you need to sit down in the shower, use a plastic, non-slip stool. ?Keep the floor dry. Clean up any water that spills on the floor as soon as it happens. ?Remove soap buildup in the tub or  shower regularly. ?Attach bath mats securely with double-sided non-slip rug tape. ?Do not have throw rugs and other things on the floor that can make you trip. ?What can I do in the bedroom? ?Use night lights. ?Make sure that you have a light by your bed that is easy to reach. ?Do not use any sheets or blankets that are too big for your bed. They should not hang down onto the floor. ?Have a firm chair that has side arms. You can use this for support while you get dressed. ?Do not have throw rugs and other things on the floor that can make you trip. ?What can I do in the kitchen? ?Clean up any spills right away. ?Avoid walking on wet floors. ?Keep items that you use a lot in easy-to-reach places. ?If you need to reach something above you, use a strong step stool that has a grab bar. ?Keep electrical cords out of the way. ?Do not use floor polish or wax that makes floors slippery. If you must use wax, use non-skid floor wax. ?Do not have throw rugs and other things on the floor that  can make you trip. ?What can I do with my stairs? ?Do not leave any items on the stairs. ?Make sure that there are handrails on both sides of the stairs and use them. Fix handrails that are broken or loose. Make sure that handrails are as long as the stairways. ?Check any carpeting to make sure that it is firmly attached to the stairs. Fix any carpet that is loose or worn. ?Avoid having throw rugs at the top or bottom of the stairs. If you do have throw rugs, attach them to the floor with carpet tape. ?Make sure that you have a light switch at the top of the stairs and the bottom of the stairs. If you do not have them, ask someone to add them for you. ?What else can I do to help prevent falls? ?Wear shoes that: ?Do not have high heels. ?Have rubber bottoms. ?Are comfortable and fit you well. ?Are closed at the toe. Do not wear sandals. ?If you use a stepladder: ?Make sure that it is fully opened. Do not climb a closed stepladder. ?Make  sure that both sides of the stepladder are locked into place. ?Ask someone to hold it for you, if possible. ?Clearly mark and make sure that you can see: ?Any grab bars or handrails. ?First and last steps. ?

## 2021-12-24 NOTE — Progress Notes (Addendum)
? ?Subjective:  ? Katrina Walters is a 66 y.o. female who presents for Medicare Annual (Subsequent) preventive examination. ? ?Review of Systems    ?Virtual Visit via Telephone Note ? ?I connected with  Katrina Walters on 12/24/21 at  2:00 PM EDT by telephone and verified that I am speaking with the correct person using two identifiers. ? ?Location: ?Patient: Home ?Provider: Office ?Persons participating in the virtual visit: patient/Nurse Health Advisor ?  ?I discussed the limitations, risks, security and privacy concerns of performing an evaluation and management service by telephone and the availability of in person appointments. The patient expressed understanding and agreed to proceed. ? ?Interactive audio and video telecommunications were attempted between this nurse and patient, however failed, due to patient having technical difficulties OR patient did not have access to video capability.  We continued and completed visit with audio only. ? ?Some vital signs may be absent or patient reported.  ? ?Criselda Peaches, LPN  ?Cardiac Risk Factors include: advanced age (>20mn, >>62women);Other (see comment), Risk factor comments: CAD ? ?   ?Objective:  ?  ?Today's Vitals  ? 12/24/21 1415  ?BP: 118/80  ?Weight: 102 lb (46.3 kg)  ?Height: '5\' 3"'$  (1.6 m)  ? ?Body mass index is 18.07 kg/m?. ? ? ?  12/24/2021  ?  2:36 PM 11/08/2021  ?  1:16 PM 09/26/2021  ? 10:26 AM 06/05/2021  ?  7:00 AM 05/31/2021  ? 11:12 AM 04/22/2021  ?  5:37 AM 01/24/2021  ?  2:19 PM  ?Advanced Directives  ?Does Patient Have a Medical Advance Directive? No No No No No No No  ?Does patient want to make changes to medical advance directive?      No - Patient declined   ?Would patient like information on creating a medical advance directive? No - Patient declined No - Patient declined No - Patient declined No - Patient declined No - Patient declined No - Patient declined No - Patient declined  ? ? ?Current Medications (verified) ?Outpatient Encounter Medications as  of 12/24/2021  ?Medication Sig  ? aspirin 81 MG chewable tablet Chew by mouth daily.  ? colchicine 0.6 MG tablet TAKE 1 TABLET BY MOUTH TWICE A DAY  ? Cranberry-Vitamin C 250-60 MG CAPS Take 2 tablets by mouth daily.  ? D-MANNOSE PO Take 2 tablets by mouth daily.  ? diphenhydrAMINE (BENADRYL) 25 MG tablet Take 25 mg by mouth at bedtime.  ? ezetimibe (ZETIA) 10 MG tablet Take 1 tablet (10 mg total) by mouth daily.  ? ferrous sulfate 325 (65 FE) MG tablet TAKE 325 MG BY MOUTH DAILY.  ? FIBER PO Take 2 tablets by mouth daily.  ? fluticasone (FLONASE) 50 MCG/ACT nasal spray SPRAY 2 SPRAYS INTO EACH NOSTRIL EVERY DAY  ? levofloxacin (LEVAQUIN) 500 MG tablet 1 tab po x 3 days  ? linaclotide (LINZESS) 290 MCG CAPS capsule Take 1 capsule (290 mcg total) by mouth daily before breakfast.  ? nitrofurantoin, macrocrystal-monohydrate, (MACROBID) 100 MG capsule Take 1 capsule (100 mg total) by mouth 2 (two) times daily.  ? NON FORMULARY Take 0.5 tablets by mouth at bedtime. CBD oil - for pain ?Gummies  ? pantoprazole (PROTONIX) 40 MG tablet TAKE 1 TABLET BY MOUTH EVERY DAY  ? Polyethyl Glycol-Propyl Glycol (SYSTANE OP) Place 1 drop into both eyes in the morning and at bedtime.  ? PREMARIN vaginal cream Place 00.16Applicatorfuls vaginally See admin instructions. Insert 0.5 grams vaginally every other night  ? rosuvastatin (CRESTOR)  10 MG tablet Take 1 tablet (10 mg total) by mouth daily.  ? ?No facility-administered encounter medications on file as of 12/24/2021.  ? ? ?Allergies (verified) ?Amoxicillin, Clopidogrel & aspirin, Amlodipine, Atorvastatin, Crestor [rosuvastatin], Metoprolol, Pravastatin, and Repatha [evolocumab]  ? ?History: ?Past Medical History:  ?Diagnosis Date  ? Complication of anesthesia   ? Coronary artery disease   ? Eating disorder   ? Genital warts   ? Hyperlipidemia   ? Mediterranean fever   ? diagnosed ~ age 18 years in [place], managed on colchicine  ? PONV (postoperative nausea and vomiting)   ? ?Past  Surgical History:  ?Procedure Laterality Date  ? ABDOMINAL HYSTERECTOMY  08/2019  ? ANAL RECTAL MANOMETRY N/A 11/12/2020  ? Procedure: ANO RECTAL MANOMETRY;  Surgeon: Lavena Bullion, DO;  Location: [facility];  Service: Gastroenterology;  Laterality: N/A;  ? BLADDER SURGERY  08/2019  ? BREAST REDUCTION SURGERY  2006  ? CARDIAC CATHETERIZATION N/A 07/17/2016  ? Procedure: Left Heart Cath and Coronary Angiography;  Surgeon: Burnell Blanks, MD;  Location: [facility];  Service: Cardiovascular;  Laterality: N/A;  ? CATARACT EXTRACTION W/ INTRAOCULAR LENS IMPLANT Bilateral   ? COLONOSCOPY  2015  ? CORONARY ARTERY BYPASS GRAFT N/A 06/04/2021  ? Procedure: CORONARY ARTERY BYPASS GRAFTING (CABG), ON PUMP, TIMES FOUR, USING LEFT INTERNAL MAMMARY ARTERY, LEFT RADIAL ARTERY (HARVESTED OPEN), AND RIGHT GREATER SAPHENOUS VEIN (HARVESTED ENDOSCOPICALLY);  Surgeon: Lajuana Matte, MD;  Location: [facility];  Service: Open Heart Surgery;  Laterality: N/A;  ? EYE SURGERY    ? LEFT HEART CATH AND CORONARY ANGIOGRAPHY N/A 04/22/2021  ? Procedure: LEFT HEART CATH AND CORONARY ANGIOGRAPHY;  Surgeon: Belva Crome, MD;  Location: [facility];  Service: Cardiovascular;  Laterality: N/A;  ? POLYPECTOMY  2015  ? RADIAL ARTERY HARVEST Left 06/04/2021  ? Procedure: RADIAL ARTERY HARVEST;  Surgeon: Lajuana Matte, MD;  Location: [facility];  Service: Open Heart Surgery;  Laterality: Left;  ? TEE WITHOUT CARDIOVERSION N/A 06/04/2021  ? Procedure: TRANSESOPHAGEAL ECHOCARDIOGRAM (TEE);  Surgeon: Lajuana Matte, MD;  Location: [facility];  Service: Open Heart Surgery;  Laterality: N/A;  ? TONSILLECTOMY    ? ?Family History  ?Problem Relation Age of Onset  ? Heart disease Mother   ?     died at 52 of heart attack  ? Hypertension Mother   ? Heart attack Mother   ? Heart disease Father   ?     had heart attack at age 64  ? Heart attack Father   ? Rectal cancer Father 48  ? Cancer Paternal Aunt   ? Stomach cancer Paternal  Grandmother   ? Colon cancer Neg Hx   ? Colon polyps Neg Hx   ? Esophageal cancer Neg Hx   ? ?Social History  ? ?Socioeconomic History  ? Marital status: Married  ?  Spouse name: Not on file  ? Number of children: Not on file  ? Years of education: Not on file  ? Highest education level: Not on file  ?Occupational History  ? Occupation: unemployed at the time  ?Tobacco Use  ? Smoking status: Never  ? Smokeless tobacco: Never  ?Vaping Use  ? Vaping Use: Never used  ?Substance and Sexual Activity  ? Alcohol use: Not Currently  ?  Alcohol/week: 0.0 standard drinks  ?  Comment: occassionally  ? Drug use: No  ?  Comment: CBD gummies  ? Sexual activity: Yes  ?Other Topics  Concern  ? Not on file  ?Social History Narrative  ? Not on file  ? ?Social Determinants of Health  ? ?Financial Resource Strain: Medium Risk  ? Difficulty of Paying Living Expenses: Somewhat hard  ?Food Insecurity: Food Insecurity Present  ? Worried About Charity fundraiser in the Last Year: Sometimes true  ? Ran Out of Food in the Last Year: Never true  ?Transportation Needs: No Transportation Needs  ? Lack of Transportation (Medical): No  ? Lack of Transportation (Non-Medical): No  ?Physical Activity: Insufficiently Active  ? Days of Exercise per Week: 3 days  ? Minutes of Exercise per Session: 30 min  ?Stress: Stress Concern Present  ? Feeling of Stress : To some extent  ?Social Connections: Moderately Isolated  ? Frequency of Communication with Friends and Family: More than three times a week  ? Frequency of Social Gatherings with Friends and Family: Once a week  ? Attends Religious Services: Never  ? Active Member of Clubs or Organizations: No  ? Attends Archivist Meetings: Never  ? Marital Status: Married  ? ? ?Tobacco Counseling ?Counseling given: Not Answered ? ? ?Clinical Intake: ? ?Pre-visit preparation completed: Yes ? ?Pain : No/denies pain ? ?  ? ?BMI - recorded: 17.75 ?Nutritional Status: BMI <19  Underweight ?Nutritional  Risks: None ?Diabetes: No ? ?How often do you need to have someone help you when you read instructions, pamphlets, or other written materials from your doctor or pharmacy?: 1 - Never ? ?Diabetic?  No ? ? ?Marya Fossa

## 2022-02-08 NOTE — Telephone Encounter (Addendum)
Prolia VOB initiated via [email or web address]  Last OV:  Next OV:  Last Prolia inj 09/18/21 Next Prolia inj due 03/19/22

## 2022-02-10 ENCOUNTER — Other Ambulatory Visit: Payer: Self-pay

## 2022-02-10 ENCOUNTER — Inpatient Hospital Stay: Payer: Medicare Other | Admitting: Family

## 2022-02-10 ENCOUNTER — Encounter: Payer: Self-pay | Admitting: Family

## 2022-02-10 ENCOUNTER — Inpatient Hospital Stay: Payer: Medicare Other | Attending: Family

## 2022-02-10 VITALS — BP 125/64 | HR 77 | Temp 98.1°F | Resp 18 | Ht 63.0 in | Wt 103.1 lb

## 2022-02-10 DIAGNOSIS — D509 Iron deficiency anemia, unspecified: Secondary | ICD-10-CM | POA: Diagnosis not present

## 2022-02-10 DIAGNOSIS — D500 Iron deficiency anemia secondary to blood loss (chronic): Secondary | ICD-10-CM | POA: Diagnosis present

## 2022-02-10 DIAGNOSIS — K648 Other hemorrhoids: Secondary | ICD-10-CM | POA: Insufficient documentation

## 2022-02-10 DIAGNOSIS — Z87440 Personal history of urinary (tract) infections: Secondary | ICD-10-CM | POA: Insufficient documentation

## 2022-02-10 DIAGNOSIS — K922 Gastrointestinal hemorrhage, unspecified: Secondary | ICD-10-CM | POA: Insufficient documentation

## 2022-02-10 LAB — CBC WITH DIFFERENTIAL (CANCER CENTER ONLY)
Abs Immature Granulocytes: 0.08 10*3/uL — ABNORMAL HIGH (ref 0.00–0.07)
Basophils Absolute: 0.1 10*3/uL (ref 0.0–0.1)
Basophils Relative: 1 %
Eosinophils Absolute: 0.4 10*3/uL (ref 0.0–0.5)
Eosinophils Relative: 4 %
HCT: 54.1 % — ABNORMAL HIGH (ref 36.0–46.0)
Hemoglobin: 16.4 g/dL — ABNORMAL HIGH (ref 12.0–15.0)
Immature Granulocytes: 1 %
Lymphocytes Relative: 17 %
Lymphs Abs: 1.9 10*3/uL (ref 0.7–4.0)
MCH: 23 pg — ABNORMAL LOW (ref 26.0–34.0)
MCHC: 30.3 g/dL (ref 30.0–36.0)
MCV: 75.8 fL — ABNORMAL LOW (ref 80.0–100.0)
Monocytes Absolute: 0.7 10*3/uL (ref 0.1–1.0)
Monocytes Relative: 6 %
Neutro Abs: 8 10*3/uL — ABNORMAL HIGH (ref 1.7–7.7)
Neutrophils Relative %: 71 %
Platelet Count: 440 10*3/uL — ABNORMAL HIGH (ref 150–400)
RBC: 7.14 MIL/uL — ABNORMAL HIGH (ref 3.87–5.11)
RDW: 20.4 % — ABNORMAL HIGH (ref 11.5–15.5)
WBC Count: 11.2 10*3/uL — ABNORMAL HIGH (ref 4.0–10.5)
nRBC: 0 % (ref 0.0–0.2)

## 2022-02-10 LAB — FERRITIN: Ferritin: 11 ng/mL (ref 11–307)

## 2022-02-10 LAB — IRON AND IRON BINDING CAPACITY (CC-WL,HP ONLY)
Iron: 44 ug/dL (ref 28–170)
Saturation Ratios: 10 % — ABNORMAL LOW (ref 10.4–31.8)
TIBC: 435 ug/dL (ref 250–450)
UIBC: 391 ug/dL (ref 148–442)

## 2022-02-10 LAB — RETICULOCYTES
Immature Retic Fract: 12.8 % (ref 2.3–15.9)
RBC.: 7.13 MIL/uL — ABNORMAL HIGH (ref 3.87–5.11)
Retic Count, Absolute: 97 10*3/uL (ref 19.0–186.0)
Retic Ct Pct: 1.4 % (ref 0.4–3.1)

## 2022-02-10 NOTE — Progress Notes (Signed)
Hematology and Oncology Follow Up Visit  Katrina Walters [id number] 06/03/1956 66 y.o. 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 She is doing well but is currently half way through a 6 month course of Macrobid for chronic UTI. WBC count is slightly elevated at 11.2.  No fever, chills, n/v, cough, rash, dizziness, changes in or loss of vision, SOB, chest pain, palpitations, abdominal pain or changes in bowel or bladder habits.  Hgb 16.4, MCV 75, platelets 440. She is not taking any oral iron.  No obvious blood loss noted. No bruising or petechiae.  No swelling, tenderness, numbness or tingling in her extremities at this time.  No falls or syncope reported.  Appetite and hydration are good. Weight is 103 lbs.   ECOG Performance Status: 1 - Symptomatic but completely ambulatory  Medications:  Allergies as of 02/10/2022       Reactions   Amoxicillin    Had fever with flu like symptoms. Has patient had a PCN reaction causing immediate rash, facial/tongue/throat swelling, SOB or lightheadedness with hypotension: no Has patient had a PCN reaction causing severe rash involving mucus membranes or skin necrosis: no Has patient had a PCN reaction that required hospitalization no Has patient had a PCN reaction occurring within the last 10 years: yes If all of the above answers are "NO", then may proceed with Cephalosporin use.   Clopidogrel & Aspirin Itching   Amlodipine Nausea Only   Dizzy, heavy feeling, tongue burning   Atorvastatin Other (See Comments)   Myalgia   Crestor [rosuvastatin] Itching   Metoprolol Other (See Comments)   hair loss   Pravastatin Itching   Repatha [evolocumab] Itching        Medication List        Accurate as of February 10, 2022 11:08 AM. If you have any questions, ask your  nurse or doctor.          STOP taking these medications    ferrous sulfate 325 (65 FE) MG tablet Stopped by: Lottie Dawson, NP   levofloxacin 500 MG tablet Commonly known as: LEVAQUIN Stopped by: Lottie Dawson, NP       TAKE these medications    aspirin 81 MG chewable tablet Chew by mouth daily.   colchicine 0.6 MG tablet TAKE 1 TABLET BY MOUTH TWICE A DAY   Cranberry-Vitamin C 250-60 MG Caps Take 2 tablets by mouth daily.   D-MANNOSE PO Take 2 tablets by mouth daily.   diphenhydrAMINE 25 MG tablet Commonly known as: BENADRYL Take 25 mg by mouth at bedtime.   ezetimibe 10 MG tablet Commonly known as: ZETIA Take 1 tablet (10 mg total) by mouth daily.   FIBER PO Take 2 tablets by mouth daily.   fluticasone 50 MCG/ACT nasal spray Commonly known as: FLONASE SPRAY 2 SPRAYS INTO EACH NOSTRIL EVERY DAY   linaclotide 290 MCG Caps capsule Commonly known as: LINZESS Take 1 capsule (290 mcg total) by mouth daily before breakfast.   nitrofurantoin (macrocrystal-monohydrate) 100 MG capsule Commonly known as: MACROBID Take 1 capsule (100 mg total) by mouth 2 (two) times daily.   NON FORMULARY Take 0.5 tablets by mouth at bedtime. CBD oil - for pain Gummies   pantoprazole 40 MG tablet Commonly known as: PROTONIX TAKE 1 TABLET BY  MOUTH EVERY DAY   Premarin vaginal cream Generic drug: conjugated estrogens Place 2.99 Applicatorfuls vaginally See admin instructions. Insert 0.5 grams vaginally every other night   rosuvastatin 10 MG tablet Commonly known as: CRESTOR Take 1 tablet (10 mg total) by mouth daily.   SYSTANE OP Place 1 drop into both eyes in the morning and at bedtime.        Allergies:  Allergies  Allergen Reactions   Amoxicillin     Had fever with flu like symptoms. Has patient had a PCN reaction causing immediate rash, facial/tongue/throat swelling, SOB or lightheadedness with hypotension: no Has patient had a PCN reaction causing severe rash  involving mucus membranes or skin necrosis: no Has patient had a PCN reaction that required hospitalization no Has patient had a PCN reaction occurring within the last 10 years: yes If all of the above answers are "NO", then may proceed with Cephalosporin use.   Clopidogrel & Aspirin Itching   Amlodipine Nausea Only    Dizzy, heavy feeling, tongue burning   Atorvastatin Other (See Comments)    Myalgia   Crestor [Rosuvastatin] Itching   Metoprolol Other (See Comments)    hair loss   Pravastatin Itching   Repatha [Evolocumab] Itching    Past Medical History, Surgical history, Social history, and Family History were reviewed and updated.  Review of Systems: All other 10 point review of systems is negative.   Physical Exam:  height is '5\' 3"'$  (1.6 m) and weight is 103 lb 1.6 oz (46.8 kg). Her oral temperature is 98.1 F (36.7 C). Her blood pressure is 125/64 and her pulse is 77. Her respiration is 18 and oxygen saturation is 100%.   Wt Readings from Last 3 Encounters:  02/10/22 103 lb 1.6 oz (46.8 kg)  12/24/21 102 lb (46.3 kg)  11/19/21 100 lb 3.2 oz (45.5 kg)    Ocular: Sclerae unicteric, pupils equal, round and reactive to light Ear-nose-throat: Oropharynx clear, dentition fair Lymphatic: No cervical or supraclavicular adenopathy Lungs no rales or rhonchi, good excursion bilaterally Heart regular rate and rhythm, no murmur appreciated Abd soft, nontender, positive bowel sounds MSK no focal spinal tenderness, no joint edema Neuro: non-focal, well-oriented, appropriate affect Breasts: Deferred   Lab Results  Component Value Date   WBC 11.2 (H) 02/10/2022   HGB 16.4 (H) 02/10/2022   HCT 54.1 (H) 02/10/2022   MCV 75.8 (L) 02/10/2022   PLT 440 (H) 02/10/2022   Lab Results  Component Value Date   FERRITIN 78 11/08/2021   IRON 43 11/08/2021   TIBC 311 11/08/2021   UIBC 268 11/08/2021   IRONPCTSAT 14 11/08/2021   Lab Results  Component Value Date   RETICCTPCT 1.4  02/10/2022   RBC 7.13 (H) 02/10/2022   No results found for: KPAFRELGTCHN, LAMBDASER, KAPLAMBRATIO No results found for: IGGSERUM, IGA, IGMSERUM No results found for: Odetta Pink, SPEI   Chemistry      Component Value Date/Time   NA 138 11/19/2021 1039   NA 139 04/15/2021 1027   K 5.1 11/19/2021 1039   CL 101 11/19/2021 1039   CO2 30 11/19/2021 1039   BUN 10 11/19/2021 1039   BUN 14 04/15/2021 1027   CREATININE 0.81 11/19/2021 1039   CREATININE 0.79 09/26/2021 1005   CREATININE 0.84 07/14/2016 0914      Component Value Date/Time   CALCIUM 9.6 11/19/2021 1039   ALKPHOS 63 11/19/2021 1039   AST 28 11/19/2021 1039   AST  15 09/26/2021 1005   ALT 21 11/19/2021 1039   ALT 10 09/26/2021 1005   BILITOT 0.5 11/19/2021 1039   BILITOT 0.6 09/26/2021 1005       Impression and Plan: Katrina Walters is a very pleasant 66 yo caucasian female with iron deficiency anemia.  Iron studies ar pending.  Follow-up in 3 months.   Lottie Dawson, NP 6/5/202311:08 AM

## 2022-02-18 ENCOUNTER — Telehealth: Payer: Self-pay | Admitting: *Deleted

## 2022-02-18 NOTE — Telephone Encounter (Signed)
Called again and lvm for call back to get scheduled for (3) doses of IV Iron.

## 2022-02-27 ENCOUNTER — Ambulatory Visit: Payer: Medicare Other

## 2022-02-28 ENCOUNTER — Inpatient Hospital Stay: Payer: Medicare Other

## 2022-02-28 ENCOUNTER — Other Ambulatory Visit: Payer: Self-pay | Admitting: Medical

## 2022-03-03 NOTE — Telephone Encounter (Signed)
Pt ready for scheduling on or after 03/19/22  Out-of-pocket cost due at time of visit: $306  Primary: [facility] Medicare Prolia co-insurance: 20% (approximately $276) Admin fee co-insurance: $30  Secondary: n/a Prolia co-insurance:  Admin fee co-insurance:   Deductible: does not apply  Prior Auth: APPROVED PA#: [id number] Valid 09/28/21-09/28/22  ** This summary of benefits is an estimation of the patient's out-of-pocket cost. Exact cost may vary based on individual plan coverage.

## 2022-03-06 ENCOUNTER — Inpatient Hospital Stay: Payer: Medicare Other

## 2022-03-06 ENCOUNTER — Ambulatory Visit (INDEPENDENT_AMBULATORY_CARE_PROVIDER_SITE_OTHER): Payer: Medicare Other | Admitting: Medical

## 2022-03-06 VITALS — BP 127/74 | HR 79 | Temp 97.9°F | Resp 18 | Ht 63.0 in | Wt 103.2 lb

## 2022-03-06 VITALS — BP 103/58 | HR 67 | Temp 98.2°F | Resp 17

## 2022-03-06 DIAGNOSIS — E785 Hyperlipidemia, unspecified: Secondary | ICD-10-CM

## 2022-03-06 DIAGNOSIS — K589 Irritable bowel syndrome without diarrhea: Secondary | ICD-10-CM

## 2022-03-06 DIAGNOSIS — K648 Other hemorrhoids: Secondary | ICD-10-CM | POA: Diagnosis not present

## 2022-03-06 DIAGNOSIS — Z87440 Personal history of urinary (tract) infections: Secondary | ICD-10-CM

## 2022-03-06 DIAGNOSIS — A239 Brucellosis, unspecified: Secondary | ICD-10-CM

## 2022-03-06 DIAGNOSIS — D649 Anemia, unspecified: Secondary | ICD-10-CM

## 2022-03-06 DIAGNOSIS — M791 Myalgia, unspecified site: Secondary | ICD-10-CM

## 2022-03-06 DIAGNOSIS — Z951 Presence of aortocoronary bypass graft: Secondary | ICD-10-CM | POA: Diagnosis not present

## 2022-03-06 DIAGNOSIS — D509 Iron deficiency anemia, unspecified: Secondary | ICD-10-CM

## 2022-03-06 DIAGNOSIS — R109 Unspecified abdominal pain: Secondary | ICD-10-CM

## 2022-03-06 LAB — COMPREHENSIVE METABOLIC PANEL
ALT: 16 U/L (ref 0–35)
AST: 17 U/L (ref 0–37)
Albumin: 4.4 g/dL (ref 3.5–5.2)
Alkaline Phosphatase: 76 U/L (ref 39–117)
BUN: 14 mg/dL (ref 6–23)
CO2: 31 mEq/L (ref 19–32)
Calcium: 9.6 mg/dL (ref 8.4–10.5)
Chloride: 96 mEq/L (ref 96–112)
Creatinine, Ser: 0.83 mg/dL (ref 0.40–1.20)
GFR: 74.18 mL/min (ref >60.00)
Glucose, Bld: 80 mg/dL (ref 70–99)
Potassium: 4.4 mEq/L (ref 3.5–5.1)
Sodium: 135 mEq/L (ref 135–145)
Total Bilirubin: 1.3 mg/dL — ABNORMAL HIGH (ref 0.2–1.2)
Total Protein: 7.2 g/dL (ref 6.0–8.3)

## 2022-03-06 LAB — LIPID PANEL
Cholesterol: 106 mg/dL (ref 0–200)
HDL: 34.3 mg/dL — ABNORMAL LOW (ref >39.00)
LDL Cholesterol: 46 mg/dL (ref 0–99)
NonHDL: 72.12
Total CHOL/HDL Ratio: 3
Triglycerides: 129 mg/dL (ref 0.0–149.0)
VLDL: 25.8 mg/dL (ref 0.0–40.0)

## 2022-03-06 LAB — LIPASE: Lipase: 15 U/L (ref 11.0–59.0)

## 2022-03-06 LAB — CK: Total CK: 25 U/L (ref 7–177)

## 2022-03-06 MED ORDER — SODIUM CHLORIDE 0.9 % IV SOLN: Freq: Once | INTRAVENOUS | Status: AC

## 2022-03-06 MED ORDER — SODIUM CHLORIDE 0.9 % IV SOLN
300.0000 mg | Freq: Once | INTRAVENOUS | Status: AC
  Administered 2022-03-06: 300 mg via INTRAVENOUS
  Filled 2022-03-06: qty 300

## 2022-03-06 MED ORDER — SODIUM CHLORIDE 0.9 % IV SOLN
40.0000 mg | Freq: Once | INTRAVENOUS | Status: AC
  Administered 2022-03-06: 40 mg via INTRAVENOUS
  Filled 2022-03-06: qty 4

## 2022-03-06 NOTE — Progress Notes (Signed)
Subjective:    Patient ID: Katrina Walters, female    DOB: Jun 27, 1956, 66 y.o.   MRN: [id number]  HPI Pt in for some abdomen pain on both left and right side.  Pain comes and goes. States pain is intermittent. Describes pain will occur ever couple of weeks  for past 2-3 months. When have pain for 24 hours and then resolves. Pt on linzess for ibs. In past primary had constipation. Has seen Dr. Cathleen Corti.  Pt up to date on colonoscopy. No diverticulosis.   Pt had recently some uti after intercourse. Pt is on macrobid 100 mg twice daily continuously as preventative. Pt states after intercourse will get urinary infection symptoms.  Recent fever and chills at night.   Pt has high cholesterol. Pt cardiologist recently started her on daily crestor.  Occasionally rare thigh pain  Review of Systems  Constitutional:  Negative for chills, fatigue and fever.  Respiratory:  Negative for cough, chest tightness, shortness of breath and wheezing.   Cardiovascular:  Negative for chest pain and palpitations.  Gastrointestinal:  Positive for abdominal pain. Negative for abdominal distention, anal bleeding, blood in stool, diarrhea, nausea and vomiting.       Last day had pain was 10 days ago.   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   PONV (postoperative nausea and vomiting)      Social History   Socioeconomic History   Marital status: Married    Spouse name: Not on file   Number of children: Not on file   Years of education: Not on file   Highest education level: Not on file  Occupational History   Occupation: unemployed at the time  Tobacco Use   Smoking status: Never   Smokeless tobacco: Never  Vaping Use   Vaping Use: Never used  Substance and Sexual Activity   Alcohol use: Not Currently     Alcohol/week: 0.0 standard drinks of alcohol    Comment: occassionally   Drug use: No    Comment: CBD gummies   Sexual activity: Yes  Other Topics Concern   Not on file  Social History Narrative   Not on file   Social Determinants of Health   Financial Resource Strain: Medium Risk (12/24/2021)   Overall Financial Resource Strain (CARDIA)    Difficulty of Paying Living Expenses: Somewhat hard  Food Insecurity: Food Insecurity Present (12/24/2021)   Hunger Vital Sign    Worried About Running Out of Food in the Last Year: Sometimes true    Ran Out of Food in the Last Year: Never true  Transportation Needs: No Transportation Needs (12/24/2021)   PRAPARE - Hydrologist (Medical): No    Lack of Transportation (Non-Medical): No  Physical Activity: Insufficiently Active (12/24/2021)   Exercise Vital Sign    Days of Exercise per Week: 3 days    Minutes of Exercise per Session: 30 min  Stress: Stress Concern Present (12/24/2021)   [place]    Feeling of Stress : To some extent  Social Connections: Moderately Isolated (12/24/2021)   Social Connection and Isolation Panel [NHANES]    Frequency of Communication with Friends and Family: More than three times a week  Frequency of Social Gatherings with Friends and Family: Once a week    Attends Religious Services: Never    Marine scientist or Organizations: No    Attends Archivist Meetings: Never    Marital Status: Married  Human resources officer Violence: Not At Risk (12/24/2021)   Humiliation, Afraid, Rape, and Kick questionnaire    Fear of Current or Ex-Partner: No    Emotionally Abused: No    Physically Abused: No    Sexually Abused: No    Past Surgical History:  Procedure Laterality Date   ABDOMINAL HYSTERECTOMY  08/2019   ANAL RECTAL MANOMETRY N/A 11/12/2020   Procedure: ANO RECTAL MANOMETRY;  Surgeon: Lavena Bullion,  DO;  Location: [facility];  Service: Gastroenterology;  Laterality: N/A;   BLADDER SURGERY  08/2019   BREAST REDUCTION SURGERY  2006   CARDIAC CATHETERIZATION N/A 07/17/2016   Procedure: Left Heart Cath and Coronary Angiography;  Surgeon: Burnell Blanks, MD;  Location: [facility];  Service: Cardiovascular;  Laterality: N/A;   CATARACT EXTRACTION W/ INTRAOCULAR LENS IMPLANT Bilateral    COLONOSCOPY  2015   CORONARY ARTERY BYPASS GRAFT N/A 06/04/2021   Procedure: CORONARY ARTERY BYPASS GRAFTING (CABG), ON PUMP, TIMES FOUR, USING LEFT INTERNAL MAMMARY ARTERY, LEFT RADIAL ARTERY (HARVESTED OPEN), AND RIGHT GREATER SAPHENOUS VEIN (HARVESTED ENDOSCOPICALLY);  Surgeon: Lajuana Matte, MD;  Location: [facility];  Service: Open Heart Surgery;  Laterality: N/A;   EYE SURGERY     LEFT HEART CATH AND CORONARY ANGIOGRAPHY N/A 04/22/2021   Procedure: LEFT HEART CATH AND CORONARY ANGIOGRAPHY;  Surgeon: Belva Crome, MD;  Location: [facility];  Service: Cardiovascular;  Laterality: N/A;   POLYPECTOMY  2015   RADIAL ARTERY HARVEST Left 06/04/2021   Procedure: RADIAL ARTERY HARVEST;  Surgeon: Lajuana Matte, MD;  Location: [facility];  Service: Open Heart Surgery;  Laterality: Left;   TEE WITHOUT CARDIOVERSION N/A 06/04/2021   Procedure: TRANSESOPHAGEAL ECHOCARDIOGRAM (TEE);  Surgeon: Lajuana Matte, MD;  Location: [facility];  Service: Open Heart Surgery;  Laterality: N/A;   TONSILLECTOMY      Family History  Problem Relation Age of Onset   Heart disease Mother        died at 57 of heart attack   Hypertension Mother    Heart attack Mother    Heart disease Father        had heart attack at age 68   Heart attack Father    Rectal cancer Father 17   Cancer Paternal Aunt    Stomach cancer Paternal Grandmother    Colon cancer Neg Hx    Colon polyps Neg Hx    Esophageal cancer Neg Hx     Allergies  Allergen Reactions   Amoxicillin     Had fever with flu like symptoms. Has  patient had a PCN reaction causing immediate rash, facial/tongue/throat swelling, SOB or lightheadedness with hypotension: no Has patient had a PCN reaction causing severe rash involving mucus membranes or skin necrosis: no Has patient had a PCN reaction that required hospitalization no Has patient had a PCN reaction occurring within the last 10 years: yes If all of the above answers are "NO", then may proceed with Cephalosporin use.   Clopidogrel & Aspirin Itching   Amlodipine Nausea Only    Dizzy, heavy feeling, tongue burning   Atorvastatin Other (See Comments)    Myalgia   Crestor [Rosuvastatin] Itching   Metoprolol Other (See Comments)  hair loss   Pravastatin Itching   Repatha [Evolocumab] Itching    Current Outpatient Medications on File Prior to Visit  Medication Sig Dispense Refill   aspirin 81 MG chewable tablet Chew by mouth daily.     colchicine 0.6 MG tablet TAKE 1 TABLET BY MOUTH TWICE A DAY 180 tablet 3   Cranberry-Vitamin C 250-60 MG CAPS Take 2 tablets by mouth daily.     D-MANNOSE PO Take 2 tablets by mouth daily.     diphenhydrAMINE (BENADRYL) 25 MG tablet Take 25 mg by mouth at bedtime.     FIBER PO Take 2 tablets by mouth daily.     fluticasone (FLONASE) 50 MCG/ACT nasal spray SPRAY 2 SPRAYS INTO EACH NOSTRIL EVERY DAY 48 mL 1   linaclotide (LINZESS) 290 MCG CAPS capsule Take 1 capsule (290 mcg total) by mouth daily before breakfast. 90 capsule 5   nitrofurantoin, macrocrystal-monohydrate, (MACROBID) 100 MG capsule Take 1 capsule (100 mg total) by mouth 2 (two) times daily. 90 capsule 3   NON FORMULARY Take 0.5 tablets by mouth at bedtime. CBD oil - for pain Gummies     pantoprazole (PROTONIX) 40 MG tablet TAKE 1 TABLET BY MOUTH EVERY DAY 90 tablet 1   Polyethyl Glycol-Propyl Glycol (SYSTANE OP) Place 1 drop into both eyes in the morning and at bedtime.     PREMARIN vaginal cream Place 3.55 Applicatorfuls vaginally See admin instructions. Insert 0.5 grams  vaginally every other night 30 g 6   rosuvastatin (CRESTOR) 10 MG tablet Take 1 tablet (10 mg total) by mouth daily. 90 tablet 3   ezetimibe (ZETIA) 10 MG tablet Take 1 tablet (10 mg total) by mouth daily. 90 tablet 3   No current facility-administered medications on file prior to visit.    BP 127/74   Pulse 79   Temp 97.9 F (36.6 C)   Resp 18   Ht '5\' 3"'$  (1.6 m)   Wt 103 lb 3.2 oz (46.8 kg)   LMP  (LMP Unknown)   SpO2 99%   BMI 18.28 kg/m        Objective:   Physical Exam  General Mental Status- Alert. General Appearance- Not in acute distress.   Skin General: Color- Normal Color. Moisture- Normal Moisture.  Neck Carotid Arteries- Normal color. Moisture- Normal Moisture. No carotid bruits. No JVD.  Chest and Lung Exam Auscultation: Breath Sounds:-Normal.  Cardiovascular Auscultation:Rythm- Regular. Murmurs & Other Heart Sounds:Auscultation of the heart reveals- No Murmurs.  Abdomen Inspection:-Inspeection Normal. Palpation/Percussion:Note:No mass. Palpation and Percussion of the abdomen reveal- Non Tender, Non Distended + BS, no rebound or guarding.    Neurologic Cranial Nerve exam:- CN III-XII intact(No nystagmus), symmetric smile. Strength:- 5/5 equal and symmetric strength both upper and lower extremities.       Assessment & Plan:   Patient Instructions  Intermittent mild abdomen pain lasting one day and resolving. Frequency every 2 weeks. Hx of ibs-C. No pain presently on exam. Considering ibs-C causing pain intermittent. Continue linzess and eat healthy diet. If random brief pain occurs can get 1 view abdomen xray. With that xray can assess stool burden to see if constipation factor/cause of pain.  If you have more than one day pain or severe pain consider ct ab/pelvis to evaluate other potential cause.  Cmp, ua urine culture, UA and cmp today. Cbc to be done today with hematologist.    High cholesterol. Get lipid panel today.   Mediterranean  disease/ continue cochicine.  Hx of uti.  Abd pain as above described. No current uti signs/symptoms.  Recent rare intermittent left thigh pain. Will get ck and follow comp in light of pt concern colchcine/statin reaction.  Follow up in 2 weeks or sooner if needed.   Mackie Pai, PA-C   Time spent with patient today was  42 minutes which consisted of chart rediew, discussing diagnosis, work up, treatment and documentation.

## 2022-03-06 NOTE — Patient Instructions (Addendum)
Intermittent mild abdomen pain lasting one day and resolving. Frequency every 2 weeks. Hx of ibs-C. No pain presently on exam. Considering ibs-C causing pain intermittent. Continue linzess and eat healthy diet. If random brief pain occurs can get 1 view abdomen xray. With that xray can assess stool burden to see if constipation factor/cause of pain.  If you have more than one day pain or severe pain consider ct ab/pelvis to evaluate other potential cause.  Cmp, ua urine culture, UA and cmp today. Cbc to be done today with hematologist.    High cholesterol. Get lipid panel today.   Mediterranean disease/ continue cochicine.  Hx of uti. Abd pain as above described. No current uti signs/symptoms.  Recent rare intermittent left thigh pain. Will get ck and follow comp in light of pt concern colchcine/statin reaction.  Follow up in 2 weeks or sooner if needed.

## 2022-03-06 NOTE — Patient Instructions (Signed)

## 2022-03-07 ENCOUNTER — Encounter: Payer: Self-pay | Admitting: Medical

## 2022-03-07 DIAGNOSIS — M810 Age-related osteoporosis without current pathological fracture: Secondary | ICD-10-CM

## 2022-03-07 LAB — URINE CULTURE
MICRO NUMBER:: 13588861
SPECIMEN QUALITY:: ADEQUATE

## 2022-03-07 NOTE — Telephone Encounter (Signed)
Message sent via MyChart.

## 2022-03-10 ENCOUNTER — Encounter: Payer: Self-pay | Admitting: Family

## 2022-03-12 NOTE — Telephone Encounter (Signed)
Pt says she has been approved but only has to pay $25.00 for the injection. Do you know where the 25.00 could have come from?

## 2022-03-13 ENCOUNTER — Inpatient Hospital Stay: Payer: Medicare Other

## 2022-03-14 ENCOUNTER — Encounter: Payer: Self-pay | Admitting: Medical

## 2022-03-18 MED ORDER — DENOSUMAB 60 MG/ML ~~LOC~~ SOSY: 60.0000 mg | PREFILLED_SYRINGE | Freq: Once | SUBCUTANEOUS | 0 refills | Status: AC

## 2022-03-19 ENCOUNTER — Ambulatory Visit: Payer: Medicare Other

## 2022-04-09 ENCOUNTER — Other Ambulatory Visit: Payer: Self-pay

## 2022-04-09 DIAGNOSIS — E782 Mixed hyperlipidemia: Secondary | ICD-10-CM

## 2022-04-09 LAB — LIPID PANEL
Chol/HDL Ratio: 3.2 ratio (ref 0.0–4.4)
Cholesterol, Total: 110 mg/dL (ref 100–199)
HDL: 34 mg/dL — ABNORMAL LOW (ref >39)
LDL Chol Calc (NIH): 52 mg/dL (ref 0–99)
Triglycerides: 139 mg/dL (ref 0–149)
VLDL Cholesterol Cal: 24 mg/dL (ref 5–40)

## 2022-04-10 ENCOUNTER — Ambulatory Visit: Payer: Medicare Other | Admitting: Family Medicine

## 2022-04-10 ENCOUNTER — Other Ambulatory Visit (HOSPITAL_COMMUNITY)
Admission: RE | Admit: 2022-04-10 | Discharge: 2022-04-10 | Disposition: A | Discharge status: Home / Self Care | Payer: Medicare Other | Source: Ambulatory Visit | Attending: Family Medicine | Admitting: Family Medicine

## 2022-04-10 ENCOUNTER — Encounter: Payer: Self-pay | Admitting: Family Medicine

## 2022-04-10 VITALS — BP 128/70 | HR 82 | Wt 103.0 lb

## 2022-04-10 DIAGNOSIS — Z87440 Personal history of urinary (tract) infections: Secondary | ICD-10-CM

## 2022-04-10 DIAGNOSIS — N898 Other specified noninflammatory disorders of vagina: Secondary | ICD-10-CM

## 2022-04-10 LAB — POCT URINALYSIS DIPSTICK
Bilirubin, UA: NEGATIVE
Blood, UA: NEGATIVE
Glucose, UA: NEGATIVE
Ketones, UA: NEGATIVE
Nitrite, UA: NEGATIVE
Protein, UA: NEGATIVE
Spec Grav, UA: 1.01 (ref 1.010–1.025)
Urobilinogen, UA: 0.2 E.U./dL
pH, UA: 5 (ref 5.0–8.0)

## 2022-04-10 MED ORDER — NITROFURANTOIN MONOHYD MACRO 100 MG PO CAPS: 100.0000 mg | ORAL_CAPSULE | Freq: Every evening | ORAL | 5 refills | Status: DC | PRN

## 2022-04-10 MED ORDER — PREMARIN 0.625 MG/GM VA CREA: 0.5000 g | TOPICAL_CREAM | VAGINAL | 12 refills | Status: DC

## 2022-04-10 NOTE — Progress Notes (Signed)
Subjective:    Patient ID: Katrina Walters, female    DOB: 10-31-1955, 66 y.o.   MRN: [id number]  Katrina Walters is a 66 y.o. female with prior hysterectomy who comes to the office today reporting nausea and early satiety that she attributes to taking Macrobid. She was started on Macrobid 6 months ago for recurrent UTIs that seemed to occur each time she had intercourse. She reports having no UTIs while on Macrobid, but stopped taking it 2 months ago because it was causing her nausea and decreased appetite. She states that she has not had intercourse since she stopped Macrobid, and she has had no UTIs over the past 2 months. She states that her and her husband would like to start having intercourse again, but she is worried about UTIs.   Additionally, she complains of some vaginal itching and irritation and is concerned she may have a yeast infection.     Review of Systems  Constitutional:  Positive for appetite change.  Gastrointestinal:  Positive for nausea. Negative for constipation, diarrhea and vomiting.  Genitourinary:  Positive for vaginal pain (itching). Negative for vaginal bleeding and vaginal discharge.       Objective:   Physical Exam Constitutional:      General: She is not in acute distress.    Appearance: She is not ill-appearing.  Cardiovascular:     Rate and Rhythm: Normal rate and regular rhythm.     Heart sounds: No murmur heard. Pulmonary:     Effort: Pulmonary effort is normal.     Breath sounds: Normal breath sounds.  Abdominal:     Tenderness: There is no abdominal tenderness.  Neurological:     General: No focal deficit present.        Assessment & Plan:  Katrina Walters is a 66 y.o. female comes into the office after stopping Macrobid 2 months ago with concern that she may have another UTI if she has intercourse while not taking Macrobid. She states she stopped Macrobid because it was causing her nausea and decreased appetite. Additionally, she has some vaginal  itching and irritation and is concerned she may have a yeast infection. Exam is reassuring, GU exam was deferred.   UTI Prophylaxis - Macrobid 100 mg after intercourse  Possible Yeast Infection - Self-swab today    Katrina Walters, Katrina Walters    Attestation of Supervision of Student:  I confirm that I have verified the information documented in the medical student's note and that I have also personally performed the history, physical exam and all medical decision making activities.  I have verified that all services and findings are accurately documented in this student's note; and I agree with management and plan as outlined in the documentation. I have also made any necessary editorial changes.  Patient seen for follow-up of frequent urinary tract infections.  Patient was placed on Macrobid to take nightly.  However she was taking it twice a day for a few weeks.  She was developing some stomach irritation when taking it on empty stomach.  While she was on the antibiotics, she did not have any bladder infections.  Since stopping the antibiotics, she has not had any intercourse.  She does feel like she has some vaginal itching like a yeast infection.  BP 128/70   Pulse 82   Wt 103 lb (46.7 kg)   LMP  (LMP Unknown)   BMI 18.25 kg/m  NAD RR, normal S1/S2 CTA Soft, NT, ND.  1. History of recurrent UTIs  We will change the patient to using Macrobid after intercourse.  If this is not effective, we could trial a different antibiotic such as trimethoprim. - POCT urinalysis dipstick  2. Vaginal irritation Vaginal culture - Cervicovaginal ancillary only( [facility])   Badger Lee for [facility], [facility] 04/10/2022 5:33 PM

## 2022-04-11 LAB — CERVICOVAGINAL ANCILLARY ONLY
Bacterial Vaginitis (gardnerella): NEGATIVE
Candida Glabrata: NEGATIVE
Candida Vaginitis: NEGATIVE
Comment: NEGATIVE
Comment: NEGATIVE
Comment: NEGATIVE

## 2022-04-11 NOTE — Progress Notes (Signed)
Cardiology Office Note:   Date:  04/15/2022  NAME:  Katrina Walters    MRN: [id number] DOB:  02/04/1956   PCP:  Mackie Pai, PA-C  Cardiologist:  Evalina Field, MD  Electrophysiologist:  None   Referring MD: Elise Benne   Chief Complaint  Patient presents with   Follow-up        History of Present Illness:   Katrina Walters is a 66 y.o. female with a hx of CAD status post CABG, hyperlipidemia who presents for follow-up.  She presents with her husband. Reports no concerning symptoms of angina. Walking up to 1 hour every other day without limitations. Reports some cramping on crestor. She is on cochicine for mediterranean fever so could be contributing. She has pruritus with repatha/praluent. Willing to try this again. No palpitations. Watching her diet. Overall, doing well.   Problem List CAD -3v CAD -CABG 06/04/2021 -LIMA-LAD, SVG-OM, SVG-PDA, SVG-Diag 2. HLD -Intolerant of high intensity statin -Pruritus on repatha/praluent -T chol 110, HDL 34, LDL 52, TG 139 3. Mediterranean Fever 4. Iron deficiency anemia   Past Medical History: 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 23 years in [place], managed on colchicine   PONV (postoperative nausea and vomiting)     Past Surgical History: Past Surgical History:  Procedure Laterality Date   ABDOMINAL HYSTERECTOMY  08/2019   ANAL RECTAL MANOMETRY N/A 11/12/2020   Procedure: ANO RECTAL MANOMETRY;  Surgeon: Lavena Bullion, DO;  Location: [facility];  Service: Gastroenterology;  Laterality: N/A;   BLADDER SURGERY  08/2019   BREAST REDUCTION SURGERY  2006   CARDIAC CATHETERIZATION N/A 07/17/2016   Procedure: Left Heart Cath and Coronary Angiography;  Surgeon: Burnell Blanks, MD;  Location: [facility];  Service: Cardiovascular;  Laterality: N/A;    CATARACT EXTRACTION W/ INTRAOCULAR LENS IMPLANT Bilateral    COLONOSCOPY  2015   CORONARY ARTERY BYPASS GRAFT N/A 06/04/2021   Procedure: CORONARY ARTERY BYPASS GRAFTING (CABG), ON PUMP, TIMES FOUR, USING LEFT INTERNAL MAMMARY ARTERY, LEFT RADIAL ARTERY (HARVESTED OPEN), AND RIGHT GREATER SAPHENOUS VEIN (HARVESTED ENDOSCOPICALLY);  Surgeon: Lajuana Matte, MD;  Location: [place];  Service: Open Heart Surgery;  Laterality: N/A;   EYE SURGERY     LEFT HEART CATH AND CORONARY ANGIOGRAPHY N/A 04/22/2021   Procedure: LEFT HEART CATH AND CORONARY ANGIOGRAPHY;  Surgeon: Belva Crome, MD;  Location: [facility];  Service: Cardiovascular;  Laterality: N/A;   POLYPECTOMY  2015   RADIAL ARTERY HARVEST Left 06/04/2021   Procedure: RADIAL ARTERY HARVEST;  Surgeon: Lajuana Matte, MD;  Location: [place];  Service: Open Heart Surgery;  Laterality: Left;   TEE WITHOUT CARDIOVERSION N/A 06/04/2021   Procedure: TRANSESOPHAGEAL ECHOCARDIOGRAM (TEE);  Surgeon: Lajuana Matte, MD;  Location: [place];  Service: Open Heart Surgery;  Laterality: N/A;   TONSILLECTOMY      Current Medications: Current Meds  Medication Sig   aspirin 81 MG chewable tablet Chew by mouth daily.   colchicine 0.6 MG tablet TAKE 1 TABLET BY MOUTH TWICE A DAY   Cranberry-Vitamin C 250-60 MG CAPS Take 2 tablets by mouth daily.   D-MANNOSE PO Take 2 tablets by mouth daily.   diphenhydrAMINE (BENADRYL) 25 MG tablet Take 25 mg by mouth at bedtime.  ezetimibe (ZETIA) 10 MG tablet Take 1 tablet (10 mg total) by mouth daily.   linaclotide (LINZESS) 290 MCG CAPS capsule Take 1 capsule (290 mcg total) by mouth daily before breakfast.   nitrofurantoin, macrocrystal-monohydrate, (MACROBID) 100 MG capsule Take 1 capsule (100 mg total) by mouth at bedtime as needed (after intercourse).   NON FORMULARY Take 0.5 tablets by mouth at bedtime. CBD oil - for pain Gummies   pantoprazole (PROTONIX) 40 MG tablet TAKE 1 TABLET BY MOUTH EVERY  DAY   Polyethyl Glycol-Propyl Glycol (SYSTANE OP) Place 1 drop into both eyes in the morning and at bedtime.   PREMARIN vaginal cream Place 1.61 Applicatorfuls vaginally See admin instructions. Insert 0.5 grams vaginally every other night   rosuvastatin (CRESTOR) 10 MG tablet Take 1 tablet (10 mg total) by mouth daily.     Allergies:    Amoxicillin, Clopidogrel & aspirin, Amlodipine, Atorvastatin, Crestor [rosuvastatin], Metoprolol, Pravastatin, and Repatha [evolocumab]   Social History: Social History   Socioeconomic History   Marital status: Married    Spouse name: Not on file   Number of children: Not on file   Years of education: Not on file   Highest education level: Not on file  Occupational History   Occupation: unemployed at the time  Tobacco Use   Smoking status: Never   Smokeless tobacco: Never  Vaping Use   Vaping Use: Never used  Substance and Sexual Activity   Alcohol use: Not Currently    Alcohol/week: 0.0 standard drinks of alcohol    Comment: occassionally   Drug use: No    Comment: CBD gummies   Sexual activity: Yes  Other Topics Concern   Not on file  Social History Narrative   Not on file   Social Determinants of Health   Financial Resource Strain: Medium Risk (12/24/2021)   Overall Financial Resource Strain (CARDIA)    Difficulty of Paying Living Expenses: Somewhat hard  Food Insecurity: Food Insecurity Present (12/24/2021)   Hunger Vital Sign    Worried About Running Out of Food in the Last Year: Sometimes true    Ran Out of Food in the Last Year: Never true  Transportation Needs: No Transportation Needs (12/24/2021)   PRAPARE - Hydrologist (Medical): No    Lack of Transportation (Non-Medical): No  Physical Activity: Insufficiently Active (12/24/2021)   Exercise Vital Sign    Days of Exercise per Week: 3 days    Minutes of Exercise per Session: 30 min  Stress: Stress Concern Present (12/24/2021)   [place]    Feeling of Stress : To some extent  Social Connections: Moderately Isolated (12/24/2021)   Social Connection and Isolation Panel [NHANES]    Frequency of Communication with Friends and Family: More than three times a week    Frequency of Social Gatherings with Friends and Family: Once a week    Attends Religious Services: Never    Marine scientist or Organizations: No    Attends Music therapist: Never    Marital Status: Married     Family History: The patient's family history includes Cancer in her paternal aunt; Heart attack in her father and mother; Heart disease in her father and mother; Hypertension in her mother; Rectal cancer (age of onset: 63) in her father; Stomach cancer in her paternal grandmother. There is no history of Colon cancer, Colon polyps, or Esophageal cancer.  ROS:   All  other ROS reviewed and negative. Pertinent positives noted in the HPI.     EKGs/Labs/Other Studies Reviewed:   The following studies were personally reviewed by me today:  Recent Labs: 06/05/2021: Magnesium 2.3 02/10/2022: Hemoglobin 16.4; Platelet Count 440 03/06/2022: ALT 16; BUN 14; Creatinine, Ser 0.83; Potassium 4.4; Sodium 135   Recent Lipid Panel    Component Value Date/Time   CHOL 110 04/09/2022 1122   TRIG 139 04/09/2022 1122   HDL 34 (L) 04/09/2022 1122   CHOLHDL 3.2 04/09/2022 1122   CHOLHDL 3 03/06/2022 1145   VLDL 25.8 03/06/2022 1145   LDLCALC 52 04/09/2022 1122    Physical Exam:   VS:  BP 102/78   Pulse 82   Ht '5\' 3"'$  (1.6 m)   Wt 101 lb 9.6 oz (46.1 kg)   LMP  (LMP Unknown)   SpO2 95%   BMI 18.00 kg/m    Wt Readings from Last 3 Encounters:  04/15/22 101 lb 9.6 oz (46.1 kg)  04/10/22 103 lb (46.7 kg)  03/06/22 103 lb 3.2 oz (46.8 kg)    General: Well nourished, well developed, in no acute distress Head: Atraumatic, normal size  Eyes: PEERLA, EOMI  Neck: Supple, no JVD Endocrine: No  thryomegaly Cardiac: Normal S1, S2; RRR; no murmurs, rubs, or gallops Lungs: Clear to auscultation bilaterally, no wheezing, rhonchi or rales  Abd: Soft, nontender, no hepatomegaly  Ext: No edema, pulses 2+ Musculoskeletal: No deformities, BUE and BLE strength normal and equal Skin: Warm and dry, no rashes   Neuro: Alert and oriented to person, place, time, and situation, CNII-XII grossly intact, no focal deficits  Psych: Normal mood and affect   ASSESSMENT:   Katrina Walters is a 66 y.o. female who presents for the following: 1. Coronary artery disease involving coronary bypass graft of native heart without angina pectoris   2. S/P CABG x 4   3. Mixed hyperlipidemia     PLAN:   1. Coronary artery disease involving coronary bypass graft of native heart without angina pectoris 2. S/P CABG x 4 3. Mixed hyperlipidemia -s/p CABG 06/04/2021. Doing well. Normal LV function. On crestor zetia with LDL at goal. Having some cramping on crestor. Willing to try PCSK9 inhibitor therapy again. She reports she was given a cream for pruritus. We will refer her back to pharmacy clinic for PCSK9 inhibitor therapy versus leqvio. She may quality for leqvio since she is now on medicare.  -she will see me back in 6 months.   Disposition: Return in about 6 months (around 10/16/2022).  Medication Adjustments/Labs and Tests Ordered: Current medicines are reviewed at length with the patient today.  Concerns regarding medicines are outlined above.  Orders Placed This Encounter  Procedures   AMB Referral to [facility]   No orders of the defined types were placed in this encounter.   Patient Instructions  Medication Instructions:  The current medical regimen is effective;  continue present plan and medications.  *If you need a refill on your cardiac medications before your next appointment, please call your pharmacy*    Follow-Up: At [facility], you and your health needs are our priority.  As part  of our continuing mission to provide you with exceptional heart care, we have created designated Provider Care Teams.  These Care Teams include your primary Cardiologist (physician) and Advanced Practice Providers (APPs -  Physician Assistants and Nurse Practitioners) who all work together to provide you with the care you need, when you need it.  We recommend signing up for the patient portal called "MyChart".  Sign up information is provided on this After Visit Summary.  MyChart is used to connect with patients for Virtual Visits (Telemedicine).  Patients are able to view lab/test results, encounter notes, upcoming appointments, etc.  Non-urgent messages can be sent to your provider as well.   To learn more about what you can do with MyChart, go to [email or web address].    Your next appointment:   6 month(s)  The format for your next appointment:   In Person  Provider:   Evalina Field, MD    Other Instructions Referral to pharmacy team to discuss medication options. - they will be contact for an appointment.           Time Spent with Patient: I have spent a total of 25 minutes with patient reviewing hospital notes, telemetry, EKGs, labs and examining the patient as well as establishing an assessment and plan that was discussed with the patient.  > 50% of time was spent in direct patient care.  Signed, Addison Naegeli. Audie Box, MD, [place]  [address], [place], [place] [address] [phone]  04/15/2022 5:10 PM

## 2022-04-14 ENCOUNTER — Ambulatory Visit: Payer: Medicare Other | Admitting: Cardiovascular Disease

## 2022-04-15 ENCOUNTER — Encounter: Payer: Self-pay | Admitting: Cardiovascular Disease

## 2022-04-15 ENCOUNTER — Ambulatory Visit: Payer: Medicare Other | Admitting: Cardiovascular Disease

## 2022-04-15 VITALS — BP 102/78 | HR 82 | Ht 63.0 in | Wt 101.6 lb

## 2022-04-15 DIAGNOSIS — Z951 Presence of aortocoronary bypass graft: Secondary | ICD-10-CM

## 2022-04-15 DIAGNOSIS — E782 Mixed hyperlipidemia: Secondary | ICD-10-CM

## 2022-04-15 DIAGNOSIS — I25810 Atherosclerosis of coronary artery bypass graft(s) without angina pectoris: Secondary | ICD-10-CM | POA: Diagnosis not present

## 2022-04-15 NOTE — Patient Instructions (Signed)
Medication Instructions:  The current medical regimen is effective;  continue present plan and medications.  *If you need a refill on your cardiac medications before your next appointment, please call your pharmacy*    Follow-Up: At [facility], you and your health needs are our priority.  As part of our continuing mission to provide you with exceptional heart care, we have created designated Provider Care Teams.  These Care Teams include your primary Cardiologist (physician) and Advanced Practice Providers (APPs -  Physician Assistants and Nurse Practitioners) who all work together to provide you with the care you need, when you need it.  We recommend signing up for the patient portal called "MyChart".  Sign up information is provided on this After Visit Summary.  MyChart is used to connect with patients for Virtual Visits (Telemedicine).  Patients are able to view lab/test results, encounter notes, upcoming appointments, etc.  Non-urgent messages can be sent to your provider as well.   To learn more about what you can do with MyChart, go to [email or web address].    Your next appointment:   6 month(s)  The format for your next appointment:   In Person  Provider:   Evalina Field, MD    Other Instructions Referral to pharmacy team to discuss medication options. - they will be contact for an appointment.

## 2022-05-08 ENCOUNTER — Ambulatory Visit: Payer: Medicare Other

## 2022-05-13 ENCOUNTER — Inpatient Hospital Stay: Payer: Medicare Other | Attending: Family

## 2022-05-13 ENCOUNTER — Inpatient Hospital Stay: Payer: Medicare Other | Admitting: Family

## 2022-07-08 NOTE — Telephone Encounter (Signed)
Last Prolia injection 09/18/2021

## 2022-07-09 ENCOUNTER — Ambulatory Visit: Payer: Medicare Other | Admitting: Family Medicine

## 2022-07-09 ENCOUNTER — Other Ambulatory Visit: Payer: Self-pay | Admitting: Family Medicine

## 2022-07-09 VITALS — BP 131/72 | HR 76 | Wt 104.0 lb

## 2022-07-09 DIAGNOSIS — Z87440 Personal history of urinary (tract) infections: Secondary | ICD-10-CM | POA: Diagnosis not present

## 2022-07-09 DIAGNOSIS — R81 Glycosuria: Secondary | ICD-10-CM

## 2022-07-09 MED ORDER — NITROFURANTOIN MONOHYD MACRO 100 MG PO CAPS: 100.0000 mg | ORAL_CAPSULE | Freq: Every evening | ORAL | 3 refills | Status: DC | PRN

## 2022-07-09 MED ORDER — PREMARIN 0.625 MG/GM VA CREA: 0.5000 g | TOPICAL_CREAM | VAGINAL | 12 refills | Status: DC

## 2022-07-09 NOTE — Progress Notes (Signed)
   Subjective:    Patient ID: Katrina Walters, female    DOB: [birth year], 66 y.o.   MRN: [id number]  HPI Patient seen for follow-up of recurrent UTIs.  She has been taking the Macrobid at night when she has intercourse.  She does notice that she has a day of dysuria within a couple of days of having sex.  This usually goes away over the course of a day.  Notices no other side effects, however the dysuria does bother her.  Has not been diagnosed with a UTI.  No pelvic pain or frequency.   Review of Systems     Objective:   Physical Exam Vitals reviewed.  Constitutional:      Appearance: Normal appearance.  Abdominal:     General: Abdomen is flat.     Palpations: Abdomen is soft.  Neurological:     General: No focal deficit present.     Mental Status: She is alert.  Psychiatric:        Mood and Affect: Mood normal.        Behavior: Behavior normal.        Thought Content: Thought content normal.        Judgment: Judgment normal.        Assessment & Plan:  1. History of recurrent UTIs In conversation with the patient, she would like to increase the Macrobid.  I discussed options of taking it every night versus taking it for 2 or 3 nights after intercourse.  She would like to take it every night to see how she does with that. If she does not tolerate taking it every night due to increasing nausea, we will have her take the Aguila for 2 to 3 days after having sex. Continue Premarin No acute infection in urine today.  2. Glucosuria UA showed a large amount of glucose in urine. Recommended following up with PCP./

## 2022-07-11 ENCOUNTER — Ambulatory Visit: Payer: Medicare Other | Admitting: Medical

## 2022-07-14 ENCOUNTER — Ambulatory Visit: Payer: Medicare Other | Admitting: Medical

## 2022-07-14 VITALS — BP 134/82 | HR 82 | Resp 18 | Ht 63.0 in | Wt 100.6 lb

## 2022-07-14 DIAGNOSIS — R81 Glycosuria: Secondary | ICD-10-CM

## 2022-07-14 DIAGNOSIS — A239 Brucellosis, unspecified: Secondary | ICD-10-CM | POA: Diagnosis not present

## 2022-07-14 DIAGNOSIS — R739 Hyperglycemia, unspecified: Secondary | ICD-10-CM | POA: Diagnosis not present

## 2022-07-14 DIAGNOSIS — M810 Age-related osteoporosis without current pathological fracture: Secondary | ICD-10-CM

## 2022-07-14 DIAGNOSIS — E875 Hyperkalemia: Secondary | ICD-10-CM

## 2022-07-14 LAB — COMPREHENSIVE METABOLIC PANEL
ALT: 47 U/L — ABNORMAL HIGH (ref 0–35)
AST: 43 U/L — ABNORMAL HIGH (ref 0–37)
Albumin: 4.5 g/dL (ref 3.5–5.2)
Alkaline Phosphatase: 90 U/L (ref 39–117)
BUN: 13 mg/dL (ref 6–23)
CO2: 29 mEq/L (ref 19–32)
Calcium: 10.4 mg/dL (ref 8.4–10.5)
Chloride: 97 mEq/L (ref 96–112)
Creatinine, Ser: 0.92 mg/dL (ref 0.40–1.20)
GFR: 65.39 mL/min (ref >60.00)
Glucose, Bld: 80 mg/dL (ref 70–99)
Potassium: 5.4 mEq/L — ABNORMAL HIGH (ref 3.5–5.1)
Sodium: 134 mEq/L — ABNORMAL LOW (ref 135–145)
Total Bilirubin: 0.8 mg/dL (ref 0.2–1.2)
Total Protein: 7.7 g/dL (ref 6.0–8.3)

## 2022-07-14 LAB — HEMOGLOBIN A1C: Hgb A1c MFr Bld: 5.5 % (ref 4.6–6.5)

## 2022-07-14 MED ORDER — DENOSUMAB 60 MG/ML ~~LOC~~ SOSY: 60.0000 mg | PREFILLED_SYRINGE | Freq: Once | SUBCUTANEOUS | 0 refills | Status: AC

## 2022-07-14 MED ORDER — COLCHICINE 0.6 MG PO TABS: 0.6000 mg | ORAL_TABLET | Freq: Two times a day (BID) | ORAL | 3 refills | Status: DC

## 2022-07-14 NOTE — Patient Instructions (Addendum)
Glucosuria on recent UA with gyn office and mild sugar elevation on past labs.   Osteoporosis- rx prolia to your pharmacy. Then we will give injection here. Let me know if issues with your pharmacy.   Mediterranean fever. Refilling your colchicine.   Follow up date to be determined after lab review.

## 2022-07-14 NOTE — Progress Notes (Addendum)
Subjective:    Patient ID: Katrina Walters, female    DOB: March 09, 1956, 66 y.o.   MRN: [id number]  HPI  Pt in for follow up.  Pt states had gyn office they saw a lot of sugar in the urine. In the pat sugar was mild elevated at 105. GFR in 70 range.   Pt also has osteopenia and osteoporosis. Pt states in past was on prolia for about one year. She was told was told would cost her $100 or more but that was too expensive. Pt states never got the the July prolia infection. In past had tried fosamax but eventually could not tolerate gi upset.  Pt never had side effects to prolia.    History of meditarranean fever. Referred to Dr. Benjamine Mola. His referral comment.  " Upon review of this referral Dr. Benjamine Mola has declined it. Per Dr. Benjamine Mola, nothing specific to add, patient already on recommended maintenance treatment. "  Dr Garen Grams also declined the referral in past.    Review of Systems  Constitutional:  Negative for chills, fatigue and fever.  Respiratory:  Negative for cough, chest tightness, shortness of breath and wheezing.   Cardiovascular:  Negative for chest pain and palpitations.  Gastrointestinal:  Negative for abdominal pain, diarrhea and vomiting.  Genitourinary:  Negative for difficulty urinating, dysuria, frequency, hematuria and urgency.  Musculoskeletal:  Negative for back pain, myalgias and neck pain.  Skin:  Negative for rash.  Neurological:  Negative for dizziness and light-headedness.  Hematological:  Negative for adenopathy. Does not bruise/bleed easily.  Psychiatric/Behavioral:  Negative for behavioral problems and decreased concentration.     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 89 years in [place], managed on colchicine   PONV (postoperative nausea and vomiting)      Social History   Socioeconomic History    Marital status: Married    Spouse name: Not on file   Number of children: Not on file   Years of education: Not on file   Highest education level: Not on file  Occupational History   Occupation: unemployed at the time  Tobacco Use   Smoking status: Never   Smokeless tobacco: Never  Vaping Use   Vaping Use: Never used  Substance and Sexual Activity   Alcohol use: Not Currently    Alcohol/week: 0.0 standard drinks of alcohol    Comment: occassionally   Drug use: No    Comment: CBD gummies   Sexual activity: Yes  Other Topics Concern   Not on file  Social History Narrative   Not on file   Social Determinants of Health   Financial Resource Strain: Medium Risk (12/24/2021)   Overall Financial Resource Strain (CARDIA)    Difficulty of Paying Living Expenses: Somewhat hard  Food Insecurity: Food Insecurity Present (12/24/2021)   Hunger Vital Sign    Worried About Running Out of Food in the Last Year: Sometimes true    Ran Out of Food in the Last Year: Never true  Transportation Needs: No Transportation Needs (12/24/2021)   PRAPARE - Hydrologist (Medical): No    Lack of Transportation (Non-Medical): No  Physical Activity: Insufficiently Active (12/24/2021)   Exercise Vital Sign    Days of Exercise per Week: 3 days    Minutes of  Exercise per Session: 30 min  Stress: Stress Concern Present (12/24/2021)   [place]    Feeling of Stress : To some extent  Social Connections: Moderately Isolated (12/24/2021)   Social Connection and Isolation Panel [NHANES]    Frequency of Communication with Friends and Family: More than three times a week    Frequency of Social Gatherings with Friends and Family: Once a week    Attends Religious Services: Never    Marine scientist or Organizations: No    Attends Archivist Meetings: Never    Marital Status: Married  Human resources officer  Violence: Not At Risk (12/24/2021)   Humiliation, Afraid, Rape, and Kick questionnaire    Fear of Current or Ex-Partner: No    Emotionally Abused: No    Physically Abused: No    Sexually Abused: No    Past Surgical History:  Procedure Laterality Date   ABDOMINAL HYSTERECTOMY  08/2019   ANAL RECTAL MANOMETRY N/A 11/12/2020   Procedure: ANO RECTAL MANOMETRY;  Surgeon: Lavena Bullion, DO;  Location: [facility];  Service: Gastroenterology;  Laterality: N/A;   BLADDER SURGERY  08/2019   BREAST REDUCTION SURGERY  2006   CARDIAC CATHETERIZATION N/A 07/17/2016   Procedure: Left Heart Cath and Coronary Angiography;  Surgeon: Burnell Blanks, MD;  Location: [place] [facility];  Service: Cardiovascular;  Laterality: N/A;   CATARACT EXTRACTION W/ INTRAOCULAR LENS IMPLANT Bilateral    COLONOSCOPY  2015   CORONARY ARTERY BYPASS GRAFT N/A 06/04/2021   Procedure: CORONARY ARTERY BYPASS GRAFTING (CABG), ON PUMP, TIMES FOUR, USING LEFT INTERNAL MAMMARY ARTERY, LEFT RADIAL ARTERY (HARVESTED OPEN), AND RIGHT GREATER SAPHENOUS VEIN (HARVESTED ENDOSCOPICALLY);  Surgeon: Lajuana Matte, MD;  Location: [place];  Service: Open Heart Surgery;  Laterality: N/A;   EYE SURGERY     LEFT HEART CATH AND CORONARY ANGIOGRAPHY N/A 04/22/2021   Procedure: LEFT HEART CATH AND CORONARY ANGIOGRAPHY;  Surgeon: Belva Crome, MD;  Location: [place] [facility];  Service: Cardiovascular;  Laterality: N/A;   POLYPECTOMY  2015   RADIAL ARTERY HARVEST Left 06/04/2021   Procedure: RADIAL ARTERY HARVEST;  Surgeon: Lajuana Matte, MD;  Location: [place];  Service: Open Heart Surgery;  Laterality: Left;   TEE WITHOUT CARDIOVERSION N/A 06/04/2021   Procedure: TRANSESOPHAGEAL ECHOCARDIOGRAM (TEE);  Surgeon: Lajuana Matte, MD;  Location: [place];  Service: Open Heart Surgery;  Laterality: N/A;   TONSILLECTOMY      Family History  Problem Relation Age of Onset   Heart disease Mother        died at 26 of heart  attack   Hypertension Mother    Heart attack Mother    Heart disease Father        had heart attack at age 66   Heart attack Father    Rectal cancer Father 29   Cancer Paternal Aunt    Stomach cancer Paternal Grandmother    Colon cancer Neg Hx    Colon polyps Neg Hx    Esophageal cancer Neg Hx     Allergies  Allergen Reactions   Amoxicillin     Had fever with flu like symptoms. Has patient had a PCN reaction causing immediate rash, facial/tongue/throat swelling, SOB or lightheadedness with hypotension: no Has patient had a PCN reaction causing severe rash involving mucus membranes or skin necrosis: no Has patient had a PCN reaction that required hospitalization no Has patient had a  PCN reaction occurring within the last 10 years: yes If all of the above answers are "NO", then may proceed with Cephalosporin use.   Clopidogrel & Aspirin Itching   Amlodipine Nausea Only    Dizzy, heavy feeling, tongue burning   Atorvastatin Other (See Comments)    Myalgia   Crestor [Rosuvastatin] Itching   Metoprolol Other (See Comments)    hair loss   Pravastatin Itching   Repatha [Evolocumab] Itching    Current Outpatient Medications on File Prior to Visit  Medication Sig Dispense Refill   aspirin 81 MG chewable tablet Chew by mouth daily.     colchicine 0.6 MG tablet TAKE 1 TABLET BY MOUTH TWICE A DAY 180 tablet 3   Cranberry-Vitamin C 250-60 MG CAPS Take 2 tablets by mouth daily.     D-MANNOSE PO Take 2 tablets by mouth daily.     diphenhydrAMINE (BENADRYL) 25 MG tablet Take 25 mg by mouth at bedtime.     FIBER PO Take 2 tablets by mouth daily.     linaclotide (LINZESS) 290 MCG CAPS capsule Take 1 capsule (290 mcg total) by mouth daily before breakfast. 90 capsule 5   nitrofurantoin, macrocrystal-monohydrate, (MACROBID) 100 MG capsule Take 1 capsule (100 mg total) by mouth at bedtime as needed (after intercourse). 90 capsule 3   NON FORMULARY Take 0.5 tablets by mouth at bedtime. CBD oil  - for pain Gummies     pantoprazole (PROTONIX) 40 MG tablet TAKE 1 TABLET BY MOUTH EVERY DAY 90 tablet 1   Polyethyl Glycol-Propyl Glycol (SYSTANE OP) Place 1 drop into both eyes in the morning and at bedtime.     PREMARIN vaginal cream Place 9.83 Applicatorfuls vaginally See admin instructions. Insert 0.5 grams vaginally every other night 30 g 12   rosuvastatin (CRESTOR) 10 MG tablet Take 1 tablet (10 mg total) by mouth daily. 90 tablet 3   ezetimibe (ZETIA) 10 MG tablet Take 1 tablet (10 mg total) by mouth daily. 90 tablet 3   No current facility-administered medications on file prior to visit.    BP 134/82   Pulse 82   Resp 18   Ht '5\' 3"'$  (1.6 m)   Wt 100 lb 9.6 oz (45.6 kg)   LMP  (LMP Unknown)   SpO2 98%   BMI 17.82 kg/m        Objective:   Physical Exam  General- No acute distress. Pleasant patient. Neck- Full range of motion, no jvd Lungs- Clear, even and unlabored. Heart- regular rate and rhythm. Neurologic- CNII- XII grossly intact.       Assessment & Plan:   Patient Instructions  Glucosuria on recent UA with gyn office and mild sugar elevation on past labs.   Osteoporosis- rx prolia to your pharmacy. Then we will give injection here. Let me know if issues with your pharmacy.   Mediterranean fever. Refilling your colchicine.   Follow up date to be determined after lab review.      Mackie Pai, PA-C

## 2022-07-16 ENCOUNTER — Other Ambulatory Visit: Payer: Self-pay | Admitting: Cardiovascular Disease

## 2022-07-16 MED ORDER — SODIUM POLYSTYRENE SULFONATE 15 GM/60ML PO SUSP: ORAL | 0 refills | Status: DC

## 2022-07-16 NOTE — Addendum Note (Signed)
Addended by: Anabel Halon on: 07/16/2022 12:18 PM   Modules accepted: Orders

## 2022-07-16 NOTE — Addendum Note (Signed)
Addended by: Anabel Halon on: 07/16/2022 12:16 PM   Modules accepted: Orders

## 2022-07-24 ENCOUNTER — Other Ambulatory Visit: Payer: Self-pay | Admitting: Medical

## 2022-08-04 ENCOUNTER — Ambulatory Visit: Payer: Medicare Other | Admitting: Medical

## 2022-08-05 ENCOUNTER — Telehealth: Payer: Self-pay

## 2022-08-05 NOTE — Telephone Encounter (Signed)
Called the pt to let her know that her Prolia has arrived and she can now be scheduled to receive injection ($0.00).   Please schedule once she calls back.

## 2022-08-07 ENCOUNTER — Ambulatory Visit (INDEPENDENT_AMBULATORY_CARE_PROVIDER_SITE_OTHER): Payer: Medicare Other

## 2022-08-07 DIAGNOSIS — M810 Age-related osteoporosis without current pathological fracture: Secondary | ICD-10-CM | POA: Diagnosis not present

## 2022-08-07 MED ORDER — DENOSUMAB 60 MG/ML ~~LOC~~ SOSY
60.0000 mg | PREFILLED_SYRINGE | Freq: Once | SUBCUTANEOUS | Status: AC
  Administered 2022-08-07: 60 mg via SUBCUTANEOUS

## 2022-08-07 NOTE — Progress Notes (Addendum)
Pt here today for Prolia injection per [name].   Prolia '60mg'$ /mL injected into L arm SUBQ. Pt supplied injection from pharmacy. Pt tolerated injection well.    Next in 6 months.   Mackie Pai, PA-C

## 2022-08-28 ENCOUNTER — Telehealth: Payer: Self-pay

## 2022-08-28 ENCOUNTER — Ambulatory Visit: Payer: Medicare Other | Admitting: Medical

## 2022-08-28 ENCOUNTER — Encounter: Payer: Self-pay | Admitting: Medical

## 2022-08-28 VITALS — BP 118/78 | HR 74 | Temp 97.8°F | Resp 18 | Ht 63.0 in | Wt 100.0 lb

## 2022-08-28 DIAGNOSIS — K219 Gastro-esophageal reflux disease without esophagitis: Secondary | ICD-10-CM | POA: Diagnosis not present

## 2022-08-28 DIAGNOSIS — D649 Anemia, unspecified: Secondary | ICD-10-CM

## 2022-08-28 DIAGNOSIS — E785 Hyperlipidemia, unspecified: Secondary | ICD-10-CM

## 2022-08-28 DIAGNOSIS — R300 Dysuria: Secondary | ICD-10-CM

## 2022-08-28 DIAGNOSIS — D582 Other hemoglobinopathies: Secondary | ICD-10-CM

## 2022-08-28 DIAGNOSIS — R1013 Epigastric pain: Secondary | ICD-10-CM | POA: Diagnosis not present

## 2022-08-28 DIAGNOSIS — R7989 Other specified abnormal findings of blood chemistry: Secondary | ICD-10-CM

## 2022-08-28 LAB — CBC WITH DIFFERENTIAL/PLATELET
Basophils Absolute: 0.1 10*3/uL (ref 0.0–0.1)
Basophils Relative: 0.8 % (ref 0.0–3.0)
Eosinophils Absolute: 0.3 10*3/uL (ref 0.0–0.7)
Eosinophils Relative: 2.5 % (ref 0.0–5.0)
HCT: 55.6 % — ABNORMAL HIGH (ref 36.0–46.0)
Hemoglobin: 18 g/dL (ref 12.0–15.0)
Lymphocytes Relative: 18.1 % (ref 12.0–46.0)
Lymphs Abs: 1.8 10*3/uL (ref 0.7–4.0)
MCHC: 32.1 g/dL (ref 30.0–36.0)
MCV: 80 fl (ref 78.0–100.0)
Monocytes Absolute: 0.5 10*3/uL (ref 0.1–1.0)
Monocytes Relative: 5 % (ref 3.0–12.0)
Neutro Abs: 7.4 10*3/uL (ref 1.4–7.7)
Neutrophils Relative %: 73.6 % (ref 43.0–77.0)
Platelets: 434 10*3/uL — ABNORMAL HIGH (ref 150.0–400.0)
RBC: 6.99 Mil/uL — ABNORMAL HIGH (ref 3.87–5.11)
RDW: 18.3 % — ABNORMAL HIGH (ref 11.5–15.5)
WBC: 10.1 10*3/uL (ref 4.0–10.5)

## 2022-08-28 LAB — POC URINALSYSI DIPSTICK (AUTOMATED)
Bilirubin, UA: NEGATIVE
Blood, UA: NEGATIVE
Glucose, UA: POSITIVE — AB
Ketones, UA: NEGATIVE
Leukocytes, UA: NEGATIVE
Nitrite, UA: NEGATIVE
Protein, UA: NEGATIVE
Spec Grav, UA: 1.01 (ref 1.010–1.025)
Urobilinogen, UA: 0.2 E.U./dL
pH, UA: 6 (ref 5.0–8.0)

## 2022-08-28 LAB — COMPREHENSIVE METABOLIC PANEL
ALT: 16 U/L (ref 0–35)
AST: 22 U/L (ref 0–37)
Albumin: 4.2 g/dL (ref 3.5–5.2)
Alkaline Phosphatase: 83 U/L (ref 39–117)
BUN: 9 mg/dL (ref 6–23)
CO2: 31 mEq/L (ref 19–32)
Calcium: 9.2 mg/dL (ref 8.4–10.5)
Chloride: 100 mEq/L (ref 96–112)
Creatinine, Ser: 0.75 mg/dL (ref 0.40–1.20)
GFR: 83.49 mL/min (ref >60.00)
Glucose, Bld: 78 mg/dL (ref 70–99)
Potassium: 5 mEq/L (ref 3.5–5.1)
Sodium: 137 mEq/L (ref 135–145)
Total Bilirubin: 0.7 mg/dL (ref 0.2–1.2)
Total Protein: 7.2 g/dL (ref 6.0–8.3)

## 2022-08-28 LAB — LIPID PANEL
Cholesterol: 98 mg/dL (ref 0–200)
HDL: 32.9 mg/dL — ABNORMAL LOW (ref >39.00)
LDL Cholesterol: 43 mg/dL (ref 0–99)
NonHDL: 64.78
Total CHOL/HDL Ratio: 3
Triglycerides: 110 mg/dL (ref 0.0–149.0)
VLDL: 22 mg/dL (ref 0.0–40.0)

## 2022-08-28 LAB — IRON: Iron: 38 ug/dL — ABNORMAL LOW (ref 42–145)

## 2022-08-28 MED ORDER — LEVOFLOXACIN 500 MG PO TABS: 500.0000 mg | ORAL_TABLET | Freq: Every day | ORAL | 0 refills | Status: AC

## 2022-08-28 MED ORDER — DICLOFENAC SODIUM 75 MG PO TBEC: 75.0000 mg | DELAYED_RELEASE_TABLET | Freq: Two times a day (BID) | ORAL | 0 refills | Status: DC

## 2022-08-28 MED ORDER — FOSFOMYCIN TROMETHAMINE 3 G PO PACK: 3.0000 g | PACK | Freq: Once | ORAL | 0 refills | Status: AC

## 2022-08-28 MED ORDER — OMEPRAZOLE 40 MG PO CPDR: 40.0000 mg | DELAYED_RELEASE_CAPSULE | Freq: Every day | ORAL | 3 refills | Status: DC

## 2022-08-28 NOTE — Addendum Note (Signed)
Addended by: Anabel Halon on: 08/28/2022 07:21 PM   Modules accepted: Orders

## 2022-08-28 NOTE — Patient Instructions (Addendum)
   1. Gastroesophageal reflux disease, unspecified whether esophagitis present Recommend strict healthy diet.  Avoid fried foods, chocolates and alcohol.  Continue omeprazole presently.  Discontinue pantoprazole as you report omeprazole seems to be helping a lot more.  If you find omeprazole alone is not adequate you could not get famotidine/Pepcid over-the-counter and use 1 tablet daily as well.  Please go ahead and call GI MD office and I think they might proceed with EGD.  - Ambulatory referral to Gastroenterology - Comp Met (CMET) - Lipid panel - Iron - CBC w/Diff - Lipase  2. Hyperlipidemia, unspecified hyperlipidemia type Continue current medication prescribed by cardiologist. - Comp Met (CMET) - Lipid panel  3 Dysuria Currently early onset of symptoms.  Macrobid causing GI side effects.  Various other antibiotics that I attempted to rx cmputer indicates possible allergic reaction since you apparently had reaction to aspirin and Plavix in the past?.  This came up with quinolones and Bactrim.  Allergic to penicillin and therefore did not prescribe cephalosporin.  Also making fosfomycin available to use if your symptoms worsen pending urine culture results. - POCT Urinalysis Dipstick (Automated) - Urine Culture  4. Anemia, unspecified type Will follow CBC and your iron level.  Follow-up in 2 weeks or sooner if needed.

## 2022-08-28 NOTE — Telephone Encounter (Signed)
CRITICAL VALUE STICKER  CRITICAL VALUE: Hgb- 18.0  RECEIVER (on-site recipient of call): Kristine Garbe, [place] NOTIFIED: 08/28/22, 4:06PM  MESSENGER (representative from lab): Santiago Glad, lab  MD NOTIFIED: yes  TIME OF NOTIFICATION: 08/28/22, 4:06PM  RESPONSE:  pending

## 2022-08-28 NOTE — Progress Notes (Signed)
Subjective:    Patient ID: Katrina Walters, female    DOB: [birth year], 66 y.o.   MRN: [id number]  HPI  Pt in stating recent upset stomach. Pt states when she eats feels like discomfort in stomach up to her throat. States constant burping recently. She states took omeprazole and seemed to help more than her pantoprazole. Omeprazole 40 mg dose and states beginning to feel better.  No black stools. No bloody stools. Never had egd. On review gi sympoms since nov 2022. See that note.  Also pt reports recent fatigue. Since last visit.   Slight burning on urination for 2 days. No other uti symptoms. Hx of uti frequently in past. On macorbid preventatively.    Review of Systems  Constitutional:  Negative for chills, fatigue and fever.  Respiratory:  Negative for cough, chest tightness, shortness of breath and wheezing.   Cardiovascular:  Negative for chest pain and palpitations.  Gastrointestinal:  Positive for abdominal pain. Negative for diarrhea and nausea.  Genitourinary:  Positive for dysuria. Negative for frequency and hematuria.  Musculoskeletal:  Negative for back pain, myalgias and neck stiffness.  Neurological:  Negative for dizziness, light-headedness and headaches.  Hematological:  Negative for adenopathy. Does not bruise/bleed easily.  Psychiatric/Behavioral:  Negative for behavioral problems and confusion.    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   PONV (postoperative nausea and vomiting)      Social History   Socioeconomic History   Marital status: Married    Spouse name: Not on file   Number of children: Not on file   Years of education: Not on file   Highest education level: Not on file  Occupational History   Occupation: unemployed at the time  Tobacco Use   Smoking  status: Never   Smokeless tobacco: Never  Vaping Use   Vaping Use: Never used  Substance and Sexual Activity   Alcohol use: Not Currently    Alcohol/week: 0.0 standard drinks of alcohol    Comment: occassionally   Drug use: No    Comment: CBD gummies   Sexual activity: Yes  Other Topics Concern   Not on file  Social History Narrative   Not on file   Social Determinants of Health   Financial Resource Strain: Medium Risk (12/24/2021)   Overall Financial Resource Strain (CARDIA)    Difficulty of Paying Living Expenses: Somewhat hard  Food Insecurity: Food Insecurity Present (12/24/2021)   Hunger Vital Sign    Worried About Running Out of Food in the Last Year: Sometimes true    Ran Out of Food in the Last Year: Never true  Transportation Needs: No Transportation Needs (12/24/2021)   PRAPARE - Hydrologist (Medical): No    Lack of Transportation (Non-Medical): No  Physical Activity: Insufficiently Active (12/24/2021)   Exercise Vital Sign    Days of Exercise per Week: 3 days    Minutes of Exercise per Session: 30 min  Stress: Stress Concern Present (12/24/2021)   [place]    Feeling of Stress : To some extent  Social Connections: Moderately Isolated (12/24/2021)   Social Connection and Isolation Panel [NHANES]    Frequency of Communication with Friends and  Family: More than three times a week    Frequency of Social Gatherings with Friends and Family: Once a week    Attends Religious Services: Never    Marine scientist or Organizations: No    Attends Archivist Meetings: Never    Marital Status: Married  Human resources officer Violence: Not At Risk (12/24/2021)   Humiliation, Afraid, Rape, and Kick questionnaire    Fear of Current or Ex-Partner: No    Emotionally Abused: No    Physically Abused: No    Sexually Abused: No    Past Surgical History:  Procedure Laterality  Date   ABDOMINAL HYSTERECTOMY  08/2019   ANAL RECTAL MANOMETRY N/A 11/12/2020   Procedure: ANO RECTAL MANOMETRY;  Surgeon: Lavena Bullion, DO;  Location: [facility];  Service: Gastroenterology;  Laterality: N/A;   BLADDER SURGERY  08/2019   BREAST REDUCTION SURGERY  2006   CARDIAC CATHETERIZATION N/A 07/17/2016   Procedure: Left Heart Cath and Coronary Angiography;  Surgeon: Burnell Blanks, MD;  Location: [facility];  Service: Cardiovascular;  Laterality: N/A;   CATARACT EXTRACTION W/ INTRAOCULAR LENS IMPLANT Bilateral    COLONOSCOPY  2015   CORONARY ARTERY BYPASS GRAFT N/A 06/04/2021   Procedure: CORONARY ARTERY BYPASS GRAFTING (CABG), ON PUMP, TIMES FOUR, USING LEFT INTERNAL MAMMARY ARTERY, LEFT RADIAL ARTERY (HARVESTED OPEN), AND RIGHT GREATER SAPHENOUS VEIN (HARVESTED ENDOSCOPICALLY);  Surgeon: Lajuana Matte, MD;  Location: [facility];  Service: Open Heart Surgery;  Laterality: N/A;   EYE SURGERY     LEFT HEART CATH AND CORONARY ANGIOGRAPHY N/A 04/22/2021   Procedure: LEFT HEART CATH AND CORONARY ANGIOGRAPHY;  Surgeon: Belva Crome, MD;  Location: [facility];  Service: Cardiovascular;  Laterality: N/A;   POLYPECTOMY  2015   RADIAL ARTERY HARVEST Left 06/04/2021   Procedure: RADIAL ARTERY HARVEST;  Surgeon: Lajuana Matte, MD;  Location: [facility];  Service: Open Heart Surgery;  Laterality: Left;   TEE WITHOUT CARDIOVERSION N/A 06/04/2021   Procedure: TRANSESOPHAGEAL ECHOCARDIOGRAM (TEE);  Surgeon: Lajuana Matte, MD;  Location: [facility];  Service: Open Heart Surgery;  Laterality: N/A;   TONSILLECTOMY      Family History  Problem Relation Age of Onset   Heart disease Mother        died at 64 of heart attack   Hypertension Mother    Heart attack Mother    Heart disease Father        had heart attack at age 12   Heart attack Father    Rectal cancer Father 84   Cancer Paternal Aunt    Stomach cancer Paternal Grandmother    Colon cancer Neg Hx     Colon polyps Neg Hx    Esophageal cancer Neg Hx     Allergies  Allergen Reactions   Amoxicillin     Had fever with flu like symptoms. Has patient had a PCN reaction causing immediate rash, facial/tongue/throat swelling, SOB or lightheadedness with hypotension: no Has patient had a PCN reaction causing severe rash involving mucus membranes or skin necrosis: no Has patient had a PCN reaction that required hospitalization no Has patient had a PCN reaction occurring within the last 10 years: yes If all of the above answers are "NO", then may proceed with Cephalosporin use.   Clopidogrel & Aspirin Itching   Amlodipine Nausea Only    Dizzy, heavy feeling, tongue burning   Atorvastatin Other (See Comments)    Myalgia   Crestor [  Rosuvastatin] Itching   Metoprolol Other (See Comments)    hair loss   Pravastatin Itching   Repatha [Evolocumab] Itching    Current Outpatient Medications on File Prior to Visit  Medication Sig Dispense Refill   aspirin 81 MG chewable tablet Chew by mouth daily.     colchicine 0.6 MG tablet Take 1 tablet (0.6 mg total) by mouth 2 (two) times daily. 180 tablet 3   Cranberry-Vitamin C 250-60 MG CAPS Take 2 tablets by mouth daily.     D-MANNOSE PO Take 2 tablets by mouth daily.     diphenhydrAMINE (BENADRYL) 25 MG tablet Take 25 mg by mouth at bedtime.     ezetimibe (ZETIA) 10 MG tablet Take 1 tablet (10 mg total) by mouth daily. Please keep scheduled appointment for additional refills. 90 tablet 1   FIBER PO Take 2 tablets by mouth daily.     linaclotide (LINZESS) 290 MCG CAPS capsule Take 1 capsule (290 mcg total) by mouth daily before breakfast. 90 capsule 5   nitrofurantoin, macrocrystal-monohydrate, (MACROBID) 100 MG capsule Take 1 capsule (100 mg total) by mouth at bedtime as needed (after intercourse). 90 capsule 3   NON FORMULARY Take 0.5 tablets by mouth at bedtime. CBD oil - for pain Gummies     pantoprazole (PROTONIX) 40 MG tablet TAKE 1 TABLET BY MOUTH  EVERY DAY 90 tablet 1   Polyethyl Glycol-Propyl Glycol (SYSTANE OP) Place 1 drop into both eyes in the morning and at bedtime.     PREMARIN vaginal cream PLACE 0.35 APPLICATORFULS VAGINALLY SEE ADMIN INSTRUCTIONS. INSERT 0.5 GRAMS VAGINALLY EVERY OTHER NIGHT 30 g 12   rosuvastatin (CRESTOR) 10 MG tablet Take 1 tablet (10 mg total) by mouth daily. 90 tablet 3   sodium polystyrene (KAYEXALATE) 15 GM/60ML suspension 60 ml po x 4 days 240 mL 0   No current facility-administered medications on file prior to visit.    BP 118/78   Pulse 74   Temp 97.8 F (36.6 C)   Resp 18   Ht _0  (1.6 m)   Wt 100 lb (45.4 kg)   LMP  (LMP Unknown)   SpO2 99%   BMI 17.71 kg/m        Objective:   Physical Exam  General- No acute distress. Pleasant patient. Neck- Full range of motion, no jvd Lungs- Clear, even and unlabored. Heart- regular rate and rhythm. Neurologic- CNII- XII grossly intact.  Abdomen- soft, nt, nd, +bs, no rebound or guarding. Back- no cva tendernss.      Assessment & Plan:   Patient Instructions    1. Gastroesophageal reflux disease, unspecified whether esophagitis present Recommend strict healthy diet.  Avoid fried foods, chocolates and alcohol.  Continue omeprazole presently.  Discontinue pantoprazole as you report omeprazole seems to be helping a lot more.  If you find omeprazole alone is not adequate you could not get famotidine/Pepcid over-the-counter and use 1 tablet daily as well.  Please go ahead and call GI MD office and I think they might proceed with EGD.  - Ambulatory referral to Gastroenterology - Comp Met (CMET) - Lipid panel - Iron - CBC w/Diff - Lipase  2. Hyperlipidemia, unspecified hyperlipidemia type Continue current medication prescribed by cardiologist. - Comp Met (CMET) - Lipid panel  3 Dysuria Currently early onset of symptoms.  Macrobid causing GI side effects.  Various other antibiotics that I attempted to rx cmputer indicates possible  allergic reaction since you apparently had reaction to aspirin and Plavix in  the past?.  This came up with quinolones and Bactrim.  Allergic to penicillin and therefore did not prescribe cephalosporin.  Also making fosfomycin available to use if your symptoms worsen pending urine culture results. - POCT Urinalysis Dipstick (Automated) - Urine Culture  4. Anemia, unspecified type Will follow CBC and your iron level.  Follow-up in 2 weeks or sooner if needed.   Mackie Pai, PA-C

## 2022-08-28 NOTE — Addendum Note (Signed)
Addended by: Anabel Halon on: 08/28/2022 12:07 PM   Modules accepted: Orders

## 2022-08-29 ENCOUNTER — Encounter: Payer: Self-pay | Admitting: Medical

## 2022-08-29 LAB — LIPASE: Lipase: 18 U/L (ref 11.0–59.0)

## 2022-08-29 NOTE — Telephone Encounter (Signed)
Lab appt scheduled.

## 2022-08-31 LAB — URINE CULTURE
MICRO NUMBER:: 14345525
SPECIMEN QUALITY:: ADEQUATE

## 2022-09-01 MED ORDER — BENZONATATE 100 MG PO CAPS: 100.0000 mg | ORAL_CAPSULE | Freq: Three times a day (TID) | ORAL | 0 refills | Status: DC | PRN

## 2022-09-01 NOTE — Addendum Note (Signed)
Addended by: Anabel Halon on: 09/01/2022 08:56 AM   Modules accepted: Orders

## 2022-09-01 NOTE — Addendum Note (Signed)
Addended by: Anabel Halon on: 09/01/2022 07:53 AM   Modules accepted: Orders

## 2022-09-05 ENCOUNTER — Other Ambulatory Visit (INDEPENDENT_AMBULATORY_CARE_PROVIDER_SITE_OTHER): Payer: Medicare Other

## 2022-09-05 DIAGNOSIS — R7989 Other specified abnormal findings of blood chemistry: Secondary | ICD-10-CM | POA: Diagnosis not present

## 2022-09-05 DIAGNOSIS — E875 Hyperkalemia: Secondary | ICD-10-CM | POA: Diagnosis not present

## 2022-09-05 LAB — CBC WITH DIFFERENTIAL/PLATELET
Basophils Absolute: 0.1 10*3/uL (ref 0.0–0.1)
Basophils Relative: 0.6 % (ref 0.0–3.0)
Eosinophils Absolute: 0.3 10*3/uL (ref 0.0–0.7)
Eosinophils Relative: 2.7 % (ref 0.0–5.0)
HCT: 54.3 % — ABNORMAL HIGH (ref 36.0–46.0)
Hemoglobin: 17.6 g/dL — ABNORMAL HIGH (ref 12.0–15.0)
Lymphocytes Relative: 15.7 % (ref 12.0–46.0)
Lymphs Abs: 1.6 10*3/uL (ref 0.7–4.0)
MCHC: 32.4 g/dL (ref 30.0–36.0)
MCV: 78.6 fl (ref 78.0–100.0)
Monocytes Absolute: 0.6 10*3/uL (ref 0.1–1.0)
Monocytes Relative: 5.5 % (ref 3.0–12.0)
Neutro Abs: 7.9 10*3/uL — ABNORMAL HIGH (ref 1.4–7.7)
Neutrophils Relative %: 75.5 % (ref 43.0–77.0)
Platelets: 405 10*3/uL — ABNORMAL HIGH (ref 150.0–400.0)
RBC: 6.91 Mil/uL — ABNORMAL HIGH (ref 3.87–5.11)
RDW: 18.1 % — ABNORMAL HIGH (ref 11.5–15.5)
WBC: 10.5 10*3/uL (ref 4.0–10.5)

## 2022-09-05 LAB — COMPREHENSIVE METABOLIC PANEL
ALT: 19 U/L (ref 0–35)
AST: 21 U/L (ref 0–37)
Albumin: 4.1 g/dL (ref 3.5–5.2)
Alkaline Phosphatase: 71 U/L (ref 39–117)
BUN: 13 mg/dL (ref 6–23)
CO2: 32 mEq/L (ref 19–32)
Calcium: 9.4 mg/dL (ref 8.4–10.5)
Chloride: 99 mEq/L (ref 96–112)
Creatinine, Ser: 0.88 mg/dL (ref 0.40–1.20)
GFR: 68.9 mL/min (ref >60.00)
Glucose, Bld: 73 mg/dL (ref 70–99)
Potassium: 4.4 mEq/L (ref 3.5–5.1)
Sodium: 139 mEq/L (ref 135–145)
Total Bilirubin: 0.9 mg/dL (ref 0.2–1.2)
Total Protein: 7 g/dL (ref 6.0–8.3)

## 2022-09-06 NOTE — Addendum Note (Signed)
Addended by: Anabel Halon on: 09/06/2022 07:40 AM   Modules accepted: Orders

## 2022-09-11 ENCOUNTER — Encounter: Payer: Self-pay | Admitting: Medical

## 2022-09-16 ENCOUNTER — Inpatient Hospital Stay: Payer: Medicare Other | Attending: Hematology & Oncology

## 2022-09-16 ENCOUNTER — Inpatient Hospital Stay (HOSPITAL_BASED_OUTPATIENT_CLINIC_OR_DEPARTMENT_OTHER): Payer: Medicare Other | Admitting: Family

## 2022-09-16 ENCOUNTER — Encounter: Payer: Self-pay | Admitting: Family

## 2022-09-16 VITALS — BP 126/86 | HR 72 | Temp 98.1°F | Resp 17 | Wt 101.4 lb

## 2022-09-16 DIAGNOSIS — K922 Gastrointestinal hemorrhage, unspecified: Secondary | ICD-10-CM | POA: Diagnosis not present

## 2022-09-16 DIAGNOSIS — D45 Polycythemia vera: Secondary | ICD-10-CM | POA: Diagnosis not present

## 2022-09-16 DIAGNOSIS — D500 Iron deficiency anemia secondary to blood loss (chronic): Secondary | ICD-10-CM | POA: Diagnosis not present

## 2022-09-16 DIAGNOSIS — D751 Secondary polycythemia: Secondary | ICD-10-CM

## 2022-09-16 DIAGNOSIS — K648 Other hemorrhoids: Secondary | ICD-10-CM | POA: Diagnosis not present

## 2022-09-16 DIAGNOSIS — D509 Iron deficiency anemia, unspecified: Secondary | ICD-10-CM

## 2022-09-16 LAB — CBC WITH DIFFERENTIAL (CANCER CENTER ONLY)
Abs Immature Granulocytes: 0.07 10*3/uL (ref 0.00–0.07)
Basophils Absolute: 0.1 10*3/uL (ref 0.0–0.1)
Basophils Relative: 1 %
Eosinophils Absolute: 0.3 10*3/uL (ref 0.0–0.5)
Eosinophils Relative: 3 %
HCT: 58 % — ABNORMAL HIGH (ref 36.0–46.0)
Hemoglobin: 17.8 g/dL — ABNORMAL HIGH (ref 12.0–15.0)
Immature Granulocytes: 1 %
Lymphocytes Relative: 19 %
Lymphs Abs: 2.2 10*3/uL (ref 0.7–4.0)
MCH: 24.7 pg — ABNORMAL LOW (ref 26.0–34.0)
MCHC: 30.7 g/dL (ref 30.0–36.0)
MCV: 80.3 fL (ref 80.0–100.0)
Monocytes Absolute: 0.7 10*3/uL (ref 0.1–1.0)
Monocytes Relative: 6 %
Neutro Abs: 8.4 10*3/uL — ABNORMAL HIGH (ref 1.7–7.7)
Neutrophils Relative %: 70 %
Platelet Count: 441 10*3/uL — ABNORMAL HIGH (ref 150–400)
RBC: 7.22 MIL/uL — ABNORMAL HIGH (ref 3.87–5.11)
RDW: 18.4 % — ABNORMAL HIGH (ref 11.5–15.5)
WBC Count: 11.8 10*3/uL — ABNORMAL HIGH (ref 4.0–10.5)
nRBC: 0 % (ref 0.0–0.2)

## 2022-09-16 LAB — RETICULOCYTES
Immature Retic Fract: 18.6 % — ABNORMAL HIGH (ref 2.3–15.9)
RBC.: 7.05 MIL/uL — ABNORMAL HIGH (ref 3.87–5.11)
Retic Count, Absolute: 96.6 10*3/uL (ref 19.0–186.0)
Retic Ct Pct: 1.4 % (ref 0.4–3.1)

## 2022-09-16 LAB — FERRITIN: Ferritin: 10 ng/mL — ABNORMAL LOW (ref 11–307)

## 2022-09-16 NOTE — Progress Notes (Signed)
Hematology and Oncology Follow Up Visit  Katrina Walters [id number] 08-01-1956 67 y.o. 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 Her Hgb remains elevated along with mildly elevated WBC and platelet counts.  She notes fatigue and abdominal bloating that comes and goes.  No issue with infections. No fever, chills, n/v, cough, rash, dizziness, SOB, chest pain, palpitations, abdominal pain or changes in bowel or bladder habits.  No swelling, tenderness, numbness or tingling in her extremities at this time.  No falls or syncope.  Appetite and hydration are good. Weight is stable at 101 lbs.   ECOG Performance Status: 1 - Symptomatic but completely ambulatory  Medications:  Allergies as of 09/16/2022       Reactions   Amoxicillin    Had fever with flu like symptoms. Has patient had a PCN reaction causing immediate rash, facial/tongue/throat swelling, SOB or lightheadedness with hypotension: no Has patient had a PCN reaction causing severe rash involving mucus membranes or skin necrosis: no Has patient had a PCN reaction that required hospitalization no Has patient had a PCN reaction occurring within the last 10 years: yes If all of the above answers are "NO", then may proceed with Cephalosporin use.   Clopidogrel & Aspirin Itching   Amlodipine Nausea Only   Dizzy, heavy feeling, tongue burning   Atorvastatin Other (See Comments)   Myalgia   Crestor [rosuvastatin] Itching   Metoprolol Other (See Comments)   hair loss   Pravastatin Itching   Repatha [evolocumab] Itching        Medication List        Accurate as of September 16, 2022  2:25 PM. If you have any questions, ask your nurse or doctor.          aspirin 81 MG chewable tablet Chew by mouth daily.   benzonatate 100 MG  capsule Commonly known as: TESSALON Take 1 capsule (100 mg total) by mouth 3 (three) times daily as needed for cough.   colchicine 0.6 MG tablet Take 1 tablet (0.6 mg total) by mouth 2 (two) times daily.   Cranberry-Vitamin C 250-60 MG Caps Take 2 tablets by mouth daily.   D-MANNOSE PO Take 2 tablets by mouth daily.   diclofenac 75 MG EC tablet Commonly known as: VOLTAREN Take 1 tablet (75 mg total) by mouth 2 (two) times daily.   diphenhydrAMINE 25 MG tablet Commonly known as: BENADRYL Take 25 mg by mouth at bedtime.   ezetimibe 10 MG tablet Commonly known as: ZETIA Take 1 tablet (10 mg total) by mouth daily. Please keep scheduled appointment for additional refills.   FIBER PO Take 2 tablets by mouth daily.   linaclotide 290 MCG Caps capsule Commonly known as: LINZESS Take 1 capsule (290 mcg total) by mouth daily before breakfast.   nitrofurantoin (macrocrystal-monohydrate) 100 MG capsule Commonly known as: MACROBID Take 1 capsule (100 mg total) by mouth at bedtime as needed (after intercourse).   NON FORMULARY Take 0.5 tablets by mouth at bedtime. CBD oil - for pain Gummies   omeprazole 40 MG capsule Commonly known as: PRILOSEC Take 1 capsule (40 mg total) by mouth daily.   pantoprazole 40 MG tablet Commonly known as: PROTONIX TAKE 1 TABLET BY MOUTH EVERY DAY   Premarin vaginal cream Generic drug: conjugated  estrogens PLACE 3.29 APPLICATORFULS VAGINALLY SEE ADMIN INSTRUCTIONS. INSERT 0.5 GRAMS VAGINALLY EVERY OTHER NIGHT   rosuvastatin 10 MG tablet Commonly known as: CRESTOR Take 1 tablet (10 mg total) by mouth daily.   sodium polystyrene 15 GM/60ML suspension Commonly known as: KAYEXALATE 60 ml po x 4 days   SYSTANE OP Place 1 drop into both eyes in the morning and at bedtime.        Allergies:  Allergies  Allergen Reactions   Amoxicillin     Had fever with flu like symptoms. Has patient had a PCN reaction causing immediate rash,  facial/tongue/throat swelling, SOB or lightheadedness with hypotension: no Has patient had a PCN reaction causing severe rash involving mucus membranes or skin necrosis: no Has patient had a PCN reaction that required hospitalization no Has patient had a PCN reaction occurring within the last 10 years: yes If all of the above answers are "NO", then may proceed with Cephalosporin use.   Clopidogrel & Aspirin Itching   Amlodipine Nausea Only    Dizzy, heavy feeling, tongue burning   Atorvastatin Other (See Comments)    Myalgia   Crestor [Rosuvastatin] Itching   Metoprolol Other (See Comments)    hair loss   Pravastatin Itching   Repatha [Evolocumab] Itching    Past Medical History, Surgical history, Social history, and Family History were reviewed and updated.  Review of Systems: All other 10 point review of systems is negative.   Physical Exam:  weight is 101 lb 6.4 oz (46 kg). Her oral temperature is 98.1 F (36.7 C). Her blood pressure is 126/86 and her pulse is 72. Her respiration is 17 and oxygen saturation is 99%.   Wt Readings from Last 3 Encounters:  09/16/22 101 lb 6.4 oz (46 kg)  08/28/22 100 lb (45.4 kg)  07/14/22 100 lb 9.6 oz (45.6 kg)    Ocular: Sclerae unicteric, pupils equal, round and reactive to light Ear-nose-throat: Oropharynx clear, dentition fair Lymphatic: No cervical or supraclavicular adenopathy Lungs no rales or rhonchi, good excursion bilaterally Heart regular rate and rhythm, no murmur appreciated Abd soft, nontender, positive bowel sounds MSK no focal spinal tenderness, no joint edema Neuro: non-focal, well-oriented, appropriate affect Breasts: Deferred   Lab Results  Component Value Date   WBC 11.8 (H) 09/16/2022   HGB 17.8 (H) 09/16/2022   HCT 58.0 (H) 09/16/2022   MCV 80.3 09/16/2022   PLT 441 (H) 09/16/2022   Lab Results  Component Value Date   FERRITIN 11 02/10/2022   IRON 38 (L) 08/28/2022   TIBC 435 02/10/2022   UIBC 391  02/10/2022   IRONPCTSAT 10 (L) 02/10/2022   Lab Results  Component Value Date   RETICCTPCT 1.4 09/16/2022   RBC 7.22 (H) 09/16/2022   RBC 7.05 (H) 09/16/2022   No results found for: "KPAFRELGTCHN", "LAMBDASER", "KAPLAMBRATIO" No results found for: "IGGSERUM", "IGA", "IGMSERUM" No results found for: "TOTALPROTELP", "ALBUMINELP", "A1GS", "A2GS", "BETS", "BETA2SER", "GAMS", "MSPIKE", "SPEI"   Chemistry      Component Value Date/Time   NA 139 09/05/2022 1030   NA 139 04/15/2021 1027   K 4.4 09/05/2022 1030   CL 99 09/05/2022 1030   CO2 32 09/05/2022 1030   BUN 13 09/05/2022 1030   BUN 14 04/15/2021 1027   CREATININE 0.88 09/05/2022 1030   CREATININE 0.79 09/26/2021 1005   CREATININE 0.84 07/14/2016 0914      Component Value Date/Time   CALCIUM 9.4 09/05/2022 1030   ALKPHOS 71 09/05/2022 1030   AST  21 09/05/2022 1030   AST 15 09/26/2021 1005   ALT 19 09/05/2022 1030   ALT 10 09/26/2021 1005   BILITOT 0.9 09/05/2022 1030   BILITOT 0.6 09/26/2021 1005       Impression and Plan: Ms. Neyer is a very pleasant 67 yo caucasian female with iron deficiency anemia.  Iron studies and  JAK 2 pending.  Follow-up pending results.   Lottie Dawson, NP 1/9/20242:25 PM

## 2022-09-17 ENCOUNTER — Other Ambulatory Visit: Payer: Self-pay | Admitting: Medical

## 2022-09-17 LAB — IRON AND IRON BINDING CAPACITY (CC-WL,HP ONLY)
Iron: 47 ug/dL (ref 28–170)
Saturation Ratios: 12 % (ref 10.4–31.8)
TIBC: 398 ug/dL (ref 250–450)
UIBC: 351 ug/dL (ref 148–442)

## 2022-09-19 DIAGNOSIS — H04123 Dry eye syndrome of bilateral lacrimal glands: Secondary | ICD-10-CM | POA: Diagnosis not present

## 2022-09-19 DIAGNOSIS — H26492 Other secondary cataract, left eye: Secondary | ICD-10-CM | POA: Diagnosis not present

## 2022-09-19 DIAGNOSIS — Z961 Presence of intraocular lens: Secondary | ICD-10-CM | POA: Diagnosis not present

## 2022-09-19 DIAGNOSIS — H43812 Vitreous degeneration, left eye: Secondary | ICD-10-CM | POA: Diagnosis not present

## 2022-09-19 DIAGNOSIS — H353131 Nonexudative age-related macular degeneration, bilateral, early dry stage: Secondary | ICD-10-CM | POA: Diagnosis not present

## 2022-09-22 ENCOUNTER — Encounter: Payer: Self-pay | Admitting: Family

## 2022-09-24 ENCOUNTER — Telehealth: Payer: Self-pay | Admitting: Family

## 2022-09-24 ENCOUNTER — Ambulatory Visit (INDEPENDENT_AMBULATORY_CARE_PROVIDER_SITE_OTHER): Payer: Medicare Other | Admitting: Medical

## 2022-09-24 ENCOUNTER — Encounter: Payer: Self-pay | Admitting: Medical

## 2022-09-24 VITALS — BP 112/78 | HR 86 | Temp 97.9°F | Resp 18 | Ht 63.0 in | Wt 102.8 lb

## 2022-09-24 DIAGNOSIS — J0100 Acute maxillary sinusitis, unspecified: Secondary | ICD-10-CM | POA: Diagnosis not present

## 2022-09-24 DIAGNOSIS — W5503XA Scratched by cat, initial encounter: Secondary | ICD-10-CM

## 2022-09-24 LAB — JAK2 (INCLUDING V617F AND EXON 12), MPL,& CALR-NEXT GEN SEQ

## 2022-09-24 MED ORDER — FLUTICASONE PROPIONATE 50 MCG/ACT NA SUSP: 2.0000 | Freq: Every day | NASAL | 1 refills | Status: DC

## 2022-09-24 MED ORDER — BENZONATATE 100 MG PO CAPS: 100.0000 mg | ORAL_CAPSULE | Freq: Three times a day (TID) | ORAL | 0 refills | Status: DC | PRN

## 2022-09-24 MED ORDER — DOXYCYCLINE HYCLATE 100 MG PO TABS: 100.0000 mg | ORAL_TABLET | Freq: Two times a day (BID) | ORAL | 0 refills | Status: DC

## 2022-09-24 NOTE — Patient Instructions (Addendum)
Your appear to have a sinus infection following uri. I am prescribing  doxycycline antibiotic for the infection. To help with the nasal congestion I prescribed nasal steroid flonase. For your associated cough, I prescribed cough medicine benzonatate.  Cat scratch recently. Superficial and no complications. Up to date on tdap done in 2021. Counseled/advised not to feed cat as bite can be more complicated if unkown vaccine status of animal or stray  Rest, hydrate, tylenol for fever.  Follow up in 7 days or as needed.

## 2022-09-24 NOTE — Progress Notes (Signed)
Subjective:    Patient ID: Katrina Walters, female    DOB: 05-05-1956, 67 y.o.   MRN: [id number]  HPI Pt in with recent nasal congestion and sinus pressure. States symptoms for 3 days. Left side maxillary and frontal sinus pain. Subjective fever. Sone sneezing. When she blows her nose she is getting a lot of mucus.  Pt had tdap in 2021. She got scratched from cat recently.  Also she got cat scratch to her rt hand. Pt rassures only scratch and not bite. She had tdap 2021.     Review of Systems  Constitutional:  Negative for chills, fatigue and fever.  HENT:  Positive for congestion, sinus pressure and sinus pain. Negative for ear pain, hearing loss and nosebleeds.   Respiratory:  Negative for cough, chest tightness, shortness of breath and wheezing.   Cardiovascular:  Negative for chest pain and palpitations.  Gastrointestinal:  Negative for anal bleeding, nausea and vomiting.  Genitourinary:  Negative for dysuria, flank pain and frequency.  Musculoskeletal:  Negative for back pain and joint swelling.  Skin:  Negative for rash.  Neurological:  Negative for dizziness.  Hematological:  Negative for adenopathy. Does not bruise/bleed easily.  Psychiatric/Behavioral:  Negative for behavioral problems and confusion.    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 34 years in [place], managed on colchicine   PONV (postoperative nausea and vomiting)      Social History   Socioeconomic History   Marital status: Married    Spouse name: Not on file   Number of children: Not on file   Years of education: Not on file   Highest education level: Not on file  Occupational History   Occupation: unemployed at the time  Tobacco Use   Smoking status: Never   Smokeless tobacco: Never  Vaping Use   Vaping Use: Never used  Substance and Sexual  Activity   Alcohol use: Not Currently    Alcohol/week: 0.0 standard drinks of alcohol    Comment: occassionally   Drug use: No    Comment: CBD gummies   Sexual activity: Yes  Other Topics Concern   Not on file  Social History Narrative   Not on file   Social Determinants of Health   Financial Resource Strain: Medium Risk (12/24/2021)   Overall Financial Resource Strain (CARDIA)    Difficulty of Paying Living Expenses: Somewhat hard  Food Insecurity: Food Insecurity Present (12/24/2021)   Hunger Vital Sign    Worried About Running Out of Food in the Last Year: Sometimes true    Ran Out of Food in the Last Year: Never true  Transportation Needs: No Transportation Needs (12/24/2021)   PRAPARE - Hydrologist (Medical): No    Lack of Transportation (Non-Medical): No  Physical Activity: Insufficiently Active (12/24/2021)   Exercise Vital Sign    Days of Exercise per Week: 3 days    Minutes of Exercise per Session: 30 min  Stress: Stress Concern Present (12/24/2021)   [place]    Feeling of Stress : To some extent  Social Connections: Moderately Isolated (12/24/2021)   Social Connection and Isolation Panel [NHANES]    Frequency of Communication with Friends and Family: More than three times a week  Frequency of Social Gatherings with Friends and Family: Once a week    Attends Religious Services: Never    Marine scientist or Organizations: No    Attends Archivist Meetings: Never    Marital Status: Married  Human resources officer Violence: Not At Risk (12/24/2021)   Humiliation, Afraid, Rape, and Kick questionnaire    Fear of Current or Ex-Partner: No    Emotionally Abused: No    Physically Abused: No    Sexually Abused: No    Past Surgical History:  Procedure Laterality Date   ABDOMINAL HYSTERECTOMY  08/2019   ANAL RECTAL MANOMETRY N/A 11/12/2020   Procedure: ANO RECTAL  MANOMETRY;  Surgeon: Lavena Bullion, DO;  Location: [facility];  Service: Gastroenterology;  Laterality: N/A;   BLADDER SURGERY  08/2019   BREAST REDUCTION SURGERY  2006   CARDIAC CATHETERIZATION N/A 07/17/2016   Procedure: Left Heart Cath and Coronary Angiography;  Surgeon: Burnell Blanks, MD;  Location: [facility];  Service: Cardiovascular;  Laterality: N/A;   CATARACT EXTRACTION W/ INTRAOCULAR LENS IMPLANT Bilateral    COLONOSCOPY  2015   CORONARY ARTERY BYPASS GRAFT N/A 06/04/2021   Procedure: CORONARY ARTERY BYPASS GRAFTING (CABG), ON PUMP, TIMES FOUR, USING LEFT INTERNAL MAMMARY ARTERY, LEFT RADIAL ARTERY (HARVESTED OPEN), AND RIGHT GREATER SAPHENOUS VEIN (HARVESTED ENDOSCOPICALLY);  Surgeon: Lajuana Matte, MD;  Location: [facility];  Service: Open Heart Surgery;  Laterality: N/A;   EYE SURGERY     LEFT HEART CATH AND CORONARY ANGIOGRAPHY N/A 04/22/2021   Procedure: LEFT HEART CATH AND CORONARY ANGIOGRAPHY;  Surgeon: Belva Crome, MD;  Location: [facility];  Service: Cardiovascular;  Laterality: N/A;   POLYPECTOMY  2015   RADIAL ARTERY HARVEST Left 06/04/2021   Procedure: RADIAL ARTERY HARVEST;  Surgeon: Lajuana Matte, MD;  Location: [facility];  Service: Open Heart Surgery;  Laterality: Left;   TEE WITHOUT CARDIOVERSION N/A 06/04/2021   Procedure: TRANSESOPHAGEAL ECHOCARDIOGRAM (TEE);  Surgeon: Lajuana Matte, MD;  Location: [facility];  Service: Open Heart Surgery;  Laterality: N/A;   TONSILLECTOMY      Family History  Problem Relation Age of Onset   Heart disease Mother        died at 39 of heart attack   Hypertension Mother    Heart attack Mother    Heart disease Father        had heart attack at age 19   Heart attack Father    Rectal cancer Father 65   Cancer Paternal Aunt    Stomach cancer Paternal Grandmother    Colon cancer Neg Hx    Colon polyps Neg Hx    Esophageal cancer Neg Hx     Allergies  Allergen Reactions   Amoxicillin      Had fever with flu like symptoms. Has patient had a PCN reaction causing immediate rash, facial/tongue/throat swelling, SOB or lightheadedness with hypotension: no Has patient had a PCN reaction causing severe rash involving mucus membranes or skin necrosis: no Has patient had a PCN reaction that required hospitalization no Has patient had a PCN reaction occurring within the last 10 years: yes If all of the above answers are "NO", then may proceed with Cephalosporin use.   Clopidogrel & Aspirin Itching   Amlodipine Nausea Only    Dizzy, heavy feeling, tongue burning   Atorvastatin Other (See Comments)    Myalgia   Crestor [Rosuvastatin] Itching   Metoprolol Other (See Comments)  hair loss   Pravastatin Itching   Repatha [Evolocumab] Itching    Current Outpatient Medications on File Prior to Visit  Medication Sig Dispense Refill   aspirin 81 MG chewable tablet Chew by mouth daily.     colchicine 0.6 MG tablet Take 1 tablet (0.6 mg total) by mouth 2 (two) times daily. 180 tablet 3   Cranberry-Vitamin C 250-60 MG CAPS Take 2 tablets by mouth daily.     D-MANNOSE PO Take 2 tablets by mouth daily.     diclofenac (VOLTAREN) 75 MG EC tablet Take 1 tablet (75 mg total) by mouth 2 (two) times daily. 20 tablet 0   diphenhydrAMINE (BENADRYL) 25 MG tablet Take 25 mg by mouth at bedtime.     ezetimibe (ZETIA) 10 MG tablet Take 1 tablet (10 mg total) by mouth daily. Please keep scheduled appointment for additional refills. 90 tablet 1   FIBER PO Take 2 tablets by mouth daily.     linaclotide (LINZESS) 290 MCG CAPS capsule Take 1 capsule (290 mcg total) by mouth daily before breakfast. 90 capsule 5   nitrofurantoin, macrocrystal-monohydrate, (MACROBID) 100 MG capsule Take 1 capsule (100 mg total) by mouth at bedtime as needed (after intercourse). 90 capsule 3   NON FORMULARY Take 0.5 tablets by mouth at bedtime. CBD oil - for pain Gummies     omeprazole (PRILOSEC) 40 MG capsule TAKE 1 CAPSULE  (40 MG TOTAL) BY MOUTH DAILY. 90 capsule 1   pantoprazole (PROTONIX) 40 MG tablet TAKE 1 TABLET BY MOUTH EVERY DAY 90 tablet 1   Polyethyl Glycol-Propyl Glycol (SYSTANE OP) Place 1 drop into both eyes in the morning and at bedtime.     PREMARIN vaginal cream PLACE 5.46 APPLICATORFULS VAGINALLY SEE ADMIN INSTRUCTIONS. INSERT 0.5 GRAMS VAGINALLY EVERY OTHER NIGHT 30 g 12   rosuvastatin (CRESTOR) 10 MG tablet Take 1 tablet (10 mg total) by mouth daily. 90 tablet 3   sodium polystyrene (KAYEXALATE) 15 GM/60ML suspension 60 ml po x 4 days 240 mL 0   No current facility-administered medications on file prior to visit.    BP 112/78 (BP Location: Left Arm, Patient Position: Sitting, Cuff Size: Normal)   Pulse 86   Temp 97.9 F (36.6 C) (Oral)   Resp 18   Ht '5\' 3"'$  (1.6 m)   Wt 102 lb 12.8 oz (46.6 kg)   LMP  (LMP Unknown)   SpO2 97%   BMI 18.21 kg/m        Objective:   Physical Exam  General- No acute distress. Pleasant patient. Neck- Full range of motion, no jvd Lungs- Clear, even and unlabored. Heart- regular rate and rhythm. Neurologic- CNII- XII grossly intact.  Heent- maxillary and frontal sinus pressure. Ears - canals clear, normal tms.  Rt hand- base of thumb small scratches superficial. No infection. No lymphadenopathy.    Assessment & Plan:   Patient Instructions  Your appear to have a sinus infection following uri. I am prescribing  doxycycline antibiotic for the infection. To help with the nasal congestion I prescribed nasal steroid flonase. For your associated cough, I prescribed cough medicine benzonatate.  Rest, hydrate, tylenol for fever.  Follow up in 7 days or as needed.    Mackie Pai, PA-C

## 2022-09-24 NOTE — Telephone Encounter (Signed)
I was able to speak with the patient about her recent JAK 2 test and polycythemia diagnosis. She is already taking a baby aspirin daily.  We will get her on a weekly phlebotomy program for 3 weeks and follow-up with lab check in 4 weeks.  Patient in agreement with the plan. No questions or concerns at this time. Patient appreciative of call.

## 2022-09-29 ENCOUNTER — Inpatient Hospital Stay: Payer: Medicare Other

## 2022-09-29 ENCOUNTER — Other Ambulatory Visit: Payer: Self-pay | Admitting: Family

## 2022-09-29 VITALS — BP 123/70 | HR 74 | Temp 98.1°F | Resp 18

## 2022-09-29 DIAGNOSIS — K922 Gastrointestinal hemorrhage, unspecified: Secondary | ICD-10-CM | POA: Diagnosis not present

## 2022-09-29 DIAGNOSIS — K648 Other hemorrhoids: Secondary | ICD-10-CM | POA: Diagnosis not present

## 2022-09-29 DIAGNOSIS — D500 Iron deficiency anemia secondary to blood loss (chronic): Secondary | ICD-10-CM | POA: Diagnosis not present

## 2022-09-29 DIAGNOSIS — D509 Iron deficiency anemia, unspecified: Secondary | ICD-10-CM

## 2022-09-29 DIAGNOSIS — D45 Polycythemia vera: Secondary | ICD-10-CM

## 2022-09-29 DIAGNOSIS — D751 Secondary polycythemia: Secondary | ICD-10-CM

## 2022-09-29 DIAGNOSIS — D649 Anemia, unspecified: Secondary | ICD-10-CM

## 2022-09-29 LAB — CMP (CANCER CENTER ONLY)
ALT: 20 U/L (ref 0–44)
AST: 26 U/L (ref 15–41)
Albumin: 4.7 g/dL (ref 3.5–5.0)
Alkaline Phosphatase: 80 U/L (ref 38–126)
Anion gap: 8 (ref 5–15)
BUN: 12 mg/dL (ref 8–23)
CO2: 32 mmol/L (ref 22–32)
Calcium: 10.4 mg/dL — ABNORMAL HIGH (ref 8.9–10.3)
Chloride: 97 mmol/L — ABNORMAL LOW (ref 98–111)
Creatinine: 0.89 mg/dL (ref 0.44–1.00)
GFR, Estimated: >60 mL/min (ref >60)
Glucose, Bld: 118 mg/dL — ABNORMAL HIGH (ref 70–99)
Potassium: 5.1 mmol/L (ref 3.5–5.1)
Sodium: 137 mmol/L (ref 135–145)
Total Bilirubin: 0.7 mg/dL (ref 0.3–1.2)
Total Protein: 8.1 g/dL (ref 6.5–8.1)

## 2022-09-29 LAB — RETICULOCYTES
Immature Retic Fract: 17.2 % — ABNORMAL HIGH (ref 2.3–15.9)
RBC.: 7.2 MIL/uL — ABNORMAL HIGH (ref 3.87–5.11)
Retic Count, Absolute: 101.4 10*3/uL (ref 19.0–186.0)
Retic Ct Pct: 1.4 % (ref 0.4–3.1)

## 2022-09-29 LAB — CBC WITH DIFFERENTIAL (CANCER CENTER ONLY)
Abs Immature Granulocytes: 0.11 10*3/uL — ABNORMAL HIGH (ref 0.00–0.07)
Basophils Absolute: 0.1 10*3/uL (ref 0.0–0.1)
Basophils Relative: 1 %
Eosinophils Absolute: 0.3 10*3/uL (ref 0.0–0.5)
Eosinophils Relative: 3 %
HCT: 59.8 % — ABNORMAL HIGH (ref 36.0–46.0)
Hemoglobin: 18.1 g/dL — ABNORMAL HIGH (ref 12.0–15.0)
Immature Granulocytes: 1 %
Lymphocytes Relative: 19 %
Lymphs Abs: 2.1 10*3/uL (ref 0.7–4.0)
MCH: 24.1 pg — ABNORMAL LOW (ref 26.0–34.0)
MCHC: 30.3 g/dL (ref 30.0–36.0)
MCV: 79.6 fL — ABNORMAL LOW (ref 80.0–100.0)
Monocytes Absolute: 0.5 10*3/uL (ref 0.1–1.0)
Monocytes Relative: 4 %
Neutro Abs: 8.1 10*3/uL — ABNORMAL HIGH (ref 1.7–7.7)
Neutrophils Relative %: 72 %
Platelet Count: 453 10*3/uL — ABNORMAL HIGH (ref 150–400)
RBC: 7.51 MIL/uL — ABNORMAL HIGH (ref 3.87–5.11)
RDW: 19.4 % — ABNORMAL HIGH (ref 11.5–15.5)
WBC Count: 11.1 10*3/uL — ABNORMAL HIGH (ref 4.0–10.5)
nRBC: 0 % (ref 0.0–0.2)

## 2022-09-29 LAB — IRON AND IRON BINDING CAPACITY (CC-WL,HP ONLY)
Iron: 44 ug/dL (ref 28–170)
Saturation Ratios: 11 % (ref 10.4–31.8)
TIBC: 410 ug/dL (ref 250–450)
UIBC: 366 ug/dL (ref 148–442)

## 2022-09-29 LAB — FERRITIN: Ferritin: 8 ng/mL — ABNORMAL LOW (ref 11–307)

## 2022-09-29 MED ORDER — SODIUM CHLORIDE 0.9 % IV SOLN: INTRAVENOUS | Status: DC

## 2022-09-29 NOTE — Progress Notes (Signed)
Katrina Walters presents today for phlebotomy per MD orders. Phlebotomy procedure started at 136  and ended at 1340. 500 grams removed. Patient observed for 30 minutes after procedure without any incident. Patient tolerated procedure well. IV needle removed intact.

## 2022-09-29 NOTE — Patient Instructions (Signed)

## 2022-10-03 ENCOUNTER — Other Ambulatory Visit: Payer: Self-pay | Admitting: Family

## 2022-10-03 DIAGNOSIS — D509 Iron deficiency anemia, unspecified: Secondary | ICD-10-CM

## 2022-10-03 DIAGNOSIS — D45 Polycythemia vera: Secondary | ICD-10-CM

## 2022-10-06 ENCOUNTER — Inpatient Hospital Stay: Payer: Medicare Other

## 2022-10-06 DIAGNOSIS — K922 Gastrointestinal hemorrhage, unspecified: Secondary | ICD-10-CM | POA: Diagnosis not present

## 2022-10-06 DIAGNOSIS — D500 Iron deficiency anemia secondary to blood loss (chronic): Secondary | ICD-10-CM | POA: Diagnosis not present

## 2022-10-06 DIAGNOSIS — D45 Polycythemia vera: Secondary | ICD-10-CM

## 2022-10-06 DIAGNOSIS — K648 Other hemorrhoids: Secondary | ICD-10-CM | POA: Diagnosis not present

## 2022-10-06 DIAGNOSIS — D509 Iron deficiency anemia, unspecified: Secondary | ICD-10-CM

## 2022-10-06 LAB — CBC WITH DIFFERENTIAL (CANCER CENTER ONLY)
Abs Immature Granulocytes: 0.08 10*3/uL — ABNORMAL HIGH (ref 0.00–0.07)
Basophils Absolute: 0.1 10*3/uL (ref 0.0–0.1)
Basophils Relative: 1 %
Eosinophils Absolute: 0.3 10*3/uL (ref 0.0–0.5)
Eosinophils Relative: 3 %
HCT: 54.3 % — ABNORMAL HIGH (ref 36.0–46.0)
Hemoglobin: 16.3 g/dL — ABNORMAL HIGH (ref 12.0–15.0)
Immature Granulocytes: 1 %
Lymphocytes Relative: 19 %
Lymphs Abs: 2.1 10*3/uL (ref 0.7–4.0)
MCH: 24 pg — ABNORMAL LOW (ref 26.0–34.0)
MCHC: 30 g/dL (ref 30.0–36.0)
MCV: 80 fL (ref 80.0–100.0)
Monocytes Absolute: 0.6 10*3/uL (ref 0.1–1.0)
Monocytes Relative: 5 %
Neutro Abs: 8.1 10*3/uL — ABNORMAL HIGH (ref 1.7–7.7)
Neutrophils Relative %: 71 %
Platelet Count: 460 10*3/uL — ABNORMAL HIGH (ref 150–400)
RBC: 6.79 MIL/uL — ABNORMAL HIGH (ref 3.87–5.11)
RDW: 19.1 % — ABNORMAL HIGH (ref 11.5–15.5)
WBC Count: 11.4 10*3/uL — ABNORMAL HIGH (ref 4.0–10.5)
nRBC: 0 % (ref 0.0–0.2)

## 2022-10-06 LAB — CMP (CANCER CENTER ONLY)
ALT: 25 U/L (ref 0–44)
AST: 32 U/L (ref 15–41)
Albumin: 4.4 g/dL (ref 3.5–5.0)
Alkaline Phosphatase: 70 U/L (ref 38–126)
Anion gap: 7 (ref 5–15)
BUN: 9 mg/dL (ref 8–23)
CO2: 31 mmol/L (ref 22–32)
Calcium: 10.2 mg/dL (ref 8.9–10.3)
Chloride: 100 mmol/L (ref 98–111)
Creatinine: 0.89 mg/dL (ref 0.44–1.00)
GFR, Estimated: >60 mL/min (ref >60)
Glucose, Bld: 96 mg/dL (ref 70–99)
Potassium: 4.4 mmol/L (ref 3.5–5.1)
Sodium: 138 mmol/L (ref 135–145)
Total Bilirubin: 0.8 mg/dL (ref 0.3–1.2)
Total Protein: 7.4 g/dL (ref 6.5–8.1)

## 2022-10-06 NOTE — Patient Instructions (Signed)

## 2022-10-06 NOTE — Progress Notes (Signed)
[name] presents today for phlebotomy per MD orders. Phlebotomy procedure started at 1245 and ended at 1255. 515 grams removed from lt AC using 16 g phlebotomy kit by MMorris, RN Patient observed for 30 minutes after procedure without any incident. Patient tolerated procedure well. IV needle removed intact.

## 2022-10-08 ENCOUNTER — Encounter: Payer: Self-pay | Admitting: Gastroenterology

## 2022-10-08 ENCOUNTER — Ambulatory Visit: Payer: Medicare Other | Admitting: Gastroenterology

## 2022-10-08 VITALS — BP 110/60 | HR 69 | Ht 63.0 in | Wt 100.0 lb

## 2022-10-08 DIAGNOSIS — K219 Gastro-esophageal reflux disease without esophagitis: Secondary | ICD-10-CM

## 2022-10-08 DIAGNOSIS — Z860100 Personal history of colon polyps, unspecified: Secondary | ICD-10-CM

## 2022-10-08 DIAGNOSIS — K581 Irritable bowel syndrome with constipation: Secondary | ICD-10-CM | POA: Diagnosis not present

## 2022-10-08 DIAGNOSIS — K648 Other hemorrhoids: Secondary | ICD-10-CM | POA: Diagnosis not present

## 2022-10-08 DIAGNOSIS — R12 Heartburn: Secondary | ICD-10-CM | POA: Diagnosis not present

## 2022-10-08 DIAGNOSIS — K921 Melena: Secondary | ICD-10-CM | POA: Diagnosis not present

## 2022-10-08 MED ORDER — LINACLOTIDE 290 MCG PO CAPS: 290.0000 ug | ORAL_CAPSULE | Freq: Every day | ORAL | 5 refills | Status: DC

## 2022-10-08 NOTE — Progress Notes (Signed)
Chief Complaint:    GERD, IBS, medication refill  GI History: 67 year old female with a history of CAD s/p 4V CABG 05/2021, history of Mediterranean fever diagnosed in childhood (takes colchicine indefinitely), HLD, hysterectomy, follows in the GI clinic for chronic constipation.  Previously trialed Dulcolax qod and increased hydration without much response, MiraLAX. - 02/2020: Abdominal x-ray with abundant stool burden.  Trialed Amitiza in 02/2020 with good efficacy but stopped due to upset stomach. - 03/2020: Started on [place] with good clinical response, but reduced efficacy in early 2022.  (Interestingly, constipation started after hysterectomy in 2020.  Prior baseline was BM Q3d without straining) -10/2020: Sitz marker study: 10 markers remain with 9 in the transverse colon indicating slow colonic transit.  Started Trulance -11/2020: ARM: Decreased resting pressure with normal squeeze.  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  Referred to PT - 11/2020: Trulance was suboptimally efficacious.  Stopped and went back to Linzess 290 mcg   History of internal hemorrhoids with intermittent scant BRBPR, typically responsive to OTC Preparation H.   Family history notable for father with colon cancer in his 51s.   Endoscopic history: -Colonoscopy (08/17/2014, Dr. Charisse Klinefelter at [facility]): Single small polypoid polyp at splenic flexure removed with snare (path: Unknown), Internal hemorrhoids. -Colonoscopy (03/2020, Dr. Bryan Lemma): 2 rectal polyps (1 tubular adenoma), tortuous sigmoid colon, lipoma in the ascending colon, internal hemorrhoids.  Repeat in 7 years  HPI:     Patient is a 67 y.o. female presenting to the Gastroenterology Clinic for follow-up.  Last seen by me on 09/03/2021.  Recently started on omeprazole last month by [facility] for suspected GERD with index sxs of HB, regurgitation.  Reflux  controlled since starting omeprazole 40 mg daily. No dysphagia.  No prior history of reflux.  No previous EGD.  Requesting refill of Linzess.  Completed pelvic floor PT for dyssynergia.  Otherwise constipation well-controlled with BM every 1-2 days, but no straining to ave BM. Will still have BRB on tissue paper, now essentially daily.  Has been following in the Hematology Clinic for iron deficiency and recently diagnosed with polycythemia vera on labs earlier this month.  Planning on therapeutic phlebotomy.   Review of systems:     No chest pain, no SOB, no fevers, no urinary sx   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 18 years in [place], managed on colchicine   Polycythemia vera ([place])    PONV (postoperative nausea and vomiting)     Patient's surgical history, family medical history, social history, medications and allergies were all reviewed in Epic    Current Outpatient Medications  Medication Sig Dispense Refill   aspirin 81 MG chewable tablet Chew by mouth daily.     benzonatate (TESSALON) 100 MG capsule Take 1 capsule (100 mg total) by mouth 3 (three) times daily as needed for cough. 30 capsule 0   colchicine 0.6 MG tablet Take 1 tablet (0.6 mg total) by mouth 2 (two) times daily. 180 tablet 3   Cranberry-Vitamin C 250-60 MG CAPS Take 2 tablets by mouth daily.     D-MANNOSE PO Take 2 tablets by mouth daily.     diclofenac (VOLTAREN) 75 MG EC tablet Take 1 tablet (75 mg total) by mouth  2 (two) times daily. 20 tablet 0   diphenhydrAMINE (BENADRYL) 25 MG tablet Take 25 mg by mouth at bedtime.     doxycycline (VIBRA-TABS) 100 MG tablet Take 1 tablet (100 mg total) by mouth 2 (two) times daily. 20 tablet 0   ezetimibe (ZETIA) 10 MG tablet Take 1 tablet (10 mg total) by mouth daily. Please keep scheduled appointment for additional  refills. 90 tablet 1   FIBER PO Take 2 tablets by mouth daily.     fluticasone (FLONASE) 50 MCG/ACT nasal spray Place 2 sprays into both nostrils daily. 16 g 1   linaclotide (LINZESS) 290 MCG CAPS capsule Take 1 capsule (290 mcg total) by mouth daily before breakfast. 90 capsule 5   nitrofurantoin, macrocrystal-monohydrate, (MACROBID) 100 MG capsule Take 1 capsule (100 mg total) by mouth at bedtime as needed (after intercourse). 90 capsule 3   NON FORMULARY Take 0.5 tablets by mouth at bedtime. CBD oil - for pain Gummies     omeprazole (PRILOSEC) 40 MG capsule TAKE 1 CAPSULE (40 MG TOTAL) BY MOUTH DAILY. 90 capsule 1   pantoprazole (PROTONIX) 40 MG tablet TAKE 1 TABLET BY MOUTH EVERY DAY 90 tablet 1   Polyethyl Glycol-Propyl Glycol (SYSTANE OP) Place 1 drop into both eyes in the morning and at bedtime.     PREMARIN vaginal cream PLACE 9.38 APPLICATORFULS VAGINALLY SEE ADMIN INSTRUCTIONS. INSERT 0.5 GRAMS VAGINALLY EVERY OTHER NIGHT 30 g 12   rosuvastatin (CRESTOR) 10 MG tablet Take 1 tablet (10 mg total) by mouth daily. 90 tablet 3   sodium polystyrene (KAYEXALATE) 15 GM/60ML suspension 60 ml po x 4 days 240 mL 0   No current facility-administered medications for this visit.    Physical Exam:     BP 110/60   Pulse 69   Ht '5\' 3"'$  (1.6 m)   Wt 100 lb (45.4 kg)   LMP  (LMP Unknown)   BMI 17.71 kg/m   GENERAL:  Pleasant female in NAD PSYCH: : Cooperative, normal affect Musculoskeletal:  Normal muscle tone, normal strength NEURO: Alert and oriented x 3, no focal neurologic deficits   IMPRESSION and PLAN:    1) GERD 2) Heartburn New onset reflux with good response to trial of PPI.  We discussed the pathophysiology of reflux at length today, to include potential alternate etiologies.  Given her age of onset, concomitant iron deficiency, and newly diagnosed polycythemia vera, prefer endoscopic evaluation to not only evaluate for erosive esophagitis, LES laxity, hiatal hernia, but also  for PUD, gastritis, duodenal pathology - EGD scheduled - Continue Prilosec - Continue antireflux lifestyle/dietary modifications  3) IBS-C - Well-controlled on current therapy - Placed refill for Linzess 290 mcg daily with 90-day supply and RF 5 - Continue adequate hydration and healthy eating habits  4) Hematochezia 5) Internal hemorrhoids - Will set up appointment for hemorrhoid banding  7) History of colon polyps - Repeat colonoscopy in 2028 for ongoing polyp surveillance  The indications, risks, and benefits of EGD were explained to the patient in detail. Risks include but are not limited to bleeding, perforation, adverse reaction to medications, and cardiopulmonary compromise. Sequelae include but are not limited to the possibility of surgery, hospitalization, and mortality. The patient verbalized understanding and wished to proceed. All questions answered, referred to scheduler. Further recommendations pending results of the exam.        Katrina Walters ,DO, FACG 10/08/2022, 11:32 AM

## 2022-10-08 NOTE — Patient Instructions (Addendum)
You have been scheduled for an appointment with Dr. Bryan Lemma on 11/05/22 at 940 am for Hemorrhoid Banding . Please arrive 10 minutes early for your appointment.   You have been scheduled for an endoscopy. Please follow written instructions given to you at your visit today. If you use inhalers (even only as needed), please bring them with you on the day of your procedure.   We have sent the following medications to your pharmacy for you to pick up at your convenience:   Linzess 290 MCG - Take 1 tablet daily  _______________________________________________________  If your blood pressure at your visit was 140/90 or greater, please contact your primary care physician to follow up on this.  _______________________________________________________  If you are age 66 or older, your body mass index should be between 23-30. Your Body mass index is 17.71 kg/m. If this is out of the aforementioned range listed, please consider follow up with your Primary Care Provider.  __________________________________________________________  The [facility] providers would like to encourage you to use [facility] to communicate with providers for non-urgent requests or questions.  Due to long hold times on the telephone, sending your provider a message by [facility] may be a faster and more efficient way to get a response.  Please allow 48 business hours for a response.  Please remember that this is for non-urgent requests.   Due to recent changes in healthcare laws, you may see the results of your imaging and laboratory studies on MyChart before your provider has had a chance to review them.  We understand that in some cases there may be results that are confusing or concerning to you. Not all laboratory results come back in the same time frame and the provider may be waiting for multiple results in order to interpret others.  Please give [place] 48 hours in order for your provider to thoroughly review all the results before  contacting the office for clarification of your results.     Thank you for choosing me and [facility].  Vito Cirigliano, D.O.

## 2022-10-09 ENCOUNTER — Encounter: Payer: Self-pay | Admitting: Gastroenterology

## 2022-10-09 ENCOUNTER — Ambulatory Visit (AMBULATORY_SURGERY_CENTER): Payer: Medicare Other | Admitting: Gastroenterology

## 2022-10-09 VITALS — BP 110/65 | HR 66 | Temp 97.5°F | Resp 10 | Ht 63.0 in | Wt 100.0 lb

## 2022-10-09 DIAGNOSIS — K219 Gastro-esophageal reflux disease without esophagitis: Secondary | ICD-10-CM | POA: Diagnosis not present

## 2022-10-09 DIAGNOSIS — B9681 Helicobacter pylori [H. pylori] as the cause of diseases classified elsewhere: Secondary | ICD-10-CM | POA: Diagnosis not present

## 2022-10-09 DIAGNOSIS — K2950 Unspecified chronic gastritis without bleeding: Secondary | ICD-10-CM | POA: Diagnosis not present

## 2022-10-09 DIAGNOSIS — K581 Irritable bowel syndrome with constipation: Secondary | ICD-10-CM

## 2022-10-09 DIAGNOSIS — K2970 Gastritis, unspecified, without bleeding: Secondary | ICD-10-CM

## 2022-10-09 DIAGNOSIS — E785 Hyperlipidemia, unspecified: Secondary | ICD-10-CM | POA: Diagnosis not present

## 2022-10-09 MED ORDER — SODIUM CHLORIDE 0.9 % IV SOLN: 500.0000 mL | INTRAVENOUS | Status: DC

## 2022-10-09 NOTE — Patient Instructions (Signed)
Please read handouts provided. Continue present medications. Await pathology results. Return to GI clinic. Resume omeprazole ( Prilosec ) as prescribed.  YOU HAD AN ENDOSCOPIC PROCEDURE TODAY AT [facility]:   Refer to the procedure report that was given to you for any specific questions about what was found during the examination.  If the procedure report does not answer your questions, please call your gastroenterologist to clarify.  If you requested that your care partner not be given the details of your procedure findings, then the procedure report has been included in a sealed envelope for you to review at your convenience later.  YOU SHOULD EXPECT: Some feelings of bloating in the abdomen. Passage of more gas than usual.  Walking can help get rid of the air that was put into your GI tract during the procedure and reduce the bloating. If you had a lower endoscopy (such as a colonoscopy or flexible sigmoidoscopy) you may notice spotting of blood in your stool or on the toilet paper. If you underwent a bowel prep for your procedure, you may not have a normal bowel movement for a few days.  Please Note:  You might notice some irritation and congestion in your nose or some drainage.  This is from the oxygen used during your procedure.  There is no need for concern and it should clear up in a day or so.  SYMPTOMS TO REPORT IMMEDIATELY:  Following upper endoscopy (EGD)  Vomiting of blood or coffee ground material  New chest pain or pain under the shoulder blades  Painful or persistently difficult swallowing  New shortness of breath  Fever of 100F or higher  Black, tarry-looking stools  For urgent or emergent issues, a gastroenterologist can be reached at any hour by calling [phone]. Do not use MyChart messaging for urgent concerns.    DIET:  We do recommend a small meal at first, but then you may proceed to your regular diet.  Drink plenty of fluids but you should  avoid alcoholic beverages for 24 hours.  ACTIVITY:  You should plan to take it easy for the rest of today and you should NOT DRIVE or use heavy machinery until tomorrow (because of the sedation medicines used during the test).    FOLLOW UP: Our staff will call the number listed on your records the next business day following your procedure.  We will call around 7:15- 8:00 am to check on you and address any questions or concerns that you may have regarding the information given to you following your procedure. If we do not reach you, we will leave a message.     If any biopsies were taken you will be contacted by phone or by letter within the next 1-3 weeks.  Please call us at [phone] if you have not heard about the biopsies in 3 weeks.    SIGNATURES/CONFIDENTIALITY: You and/or your care partner have signed paperwork which will be entered into your electronic medical record.  These signatures attest to the fact that that the information above on your After Visit Summary has been reviewed and is understood.  Full responsibility of the confidentiality of this discharge information lies with you and/or your care-partner.

## 2022-10-09 NOTE — Op Note (Signed)
[place] Patient Name: Katrina Walters Procedure Date: 10/09/2022 2:02 PM MRN: [id number] Endoscopist: Gerrit Heck , MD, [id number] Age: 67 Referring MD:  Date of Birth: 01-11-1956 Gender: Female Account #: [id number] Procedure:                Upper GI endoscopy w/ biopsy Indications:              Iron deficiency anemia, Heartburn, Suspected                            esophageal reflux Medicines:                Monitored Anesthesia Care Procedure:                Pre-Anesthesia Assessment:                           - Prior to the procedure, a History and Physical                            was performed, and patient medications and                            allergies were reviewed. The patient's tolerance of                            previous anesthesia was also reviewed. The risks                            and benefits of the procedure and the sedation                            options and risks were discussed with the patient.                            All questions were answered, and informed consent                            was obtained. Prior Anticoagulants: The patient has                            taken no anticoagulant or antiplatelet agents                            except for aspirin. ASA Grade Assessment: III - A                            patient with severe systemic disease. After                            reviewing the risks and benefits, the patient was                            deemed in satisfactory condition to undergo the  procedure.                           After obtaining informed consent, the endoscope was                            passed under direct vision. Throughout the                            procedure, the patient's blood pressure, pulse, and                            oxygen saturations were monitored continuously. The                            GIF [id number] #[id number] was introduced through the                             mouth, and advanced to the third part of duodenum.                            The upper GI endoscopy was accomplished without                            difficulty. The patient tolerated the procedure                            well. Scope In: Scope Out: Findings:                 The examined esophagus was normal.                           A 1 cm sliding type hiatal hernia was present.                           The gastroesophageal flap valve was visualized                            endoscopically and classified as Hill Grade III                            (minimal fold, loose to endoscope, hiatal hernia                            likely).                           Scattered mild inflammation characterized by                            congestion (edema) and erythema was found in the                            gastric fundus, in the gastric body and in the  gastric antrum. Biopsies were taken with a cold                            forceps for histology and Helicobacter pylori                            testing. Estimated blood loss was minimal.                           The examined duodenum was normal. Biopsies were                            taken with a cold forceps for histology. Estimated                            blood loss was minimal. Complications:            No immediate complications. Estimated Blood Loss:     Estimated blood loss was minimal. Impression:               - Normal esophagus.                           - 1 cm sliding type hiatal hernia.                           - Gastroesophageal flap valve classified as Hill                            Grade III (minimal fold, loose to endoscope, hiatal                            hernia likely).                           - Gastritis. Biopsied.                           - Normal examined duodenum. Biopsied. Recommendation:           - Patient has a contact number available for                             emergencies. The signs and symptoms of potential                            delayed complications were discussed with the                            patient. Return to normal activities tomorrow.                            Written discharge instructions were provided to the                            patient.                           -  Resume previous diet.                           - Continue present medications.                           - Await pathology results.                           - Return to GI clinic at appointment to be                            scheduled.                           - Resume omeprazole (Prilosec) as prescribed. Gerrit Heck, MD 10/09/2022 2:25:48 PM

## 2022-10-09 NOTE — Progress Notes (Signed)
GASTROENTEROLOGY PROCEDURE H&P NOTE   Primary Care Physician: Mackie Pai, PA-C    Reason for Procedure:   GERD, heartburn, regurgitation, IDA  Plan:    EGD  Patient is appropriate for endoscopic procedure(s) in the ambulatory ([facility]) setting.  The nature of the procedure, as well as the risks, benefits, and alternatives were carefully and thoroughly reviewed with the patient. Ample time for discussion and questions allowed. The patient understood, was satisfied, and agreed to proceed.     HPI: Katrina Walters is a 67 y.o. female who presents for EGD for evaluation of GERD, heratburn, regurgitation, and iron deficiency with concomitant P vera.  Patient was most recently seen in the Gastroenterology Clinic on 10/08/2022 by me.  No interval change in medical history since that appointment. Please refer to that note for full details regarding GI history and clinical presentation.   Past Medical History:  Diagnosis Date   Allergy    Cataract    Complication of anesthesia    Coronary artery disease    Eating disorder    Genital warts    Hyperlipidemia    Mediterranean fever    diagnosed ~ age 61 years in [place], managed on colchicine   Polycythemia vera ([place])    PONV (postoperative nausea and vomiting)     Past Surgical History:  Procedure Laterality Date   ABDOMINAL HYSTERECTOMY  08/2019   ANAL RECTAL MANOMETRY N/A 11/12/2020   Procedure: ANO RECTAL MANOMETRY;  Surgeon: Lavena Bullion, DO;  Location: [facility];  Service: Gastroenterology;  Laterality: N/A;   BLADDER SURGERY  08/2019   BREAST REDUCTION SURGERY  2006   CARDIAC CATHETERIZATION N/A 07/17/2016   Procedure: Left Heart Cath and Coronary Angiography;  Surgeon: Burnell Blanks, MD;  Location: [facility];  Service: Cardiovascular;  Laterality: N/A;   CATARACT EXTRACTION W/ INTRAOCULAR LENS IMPLANT Bilateral    COLONOSCOPY  2015   CORONARY ARTERY BYPASS GRAFT N/A 06/04/2021   Procedure: CORONARY  ARTERY BYPASS GRAFTING (CABG), ON PUMP, TIMES FOUR, USING LEFT INTERNAL MAMMARY ARTERY, LEFT RADIAL ARTERY (HARVESTED OPEN), AND RIGHT GREATER SAPHENOUS VEIN (HARVESTED ENDOSCOPICALLY);  Surgeon: Lajuana Matte, MD;  Location: [facility];  Service: Open Heart Surgery;  Laterality: N/A;   EYE SURGERY     LEFT HEART CATH AND CORONARY ANGIOGRAPHY N/A 04/22/2021   Procedure: LEFT HEART CATH AND CORONARY ANGIOGRAPHY;  Surgeon: Belva Crome, MD;  Location: [facility];  Service: Cardiovascular;  Laterality: N/A;   POLYPECTOMY  2015   RADIAL ARTERY HARVEST Left 06/04/2021   Procedure: RADIAL ARTERY HARVEST;  Surgeon: Lajuana Matte, MD;  Location: [facility];  Service: Open Heart Surgery;  Laterality: Left;   TEE WITHOUT CARDIOVERSION N/A 06/04/2021   Procedure: TRANSESOPHAGEAL ECHOCARDIOGRAM (TEE);  Surgeon: Lajuana Matte, MD;  Location: [facility];  Service: Open Heart Surgery;  Laterality: N/A;   TONSILLECTOMY      Prior to Admission medications   Medication Sig Start Date End Date Taking? Authorizing Provider  aspirin 81 MG chewable tablet Chew by mouth daily.   Yes [provider]  colchicine 0.6 MG tablet Take 1 tablet (0.6 mg total) by mouth 2 (two) times daily. 07/14/22  Yes Saguier, [name], PA-C  diphenhydrAMINE (BENADRYL) 25 MG tablet Take 25 mg by mouth at bedtime.   Yes [provider]  ezetimibe (ZETIA) 10 MG tablet Take 1 tablet (10 mg total) by mouth daily. Please keep scheduled appointment for additional refills. 07/17/22  Yes O'Neal,  Cassie Freer, MD  linaclotide [facility]) 290 MCG CAPS capsule Take 1 capsule (290 mcg total) by mouth daily before breakfast. 10/08/22  Yes Lashelle Koy V, DO  NON FORMULARY Take 0.5 tablets by mouth at bedtime. CBD oil - for pain Gummies   Yes [provider]  pantoprazole (PROTONIX) 40 MG tablet TAKE 1 TABLET BY MOUTH EVERY DAY 07/25/22  Yes Saguier, [name], PA-C  Polyethyl Glycol-Propyl Glycol (SYSTANE OP) Place 1  drop into both eyes in the morning and at bedtime.   Yes [provider]  PREMARIN vaginal cream PLACE 0.25 APPLICATORFULS VAGINALLY SEE ADMIN INSTRUCTIONS. INSERT 0.5 GRAMS VAGINALLY EVERY OTHER NIGHT 07/24/22  Yes Truett Mainland, DO  rosuvastatin (CRESTOR) 10 MG tablet Take 1 tablet (10 mg total) by mouth daily. 12/18/21  Yes O'Neal, Cassie Freer, MD  benzonatate (TESSALON) 100 MG capsule Take 1 capsule (100 mg total) by mouth 3 (three) times daily as needed for cough. 09/24/22   Saguier, [name], PA-C  Cranberry-Vitamin C 250-60 MG CAPS Take 2 tablets by mouth daily.    [provider]  D-MANNOSE PO Take 2 tablets by mouth daily.    [provider]  diclofenac (VOLTAREN) 75 MG EC tablet Take 1 tablet (75 mg total) by mouth 2 (two) times daily. 08/28/22   Saguier, [name], PA-C  doxycycline (VIBRA-TABS) 100 MG tablet Take 1 tablet (100 mg total) by mouth 2 (two) times daily. 09/24/22   Saguier, [name], PA-C  FIBER PO Take 2 tablets by mouth daily.    [provider]  fluticasone (FLONASE) 50 MCG/ACT nasal spray Place 2 sprays into both nostrils daily. 09/24/22   Saguier, [name], PA-C  nitrofurantoin, macrocrystal-monohydrate, (MACROBID) 100 MG capsule Take 1 capsule (100 mg total) by mouth at bedtime as needed (after intercourse). 07/09/22   Truett Mainland, DO  omeprazole (PRILOSEC) 40 MG capsule TAKE 1 CAPSULE (40 MG TOTAL) BY MOUTH DAILY. 09/18/22   Saguier, [name], PA-C  sodium polystyrene (KAYEXALATE) 15 GM/60ML suspension 60 ml po x 4 days 07/16/22   Saguier, [name], PA-C    Current Outpatient Medications  Medication Sig Dispense Refill   aspirin 81 MG chewable tablet Chew by mouth daily.     colchicine 0.6 MG tablet Take 1 tablet (0.6 mg total) by mouth 2 (two) times daily. 180 tablet 3   diphenhydrAMINE (BENADRYL) 25 MG tablet Take 25 mg by mouth at bedtime.     ezetimibe (ZETIA) 10 MG tablet Take 1 tablet (10 mg total) by mouth daily. Please keep  scheduled appointment for additional refills. 90 tablet 1   linaclotide (LINZESS) 290 MCG CAPS capsule Take 1 capsule (290 mcg total) by mouth daily before breakfast. 90 capsule 5   NON FORMULARY Take 0.5 tablets by mouth at bedtime. CBD oil - for pain Gummies     pantoprazole (PROTONIX) 40 MG tablet TAKE 1 TABLET BY MOUTH EVERY DAY 90 tablet 1   Polyethyl Glycol-Propyl Glycol (SYSTANE OP) Place 1 drop into both eyes in the morning and at bedtime.     PREMARIN vaginal cream PLACE 4.27 APPLICATORFULS VAGINALLY SEE ADMIN INSTRUCTIONS. INSERT 0.5 GRAMS VAGINALLY EVERY OTHER NIGHT 30 g 12   rosuvastatin (CRESTOR) 10 MG tablet Take 1 tablet (10 mg total) by mouth daily. 90 tablet 3   benzonatate (TESSALON) 100 MG capsule Take 1 capsule (100 mg total) by mouth 3 (three) times daily as needed for cough. 30 capsule 0   Cranberry-Vitamin C 250-60 MG CAPS Take 2 tablets by mouth  daily.     D-MANNOSE PO Take 2 tablets by mouth daily.     diclofenac (VOLTAREN) 75 MG EC tablet Take 1 tablet (75 mg total) by mouth 2 (two) times daily. 20 tablet 0   doxycycline (VIBRA-TABS) 100 MG tablet Take 1 tablet (100 mg total) by mouth 2 (two) times daily. 20 tablet 0   FIBER PO Take 2 tablets by mouth daily.     fluticasone (FLONASE) 50 MCG/ACT nasal spray Place 2 sprays into both nostrils daily. 16 g 1   nitrofurantoin, macrocrystal-monohydrate, (MACROBID) 100 MG capsule Take 1 capsule (100 mg total) by mouth at bedtime as needed (after intercourse). 90 capsule 3   omeprazole (PRILOSEC) 40 MG capsule TAKE 1 CAPSULE (40 MG TOTAL) BY MOUTH DAILY. 90 capsule 1   sodium polystyrene (KAYEXALATE) 15 GM/60ML suspension 60 ml po x 4 days 240 mL 0   Current Facility-Administered Medications  Medication Dose Route Frequency Provider Last Rate Last Admin   0.9 %  sodium chloride infusion  500 mL Intravenous Continuous Presley Gora V, DO        Allergies as of 10/09/2022 - Review Complete 10/09/2022  Allergen Reaction  Noted   Amoxicillin  12/04/2014   Clopidogrel & aspirin Itching 06/08/2017   Amlodipine Nausea Only 07/04/2021   Atorvastatin Other (See Comments) 08/20/2016   Crestor [rosuvastatin] Itching 03/23/2020   Metoprolol Other (See Comments) 03/23/2020   Pravastatin Itching 05/12/2019   Repatha [evolocumab] Itching 05/12/2019    Family History  Problem Relation Age of Onset   Heart disease Mother        died at 28 of heart attack   Hypertension Mother    Heart attack Mother    Heart disease Father        had heart attack at age 64   Heart attack Father    Rectal cancer Father 49   Cancer Paternal Aunt    Stomach cancer Paternal Grandmother    Colon cancer Neg Hx    Colon polyps Neg Hx    Esophageal cancer Neg Hx     Social History   Socioeconomic History   Marital status: Married    Spouse name: Not on file   Number of children: 1   Years of education: Not on file   Highest education level: Not on file  Occupational History   Occupation: unemployed at the time  Tobacco Use   Smoking status: Never   Smokeless tobacco: Never  Vaping Use   Vaping Use: Never used  Substance and Sexual Activity   Alcohol use: Not Currently    Alcohol/week: 0.0 standard drinks of alcohol    Comment: occassionally   Drug use: No    Comment: CBD gummies   Sexual activity: Yes  Other Topics Concern   Not on file  Social History Narrative   Not on file   Social Determinants of Health   Financial Resource Strain: Medium Risk (12/24/2021)   Overall Financial Resource Strain (CARDIA)    Difficulty of Paying Living Expenses: Somewhat hard  Food Insecurity: Food Insecurity Present (12/24/2021)   Hunger Vital Sign    Worried About Running Out of Food in the Last Year: Sometimes true    Ran Out of Food in the Last Year: Never true  Transportation Needs: No Transportation Needs (12/24/2021)   PRAPARE - Hydrologist (Medical): No    Lack of Transportation  (Non-Medical): No  Physical Activity: Insufficiently Active (12/24/2021)   Exercise  Vital Sign    Days of Exercise per Week: 3 days    Minutes of Exercise per Session: 30 min  Stress: Stress Concern Present (12/24/2021)   [employer] [place]    Feeling of Stress : To some extent  Social Connections: Moderately Isolated (12/24/2021)   Social Connection and Isolation Panel [NHANES]    Frequency of Communication with Friends and Family: More than three times a week    Frequency of Social Gatherings with Friends and Family: Once a week    Attends Religious Services: Never    Marine scientist or Organizations: No    Attends Archivist Meetings: Never    Marital Status: Married  Human resources officer Violence: Not At Risk (12/24/2021)   Humiliation, Afraid, Rape, and Kick questionnaire    Fear of Current or Ex-Partner: No    Emotionally Abused: No    Physically Abused: No    Sexually Abused: No    Physical Exam: Vital signs in last 24 hours: '@BP'$  (!) 140/87   Pulse 81   Temp (!) 97.5 F (36.4 C)   Ht '5\' 3"'$  (1.6 m)   Wt 100 lb (45.4 kg)   LMP  (LMP Unknown)   SpO2 100%   BMI 17.71 kg/m  GEN: NAD EYE: Sclerae anicteric ENT: MMM CV: Non-tachycardic Pulm: CTA b/l GI: Soft, NT/ND NEURO:  Alert & Oriented x 3   Gerrit Heck, DO [facility]   10/09/2022 1:55 PM

## 2022-10-09 NOTE — Progress Notes (Signed)
Pt's states no medical or surgical changes since previsit or office visit. DT, NT obtained VS. 

## 2022-10-09 NOTE — Progress Notes (Signed)
Pt resting comfortably. VSS. Airway intact. SBAR complete to RN. All questions answered.   

## 2022-10-09 NOTE — Progress Notes (Signed)
Called to room to assist during endoscopic procedure.  Patient ID and intended procedure confirmed with present staff. Received instructions for my participation in the procedure from the performing physician.  

## 2022-10-10 ENCOUNTER — Telehealth: Payer: Self-pay | Admitting: *Deleted

## 2022-10-10 NOTE — Telephone Encounter (Signed)
First follow up call attempt.  Left VM to call if any questions or concerns.

## 2022-10-13 ENCOUNTER — Inpatient Hospital Stay: Payer: Medicare Other | Attending: Hematology & Oncology

## 2022-10-13 ENCOUNTER — Other Ambulatory Visit: Payer: Medicare Other

## 2022-10-13 VITALS — BP 111/55 | HR 73 | Temp 97.4°F | Resp 17

## 2022-10-13 DIAGNOSIS — D649 Anemia, unspecified: Secondary | ICD-10-CM

## 2022-10-13 DIAGNOSIS — D45 Polycythemia vera: Secondary | ICD-10-CM | POA: Diagnosis not present

## 2022-10-13 DIAGNOSIS — D509 Iron deficiency anemia, unspecified: Secondary | ICD-10-CM

## 2022-10-13 NOTE — Progress Notes (Signed)
Katrina Walters presents today for phlebotomy per MD orders. Phlebotomy procedure started at 1225 and ended at 1332 as phlebotomy needle was pulled out accidentally intact.  313 grams removed via 16 gauge needle to R AC using phlebotomy kit. Phlebotomy restarted using 16 gauge needle to left AC starting at 1338 and ended at 1344. 225 grams removed using phlebotomy kit.  Patient observed for 15 minutes after procedure without any incident. Pt declined to stay for full 30 minutes stating she has tolerated procedure prior without difficulty. Patient tolerated procedure well. IV needle removed intact.

## 2022-10-13 NOTE — Patient Instructions (Signed)

## 2022-10-17 ENCOUNTER — Other Ambulatory Visit: Payer: Self-pay

## 2022-10-17 ENCOUNTER — Encounter: Payer: Self-pay | Admitting: Gastroenterology

## 2022-10-17 ENCOUNTER — Other Ambulatory Visit: Payer: Self-pay | Admitting: *Deleted

## 2022-10-17 DIAGNOSIS — D649 Anemia, unspecified: Secondary | ICD-10-CM

## 2022-10-17 DIAGNOSIS — D45 Polycythemia vera: Secondary | ICD-10-CM

## 2022-10-17 DIAGNOSIS — D509 Iron deficiency anemia, unspecified: Secondary | ICD-10-CM

## 2022-10-17 DIAGNOSIS — D751 Secondary polycythemia: Secondary | ICD-10-CM

## 2022-10-17 MED ORDER — METRONIDAZOLE 250 MG PO TABS: 250.0000 mg | ORAL_TABLET | Freq: Four times a day (QID) | ORAL | 0 refills | Status: DC

## 2022-10-17 MED ORDER — BISMUTH 262 MG PO CHEW: 524.0000 mg | CHEWABLE_TABLET | Freq: Four times a day (QID) | ORAL | 0 refills | Status: DC

## 2022-10-17 MED ORDER — OMEPRAZOLE 20 MG PO CPDR: 20.0000 mg | DELAYED_RELEASE_CAPSULE | Freq: Two times a day (BID) | ORAL | 0 refills | Status: DC

## 2022-10-17 MED ORDER — DOXYCYCLINE HYCLATE 100 MG PO CAPS: 100.0000 mg | ORAL_CAPSULE | Freq: Two times a day (BID) | ORAL | 0 refills | Status: DC

## 2022-10-20 ENCOUNTER — Inpatient Hospital Stay: Payer: Medicare Other

## 2022-10-20 ENCOUNTER — Other Ambulatory Visit: Payer: Self-pay

## 2022-10-20 ENCOUNTER — Encounter: Payer: Self-pay | Admitting: Hematology & Oncology

## 2022-10-20 ENCOUNTER — Inpatient Hospital Stay (HOSPITAL_BASED_OUTPATIENT_CLINIC_OR_DEPARTMENT_OTHER): Payer: Medicare Other | Admitting: Hematology & Oncology

## 2022-10-20 VITALS — BP 135/72 | HR 84 | Temp 98.1°F | Resp 18 | Ht 63.0 in | Wt 100.0 lb

## 2022-10-20 DIAGNOSIS — D45 Polycythemia vera: Secondary | ICD-10-CM

## 2022-10-20 DIAGNOSIS — D509 Iron deficiency anemia, unspecified: Secondary | ICD-10-CM

## 2022-10-20 DIAGNOSIS — D649 Anemia, unspecified: Secondary | ICD-10-CM

## 2022-10-20 DIAGNOSIS — D751 Secondary polycythemia: Secondary | ICD-10-CM

## 2022-10-20 LAB — CBC WITH DIFFERENTIAL (CANCER CENTER ONLY)
Abs Immature Granulocytes: 0.17 10*3/uL — ABNORMAL HIGH (ref 0.00–0.07)
Basophils Absolute: 0.1 10*3/uL (ref 0.0–0.1)
Basophils Relative: 1 %
Eosinophils Absolute: 0.3 10*3/uL (ref 0.0–0.5)
Eosinophils Relative: 3 %
HCT: 41.1 % (ref 36.0–46.0)
Hemoglobin: 12.3 g/dL (ref 12.0–15.0)
Immature Granulocytes: 2 %
Lymphocytes Relative: 18 %
Lymphs Abs: 2 10*3/uL (ref 0.7–4.0)
MCH: 23.3 pg — ABNORMAL LOW (ref 26.0–34.0)
MCHC: 29.9 g/dL — ABNORMAL LOW (ref 30.0–36.0)
MCV: 77.7 fL — ABNORMAL LOW (ref 80.0–100.0)
Monocytes Absolute: 0.7 10*3/uL (ref 0.1–1.0)
Monocytes Relative: 6 %
Neutro Abs: 7.8 10*3/uL — ABNORMAL HIGH (ref 1.7–7.7)
Neutrophils Relative %: 70 %
Platelet Count: 439 10*3/uL — ABNORMAL HIGH (ref 150–400)
RBC: 5.29 MIL/uL — ABNORMAL HIGH (ref 3.87–5.11)
RDW: 17.4 % — ABNORMAL HIGH (ref 11.5–15.5)
WBC Count: 11.1 10*3/uL — ABNORMAL HIGH (ref 4.0–10.5)
nRBC: 0 % (ref 0.0–0.2)

## 2022-10-20 LAB — IRON AND IRON BINDING CAPACITY (CC-WL,HP ONLY)
Iron: 20 ug/dL — ABNORMAL LOW (ref 28–170)
Saturation Ratios: 5 % — ABNORMAL LOW (ref 10.4–31.8)
TIBC: 403 ug/dL (ref 250–450)
UIBC: 383 ug/dL (ref 148–442)

## 2022-10-20 LAB — RETICULOCYTES
Immature Retic Fract: 16.7 % — ABNORMAL HIGH (ref 2.3–15.9)
RBC.: 5.16 MIL/uL — ABNORMAL HIGH (ref 3.87–5.11)
Retic Count, Absolute: 93.3 10*3/uL (ref 19.0–186.0)
Retic Ct Pct: 1.8 % (ref 0.4–3.1)

## 2022-10-20 LAB — CMP (CANCER CENTER ONLY)
ALT: 20 U/L (ref 0–44)
AST: 27 U/L (ref 15–41)
Albumin: 3.7 g/dL (ref 3.5–5.0)
Alkaline Phosphatase: 64 U/L (ref 38–126)
Anion gap: 7 (ref 5–15)
BUN: 11 mg/dL (ref 8–23)
CO2: 27 mmol/L (ref 22–32)
Calcium: 8.7 mg/dL — ABNORMAL LOW (ref 8.9–10.3)
Chloride: 104 mmol/L (ref 98–111)
Creatinine: 0.74 mg/dL (ref 0.44–1.00)
GFR, Estimated: >60 mL/min (ref >60)
Glucose, Bld: 100 mg/dL — ABNORMAL HIGH (ref 70–99)
Potassium: 4.8 mmol/L (ref 3.5–5.1)
Sodium: 138 mmol/L (ref 135–145)
Total Bilirubin: 0.4 mg/dL (ref 0.3–1.2)
Total Protein: 7.1 g/dL (ref 6.5–8.1)

## 2022-10-20 LAB — FERRITIN: Ferritin: 7 ng/mL — ABNORMAL LOW (ref 11–307)

## 2022-10-20 NOTE — Progress Notes (Signed)
Hematology and Oncology Follow Up Visit  Farra Vonasek [id number] 09/22/1955 67 y.o. 10/20/2022   Principle Diagnosis:   Polycythemia Vera -- JAK2 (+)    Current Therapy:        Phlebotomy to maintain medical below 45% Aspirin 81 mg p.o. daily   Interim History:  Ms. Wagenblast is here today for follow-up.  She comes in with her husband.  It is a lot of fun talking to both of them.  She does have polycythemia vera.  We did do lab test on her.  She is JAK2 positive.  She has been phlebotomized.  Her hemoglobin is come down quite nicely.  She has her main complaint has been some abdominal bloating.  I am unsure if this is anything related to the polycythemia.  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  Her last iron studies that were done back in January showed a ferritin of 8 with an iron saturation of 11%.  She has had no cough or shortness of breath.  She has had little bit of bony pain in the long bones.  Again this might be from the polycythemia.  She has had no rashes.  She has had no headache..  She has occasional fever and sweats.  Overall, I would say that her performance status is probably ECOG 1.  Medications:  Allergies as of 10/20/2022       Reactions   Amoxicillin    Had fever with flu like symptoms. Has patient had a PCN reaction causing immediate rash, facial/tongue/throat swelling, SOB or lightheadedness with hypotension: no Has patient had a PCN reaction causing severe rash involving mucus membranes or skin necrosis: no Has patient had a PCN reaction that required hospitalization no Has patient had a PCN reaction occurring within the last 10 years: yes If all of the above answers are "NO", then may proceed with Cephalosporin use.   Clopidogrel & Aspirin Itching   Amlodipine Nausea Only   Dizzy, heavy feeling, tongue burning   Atorvastatin Other (See Comments)   Myalgia   Crestor [rosuvastatin] Itching   Metoprolol Other (See  Comments)   hair loss   Pravastatin Itching   Repatha [evolocumab] Itching        Medication List        Accurate as of October 20, 2022  1:16 PM. If you have any questions, ask your nurse or doctor.          aspirin 81 MG chewable tablet Chew by mouth daily.   benzonatate 100 MG capsule Commonly known as: TESSALON Take 1 capsule (100 mg total) by mouth 3 (three) times daily as needed for cough.   Bismuth 262 MG Chew Chew 524 mg by mouth in the morning, at noon, in the evening, and at bedtime.   colchicine 0.6 MG tablet Take 1 tablet (0.6 mg total) by mouth 2 (two) times daily.   Cranberry-Vitamin C 250-60 MG Caps Take 2 tablets by mouth daily.   D-MANNOSE PO Take 2 tablets by mouth daily.   diclofenac 75 MG EC tablet Commonly known as: VOLTAREN Take 1 tablet (75 mg total) by mouth 2 (two) times daily.   diphenhydrAMINE 25 MG tablet Commonly known as: BENADRYL Take 25 mg by mouth at bedtime.   doxycycline 100 MG tablet Commonly known as: VIBRA-TABS Take 1 tablet (100 mg total) by mouth 2 (two) times daily.   doxycycline 100 MG capsule Commonly known as: VIBRAMYCIN Take 1 capsule (100  mg total) by mouth 2 (two) times daily.   ezetimibe 10 MG tablet Commonly known as: ZETIA Take 1 tablet (10 mg total) by mouth daily. Please keep scheduled appointment for additional refills.   FIBER PO Take 2 tablets by mouth daily.   fluticasone 50 MCG/ACT nasal spray Commonly known as: FLONASE Place 2 sprays into both nostrils daily.   linaclotide 290 MCG Caps capsule Commonly known as: LINZESS Take 1 capsule (290 mcg total) by mouth daily before breakfast.   metroNIDAZOLE 250 MG tablet Commonly known as: FLAGYL Take 1 tablet (250 mg total) by mouth 4 (four) times daily.   nitrofurantoin (macrocrystal-monohydrate) 100 MG capsule Commonly known as: MACROBID Take 1 capsule (100 mg total) by mouth at bedtime as needed (after intercourse).   NON FORMULARY Take  0.5 tablets by mouth at bedtime. CBD oil - for pain Gummies   omeprazole 40 MG capsule Commonly known as: PRILOSEC TAKE 1 CAPSULE (40 MG TOTAL) BY MOUTH DAILY.   omeprazole 20 MG capsule Commonly known as: PRILOSEC Take 1 capsule (20 mg total) by mouth 2 (two) times daily before a meal.   pantoprazole 40 MG tablet Commonly known as: PROTONIX TAKE 1 TABLET BY MOUTH EVERY DAY   Premarin vaginal cream Generic drug: conjugated estrogens PLACE [id number] APPLICATORFULS VAGINALLY SEE ADMIN INSTRUCTIONS. INSERT 0.5 GRAMS VAGINALLY EVERY OTHER NIGHT   rosuvastatin 10 MG tablet Commonly known as: CRESTOR Take 1 tablet (10 mg total) by mouth daily.   sodium polystyrene 15 GM/60ML suspension Commonly known as: KAYEXALATE 60 ml po x 4 days   SYSTANE OP Place 1 drop into both eyes in the morning and at bedtime.        Allergies:  Allergies  Allergen Reactions   Amoxicillin     Had fever with flu like symptoms. Has patient had a PCN reaction causing immediate rash, facial/tongue/throat swelling, SOB or lightheadedness with hypotension: no Has patient had a PCN reaction causing severe rash involving mucus membranes or skin necrosis: no Has patient had a PCN reaction that required hospitalization no Has patient had a PCN reaction occurring within the last 10 years: yes If all of the above answers are "NO", then may proceed with Cephalosporin use.   Clopidogrel & Aspirin Itching   Amlodipine Nausea Only    Dizzy, heavy feeling, tongue burning   Atorvastatin Other (See Comments)    Myalgia   Crestor [Rosuvastatin] Itching   Metoprolol Other (See Comments)    hair loss   Pravastatin Itching   Repatha [Evolocumab] Itching    Past Medical History, Surgical history, Social history, and Family History were reviewed and updated.  Review of Systems: Review of Systems  Constitutional:  Positive for diaphoresis and fever.  HENT: Negative.    Eyes: Negative.   Respiratory: Negative.     Cardiovascular: Negative.   Gastrointestinal: Negative.   Genitourinary: Negative.   Musculoskeletal: Negative.   Skin: Negative.   Neurological: Negative.   Endo/Heme/Allergies: Negative.   Psychiatric/Behavioral: Negative.       Physical Exam:  Her vital signs show a temperature of 98.1.  Pulse 84.  Blood pressure 135/72.  Weight is 100 pounds.  Wt Readings from Last 3 Encounters:  10/09/22 100 lb (45.4 kg)  10/08/22 100 lb (45.4 kg)  09/24/22 102 lb 12.8 oz (46.6 kg)    Physical Exam Vitals reviewed.  HENT:     Head: Normocephalic and atraumatic.  Eyes:     Pupils: Pupils are equal, round, and reactive to  light.  Cardiovascular:     Rate and Rhythm: Normal rate and regular rhythm.     Heart sounds: Normal heart sounds.  Pulmonary:     Effort: Pulmonary effort is normal.     Breath sounds: Normal breath sounds.  Abdominal:     General: Bowel sounds are normal.     Palpations: Abdomen is soft.  Musculoskeletal:        General: No tenderness or deformity. Normal range of motion.     Cervical back: Normal range of motion.  Lymphadenopathy:     Cervical: No cervical adenopathy.  Skin:    General: Skin is warm and dry.     Findings: No erythema or rash.  Neurological:     Mental Status: She is alert and oriented to person, place, and time.  Psychiatric:        Behavior: Behavior normal.        Thought Content: Thought content normal.        Judgment: Judgment normal.      Lab Results  Component Value Date   WBC 11.1 (H) 10/20/2022   HGB 12.3 10/20/2022   HCT 41.1 10/20/2022   MCV 77.7 (L) 10/20/2022   PLT 439 (H) 10/20/2022   Lab Results  Component Value Date   FERRITIN 8 (L) 09/29/2022   IRON 44 09/29/2022   TIBC 410 09/29/2022   UIBC 366 09/29/2022   IRONPCTSAT 11 09/29/2022   Lab Results  Component Value Date   RETICCTPCT 1.4 09/29/2022   RBC 5.29 (H) 10/20/2022   No results found for: "KPAFRELGTCHN", "LAMBDASER", "KAPLAMBRATIO" No results  found for: "IGGSERUM", "IGA", "IGMSERUM" No results found for: "TOTALPROTELP", "ALBUMINELP", "A1GS", "A2GS", "BETS", "BETA2SER", "GAMS", "MSPIKE", "SPEI"   Chemistry      Component Value Date/Time   NA 138 10/06/2022 1202   NA 139 04/15/2021 1027   K 4.4 10/06/2022 1202   CL 100 10/06/2022 1202   CO2 31 10/06/2022 1202   BUN 9 10/06/2022 1202   BUN 14 04/15/2021 1027   CREATININE 0.89 10/06/2022 1202   CREATININE 0.84 07/14/2016 0914      Component Value Date/Time   CALCIUM 10.2 10/06/2022 1202   ALKPHOS 70 10/06/2022 1202   AST 32 10/06/2022 1202   ALT 25 10/06/2022 1202   BILITOT 0.8 10/06/2022 1202       Impression and Plan: Ms. Aniol is a very pleasant 67 yo caucasian female with polycythemia vera.  She is JAK2 positive.  She has been phlebotomized.  Of note, her erythropoietin level is 13.  I am not surprised by the iron deficiency.  I think she will always have an element of iron deficiency.  She does not need to be phlebotomized today.  I will go ahead and plan to have her come back in 1 month.  We will see how she does with respect to her hemoglobin increasing again.  She is incredibly charming to talk to.  I really enjoyed talking to she and her husband.    Volanda Napoleon, MD 2/12/20241:16 PM

## 2022-10-21 ENCOUNTER — Encounter: Payer: Self-pay | Admitting: Gastroenterology

## 2022-10-22 ENCOUNTER — Other Ambulatory Visit: Payer: Self-pay

## 2022-10-23 NOTE — Telephone Encounter (Signed)
When I tried to send in pylera I received a message that pt is allergic to aspirin and clopidogrel. The reaction is itching.

## 2022-10-23 NOTE — Telephone Encounter (Signed)
Since there is a potential reaction to Pylera given her aspirin allergy and Pylera contains bismuth, and if she is concerned that the metronidazole was the reason that she was not tolerating the quadruple therapy regimen, would not go forward with Pylera.  Instead recommend levofloxacin triple therapy with the following: - Levofloxacin 500 mg daily x 10 days - Amoxicillin 750 mg 3 times daily x 10 days - Omeprazole 40 mg twice daily x 10 days

## 2022-10-27 NOTE — Progress Notes (Signed)
Cardiology Office Note:   Date:  10/31/2022  NAME:  Katrina Walters    MRN: [id number] DOB:  October 23, 1955   PCP:  Mackie Pai, PA-C  Cardiologist:  Evalina Field, MD  Electrophysiologist:  None   Referring MD: Elise Benne   Chief Complaint  Patient presents with   Follow-up        History of Present Illness:   Katrina Walters is a 67 y.o. female with a hx of CAD status post CABG, hyperlipidemia, polycythemia vera who presents for follow-up.  She reports she is doing well.  Notices an occasional fluttering in her chest.  Last seconds.  Can occur every 3 to 4 days.  We discussed this is likely normal.  She has worn monitors in the past that were unremarkable.  Blood pressure is stable.  She is able to tolerate Crestor and Zetia.  Most recent LDL 43.  She would like to recheck this today to make sure it is stable.  I do agree she needs this checked yearly.  She denies any chest pain.  She is exercising.  She has been diagnosed with polycythemia vera.  She requires phlebotomy periodically.  Problem List CAD -3v CAD -CABG 06/04/2021 -LIMA-LAD, SVG-OM, SVG-PDA, SVG-Diag 2. HLD -Intolerant of high intensity statin -Pruritus on repatha/praluent -T chol 98, HDL 32, LDL 43, triglycerides 110 3. Mediterranean Fever 4. Iron deficiency anemia  5. Polycythemia Vera  -JAK2+  Past Medical History: Past Medical History:  Diagnosis Date   Allergy    Cataract    Complication of anesthesia    Coronary artery disease    Eating disorder    Genital warts    Hyperlipidemia    Mediterranean fever    diagnosed ~ age 79 years in [place], managed on colchicine   Polycythemia vera ([place])    PONV (postoperative nausea and vomiting)     Past Surgical History: Past Surgical History:  Procedure Laterality Date   ABDOMINAL HYSTERECTOMY  08/2019   ANAL RECTAL MANOMETRY N/A 11/12/2020   Procedure: ANO RECTAL MANOMETRY;  Surgeon: Lavena Bullion, DO;  Location: [facility];  Service:  Gastroenterology;  Laterality: N/A;   BLADDER SURGERY  08/2019   BREAST REDUCTION SURGERY  2006   CARDIAC CATHETERIZATION N/A 07/17/2016   Procedure: Left Heart Cath and Coronary Angiography;  Surgeon: Burnell Blanks, MD;  Location: [facility];  Service: Cardiovascular;  Laterality: N/A;   CATARACT EXTRACTION W/ INTRAOCULAR LENS IMPLANT Bilateral    COLONOSCOPY  2015   CORONARY ARTERY BYPASS GRAFT N/A 06/04/2021   Procedure: CORONARY ARTERY BYPASS GRAFTING (CABG), ON PUMP, TIMES FOUR, USING LEFT INTERNAL MAMMARY ARTERY, LEFT RADIAL ARTERY (HARVESTED OPEN), AND RIGHT GREATER SAPHENOUS VEIN (HARVESTED ENDOSCOPICALLY);  Surgeon: Lajuana Matte, MD;  Location: [facility];  Service: Open Heart Surgery;  Laterality: N/A;   EYE SURGERY     LEFT HEART CATH AND CORONARY ANGIOGRAPHY N/A 04/22/2021   Procedure: LEFT HEART CATH AND CORONARY ANGIOGRAPHY;  Surgeon: Belva Crome, MD;  Location: [facility];  Service: Cardiovascular;  Laterality: N/A;   POLYPECTOMY  2015   RADIAL ARTERY HARVEST Left 06/04/2021   Procedure: RADIAL ARTERY HARVEST;  Surgeon: Lajuana Matte, MD;  Location: [facility];  Service: Open Heart Surgery;  Laterality: Left;   TEE WITHOUT CARDIOVERSION N/A 06/04/2021   Procedure: TRANSESOPHAGEAL ECHOCARDIOGRAM (TEE);  Surgeon: Lajuana Matte, MD;  Location: [facility];  Service: Open Heart Surgery;  Laterality: N/A;   TONSILLECTOMY  Current Medications: Current Meds  Medication Sig   aspirin 81 MG chewable tablet Chew by mouth daily.   benzonatate (TESSALON) 100 MG capsule Take 1 capsule (100 mg total) by mouth 3 (three) times daily as needed for cough.   Bismuth 262 MG CHEW Chew 524 mg by mouth in the morning, at noon, in the evening, and at bedtime.   colchicine 0.6 MG tablet Take 1 tablet (0.6 mg total) by mouth 2 (two) times daily.   Cranberry-Vitamin C 250-60 MG CAPS Take 2 tablets by mouth daily.   D-MANNOSE PO Take 2 tablets by mouth daily.    diclofenac (VOLTAREN) 75 MG EC tablet Take 1 tablet (75 mg total) by mouth 2 (two) times daily.   diphenhydrAMINE (BENADRYL) 25 MG tablet Take 25 mg by mouth at bedtime.   FIBER PO Take 2 tablets by mouth daily.   fluticasone (FLONASE) 50 MCG/ACT nasal spray Place 2 sprays into both nostrils daily.   levofloxacin (LEVAQUIN) 500 MG tablet Take 1 tablet (500 mg total) by mouth daily.   linaclotide (LINZESS) 290 MCG CAPS capsule Take 1 capsule (290 mcg total) by mouth daily before breakfast.   nitazoxanide (ALINIA) 500 MG tablet Take 1 tablet (500 mg total) by mouth 2 (two) times daily with a meal.   nitrofurantoin, macrocrystal-monohydrate, (MACROBID) 100 MG capsule Take 1 capsule (100 mg total) by mouth at bedtime as needed (after intercourse).   NON FORMULARY Take 0.5 tablets by mouth at bedtime. CBD oil - for pain Gummies   omeprazole (PRILOSEC) 40 MG capsule Take 1 capsule (40 mg total) by mouth daily.   pantoprazole (PROTONIX) 40 MG tablet TAKE 1 TABLET BY MOUTH EVERY DAY   Polyethyl Glycol-Propyl Glycol (SYSTANE OP) Place 1 drop into both eyes in the morning and at bedtime.   PREMARIN vaginal cream PLACE [id number] APPLICATORFULS VAGINALLY SEE ADMIN INSTRUCTIONS. INSERT 0.5 GRAMS VAGINALLY EVERY OTHER NIGHT   sodium polystyrene (KAYEXALATE) 15 GM/60ML suspension 60 ml po x 4 days   [DISCONTINUED] ezetimibe (ZETIA) 10 MG tablet Take 1 tablet (10 mg total) by mouth daily. Please keep scheduled appointment for additional refills.   [DISCONTINUED] rosuvastatin (CRESTOR) 10 MG tablet Take 1 tablet (10 mg total) by mouth daily.     Allergies:    Amoxicillin, Clopidogrel & aspirin, Amlodipine, Atorvastatin, Crestor [rosuvastatin], Metoprolol, Pravastatin, and Repatha [evolocumab]   Social History: Social History   Socioeconomic History   Marital status: Married    Spouse name: Not on file   Number of children: 1   Years of education: Not on file   Highest education level: Not on file   Occupational History   Occupation: unemployed at the time  Tobacco Use   Smoking status: Never   Smokeless tobacco: Never  Vaping Use   Vaping Use: Never used  Substance and Sexual Activity   Alcohol use: Not Currently    Alcohol/week: 0.0 standard drinks of alcohol    Comment: occassionally   Drug use: No    Comment: CBD gummies   Sexual activity: Yes  Other Topics Concern   Not on file  Social History Narrative   Not on file   Social Determinants of Health   Financial Resource Strain: Medium Risk (12/24/2021)   Overall Financial Resource Strain (CARDIA)    Difficulty of Paying Living Expenses: Somewhat hard  Food Insecurity: Food Insecurity Present (12/24/2021)   Hunger Vital Sign    Worried About Running Out of Food in the Last Year: Sometimes true  Ran Out of Food in the Last Year: Never true  Transportation Needs: No Transportation Needs (12/24/2021)   PRAPARE - Hydrologist (Medical): No    Lack of Transportation (Non-Medical): No  Physical Activity: Insufficiently Active (12/24/2021)   Exercise Vital Sign    Days of Exercise per Week: 3 days    Minutes of Exercise per Session: 30 min  Stress: Stress Concern Present (12/24/2021)   [place]    Feeling of Stress : To some extent  Social Connections: Moderately Isolated (12/24/2021)   Social Connection and Isolation Panel [NHANES]    Frequency of Communication with Friends and Family: More than three times a week    Frequency of Social Gatherings with Friends and Family: Once a week    Attends Religious Services: Never    Marine scientist or Organizations: No    Attends Music therapist: Never    Marital Status: Married     Family History: The patient's family history includes Cancer in her paternal aunt; Heart attack in her father and mother; Heart disease in her father and mother; Hypertension in her  mother; Rectal cancer (age of onset: 51) in her father; Stomach cancer in her paternal grandmother. There is no history of Colon cancer, Colon polyps, or Esophageal cancer.  ROS:   All other ROS reviewed and negative. Pertinent positives noted in the HPI.     EKGs/Labs/Other Studies Reviewed:   The following studies were personally reviewed by me today:  Recent Labs: 10/20/2022: ALT 20; BUN 11; Creatinine 0.74; Hemoglobin 12.3; Platelet Count 439; Potassium 4.8; Sodium 138   Recent Lipid Panel    Component Value Date/Time   CHOL 98 08/28/2022 1119   CHOL 110 04/09/2022 1122   TRIG 110.0 08/28/2022 1119   HDL 32.90 (L) 08/28/2022 1119   HDL 34 (L) 04/09/2022 1122   CHOLHDL 3 08/28/2022 1119   VLDL 22.0 08/28/2022 1119   LDLCALC 43 08/28/2022 1119   LDLCALC 52 04/09/2022 1122    Physical Exam:   VS:  BP 126/74   Pulse 77   Ht '5\' 3"'$  (1.6 m)   Wt 100 lb 12.8 oz (45.7 kg)   LMP  (LMP Unknown)   SpO2 97%   BMI 17.86 kg/m    Wt Readings from Last 3 Encounters:  10/31/22 100 lb 12.8 oz (45.7 kg)  10/20/22 100 lb (45.4 kg)  10/09/22 100 lb (45.4 kg)    General: Well nourished, well developed, in no acute distress Head: Atraumatic, normal size  Eyes: PEERLA, EOMI  Neck: Supple, no JVD Endocrine: No thryomegaly Cardiac: Normal S1, S2; RRR; no murmurs, rubs, or gallops Lungs: Clear to auscultation bilaterally, no wheezing, rhonchi or rales  Abd: Soft, nontender, no hepatomegaly  Ext: No edema, pulses 2+ Musculoskeletal: No deformities, BUE and BLE strength normal and equal Skin: Warm and dry, no rashes   Neuro: Alert and oriented to person, place, time, and situation, CNII-XII grossly intact, no focal deficits  Psych: Normal mood and affect   ASSESSMENT:   Chiyoko Fleisher is a 67 y.o. female who presents for the following: 1. Coronary artery disease involving coronary bypass graft of native heart without angina pectoris   2. S/P CABG x 4   3. Mixed hyperlipidemia     PLAN:    1. Coronary artery disease involving coronary bypass graft of native heart without angina pectoris 2. S/P CABG x 4 3.  Mixed hyperlipidemia -Status post CABG.  Doing well.  Continue aspirin indefinitely.  Denies any chest pain or trouble breathing.  Describes infrequent palpitations which are likely normal.  She is on Zetia as well as Crestor.  LDL cholesterol 43.  Recheck today.  All of her values are at goal.  She will see [place] yearly.  Disposition: Return in about 1 year (around 11/01/2023).  Medication Adjustments/Labs and Tests Ordered: Current medicines are reviewed at length with the patient today.  Concerns regarding medicines are outlined above.  Orders Placed This Encounter  Procedures   Lipid panel   Meds ordered this encounter  Medications   rosuvastatin (CRESTOR) 10 MG tablet    Sig: Take 1 tablet (10 mg total) by mouth daily.    Dispense:  90 tablet    Refill:  3    Change in direction.  please  fill   ezetimibe (ZETIA) 10 MG tablet    Sig: Take 1 tablet (10 mg total) by mouth daily. Please keep scheduled appointment for additional refills.    Dispense:  90 tablet    Refill:  3    Patient Instructions  Medication Instructions:  The current medical regimen is effective;  continue present plan and medications.  *If you need a refill on your cardiac medications before your next appointment, please call your pharmacy*   Lab Work: LIPID today   If you have labs (blood work) drawn today and your tests are completely normal, you will receive your results only by: [place] (if you have MyChart) OR A paper copy in the mail If you have any lab test that is abnormal or we need to change your treatment, we will call you to review the results.   Follow-Up: At [facility], you and your health needs are our priority.  As part of our continuing mission to provide you with exceptional heart care, we have created designated Provider Care Teams.  These Care Teams  include your primary Cardiologist (physician) and Advanced Practice Providers (APPs -  Physician Assistants and Nurse Practitioners) who all work together to provide you with the care you need, when you need it.  We recommend signing up for the patient portal called "MyChart".  Sign up information is provided on this After Visit Summary.  MyChart is used to connect with patients for Virtual Visits (Telemedicine).  Patients are able to view lab/test results, encounter notes, upcoming appointments, etc.  Non-urgent messages can be sent to your provider as well.   To learn more about what you can do with MyChart, go to [email or web address].    Your next appointment:   12 month(s)  Provider:   Evalina Field, MD       Time Spent with Patient: I have spent a total of 25 minutes with patient reviewing hospital notes, telemetry, EKGs, labs and examining the patient as well as establishing an assessment and plan that was discussed with the patient.  > 50% of time was spent in direct patient care.  Signed, Addison Naegeli. Audie Box, MD, [place]  [address], [place], [place] [address] [phone]  10/31/2022 11:06 AM

## 2022-10-27 NOTE — Telephone Encounter (Signed)
In that case, we will try LOAD therapy which is the following:  - Levofloxacin 500 mg daily x 7 days - Nitazoxanide 500 mg p.o. twice daily x 10 days - Doxycycline 100 mg p.o. twice daily x 10 days - Omeprazole 40 mg p.o. twice daily x 10 days

## 2022-10-28 ENCOUNTER — Other Ambulatory Visit: Payer: Self-pay

## 2022-10-28 MED ORDER — OMEPRAZOLE 40 MG PO CPDR: 40.0000 mg | DELAYED_RELEASE_CAPSULE | Freq: Every day | ORAL | 0 refills | Status: DC

## 2022-10-28 MED ORDER — LEVOFLOXACIN 500 MG PO TABS: 500.0000 mg | ORAL_TABLET | Freq: Every day | ORAL | 0 refills | Status: DC

## 2022-10-28 MED ORDER — NITAZOXANIDE 500 MG PO TABS: 500.0000 mg | ORAL_TABLET | Freq: Two times a day (BID) | ORAL | 0 refills | Status: AC

## 2022-10-28 NOTE — Telephone Encounter (Signed)
Pt stated she did not have an allergy to aspirin or plavix. Prescriptions sent to pharmacy. Pt stated she did not need doxycycline sent to pharmacy because she still had the last prescription. Let pt know that this time she would only need to take doxycyline for 10 days. Pt verbalized understanding.

## 2022-10-28 NOTE — Telephone Encounter (Signed)
Attempted to put in orders but Levfloxacin, nitazoxanide, and omeprazole are being flagged because pt has an allergy to aspirin and plavix. Called pt to find out more info about allergy. Left voicemail for pt to call back.

## 2022-10-31 ENCOUNTER — Ambulatory Visit: Payer: Medicare Other | Attending: Cardiovascular Disease | Admitting: Cardiovascular Disease

## 2022-10-31 ENCOUNTER — Encounter: Payer: Self-pay | Admitting: Cardiovascular Disease

## 2022-10-31 VITALS — BP 126/74 | HR 77 | Ht 63.0 in | Wt 100.8 lb

## 2022-10-31 DIAGNOSIS — Z951 Presence of aortocoronary bypass graft: Secondary | ICD-10-CM

## 2022-10-31 DIAGNOSIS — E782 Mixed hyperlipidemia: Secondary | ICD-10-CM | POA: Diagnosis not present

## 2022-10-31 DIAGNOSIS — I25810 Atherosclerosis of coronary artery bypass graft(s) without angina pectoris: Secondary | ICD-10-CM

## 2022-10-31 LAB — LIPID PANEL
Chol/HDL Ratio: 2.7 ratio (ref 0.0–4.4)
Cholesterol, Total: 108 mg/dL (ref 100–199)
HDL: 40 mg/dL (ref >39)
LDL Chol Calc (NIH): 51 mg/dL (ref 0–99)
Triglycerides: 86 mg/dL (ref 0–149)
VLDL Cholesterol Cal: 17 mg/dL (ref 5–40)

## 2022-10-31 MED ORDER — EZETIMIBE 10 MG PO TABS: 10.0000 mg | ORAL_TABLET | Freq: Every day | ORAL | 3 refills | Status: DC

## 2022-10-31 MED ORDER — ROSUVASTATIN CALCIUM 10 MG PO TABS: 10.0000 mg | ORAL_TABLET | Freq: Every day | ORAL | 3 refills | Status: DC

## 2022-10-31 NOTE — Patient Instructions (Signed)
Medication Instructions:  The current medical regimen is effective;  continue present plan and medications.  *If you need a refill on your cardiac medications before your next appointment, please call your pharmacy*   Lab Work: LIPID today   If you have labs (blood work) drawn today and your tests are completely normal, you will receive your results only by: [place] (if you have MyChart) OR A paper copy in the mail If you have any lab test that is abnormal or we need to change your treatment, we will call you to review the results.   Follow-Up: At [facility], you and your health needs are our priority.  As part of our continuing mission to provide you with exceptional heart care, we have created designated Provider Care Teams.  These Care Teams include your primary Cardiologist (physician) and Advanced Practice Providers (APPs -  Physician Assistants and Nurse Practitioners) who all work together to provide you with the care you need, when you need it.  We recommend signing up for the patient portal called "MyChart".  Sign up information is provided on this After Visit Summary.  MyChart is used to connect with patients for Virtual Visits (Telemedicine).  Patients are able to view lab/test results, encounter notes, upcoming appointments, etc.  Non-urgent messages can be sent to your provider as well.   To learn more about what you can do with MyChart, go to [email or web address].    Your next appointment:   12 month(s)  Provider:   Evalina Field, MD

## 2022-11-02 ENCOUNTER — Encounter: Payer: Self-pay | Admitting: Gastroenterology

## 2022-11-04 MED ORDER — ONDANSETRON HCL 4 MG PO TABS: 4.0000 mg | ORAL_TABLET | Freq: Three times a day (TID) | ORAL | 1 refills | Status: DC | PRN

## 2022-11-05 ENCOUNTER — Encounter: Payer: Medicare Other | Admitting: Gastroenterology

## 2022-11-19 ENCOUNTER — Telehealth: Payer: Self-pay | Admitting: *Deleted

## 2022-11-19 NOTE — Telephone Encounter (Signed)
Called patient and lvm about rescheduling appointment and gave new date & time - requested call back to confirm.

## 2022-11-20 ENCOUNTER — Other Ambulatory Visit: Payer: Medicare Other

## 2022-11-20 ENCOUNTER — Inpatient Hospital Stay: Payer: Medicare Other | Admitting: Medical Oncology

## 2022-11-24 ENCOUNTER — Inpatient Hospital Stay: Payer: Medicare Other

## 2022-11-24 ENCOUNTER — Inpatient Hospital Stay (HOSPITAL_BASED_OUTPATIENT_CLINIC_OR_DEPARTMENT_OTHER): Payer: Medicare Other | Admitting: Medical Oncology

## 2022-11-24 ENCOUNTER — Inpatient Hospital Stay: Payer: Medicare Other | Attending: Hematology & Oncology

## 2022-11-24 VITALS — BP 125/51 | HR 72

## 2022-11-24 VITALS — BP 119/75 | HR 84 | Temp 98.0°F | Resp 18

## 2022-11-24 VITALS — BP 130/77 | HR 92 | Temp 98.0°F | Resp 17 | Ht 63.0 in | Wt 101.0 lb

## 2022-11-24 DIAGNOSIS — D509 Iron deficiency anemia, unspecified: Secondary | ICD-10-CM

## 2022-11-24 DIAGNOSIS — D45 Polycythemia vera: Secondary | ICD-10-CM

## 2022-11-24 DIAGNOSIS — D649 Anemia, unspecified: Secondary | ICD-10-CM

## 2022-11-24 DIAGNOSIS — Z7982 Long term (current) use of aspirin: Secondary | ICD-10-CM | POA: Diagnosis not present

## 2022-11-24 LAB — IRON AND IRON BINDING CAPACITY (CC-WL,HP ONLY)
Iron: 28 ug/dL (ref 28–170)
Saturation Ratios: 6 % — ABNORMAL LOW (ref 10.4–31.8)
TIBC: 482 ug/dL — ABNORMAL HIGH (ref 250–450)
UIBC: 454 ug/dL — ABNORMAL HIGH (ref 148–442)

## 2022-11-24 LAB — CBC WITH DIFFERENTIAL (CANCER CENTER ONLY)
Abs Immature Granulocytes: 0.09 10*3/uL — ABNORMAL HIGH (ref 0.00–0.07)
Basophils Absolute: 0.1 10*3/uL (ref 0.0–0.1)
Basophils Relative: 1 %
Eosinophils Absolute: 0.3 10*3/uL (ref 0.0–0.5)
Eosinophils Relative: 2 %
HCT: 45.5 % (ref 36.0–46.0)
Hemoglobin: 13.2 g/dL (ref 12.0–15.0)
Immature Granulocytes: 1 %
Lymphocytes Relative: 15 %
Lymphs Abs: 2 10*3/uL (ref 0.7–4.0)
MCH: 21 pg — ABNORMAL LOW (ref 26.0–34.0)
MCHC: 29 g/dL — ABNORMAL LOW (ref 30.0–36.0)
MCV: 72.2 fL — ABNORMAL LOW (ref 80.0–100.0)
Monocytes Absolute: 0.7 10*3/uL (ref 0.1–1.0)
Monocytes Relative: 5 %
Neutro Abs: 10.4 10*3/uL — ABNORMAL HIGH (ref 1.7–7.7)
Neutrophils Relative %: 76 %
Platelet Count: 473 10*3/uL — ABNORMAL HIGH (ref 150–400)
RBC: 6.3 MIL/uL — ABNORMAL HIGH (ref 3.87–5.11)
RDW: 19.7 % — ABNORMAL HIGH (ref 11.5–15.5)
WBC Count: 13.5 10*3/uL — ABNORMAL HIGH (ref 4.0–10.5)
nRBC: 0 % (ref 0.0–0.2)

## 2022-11-24 LAB — RETICULOCYTES
Immature Retic Fract: 15 % (ref 2.3–15.9)
RBC.: 6.18 MIL/uL — ABNORMAL HIGH (ref 3.87–5.11)
Retic Count, Absolute: 95.1 10*3/uL (ref 19.0–186.0)
Retic Ct Pct: 1.5 % (ref 0.4–3.1)

## 2022-11-24 LAB — CMP (CANCER CENTER ONLY)
ALT: 17 U/L (ref 0–44)
AST: 26 U/L (ref 15–41)
Albumin: 4.9 g/dL (ref 3.5–5.0)
Alkaline Phosphatase: 70 U/L (ref 38–126)
Anion gap: 9 (ref 5–15)
BUN: 14 mg/dL (ref 8–23)
CO2: 28 mmol/L (ref 22–32)
Calcium: 10.3 mg/dL (ref 8.9–10.3)
Chloride: 99 mmol/L (ref 98–111)
Creatinine: 0.92 mg/dL (ref 0.44–1.00)
GFR, Estimated: >60 mL/min
Glucose, Bld: 147 mg/dL — ABNORMAL HIGH (ref 70–99)
Potassium: 5 mmol/L (ref 3.5–5.1)
Sodium: 136 mmol/L (ref 135–145)
Total Bilirubin: 0.7 mg/dL (ref 0.3–1.2)
Total Protein: 8.5 g/dL — ABNORMAL HIGH (ref 6.5–8.1)

## 2022-11-24 LAB — FERRITIN: Ferritin: 7 ng/mL — ABNORMAL LOW (ref 11–307)

## 2022-11-24 LAB — LACTATE DEHYDROGENASE: LDH: 147 U/L (ref 98–192)

## 2022-11-24 MED ORDER — SODIUM CHLORIDE 0.9 % IV SOLN: INTRAVENOUS | Status: DC

## 2022-11-24 MED ORDER — ONDANSETRON HCL 8 MG PO TABS
4.0000 mg | ORAL_TABLET | Freq: Once | ORAL | Status: AC
  Administered 2022-11-24: 4 mg via ORAL

## 2022-11-24 NOTE — Progress Notes (Signed)
Hematology and Oncology Follow Up Visit  Katrina Walters [id number] 07-03-1956 67 y.o. 11/24/2022   Principle Diagnosis:   Polycythemia Vera -- JAK2 (+)    Current Therapy:        Phlebotomy to maintain medical below 45% Aspirin 81 mg p.o. daily   Interim History:  Katrina Walters is here today for follow-up. She does have polycythemia vera- JAK2 positive. She has been phlebotomized.  Her hemoglobin is come down quite nicely.  She reports that overall she has been feeling just ok. She thinks she is going to need a phlebotomy as she has been more fatigued, itching when she showers, and gets a low grade fever with too much activity.   Her last iron studies that were done back in February showed a ferritin of 7 with an iron saturation of 5%.  She has had no rashes.  Overall, I would say that her performance status is probably ECOG 1. Wt Readings from Last 3 Encounters:  11/24/22 101 lb (45.8 kg)  10/31/22 100 lb 12.8 oz (45.7 kg)  10/20/22 100 lb (45.4 kg)     Medications:  Allergies as of 11/24/2022       Reactions   Amoxicillin    Had fever with flu like symptoms. Has patient had a PCN reaction causing immediate rash, facial/tongue/throat swelling, SOB or lightheadedness with hypotension: no Has patient had a PCN reaction causing severe rash involving mucus membranes or skin necrosis: no Has patient had a PCN reaction that required hospitalization no Has patient had a PCN reaction occurring within the last 10 years: yes If all of the above answers are "NO", then may proceed with Cephalosporin use.   Clopidogrel & Aspirin Itching   Amlodipine Nausea Only   Dizzy, heavy feeling, tongue burning   Atorvastatin Other (See Comments)   Myalgia   Crestor [rosuvastatin] Itching   Metoprolol Other (See Comments)   hair loss   Pravastatin Itching   Repatha [evolocumab] Itching        Medication List        Accurate as of November 24, 2022  2:41 PM. If you have any questions, ask  your nurse or doctor.          STOP taking these medications    benzonatate 100 MG capsule Commonly known as: TESSALON Stopped by: Hughie Closs, PA-C       TAKE these medications    aspirin 81 MG chewable tablet Chew by mouth daily.   Bismuth 262 MG Chew Chew 524 mg by mouth in the morning, at noon, in the evening, and at bedtime.   colchicine 0.6 MG tablet Take 1 tablet (0.6 mg total) by mouth 2 (two) times daily.   Cranberry-Vitamin C 250-60 MG Caps Take 2 tablets by mouth daily.   D-MANNOSE PO Take 2 tablets by mouth daily.   diclofenac 75 MG EC tablet Commonly known as: VOLTAREN Take 1 tablet (75 mg total) by mouth 2 (two) times daily.   diphenhydrAMINE 25 MG tablet Commonly known as: BENADRYL Take 25 mg by mouth at bedtime.   ezetimibe 10 MG tablet Commonly known as: ZETIA Take 1 tablet (10 mg total) by mouth daily. Please keep scheduled appointment for additional refills.   FIBER PO Take 2 tablets by mouth daily.   fluticasone 50 MCG/ACT nasal spray Commonly known as: FLONASE Place 2 sprays into both nostrils daily.   levofloxacin 500 MG tablet Commonly known as: LEVAQUIN Take 1 tablet (500 mg total) by mouth daily.  linaclotide 290 MCG Caps capsule Commonly known as: LINZESS Take 1 capsule (290 mcg total) by mouth daily before breakfast.   nitazoxanide 500 MG tablet Commonly known as: ALINIA Take 1 tablet (500 mg total) by mouth 2 (two) times daily with a meal.   nitrofurantoin (macrocrystal-monohydrate) 100 MG capsule Commonly known as: MACROBID Take 1 capsule (100 mg total) by mouth at bedtime as needed (after intercourse).   NON FORMULARY Take 0.5 tablets by mouth at bedtime. CBD oil - for pain Gummies   omeprazole 40 MG capsule Commonly known as: PRILOSEC Take 1 capsule (40 mg total) by mouth daily.   ondansetron 4 MG tablet Commonly known as: ZOFRAN Take 1 tablet (4 mg total) by mouth every 8 (eight) hours as needed for  nausea or vomiting.   pantoprazole 40 MG tablet Commonly known as: PROTONIX TAKE 1 TABLET BY MOUTH EVERY DAY   Premarin vaginal cream Generic drug: conjugated estrogens PLACE [id number] APPLICATORFULS VAGINALLY SEE ADMIN INSTRUCTIONS. INSERT 0.5 GRAMS VAGINALLY EVERY OTHER NIGHT   rosuvastatin 10 MG tablet Commonly known as: CRESTOR Take 1 tablet (10 mg total) by mouth daily.   sodium polystyrene 15 GM/60ML suspension Commonly known as: KAYEXALATE 60 ml po x 4 days   SYSTANE OP Place 1 drop into both eyes in the morning and at bedtime.        Allergies:  Allergies  Allergen Reactions   Amoxicillin     Had fever with flu like symptoms. Has patient had a PCN reaction causing immediate rash, facial/tongue/throat swelling, SOB or lightheadedness with hypotension: no Has patient had a PCN reaction causing severe rash involving mucus membranes or skin necrosis: no Has patient had a PCN reaction that required hospitalization no Has patient had a PCN reaction occurring within the last 10 years: yes If all of the above answers are "NO", then may proceed with Cephalosporin use.   Clopidogrel & Aspirin Itching   Amlodipine Nausea Only    Dizzy, heavy feeling, tongue burning   Atorvastatin Other (See Comments)    Myalgia   Crestor [Rosuvastatin] Itching   Metoprolol Other (See Comments)    hair loss   Pravastatin Itching   Repatha [Evolocumab] Itching    Past Medical History, Surgical history, Social history, and Family History were reviewed and updated.  Review of Systems: Review of Systems  Constitutional:  Positive for diaphoresis and fever.  HENT: Negative.    Eyes: Negative.   Respiratory: Negative.    Cardiovascular: Negative.   Gastrointestinal: Negative.   Genitourinary: Negative.   Musculoskeletal: Negative.   Skin: Negative.   Neurological: Negative.   Endo/Heme/Allergies: Negative.   Psychiatric/Behavioral: Negative.       Physical Exam: Vitals:   11/24/22  1127  BP: 130/77  Pulse: 92  Resp: 17  Temp: 98 F (36.7 C)  SpO2: 99%     Wt Readings from Last 3 Encounters:  11/24/22 101 lb (45.8 kg)  10/31/22 100 lb 12.8 oz (45.7 kg)  10/20/22 100 lb (45.4 kg)    Physical Exam Vitals reviewed.  HENT:     Head: Normocephalic and atraumatic.  Eyes:     Pupils: Pupils are equal, round, and reactive to light.  Cardiovascular:     Rate and Rhythm: Normal rate and regular rhythm.     Heart sounds: Normal heart sounds.  Pulmonary:     Effort: Pulmonary effort is normal.     Breath sounds: Normal breath sounds.  Abdominal:     General: Bowel sounds  are normal.     Palpations: Abdomen is soft.  Musculoskeletal:        General: No tenderness or deformity. Normal range of motion.     Cervical back: Normal range of motion.  Lymphadenopathy:     Cervical: No cervical adenopathy.  Skin:    General: Skin is warm and dry.     Findings: No erythema or rash.  Neurological:     Mental Status: She is alert and oriented to person, place, and time.  Psychiatric:        Behavior: Behavior normal.        Thought Content: Thought content normal.        Judgment: Judgment normal.      Lab Results  Component Value Date   WBC 13.5 (H) 11/24/2022   HGB 13.2 11/24/2022   HCT 45.5 11/24/2022   MCV 72.2 (L) 11/24/2022   PLT 473 (H) 11/24/2022   Lab Results  Component Value Date   FERRITIN 7 (L) 10/20/2022   IRON 20 (L) 10/20/2022   TIBC 403 10/20/2022   UIBC 383 10/20/2022   IRONPCTSAT 5 (L) 10/20/2022   Lab Results  Component Value Date   RETICCTPCT 1.5 11/24/2022   RBC 6.18 (H) 11/24/2022   RBC 6.30 (H) 11/24/2022   No results found for: "KPAFRELGTCHN", "LAMBDASER", "KAPLAMBRATIO" No results found for: "IGGSERUM", "IGA", "IGMSERUM" No results found for: "TOTALPROTELP", "ALBUMINELP", "A1GS", "A2GS", "BETS", "BETA2SER", "GAMS", "MSPIKE", "SPEI"   Chemistry      Component Value Date/Time   NA 136 11/24/2022 1038   NA 139 04/15/2021  1027   K 5.0 11/24/2022 1038   CL 99 11/24/2022 1038   CO2 28 11/24/2022 1038   BUN 14 11/24/2022 1038   BUN 14 04/15/2021 1027   CREATININE 0.92 11/24/2022 1038   CREATININE 0.84 07/14/2016 0914      Component Value Date/Time   CALCIUM 10.3 11/24/2022 1038   ALKPHOS 70 11/24/2022 1038   AST 26 11/24/2022 1038   ALT 17 11/24/2022 1038   BILITOT 0.7 11/24/2022 1038      Encounter Diagnoses  Name Primary?   Polycythemia vera (HCC) Yes   Iron deficiency anemia, unspecified iron deficiency anemia type     Impression and Plan: Katrina Walters is a very pleasant 67 yo caucasian female with polycythemia vera.  She is JAK2 positive.  She has been phlebotomized.   Phlebotomy today Elevated platelets likely secondary to IDA.  Elevated WBC inflammatory vs other- If low grade fevers after activity do not get better with phlebotomy may consider bone marrow biopsy in the future.   RTC 1 month MD, labs +- Phlebotomy with IVF replacement   *update: As she was leaving the office following her phlebotomy, she felt woozy. Appeared to be near vasovagal. She was escorted back to the infusion area and received IVF along with zofran for some nausea. She improved back to baseline prior to phlebotomy and was discharged. Precautions given.   Hughie Closs, PA-C 3/18/20242:41 PM

## 2022-11-24 NOTE — Patient Instructions (Signed)

## 2022-11-24 NOTE — Progress Notes (Signed)
Katrina Walters presents today for phlebotomy per MD orders. Phlebotomy procedure started at 1213 and ended at 1219. 547 grams removed via 16 gauge needle to left AC using phlebotomy kit. Patient observed for 30 minutes after procedure without any incident. Patient tolerated procedure well. IV needle removed intact.

## 2022-11-27 ENCOUNTER — Ambulatory Visit: Payer: Medicare Other | Admitting: Gastroenterology

## 2022-12-11 ENCOUNTER — Telehealth: Payer: Self-pay

## 2022-12-11 DIAGNOSIS — A048 Other specified bacterial intestinal infections: Secondary | ICD-10-CM

## 2022-12-11 NOTE — Telephone Encounter (Signed)
Patient reviewed MyChart message.  Last read by [name] at  1:13 PM on 12/11/2022.

## 2022-12-11 NOTE — Telephone Encounter (Signed)
-----   Message from Algernon Huxley, RN sent at 12/11/2022 11:59 AM EDT ----- Regarding: FW: H pylori stool  ----- Message ----- From: Algernon Huxley, RN Sent: 11/28/2022  12:00 AM EDT To: Marice Potter, RN Subject: H pylori stool                                 Needs h pylori stool, enter order

## 2022-12-11 NOTE — Telephone Encounter (Signed)
MyChart message sent to patient with lab reminder. Order in epic.

## 2022-12-15 ENCOUNTER — Ambulatory Visit: Payer: Medicare Other | Admitting: Gastroenterology

## 2022-12-15 ENCOUNTER — Encounter: Payer: Self-pay | Admitting: Gastroenterology

## 2022-12-15 VITALS — BP 118/68 | HR 86 | Ht 63.0 in | Wt 101.0 lb

## 2022-12-15 DIAGNOSIS — K5909 Other constipation: Secondary | ICD-10-CM | POA: Diagnosis not present

## 2022-12-15 DIAGNOSIS — B9681 Helicobacter pylori [H. pylori] as the cause of diseases classified elsewhere: Secondary | ICD-10-CM | POA: Diagnosis not present

## 2022-12-15 DIAGNOSIS — K2970 Gastritis, unspecified, without bleeding: Secondary | ICD-10-CM

## 2022-12-15 DIAGNOSIS — K648 Other hemorrhoids: Secondary | ICD-10-CM

## 2022-12-15 DIAGNOSIS — K581 Irritable bowel syndrome with constipation: Secondary | ICD-10-CM | POA: Diagnosis not present

## 2022-12-15 DIAGNOSIS — K219 Gastro-esophageal reflux disease without esophagitis: Secondary | ICD-10-CM

## 2022-12-15 DIAGNOSIS — Z860100 Personal history of colon polyps, unspecified: Secondary | ICD-10-CM

## 2022-12-15 DIAGNOSIS — R12 Heartburn: Secondary | ICD-10-CM

## 2022-12-15 NOTE — Progress Notes (Signed)
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  Previously trialed Dulcolax qod and increased hydration without much response, MiraLAX. - 02/2020: Abdominal x-ray with abundant stool burden.  Trialed Amitiza in 02/2020 with good efficacy but stopped due to upset stomach. - 03/2020: Started on Linzess with good clinical response, but reduced efficacy in early 2022.  (Interestingly, constipation started after hysterectomy in 2020.  Prior baseline was BM Q3d without straining) -10/2020: Sitz marker study: 10 markers remain with 9 in the transverse colon indicating slow colonic transit.  Started Trulance -11/2020: ARM: Decreased resting pressure with normal squeeze.  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  Referred to PT - 11/2020: Trulance was suboptimally efficacious.  Stopped and went back to Linzess 290 mcg -09/2022: GI follow-up appointment.  Completed pelvic floor PT and constipation well-controlled with Linzess with BM every 1-2 days without straining.  Was having BRB on tissue paper.  New issue was reflux, well-controlled with omeprazole 40 mg daily. - 10/09/2022: EGD: 1 cm sliding HH with Hill grade 3 valve, mild gastritis (path: H. pylori positive).  Normal duodenum (path benign)   History of internal hemorrhoids with intermittent scant BRBPR, typically responsive to OTC Preparation H.   Family history notable for father with colon cancer in his 69s.   Endoscopic history: -Colonoscopy (08/17/2014, Dr. Clearence Cheek at [facility]): Single small polypoid polyp at splenic flexure removed with snare (path: Unknown), Internal hemorrhoids. -Colonoscopy (03/2020, Dr. Barron Alvine):  2 rectal polyps (1 tubular adenoma), tortuous sigmoid colon, lipoma in the ascending colon, internal hemorrhoids.  Repeat in 7 years  HPI:     Patient is a 67 y.o. female presenting to the Gastroenterology Clinic for follow-up.  Was last seen by me on 10/08/2022.  Constipation was controlled with Linzess and after having completed pelvic floor PT.  Discussed hemorrhoid banding for symptomatic hemorrhoids.  New issue of reflux with good response to omeprazole 40 mg daily.  Was referred for EGD, which was notable for H. pylori gastritis.  She did not tolerate initial round of quadruple therapy (SOB, dizziness; took only 1 day).  Tried prescribing Pylera, but due to bismuth component and her aspirin allergy (itching), this was flagged and not administered.  Could not give levofloxacin triple therapy due to amoxicillin allergy.  Instead decided to retrial quadruple therapy with Zofran 4 mg scheduled every 8 hours with plan for ID referral if still unable to tolerate.  She again tried quadruple therapy, but stopped after 1 day due to nausea, fatigue, and "food feeling heavy".  Symptoms resolved after stopping antibiotic.  Separately, she has had recent return of constipation despite continued use of Linzess and hydration. Has tried laxative when >3 days between BM.   Continues to undergo therapeutic phlebotomy through the Hematology clinic for polycythemia vera with JAK2 positive.   Review of systems:     No chest pain, no SOB, no fevers, no urinary sx   Past Medical History:  Diagnosis Date   Allergy    Cataract    Complication of anesthesia    Coronary artery disease    Eating disorder    Genital warts    Hyperlipidemia    Mediterranean fever    diagnosed ~ age 63 years in  [place], managed on colchicine   Polycythemia vera    PONV (postoperative nausea and vomiting)     Patient's surgical history, family medical history, social history, medications and allergies were all reviewed in Epic     Current Outpatient Medications  Medication Sig Dispense Refill   aspirin 81 MG chewable tablet Chew by mouth daily.     Bismuth 262 MG CHEW Chew 524 mg by mouth in the morning, at noon, in the evening, and at bedtime. 112 tablet 0   colchicine 0.6 MG tablet Take 1 tablet (0.6 mg total) by mouth 2 (two) times daily. 180 tablet 3   Cranberry-Vitamin C 250-60 MG CAPS Take 2 tablets by mouth daily.     D-MANNOSE PO Take 2 tablets by mouth daily.     diclofenac (VOLTAREN) 75 MG EC tablet Take 1 tablet (75 mg total) by mouth 2 (two) times daily. 20 tablet 0   diphenhydrAMINE (BENADRYL) 25 MG tablet Take 25 mg by mouth at bedtime.     ezetimibe (ZETIA) 10 MG tablet Take 1 tablet (10 mg total) by mouth daily. Please keep scheduled appointment for additional refills. 90 tablet 3   FIBER PO Take 2 tablets by mouth daily.     linaclotide (LINZESS) 290 MCG CAPS capsule Take 1 capsule (290 mcg total) by mouth daily before breakfast. 90 capsule 5   nitazoxanide (ALINIA) 500 MG tablet Take 1 tablet (500 mg total) by mouth 2 (two) times daily with a meal. 20 tablet 0   nitrofurantoin, macrocrystal-monohydrate, (MACROBID) 100 MG capsule Take 1 capsule (100 mg total) by mouth at bedtime as needed (after intercourse). 90 capsule 3   NON FORMULARY Take 0.5 tablets by mouth at bedtime. CBD oil - for pain Gummies     omeprazole (PRILOSEC) 40 MG capsule Take 1 capsule (40 mg total) by mouth daily. 20 capsule 0   ondansetron (ZOFRAN) 4 MG tablet Take 1 tablet (4 mg total) by mouth every 8 (eight) hours as needed for nausea or vomiting. 30 tablet 1   pantoprazole (PROTONIX) 40 MG tablet TAKE 1 TABLET BY MOUTH EVERY DAY 90 tablet 1   Polyethyl Glycol-Propyl Glycol (SYSTANE OP) Place 1 drop into both eyes in the morning and at bedtime.     PREMARIN vaginal cream PLACE 0.25 APPLICATORFULS VAGINALLY SEE ADMIN INSTRUCTIONS. INSERT 0.5 GRAMS VAGINALLY EVERY OTHER NIGHT 30 g 12   rosuvastatin (CRESTOR) 10 MG tablet Take  1 tablet (10 mg total) by mouth daily. 90 tablet 3   No current facility-administered medications for this visit.    Physical Exam:     BP 118/68   Pulse 86   Ht 5\' 3"  (1.6 m)   Wt 101 lb (45.8 kg)   LMP  (LMP Unknown)   BMI 17.89 kg/m   GENERAL:  Pleasant female in NAD PSYCH: : Cooperative, normal affect Musculoskeletal:  Normal muscle tone, normal strength NEURO: Alert and oriented x 3, no focal neurologic deficits   IMPRESSION and PLAN:    1) H. pylori gastritis Has not tolerated trial of quadruple therapy x 2.  Pylera was not given, and has amoxicillin allergy so levofloxacin based therapy also not administered. - Will check [employer] panel in the event that the antibiotics she was able to tolerate was effective at H. pylori clearance.  If still positive, may need to refer to the ID clinic for consideration of alternative treatment options  2) IBS-C Has been doing well with Linzess.  Suspect changes in bowel habits  could be related to some disruption in microbiome with recent antibiotics - Start with MiraLAX 1 cap daily and titrate to soft stools. - Continue Linzess - Start probiotic (i.e. Align) for the next 4 weeks - I asked her to update me in the next 2 weeks or so  3) Internal hemorrhoids 4) Symptomatic hemorrhoids - Plan for hemorrhoid banding  5) Heartburn 6) GERD - Well-controlled with omeprazole  7) History of colon polyps - Repeat colonoscopy in 2028 for ongoing polyp surveillance  I spent over 30 minutes of time, including in depth chart review, independent review of results as outlined above, communicating results with the patient directly, face-to-face time with the patient, coordinating care, and ordering studies and medications as appropriate, and documentation.           Shellia CleverlyVito V Khayri Kargbo ,DO, FACG 12/15/2022, 10:34 AM

## 2022-12-15 NOTE — Patient Instructions (Signed)
Return stool test kit to our lab downstairs at the basement.  Take Miralax 1 capful daily.  Take Probiotic (Align) daily.  Keep your appointment for Hemorrhoid Banding on 12/23/22.  _______________________________________________________  If your blood pressure at your visit was 140/90 or greater, please contact your primary care physician to follow up on this.  _______________________________________________________  If you are age 66 or older, your body mass index should be between 23-30. Your Body mass index is 17.89 kg/m. If this is out of the aforementioned range listed, please consider follow up with your Primary Care Provider.  __________________________________________________________  The [facility] providers would like to encourage you to use [facility] to communicate with providers for non-urgent requests or questions.  Due to long hold times on the telephone, sending your provider a message by [facility] may be a faster and more efficient way to get a response.  Please allow 48 business hours for a response.  Please remember that this is for non-urgent requests.   Due to recent changes in healthcare laws, you may see the results of your imaging and laboratory studies on MyChart before your provider has had a chance to review them.  We understand that in some cases there may be results that are confusing or concerning to you. Not all laboratory results come back in the same time frame and the provider may be waiting for multiple results in order to interpret others.  Please give [place] 48 hours in order for your provider to thoroughly review all the results before contacting the office for clarification of your results.    Thank you for choosing me and [facility].  Vito Cirigliano, D.O.

## 2022-12-16 ENCOUNTER — Encounter: Payer: Self-pay | Admitting: Medical

## 2022-12-16 ENCOUNTER — Other Ambulatory Visit: Payer: Self-pay | Admitting: Medical

## 2022-12-17 MED ORDER — DICLOFENAC SODIUM 75 MG PO TBEC: 75.0000 mg | DELAYED_RELEASE_TABLET | Freq: Two times a day (BID) | ORAL | 0 refills | Status: DC

## 2022-12-22 DIAGNOSIS — K2970 Gastritis, unspecified, without bleeding: Secondary | ICD-10-CM | POA: Diagnosis not present

## 2022-12-23 ENCOUNTER — Ambulatory Visit (INDEPENDENT_AMBULATORY_CARE_PROVIDER_SITE_OTHER): Payer: Medicare Other | Admitting: Gastroenterology

## 2022-12-23 ENCOUNTER — Other Ambulatory Visit: Payer: Medicare Other

## 2022-12-23 ENCOUNTER — Encounter: Payer: Self-pay | Admitting: Gastroenterology

## 2022-12-23 VITALS — BP 118/68 | HR 98 | Ht 63.0 in | Wt 100.0 lb

## 2022-12-23 DIAGNOSIS — K581 Irritable bowel syndrome with constipation: Secondary | ICD-10-CM

## 2022-12-23 DIAGNOSIS — A048 Other specified bacterial intestinal infections: Secondary | ICD-10-CM

## 2022-12-23 DIAGNOSIS — K641 Second degree hemorrhoids: Secondary | ICD-10-CM | POA: Diagnosis not present

## 2022-12-23 DIAGNOSIS — B9681 Helicobacter pylori [H. pylori] as the cause of diseases classified elsewhere: Secondary | ICD-10-CM

## 2022-12-23 DIAGNOSIS — K219 Gastro-esophageal reflux disease without esophagitis: Secondary | ICD-10-CM

## 2022-12-23 DIAGNOSIS — Z860100 Personal history of colon polyps, unspecified: Secondary | ICD-10-CM

## 2022-12-23 NOTE — Progress Notes (Signed)
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  Previously trialed Dulcolax qod and increased hydration without much response, MiraLAX. - 02/2020: Abdominal x-ray with abundant stool burden.  Trialed Amitiza in 02/2020 with good efficacy but stopped due to upset stomach. - 03/2020: Started on Linzess with good clinical response, but reduced efficacy in early 2022.  (Interestingly, constipation started after hysterectomy in 2020.  Prior baseline was BM Q3d without straining) -10/2020: Sitz marker study: 10 markers remain with 9 in the transverse colon indicating slow colonic transit.  Started Trulance -11/2020: ARM: Decreased resting pressure with normal squeeze.  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  Referred to PT - 11/2020: Trulance was suboptimally efficacious.  Stopped and went back to Linzess 290 mcg -09/2022: GI follow-up appointment.  Completed pelvic floor PT and constipation well-controlled with Linzess with BM every 1-2 days without straining.  Was having BRB on tissue paper.  New issue was reflux, well-controlled with omeprazole 40 mg daily. - 10/09/2022: EGD: 1 cm sliding HH with Hill grade 3 valve, mild gastritis (path: H. pylori positive).  Normal duodenum (path benign)   History of internal hemorrhoids with intermittent scant BRBPR, now incompletely responsive to OTC therapy.   Family history notable for father with colon cancer in his 11s.   Endoscopic history: -Colonoscopy (08/17/2014, Dr. Clearence Cheek at [facility]): Single small polypoid polyp at splenic flexure removed with snare (path: Unknown), Internal hemorrhoids. -Colonoscopy  (03/2020, Dr. Barron Alvine): 2 rectal polyps (1 tubular adenoma), tortuous sigmoid colon, lipoma in the ascending colon, internal hemorrhoids.  Repeat in 7 years  HPI:     Patient is a 67 y.o. femalewith a history of symptomatic internal hemorrhoids presenting to the Gastroenterology Clinic for follow-up and ongoing treatment. The patient presents with symptomatic grade 2 hemorrhoids, unresponsive to maximal medical therapy, requesting rubber band ligation of symptomatic hemorrhoidal disease.  She submitted her stool sample today for [facility] H. pylori testing to evaluate for eradication.  IBS-C improved since starting MiraLAX last week.  No change in medical or surgical history, medications, allergies, social history since last appointment with me.   Review of systems:     No chest pain, no SOB, no fevers, no urinary sx   Past Medical History:  Diagnosis Date   Allergy    Cataract    Complication of anesthesia    Coronary artery disease    Eating disorder    Genital warts    Hyperlipidemia    Mediterranean fever    diagnosed ~ age 42 years in [place], managed on colchicine   Polycythemia vera    PONV (postoperative nausea and vomiting)     Patient's surgical history, family medical history, social history, medications and allergies were all reviewed in Epic    Current Outpatient Medications  Medication Sig Dispense Refill   aspirin 81 MG chewable tablet Chew by mouth daily.     Bismuth 262 MG CHEW Chew 524 mg by mouth in the morning, at noon, in the evening, and at bedtime. 112 tablet 0   colchicine 0.6 MG tablet Take 1 tablet (0.6 mg total) by mouth 2 (two) times daily. 180 tablet 3   Cranberry-Vitamin C 250-60 MG CAPS Take 2 tablets by mouth  daily.     D-MANNOSE PO Take 2 tablets by mouth daily.     diclofenac (VOLTAREN) 75 MG EC tablet Take 1 tablet (75 mg total) by mouth 2 (two) times daily. 20 tablet 0   diphenhydrAMINE (BENADRYL) 25 MG tablet Take 25 mg by mouth at  bedtime.     ezetimibe (ZETIA) 10 MG tablet Take 1 tablet (10 mg total) by mouth daily. Please keep scheduled appointment for additional refills. 90 tablet 3   FIBER PO Take 2 tablets by mouth daily.     linaclotide (LINZESS) 290 MCG CAPS capsule Take 1 capsule (290 mcg total) by mouth daily before breakfast. 90 capsule 5   nitazoxanide (ALINIA) 500 MG tablet Take 1 tablet (500 mg total) by mouth 2 (two) times daily with a meal. 20 tablet 0   nitrofurantoin, macrocrystal-monohydrate, (MACROBID) 100 MG capsule Take 1 capsule (100 mg total) by mouth at bedtime as needed (after intercourse). 90 capsule 3   NON FORMULARY Take 0.5 tablets by mouth at bedtime. CBD oil - for pain Gummies     omeprazole (PRILOSEC) 40 MG capsule Take 1 capsule (40 mg total) by mouth daily. 20 capsule 0   ondansetron (ZOFRAN) 4 MG tablet Take 1 tablet (4 mg total) by mouth every 8 (eight) hours as needed for nausea or vomiting. 30 tablet 1   pantoprazole (PROTONIX) 40 MG tablet TAKE 1 TABLET BY MOUTH EVERY DAY 90 tablet 1   Polyethyl Glycol-Propyl Glycol (SYSTANE OP) Place 1 drop into both eyes in the morning and at bedtime.     PREMARIN vaginal cream PLACE 0.25 APPLICATORFULS VAGINALLY SEE ADMIN INSTRUCTIONS. INSERT 0.5 GRAMS VAGINALLY EVERY OTHER NIGHT 30 g 12   rosuvastatin (CRESTOR) 10 MG tablet Take 1 tablet (10 mg total) by mouth daily. 90 tablet 3   No current facility-administered medications for this visit.    Physical Exam:     BP 118/68   Pulse 98   Ht 5\' 3"  (1.6 m)   Wt 100 lb (45.4 kg)   LMP  (LMP Unknown)   BMI 17.71 kg/m   GENERAL:  Pleasant female in NAD PSYCH: : Cooperative, normal affect NEURO: Alert and oriented x 3, no focal neurologic deficits Rectal exam: Sensation intact and preserved anal wink.  External skin tags.  Grade 2 hemorrhoids noted in all positions on exam.  No external anal fissures noted. Normal sphincter tone. No palpable mass. No blood on the exam glove. (Chaperone: Ailene Rud, CMA).   IMPRESSION and PLAN:    1)  Symptomatic internal hemorrhoids: PROCEDURE NOTE: The patient presents with symptomatic grade 2 hemorrhoids, unresponsive to maximal medical therapy, requesting rubber band ligation of symptomatic hemorrhoidal disease.  All risks, benefits and alternative forms of therapy were described and informed consent was obtained.  In the Left Lateral Decubitus position, anoscopic examination revealed grade 2 hemorrhoids in the all position(s).  The anorectum was pre-medicated with RectiCare. The decision was made to band the RA internal hemorrhoid, and the [facility] was used to perform band ligation without complication.  Digital anorectal examination was then performed to assure proper positioning of the band, and to adjust the banded tissue as required.  The patient was discharged home without pain or other issues.  Dietary and behavioral recommendations were given and along with follow-up instructions.     The following adjunctive treatments were recommended:  -Resume high-fiber diet with fiber supplement (i.e. Citrucel or Benefiber) with goal for soft stools without straining to  have a BM. -Resume adequate fluid intake.  The patient will return in 4 for follow-up and possible additional banding as required. No complications were encountered and the patient tolerated the procedure well.   2) H. pylori gastritis Did not tolerate quadruple therapy x 2, with limited antibiotic exposure.  Patient preferred [facility] panel to evaluate for eradication prior to any further antibiotic administration - Will follow-up on [facility] which was submitted earlier today - If still positive, will likely refer to ID clinic for consideration of alternative treatment options vs referral to Allergy clinic for evaluation and possible desensitization (penicillin allergy)  3) IBS-C - Continue Linzess - Continue MiraLAX 1 cap daily and titrate to soft stools -  Resume probiotic  4) Heartburn 5) GERD - Well-controlled with omeprazole  6) History of colon polyps - Repeat colonoscopy in 2028 for ongoing polyp surveillance    Verlin Dike Kyle Stansell ,DO, FACG 12/23/2022, 12:04 PM

## 2022-12-23 NOTE — Patient Instructions (Signed)
You have been scheduled for an appointment with Dr. Barron Alvine on 02/26/23 at 11:20. Please arrive 10 minutes early for your appointment.   HEMORRHOID BANDING PROCEDURE    FOLLOW-UP CARE   The procedure you have had should have been relatively painless since the banding of the area involved does not have nerve endings and there is no pain sensation.  The rubber band cuts off the blood supply to the hemorrhoid and the band may fall off as soon as 48 hours after the banding (the band may occasionally be seen in the toilet bowl following a bowel movement). You may notice a temporary feeling of fullness in the rectum which should respond adequately to plain Tylenol or Motrin.  Following the banding, avoid strenuous exercise that evening and resume full activity the next day.  A sitz bath (soaking in a warm tub) or bidet is soothing, and can be useful for cleansing the area after bowel movements.     To avoid constipation, take two tablespoons of natural wheat bran, natural oat bran, flax, Benefiber or any over the counter fiber supplement and increase your water intake to 7-8 glasses daily.    Unless you have been prescribed anorectal medication, do not put anything inside your rectum for two weeks: No suppositories, enemas, fingers, etc.  Occasionally, you may have more bleeding than usual after the banding procedure.  This is often from the untreated hemorrhoids rather than the treated one.  Don't be concerned if there is a tablespoon or so of blood.  If there is more blood than this, lie flat with your bottom higher than your head and apply an ice pack to the area. If the bleeding does not stop within a half an hour or if you feel faint, call our office at [phone] or go to the emergency room.  Problems are not common; however, if there is a substantial amount of bleeding, severe pain, chills, fever or difficulty passing urine (very rare) or other problems, you should call us at [phone] or report to the nearest emergency room.  Do not stay seated continuously for more than 2-3 hours for a day or two after the procedure.  Tighten your buttock muscles 10-15 times every two hours and take 10-15 deep breaths every 1-2 hours.  Do not spend more than a few minutes on the toilet if you cannot empty your bowel; instead re-visit the toilet at a later time.     Thank you for choosing me and [facility].  Vito Cirigliano, D.O.

## 2022-12-24 ENCOUNTER — Telehealth: Payer: Self-pay | Admitting: Gastroenterology

## 2022-12-24 DIAGNOSIS — A048 Other specified bacterial intestinal infections: Secondary | ICD-10-CM

## 2022-12-24 NOTE — Telephone Encounter (Signed)
Call from [facility] lab overnight for "positive C difficile".  Office note reviewed, and this patient was being checked for H. Pylori eradication.    When I brought this to the the attention of the lab tech Velna Hatchet), then she was not certain of the result and said she would "check on it".  Vito, please be on the lookout for this result fax since it seems doubtful this patient with constipation is positive for C diff, especially since that was not the requested test.  - H. Danis

## 2022-12-25 ENCOUNTER — Inpatient Hospital Stay (HOSPITAL_BASED_OUTPATIENT_CLINIC_OR_DEPARTMENT_OTHER): Payer: Medicare Other | Admitting: Medical Oncology

## 2022-12-25 ENCOUNTER — Other Ambulatory Visit: Payer: Self-pay

## 2022-12-25 ENCOUNTER — Inpatient Hospital Stay: Payer: Medicare Other

## 2022-12-25 ENCOUNTER — Encounter: Payer: Self-pay | Admitting: Medical Oncology

## 2022-12-25 ENCOUNTER — Inpatient Hospital Stay: Payer: Medicare Other | Attending: Hematology & Oncology

## 2022-12-25 VITALS — BP 119/66 | HR 75 | Temp 98.1°F | Resp 18 | Ht 63.0 in | Wt 102.8 lb

## 2022-12-25 DIAGNOSIS — Z87440 Personal history of urinary (tract) infections: Secondary | ICD-10-CM | POA: Diagnosis not present

## 2022-12-25 DIAGNOSIS — D45 Polycythemia vera: Secondary | ICD-10-CM | POA: Insufficient documentation

## 2022-12-25 DIAGNOSIS — K5900 Constipation, unspecified: Secondary | ICD-10-CM | POA: Insufficient documentation

## 2022-12-25 DIAGNOSIS — B9689 Other specified bacterial agents as the cause of diseases classified elsewhere: Secondary | ICD-10-CM | POA: Insufficient documentation

## 2022-12-25 DIAGNOSIS — D509 Iron deficiency anemia, unspecified: Secondary | ICD-10-CM

## 2022-12-25 LAB — CMP (CANCER CENTER ONLY)
ALT: 11 U/L (ref 0–44)
AST: 17 U/L (ref 15–41)
Albumin: 4.3 g/dL (ref 3.5–5.0)
Alkaline Phosphatase: 66 U/L (ref 38–126)
Anion gap: 7 (ref 5–15)
BUN: 16 mg/dL (ref 8–23)
CO2: 28 mmol/L (ref 22–32)
Calcium: 9.4 mg/dL (ref 8.9–10.3)
Chloride: 103 mmol/L (ref 98–111)
Creatinine: 0.87 mg/dL (ref 0.44–1.00)
GFR, Estimated: >60 mL/min (ref >60)
Glucose, Bld: 86 mg/dL (ref 70–99)
Potassium: 4.7 mmol/L (ref 3.5–5.1)
Sodium: 138 mmol/L (ref 135–145)
Total Bilirubin: 0.5 mg/dL (ref 0.3–1.2)
Total Protein: 7.5 g/dL (ref 6.5–8.1)

## 2022-12-25 LAB — CBC WITH DIFFERENTIAL (CANCER CENTER ONLY)
Abs Immature Granulocytes: 0.09 10*3/uL — ABNORMAL HIGH (ref 0.00–0.07)
Basophils Absolute: 0.1 10*3/uL (ref 0.0–0.1)
Basophils Relative: 1 %
Eosinophils Absolute: 0.3 10*3/uL (ref 0.0–0.5)
Eosinophils Relative: 2 %
HCT: 38.1 % (ref 36.0–46.0)
Hemoglobin: 10.9 g/dL — ABNORMAL LOW (ref 12.0–15.0)
Immature Granulocytes: 1 %
Lymphocytes Relative: 15 %
Lymphs Abs: 2.1 10*3/uL (ref 0.7–4.0)
MCH: 19.1 pg — ABNORMAL LOW (ref 26.0–34.0)
MCHC: 28.6 g/dL — ABNORMAL LOW (ref 30.0–36.0)
MCV: 66.7 fL — ABNORMAL LOW (ref 80.0–100.0)
Monocytes Absolute: 0.7 10*3/uL (ref 0.1–1.0)
Monocytes Relative: 5 %
Neutro Abs: 10.4 10*3/uL — ABNORMAL HIGH (ref 1.7–7.7)
Neutrophils Relative %: 76 %
Platelet Count: 516 10*3/uL — ABNORMAL HIGH (ref 150–400)
RBC: 5.71 MIL/uL — ABNORMAL HIGH (ref 3.87–5.11)
RDW: 20.8 % — ABNORMAL HIGH (ref 11.5–15.5)
WBC Count: 13.7 10*3/uL — ABNORMAL HIGH (ref 4.0–10.5)
nRBC: 0 % (ref 0.0–0.2)

## 2022-12-25 LAB — FERRITIN: Ferritin: 5 ng/mL — ABNORMAL LOW (ref 11–307)

## 2022-12-25 LAB — IRON AND IRON BINDING CAPACITY (CC-WL,HP ONLY)
Iron: 25 ug/dL — ABNORMAL LOW (ref 28–170)
Saturation Ratios: 5 % — ABNORMAL LOW (ref 10.4–31.8)
TIBC: 469 ug/dL — ABNORMAL HIGH (ref 250–450)
UIBC: 444 ug/dL — ABNORMAL HIGH (ref 148–442)

## 2022-12-25 NOTE — Telephone Encounter (Signed)
Spoke with patient. Made her aware of her result. Informed her Dr. Barron Alvine recommended referral to ID clinic for assistance managing H Pylori. Patient verbalized understanding and no other concerns at the end of call.  Referral is sent. [facility] [address] [place] Phone: [phone]

## 2022-12-25 NOTE — Progress Notes (Addendum)
Hematology and Oncology Follow Up Visit  Katrina Walters [id number] [birth year] 67 y.o. 12/25/2022   Principle Diagnosis:   Polycythemia Vera -- JAK2 (+)    Current Therapy:        Phlebotomy to maintain HCT below 45% Aspirin 81 mg p.o. daily   Interim History:  Katrina Walters is here today for follow-up. She does have polycythemia vera- JAK2 positive. She has been phlebotomized- at her last visit she became dizzy after but improved with supportive fluids.   Since our last appointment she states that she has been seeing GI recently for new constipation and gastric ulcers from H. Pylori infections. Trying to follow their treatment plan but it does cause some GI upset. She is has had an endoscopy and colonoscopy but has not had a capsule study.   She also reports frequent UTIS.  She also has been seen by urology in the past for frequent UTIs. She continues to work with them to fix this problem. She wonders if this is, along with the H. Pylori infection, is why her WBC is a bit elevated.   She does report headaches in the middle of the night. She does snore. She is having some daytime fatigue. She denies skin lesions, increased fatigue.   Overall, I would say that her performance status is probably ECOG 1. Wt Readings from Last 3 Encounters:  12/25/22 102 lb 12.8 oz (46.6 kg)  12/23/22 100 lb (45.4 kg)  12/15/22 101 lb (45.8 kg)     Medications:  Allergies as of 12/25/2022       Reactions   Amoxicillin    Had fever with flu like symptoms. Has patient had a PCN reaction causing immediate rash, facial/tongue/throat swelling, SOB or lightheadedness with hypotension: no Has patient had a PCN reaction causing severe rash involving mucus membranes or skin necrosis: no Has patient had a PCN reaction that required hospitalization no Has patient had a PCN reaction occurring within the last 10 years: yes If all of the above answers are "NO", then may proceed with Cephalosporin use.   Clopidogrel &  Aspirin Itching   Amlodipine Nausea Only   Dizzy, heavy feeling, tongue burning   Atorvastatin Other (See Comments)   Myalgia   Crestor [rosuvastatin] Itching   Metoprolol Other (See Comments)   hair loss   Pravastatin Itching   Repatha [evolocumab] Itching        Medication List        Accurate as of December 25, 2022  1:17 PM. If you have any questions, ask your nurse or doctor.          aspirin 81 MG chewable tablet Chew by mouth daily.   Bismuth 262 MG Chew Chew 524 mg by mouth in the morning, at noon, in the evening, and at bedtime.   colchicine 0.6 MG tablet Take 1 tablet (0.6 mg total) by mouth 2 (two) times daily.   Cranberry-Vitamin C 250-60 MG Caps Take 2 tablets by mouth daily.   D-MANNOSE PO Take 2 tablets by mouth daily.   diclofenac 75 MG EC tablet Commonly known as: VOLTAREN Take 1 tablet (75 mg total) by mouth 2 (two) times daily.   diphenhydrAMINE 25 MG tablet Commonly known as: BENADRYL Take 25 mg by mouth at bedtime.   ezetimibe 10 MG tablet Commonly known as: ZETIA Take 1 tablet (10 mg total) by mouth daily. Please keep scheduled appointment for additional refills.   FIBER PO Take 2 tablets by mouth daily.   linaclotide  290 MCG Caps capsule Commonly known as: LINZESS Take 1 capsule (290 mcg total) by mouth daily before breakfast.   nitazoxanide 500 MG tablet Commonly known as: ALINIA Take 1 tablet (500 mg total) by mouth 2 (two) times daily with a meal.   nitrofurantoin (macrocrystal-monohydrate) 100 MG capsule Commonly known as: MACROBID Take 1 capsule (100 mg total) by mouth at bedtime as needed (after intercourse).   NON FORMULARY Take 0.5 tablets by mouth at bedtime. CBD oil - for pain Gummies   omeprazole 40 MG capsule Commonly known as: PRILOSEC Take 1 capsule (40 mg total) by mouth daily.   ondansetron 4 MG tablet Commonly known as: ZOFRAN Take 1 tablet (4 mg total) by mouth every 8 (eight) hours as needed for nausea  or vomiting.   pantoprazole 40 MG tablet Commonly known as: PROTONIX TAKE 1 TABLET BY MOUTH EVERY DAY   Premarin vaginal cream Generic drug: conjugated estrogens PLACE 0.25 APPLICATORFULS VAGINALLY SEE ADMIN INSTRUCTIONS. INSERT 0.5 GRAMS VAGINALLY EVERY OTHER NIGHT   rosuvastatin 10 MG tablet Commonly known as: CRESTOR Take 1 tablet (10 mg total) by mouth daily.   SYSTANE OP Place 1 drop into both eyes in the morning and at bedtime.        Allergies:  Allergies  Allergen Reactions   Amoxicillin     Had fever with flu like symptoms. Has patient had a PCN reaction causing immediate rash, facial/tongue/throat swelling, SOB or lightheadedness with hypotension: no Has patient had a PCN reaction causing severe rash involving mucus membranes or skin necrosis: no Has patient had a PCN reaction that required hospitalization no Has patient had a PCN reaction occurring within the last 10 years: yes If all of the above answers are "NO", then may proceed with Cephalosporin use.   Clopidogrel & Aspirin Itching   Amlodipine Nausea Only    Dizzy, heavy feeling, tongue burning   Atorvastatin Other (See Comments)    Myalgia   Crestor [Rosuvastatin] Itching   Metoprolol Other (See Comments)    hair loss   Pravastatin Itching   Repatha [Evolocumab] Itching    Past Medical History, Surgical history, Social history, and Family History were reviewed and updated.  Review of Systems: Review of Systems  Constitutional:  Negative for diaphoresis and fever.  HENT: Negative.    Eyes: Negative.   Respiratory: Negative.    Cardiovascular: Negative.   Gastrointestinal:  Positive for constipation and heartburn.  Genitourinary: Negative.   Musculoskeletal: Negative.   Skin: Negative.   Neurological: Negative.   Endo/Heme/Allergies: Negative.   Psychiatric/Behavioral: Negative.       Physical Exam: Vitals:   12/25/22 1305  BP: 119/66  Pulse: 75  Resp: 18  Temp: 98.1 F (36.7 C)   SpO2: 100%     Wt Readings from Last 3 Encounters:  12/25/22 102 lb 12.8 oz (46.6 kg)  12/23/22 100 lb (45.4 kg)  12/15/22 101 lb (45.8 kg)    Physical Exam Vitals reviewed.  HENT:     Head: Normocephalic and atraumatic.  Eyes:     Pupils: Pupils are equal, round, and reactive to light.  Cardiovascular:     Rate and Rhythm: Normal rate and regular rhythm.     Heart sounds: Normal heart sounds.  Pulmonary:     Effort: Pulmonary effort is normal.     Breath sounds: Normal breath sounds.  Musculoskeletal:        General: No tenderness or deformity. Normal range of motion.     Cervical  back: Normal range of motion.  Lymphadenopathy:     Cervical: No cervical adenopathy.  Skin:    General: Skin is warm and dry.     Findings: No erythema or rash.  Neurological:     Mental Status: She is alert and oriented to person, place, and time.  Psychiatric:        Behavior: Behavior normal.        Thought Content: Thought content normal.        Judgment: Judgment normal.      Lab Results  Component Value Date   WBC 13.7 (H) 12/25/2022   HGB 10.9 (L) 12/25/2022   HCT 38.1 12/25/2022   MCV 66.7 (L) 12/25/2022   PLT 516 (H) 12/25/2022   Lab Results  Component Value Date   FERRITIN 7 (L) 11/24/2022   IRON 28 11/24/2022   TIBC 482 (H) 11/24/2022   UIBC 454 (H) 11/24/2022   IRONPCTSAT 6 (L) 11/24/2022   Lab Results  Component Value Date   RETICCTPCT 1.5 11/24/2022   RBC 5.71 (H) 12/25/2022   No results found for: "KPAFRELGTCHN", "LAMBDASER", "KAPLAMBRATIO" No results found for: "IGGSERUM", "IGA", "IGMSERUM" No results found for: "TOTALPROTELP", "ALBUMINELP", "A1GS", "A2GS", "BETS", "BETA2SER", "GAMS", "MSPIKE", "SPEI"   Chemistry      Component Value Date/Time   NA 138 12/25/2022 1239   NA 139 04/15/2021 1027   K 4.7 12/25/2022 1239   CL 103 12/25/2022 1239   CO2 28 12/25/2022 1239   BUN 16 12/25/2022 1239   BUN 14 04/15/2021 1027   CREATININE 0.87 12/25/2022 1239    CREATININE 0.84 07/14/2016 0914      Component Value Date/Time   CALCIUM 9.4 12/25/2022 1239   ALKPHOS 66 12/25/2022 1239   AST 17 12/25/2022 1239   ALT 11 12/25/2022 1239   BILITOT 0.5 12/25/2022 1239      Encounter Diagnosis  Name Primary?   Polycythemia vera Yes    Impression and Plan: Katrina Walters is a very pleasant 67 yo caucasian female with polycythemia vera.  She is JAK2 positive.  She has been phlebotomized.   No phlebotomy needed today given blood counts She will talk with PCP about sleep study, referral to urogynecology and will contineu follow up with GI. I suspect her changes are secondary to some slow chronic ulcerative bleeding. I also thing she is correct that the H. Pylori infection and chronic UTIs likely play a role in her mild leukocytosis. She will continue PCP follow up as well.  Elevated platelets likely secondary to IDA.  If her trends worsen or fail to improve after resolution of her chronic UTIs/H.Pylori infection I would suggest a bone marrow biopsy. Will have her seen by Dr. Rexene Edison for follow up to ensure he is agreeable with our plan following recheck of labs.   RTC 1 month MD only, labs(CBC w/, CMP, LDH, Retic, Iron, ferritin) +- Phlebotomy with IVF replacement    Rushie Chestnut, PA-C 4/18/20241:17 PM

## 2022-12-25 NOTE — Addendum Note (Signed)
Addended by: Coletta Memos on: 12/25/2022 04:39 PM   Modules accepted: Orders

## 2022-12-30 ENCOUNTER — Inpatient Hospital Stay: Payer: Medicare Other

## 2022-12-30 VITALS — BP 120/86 | HR 84 | Resp 18

## 2022-12-30 DIAGNOSIS — D649 Anemia, unspecified: Secondary | ICD-10-CM

## 2022-12-30 DIAGNOSIS — D509 Iron deficiency anemia, unspecified: Secondary | ICD-10-CM

## 2022-12-30 DIAGNOSIS — Z87440 Personal history of urinary (tract) infections: Secondary | ICD-10-CM | POA: Diagnosis not present

## 2022-12-30 DIAGNOSIS — D45 Polycythemia vera: Secondary | ICD-10-CM | POA: Diagnosis not present

## 2022-12-30 DIAGNOSIS — K5900 Constipation, unspecified: Secondary | ICD-10-CM | POA: Diagnosis not present

## 2022-12-30 DIAGNOSIS — B9689 Other specified bacterial agents as the cause of diseases classified elsewhere: Secondary | ICD-10-CM | POA: Diagnosis not present

## 2022-12-30 MED ORDER — METHYLPREDNISOLONE SODIUM SUCC 125 MG IJ SOLR
60.0000 mg | Freq: Once | INTRAMUSCULAR | Status: AC
  Administered 2022-12-30: 60 mg via INTRAVENOUS
  Filled 2022-12-30: qty 2

## 2022-12-30 MED ORDER — SODIUM CHLORIDE 0.9 % IV SOLN: Freq: Once | INTRAVENOUS | Status: AC

## 2022-12-30 MED ORDER — FAMOTIDINE IN NACL 20-0.9 MG/50ML-% IV SOLN
20.0000 mg | INTRAVENOUS | Status: AC
  Administered 2022-12-30: 20 mg via INTRAVENOUS
  Filled 2022-12-30: qty 50

## 2022-12-30 MED ORDER — SODIUM CHLORIDE 0.9 % IV SOLN
300.0000 mg | Freq: Once | INTRAVENOUS | Status: AC
  Administered 2022-12-30: 300 mg via INTRAVENOUS
  Filled 2022-12-30: qty 300

## 2022-12-30 NOTE — Patient Instructions (Signed)

## 2023-01-06 ENCOUNTER — Encounter (HOSPITAL_BASED_OUTPATIENT_CLINIC_OR_DEPARTMENT_OTHER): Payer: Self-pay

## 2023-01-06 ENCOUNTER — Inpatient Hospital Stay: Payer: Medicare Other

## 2023-01-06 ENCOUNTER — Encounter: Payer: Self-pay | Admitting: Gastroenterology

## 2023-01-06 ENCOUNTER — Other Ambulatory Visit (HOSPITAL_BASED_OUTPATIENT_CLINIC_OR_DEPARTMENT_OTHER): Payer: Self-pay | Admitting: Medical

## 2023-01-06 ENCOUNTER — Ambulatory Visit (HOSPITAL_BASED_OUTPATIENT_CLINIC_OR_DEPARTMENT_OTHER)
Admission: RE | Admit: 2023-01-06 | Discharge: 2023-01-06 | Disposition: A | Discharge status: Home / Self Care | Payer: Medicare Other | Source: Ambulatory Visit | Attending: Medical | Admitting: Medical

## 2023-01-06 VITALS — BP 104/56 | HR 73 | Temp 98.2°F | Resp 16

## 2023-01-06 DIAGNOSIS — Z1231 Encounter for screening mammogram for malignant neoplasm of breast: Secondary | ICD-10-CM

## 2023-01-06 DIAGNOSIS — Z87440 Personal history of urinary (tract) infections: Secondary | ICD-10-CM | POA: Diagnosis not present

## 2023-01-06 DIAGNOSIS — B9689 Other specified bacterial agents as the cause of diseases classified elsewhere: Secondary | ICD-10-CM | POA: Diagnosis not present

## 2023-01-06 DIAGNOSIS — D649 Anemia, unspecified: Secondary | ICD-10-CM

## 2023-01-06 DIAGNOSIS — D509 Iron deficiency anemia, unspecified: Secondary | ICD-10-CM

## 2023-01-06 DIAGNOSIS — K5900 Constipation, unspecified: Secondary | ICD-10-CM | POA: Diagnosis not present

## 2023-01-06 DIAGNOSIS — D45 Polycythemia vera: Secondary | ICD-10-CM | POA: Diagnosis not present

## 2023-01-06 MED ORDER — METHYLPREDNISOLONE SODIUM SUCC 125 MG IJ SOLR
60.0000 mg | Freq: Once | INTRAMUSCULAR | Status: AC
  Administered 2023-01-06: 60 mg via INTRAVENOUS
  Filled 2023-01-06: qty 2

## 2023-01-06 MED ORDER — SODIUM CHLORIDE 0.9 % IV SOLN
40.0000 mg | Freq: Once | INTRAVENOUS | Status: DC
  Filled 2023-01-06: qty 4

## 2023-01-06 MED ORDER — SODIUM CHLORIDE 0.9 % IV SOLN: Freq: Once | INTRAVENOUS | Status: AC

## 2023-01-06 MED ORDER — FAMOTIDINE IN NACL 20-0.9 MG/50ML-% IV SOLN
20.0000 mg | Freq: Once | INTRAVENOUS | Status: AC
  Administered 2023-01-06: 20 mg via INTRAVENOUS
  Filled 2023-01-06: qty 50

## 2023-01-06 MED ORDER — SODIUM CHLORIDE 0.9 % IV SOLN
300.0000 mg | Freq: Once | INTRAVENOUS | Status: AC
  Administered 2023-01-06: 300 mg via INTRAVENOUS
  Filled 2023-01-06: qty 300

## 2023-01-06 NOTE — Patient Instructions (Signed)

## 2023-01-06 NOTE — Progress Notes (Signed)
Patient refused to wait 30 mintes post infusion. Released stable and ASX.

## 2023-01-12 ENCOUNTER — Other Ambulatory Visit: Payer: Self-pay | Admitting: Medical

## 2023-01-13 ENCOUNTER — Inpatient Hospital Stay: Payer: Medicare Other | Attending: Hematology & Oncology

## 2023-01-13 VITALS — BP 112/66 | HR 70 | Temp 98.0°F | Resp 16

## 2023-01-13 DIAGNOSIS — D45 Polycythemia vera: Secondary | ICD-10-CM | POA: Diagnosis not present

## 2023-01-13 DIAGNOSIS — D509 Iron deficiency anemia, unspecified: Secondary | ICD-10-CM | POA: Insufficient documentation

## 2023-01-13 DIAGNOSIS — D649 Anemia, unspecified: Secondary | ICD-10-CM

## 2023-01-13 MED ORDER — SODIUM CHLORIDE 0.9 % IV SOLN
300.0000 mg | Freq: Once | INTRAVENOUS | Status: AC
  Administered 2023-01-13: 300 mg via INTRAVENOUS
  Filled 2023-01-13: qty 300

## 2023-01-13 MED ORDER — SODIUM CHLORIDE 0.9 % IV SOLN
40.0000 mg | Freq: Once | INTRAVENOUS | Status: AC
  Administered 2023-01-13: 40 mg via INTRAVENOUS
  Filled 2023-01-13: qty 4

## 2023-01-13 MED ORDER — METHYLPREDNISOLONE SODIUM SUCC 125 MG IJ SOLR: 60.0000 mg | Freq: Once | INTRAMUSCULAR | Status: DC

## 2023-01-13 MED ORDER — SODIUM CHLORIDE 0.9 % IV SOLN: Freq: Once | INTRAVENOUS | Status: AC

## 2023-01-13 NOTE — Patient Instructions (Signed)

## 2023-01-13 NOTE — Progress Notes (Signed)
Patient refused to wait 30 minutes post infusion. Released stable and ASX. 

## 2023-01-23 ENCOUNTER — Inpatient Hospital Stay: Payer: Medicare Other

## 2023-01-23 ENCOUNTER — Inpatient Hospital Stay (HOSPITAL_BASED_OUTPATIENT_CLINIC_OR_DEPARTMENT_OTHER): Payer: Medicare Other | Admitting: Family

## 2023-01-23 ENCOUNTER — Encounter: Payer: Self-pay | Admitting: Family

## 2023-01-23 ENCOUNTER — Other Ambulatory Visit: Payer: Self-pay | Admitting: Family

## 2023-01-23 VITALS — BP 120/67 | HR 81 | Temp 98.2°F | Resp 17 | Wt 100.9 lb

## 2023-01-23 DIAGNOSIS — D45 Polycythemia vera: Secondary | ICD-10-CM

## 2023-01-23 DIAGNOSIS — D509 Iron deficiency anemia, unspecified: Secondary | ICD-10-CM

## 2023-01-23 LAB — CMP (CANCER CENTER ONLY)
ALT: 20 U/L (ref 0–44)
AST: 29 U/L (ref 15–41)
Albumin: 4.8 g/dL (ref 3.5–5.0)
Alkaline Phosphatase: 73 U/L (ref 38–126)
Anion gap: 8 (ref 5–15)
BUN: 12 mg/dL (ref 8–23)
CO2: 29 mmol/L (ref 22–32)
Calcium: 10.4 mg/dL — ABNORMAL HIGH (ref 8.9–10.3)
Chloride: 99 mmol/L (ref 98–111)
Creatinine: 0.78 mg/dL (ref 0.44–1.00)
GFR, Estimated: >60 mL/min (ref >60)
Glucose, Bld: 89 mg/dL (ref 70–99)
Potassium: 4.6 mmol/L (ref 3.5–5.1)
Sodium: 136 mmol/L (ref 135–145)
Total Bilirubin: 0.6 mg/dL (ref 0.3–1.2)
Total Protein: 7.9 g/dL (ref 6.5–8.1)

## 2023-01-23 LAB — IRON AND IRON BINDING CAPACITY (CC-WL,HP ONLY)
Iron: 66 ug/dL (ref 28–170)
Saturation Ratios: 19 % (ref 10.4–31.8)
TIBC: 354 ug/dL (ref 250–450)
UIBC: 288 ug/dL (ref 148–442)

## 2023-01-23 LAB — CBC WITH DIFFERENTIAL (CANCER CENTER ONLY)
Abs Immature Granulocytes: 0.09 10*3/uL — ABNORMAL HIGH (ref 0.00–0.07)
Basophils Absolute: 0.1 10*3/uL (ref 0.0–0.1)
Basophils Relative: 1 %
Eosinophils Absolute: 0.3 10*3/uL (ref 0.0–0.5)
Eosinophils Relative: 2 %
HCT: 48.9 % — ABNORMAL HIGH (ref 36.0–46.0)
Hemoglobin: 14 g/dL (ref 12.0–15.0)
Immature Granulocytes: 1 %
Lymphocytes Relative: 16 %
Lymphs Abs: 1.8 10*3/uL (ref 0.7–4.0)
MCH: 21.1 pg — ABNORMAL LOW (ref 26.0–34.0)
MCHC: 28.6 g/dL — ABNORMAL LOW (ref 30.0–36.0)
MCV: 73.9 fL — ABNORMAL LOW (ref 80.0–100.0)
Monocytes Absolute: 0.6 10*3/uL (ref 0.1–1.0)
Monocytes Relative: 5 %
Neutro Abs: 8.9 10*3/uL — ABNORMAL HIGH (ref 1.7–7.7)
Neutrophils Relative %: 75 %
Platelet Count: 558 10*3/uL — ABNORMAL HIGH (ref 150–400)
RBC: 6.62 MIL/uL — ABNORMAL HIGH (ref 3.87–5.11)
RDW: 32 % — ABNORMAL HIGH (ref 11.5–15.5)
WBC Count: 11.8 10*3/uL — ABNORMAL HIGH (ref 4.0–10.5)
nRBC: 0 % (ref 0.0–0.2)

## 2023-01-23 LAB — FERRITIN: Ferritin: 156 ng/mL (ref 11–307)

## 2023-01-23 NOTE — Patient Instructions (Signed)

## 2023-01-23 NOTE — Progress Notes (Signed)
Katrina Walters presents today for phlebotomy per MD orders. Phlebotomy procedure started at 11:06 AM and ended at 11:20 AM. 518 grams removed via 18 gauge needle to left AC.  Patient observation for 30 minutes after procedure.  Patient tolerated procedure well and received 500 mL of replacement fluids after procedure.  Patient understands to call if he has any questions or concerns post discharge.

## 2023-01-23 NOTE — Progress Notes (Signed)
Hematology and Oncology Follow Up Visit  Katrina Walters [id number] [birth year] 67 y.o. 01/23/2023   Principle Diagnosis:  Polycythemia Vera -- JAK2 (+)   Current Therapy:        Phlebotomy to maintain HCT below 45% Aspirin 81 mg PO daily   Interim History:  Katrina Walters is here today for follow-up. She is doing well. She did note hot flashes, night sweats and itching after a hot shower after receiving IV iron.  Hct today is 48.9%.  She has occasional dizziness when standing too quickly from a seated position and bending over.  No fever, chills, n/v, cough, rash, SOB, chest pain, palpitations, abdominal pain or changes in bowel or bladder habits.  No swelling or tenderness in her extremities.  She has tingling in her 4th and 5th toes of the right foot.  No falls or syncope reported.  Appetite and hydration are good. Weight is stable at 100 lbs.   ECOG Performance Status: 1 - Symptomatic but completely ambulatory  Medications:  Allergies as of 01/23/2023       Reactions   Amoxicillin    Had fever with flu like symptoms. Has patient had a PCN reaction causing immediate rash, facial/tongue/throat swelling, SOB or lightheadedness with hypotension: no Has patient had a PCN reaction causing severe rash involving mucus membranes or skin necrosis: no Has patient had a PCN reaction that required hospitalization no Has patient had a PCN reaction occurring within the last 10 years: yes If all of the above answers are "NO", then may proceed with Cephalosporin use.   Clopidogrel & Aspirin Itching   Amlodipine Nausea Only   Dizzy, heavy feeling, tongue burning   Atorvastatin Other (See Comments)   Myalgia   Crestor [rosuvastatin] Itching   Metoprolol Other (See Comments)   hair loss   Pravastatin Itching   Repatha [evolocumab] Itching        Medication List        Accurate as of Jan 23, 2023 10:09 AM. If you have any questions, ask your nurse or doctor.          aspirin 81 MG  chewable tablet Chew by mouth daily.   Bismuth 262 MG Chew Chew 524 mg by mouth in the morning, at noon, in the evening, and at bedtime.   colchicine 0.6 MG tablet Take 1 tablet (0.6 mg total) by mouth 2 (two) times daily.   Cranberry-Vitamin C 250-60 MG Caps Take 2 tablets by mouth daily.   D-MANNOSE PO Take 2 tablets by mouth daily.   diclofenac 75 MG EC tablet Commonly known as: VOLTAREN Take 1 tablet (75 mg total) by mouth 2 (two) times daily.   diphenhydrAMINE 25 MG tablet Commonly known as: BENADRYL Take 25 mg by mouth at bedtime.   ezetimibe 10 MG tablet Commonly known as: ZETIA Take 1 tablet (10 mg total) by mouth daily. Please keep scheduled appointment for additional refills.   FIBER PO Take 2 tablets by mouth daily.   linaclotide 290 MCG Caps capsule Commonly known as: LINZESS Take 1 capsule (290 mcg total) by mouth daily before breakfast.   nitazoxanide 500 MG tablet Commonly known as: ALINIA Take 1 tablet (500 mg total) by mouth 2 (two) times daily with a meal.   nitrofurantoin (macrocrystal-monohydrate) 100 MG capsule Commonly known as: MACROBID Take 1 capsule (100 mg total) by mouth at bedtime as needed (after intercourse).   NON FORMULARY Take 0.5 tablets by mouth at bedtime. CBD oil - for pain Gummies  omeprazole 40 MG capsule Commonly known as: PRILOSEC Take 1 capsule (40 mg total) by mouth daily.   ondansetron 4 MG tablet Commonly known as: ZOFRAN Take 1 tablet (4 mg total) by mouth every 8 (eight) hours as needed for nausea or vomiting.   pantoprazole 40 MG tablet Commonly known as: PROTONIX TAKE 1 TABLET BY MOUTH EVERY DAY   Premarin vaginal cream Generic drug: conjugated estrogens PLACE 0.25 APPLICATORFULS VAGINALLY SEE ADMIN INSTRUCTIONS. INSERT 0.5 GRAMS VAGINALLY EVERY OTHER NIGHT   Prolia 60 MG/ML Sosy injection Generic drug: denosumab Inject 60 mg into the skin every 6 (six) months. Pt due 02/06/23   rosuvastatin 10 MG  tablet Commonly known as: CRESTOR Take 1 tablet (10 mg total) by mouth daily.   SYSTANE OP Place 1 drop into both eyes in the morning and at bedtime.        Allergies:  Allergies  Allergen Reactions   Amoxicillin     Had fever with flu like symptoms. Has patient had a PCN reaction causing immediate rash, facial/tongue/throat swelling, SOB or lightheadedness with hypotension: no Has patient had a PCN reaction causing severe rash involving mucus membranes or skin necrosis: no Has patient had a PCN reaction that required hospitalization no Has patient had a PCN reaction occurring within the last 10 years: yes If all of the above answers are "NO", then may proceed with Cephalosporin use.   Clopidogrel & Aspirin Itching   Amlodipine Nausea Only    Dizzy, heavy feeling, tongue burning   Atorvastatin Other (See Comments)    Myalgia   Crestor [Rosuvastatin] Itching   Metoprolol Other (See Comments)    hair loss   Pravastatin Itching   Repatha [Evolocumab] Itching    Past Medical History, Surgical history, Social history, and Family History were reviewed and updated.  Review of Systems: All other 10 point review of systems is negative.   Physical Exam:  vitals were not taken for this visit.   Wt Readings from Last 3 Encounters:  12/25/22 102 lb 12.8 oz (46.6 kg)  12/23/22 100 lb (45.4 kg)  12/15/22 101 lb (45.8 kg)    Ocular: Sclerae unicteric, pupils equal, round and reactive to light Ear-nose-throat: Oropharynx clear, dentition fair Lymphatic: No cervical or supraclavicular adenopathy Lungs no rales or rhonchi, good excursion bilaterally Heart regular rate and rhythm, no murmur appreciated Abd soft, nontender, positive bowel sounds MSK no focal spinal tenderness, no joint edema Neuro: non-focal, well-oriented, appropriate affect Breasts: Deferred   Lab Results  Component Value Date   WBC 13.7 (H) 12/25/2022   HGB 10.9 (L) 12/25/2022   HCT 38.1 12/25/2022   MCV  66.7 (L) 12/25/2022   PLT 516 (H) 12/25/2022   Lab Results  Component Value Date   FERRITIN 5 (L) 12/25/2022   IRON 25 (L) 12/25/2022   TIBC 469 (H) 12/25/2022   UIBC 444 (H) 12/25/2022   IRONPCTSAT 5 (L) 12/25/2022   Lab Results  Component Value Date   RETICCTPCT 1.5 11/24/2022   RBC 5.71 (H) 12/25/2022   No results found for: "KPAFRELGTCHN", "LAMBDASER", "KAPLAMBRATIO" No results found for: "IGGSERUM", "IGA", "IGMSERUM" No results found for: "TOTALPROTELP", "ALBUMINELP", "A1GS", "A2GS", "BETS", "BETA2SER", "GAMS", "MSPIKE", "SPEI"   Chemistry      Component Value Date/Time   NA 138 12/25/2022 1239   NA 139 04/15/2021 1027   K 4.7 12/25/2022 1239   CL 103 12/25/2022 1239   CO2 28 12/25/2022 1239   BUN 16 12/25/2022 1239   BUN 14  04/15/2021 1027   CREATININE 0.87 12/25/2022 1239   CREATININE 0.84 07/14/2016 0914      Component Value Date/Time   CALCIUM 9.4 12/25/2022 1239   ALKPHOS 66 12/25/2022 1239   AST 17 12/25/2022 1239   ALT 11 12/25/2022 1239   BILITOT 0.5 12/25/2022 1239       Impression and Plan: Katrina Walters is a very pleasant 67 yo caucasian female with polycythemia vera, JAK 2 positive.  We will proceed with phlebotomy today for Hct 48.9%.  Lab and phlebotomy in 4 weeks, follow-up in 2 months.   Katrina Stanford, Katrina Walters 5/17/202410:09 AM

## 2023-01-26 ENCOUNTER — Telehealth: Payer: Medicare Other | Admitting: Medical

## 2023-01-26 ENCOUNTER — Other Ambulatory Visit: Payer: Self-pay

## 2023-01-26 MED ORDER — DICLOFENAC SODIUM 75 MG PO TBEC: 75.0000 mg | DELAYED_RELEASE_TABLET | Freq: Two times a day (BID) | ORAL | 0 refills | Status: DC

## 2023-01-27 ENCOUNTER — Telehealth: Payer: Self-pay

## 2023-01-27 NOTE — Telephone Encounter (Signed)
Received call from [facility]- they wanted to confirm delivery address and date. Prolia will be here on 5/29.

## 2023-02-04 ENCOUNTER — Telehealth: Payer: Self-pay | Admitting: Medical

## 2023-02-04 NOTE — Telephone Encounter (Signed)
Katrina Walters with [facility] called to confirm address for pt's prolia delivery. [employer] had our address as [address] so it was unable to be delivered. Confirmed address with her so she could call and notify [employer]. Delivery will be attempted again tomorrow.

## 2023-02-04 NOTE — Telephone Encounter (Signed)
Spoke with French Ana at [employer] and she stated that the address has been updated and they will try to redeliver tomorrow.

## 2023-02-10 NOTE — Telephone Encounter (Signed)
[facility]ed [facility] again because we did not get prolia.  Spoke with Rosey Bath and she corrected address again (3rd time) and that we should receive tomorrow.

## 2023-02-11 ENCOUNTER — Ambulatory Visit (INDEPENDENT_AMBULATORY_CARE_PROVIDER_SITE_OTHER): Payer: Medicare Other

## 2023-02-11 DIAGNOSIS — M810 Age-related osteoporosis without current pathological fracture: Secondary | ICD-10-CM

## 2023-02-11 MED ORDER — DENOSUMAB 60 MG/ML ~~LOC~~ SOSY
60.0000 mg | PREFILLED_SYRINGE | Freq: Once | SUBCUTANEOUS | Status: AC
Start: 2023-02-11 — End: 2023-02-11
  Administered 2023-02-11: 60 mg via SUBCUTANEOUS

## 2023-02-11 NOTE — Progress Notes (Signed)
Pt here today for Prolia injection per Ramon Dredge.  Prolia 60mg /mL injected into L arm SUBQ. Pt supplied injection from pharmacy. Pt tolerated injection well.   Next in 6 months.

## 2023-02-12 ENCOUNTER — Other Ambulatory Visit (HOSPITAL_COMMUNITY): Payer: Self-pay

## 2023-02-12 ENCOUNTER — Telehealth: Payer: Self-pay

## 2023-02-12 NOTE — Telephone Encounter (Signed)
Pharmacy Patient Advocate Encounter   Received notification from [facility] that prior authorization for Prolia is required/requested.   Prior Authorization for Katrina Walters has been approved with [facility].    PA# [id number] Effective dates: 02/12/23 through 02/12/24

## 2023-02-13 ENCOUNTER — Telehealth: Payer: Self-pay | Admitting: *Deleted

## 2023-02-13 NOTE — Telephone Encounter (Signed)
Pt received Prolia injection on 02/11/23.

## 2023-02-20 ENCOUNTER — Inpatient Hospital Stay: Payer: Medicare Other

## 2023-02-20 ENCOUNTER — Inpatient Hospital Stay: Payer: Medicare Other | Attending: Hematology & Oncology

## 2023-02-20 VITALS — BP 108/61 | HR 76 | Temp 98.0°F | Resp 17

## 2023-02-20 DIAGNOSIS — D45 Polycythemia vera: Secondary | ICD-10-CM | POA: Insufficient documentation

## 2023-02-20 DIAGNOSIS — D509 Iron deficiency anemia, unspecified: Secondary | ICD-10-CM | POA: Diagnosis not present

## 2023-02-20 DIAGNOSIS — D649 Anemia, unspecified: Secondary | ICD-10-CM

## 2023-02-20 LAB — CMP (CANCER CENTER ONLY)
ALT: 20 U/L (ref 0–44)
AST: 28 U/L (ref 15–41)
Albumin: 4.6 g/dL (ref 3.5–5.0)
Alkaline Phosphatase: 66 U/L (ref 38–126)
Anion gap: 6 (ref 5–15)
BUN: 17 mg/dL (ref 8–23)
CO2: 31 mmol/L (ref 22–32)
Calcium: 10.6 mg/dL — ABNORMAL HIGH (ref 8.9–10.3)
Chloride: 100 mmol/L (ref 98–111)
Creatinine: 1.11 mg/dL — ABNORMAL HIGH (ref 0.44–1.00)
GFR, Estimated: 55 mL/min — ABNORMAL LOW (ref >60)
Glucose, Bld: 107 mg/dL — ABNORMAL HIGH (ref 70–99)
Potassium: 5.9 mmol/L — ABNORMAL HIGH (ref 3.5–5.1)
Sodium: 137 mmol/L (ref 135–145)
Total Bilirubin: 0.5 mg/dL (ref 0.3–1.2)
Total Protein: 7.5 g/dL (ref 6.5–8.1)

## 2023-02-20 LAB — CBC WITH DIFFERENTIAL (CANCER CENTER ONLY)
Abs Immature Granulocytes: 0.06 10*3/uL (ref 0.00–0.07)
Basophils Absolute: 0.1 10*3/uL (ref 0.0–0.1)
Basophils Relative: 1 %
Eosinophils Absolute: 0.4 10*3/uL (ref 0.0–0.5)
Eosinophils Relative: 4 %
HCT: 49.5 % — ABNORMAL HIGH (ref 36.0–46.0)
Hemoglobin: 14.2 g/dL (ref 12.0–15.0)
Immature Granulocytes: 1 %
Lymphocytes Relative: 15 %
Lymphs Abs: 1.7 10*3/uL (ref 0.7–4.0)
MCH: 22.6 pg — ABNORMAL LOW (ref 26.0–34.0)
MCHC: 28.7 g/dL — ABNORMAL LOW (ref 30.0–36.0)
MCV: 78.9 fL — ABNORMAL LOW (ref 80.0–100.0)
Monocytes Absolute: 0.7 10*3/uL (ref 0.1–1.0)
Monocytes Relative: 6 %
Neutro Abs: 8.6 10*3/uL — ABNORMAL HIGH (ref 1.7–7.7)
Neutrophils Relative %: 73 %
Platelet Count: 506 10*3/uL — ABNORMAL HIGH (ref 150–400)
RBC: 6.27 MIL/uL — ABNORMAL HIGH (ref 3.87–5.11)
WBC Count: 11.5 10*3/uL — ABNORMAL HIGH (ref 4.0–10.5)
nRBC: 0 % (ref 0.0–0.2)

## 2023-02-20 LAB — IRON AND IRON BINDING CAPACITY (CC-WL,HP ONLY)
Iron: 37 ug/dL (ref 28–170)
Saturation Ratios: 10 % — ABNORMAL LOW (ref 10.4–31.8)
TIBC: 375 ug/dL (ref 250–450)
UIBC: 338 ug/dL (ref 148–442)

## 2023-02-20 LAB — FERRITIN: Ferritin: 34 ng/mL (ref 11–307)

## 2023-02-20 MED ORDER — SODIUM CHLORIDE 0.9 % IV SOLN: INTRAVENOUS | Status: DC

## 2023-02-20 NOTE — Patient Instructions (Signed)

## 2023-02-20 NOTE — Progress Notes (Signed)
Katrina Walters presents today for phlebotomy per MD orders. Phlebotomy procedure started at 1032 and ended at 1146. 570 grams removed via 18 gauge needle to right AC. Patient observed for 30 minutes after procedure without any incident while receiving IVF. Patient tolerated procedure well. IV needle removed intact.

## 2023-02-23 ENCOUNTER — Telehealth: Payer: Self-pay | Admitting: *Deleted

## 2023-02-23 ENCOUNTER — Ambulatory Visit (INDEPENDENT_AMBULATORY_CARE_PROVIDER_SITE_OTHER): Payer: Medicare Other | Admitting: *Deleted

## 2023-02-23 DIAGNOSIS — Z0000 Encounter for general adult medical examination without abnormal findings: Secondary | ICD-10-CM

## 2023-02-23 NOTE — Progress Notes (Signed)
Subjective:  Pt completed ADLs, Fall risk, and SDOH during e-check in on 02/22/23.  Answers verified with pt.    Katrina Walters is a 67 y.o. female who presents for Medicare Annual (Subsequent) preventive examination.  I connected with  [name] on 02/23/23 by a audio enabled telemedicine application and verified that I am speaking with the correct person using two identifiers.  Patient Location: Home  Provider Location: Office/Clinic  I discussed the limitations of evaluation and management by telemedicine. The patient expressed understanding and agreed to proceed.   Review of Systems     Cardiac Risk Factors include: advanced age (>6men, >61 women);dyslipidemia     Objective:    Today's Vitals   There is no height or weight on file to calculate BMI.     02/23/2023   11:03 AM 01/23/2023   10:53 AM 12/25/2022    1:04 PM 11/24/2022   11:26 AM 10/20/2022    1:28 PM 09/16/2022    1:51 PM 02/10/2022   10:51 AM  Advanced Directives  Does Patient Have a Medical Advance Directive? No No No No No No No  Would patient like information on creating a medical advance directive? No - Patient declined No - Patient declined No - Patient declined  No - Patient declined No - Patient declined No - Patient declined    Current Medications (verified) Outpatient Encounter Medications as of 02/23/2023  Medication Sig   aspirin 81 MG chewable tablet Chew by mouth daily.   Bismuth 262 MG CHEW Chew 524 mg by mouth in the morning, at noon, in the evening, and at bedtime.   colchicine 0.6 MG tablet Take 1 tablet (0.6 mg total) by mouth 2 (two) times daily.   Cranberry-Vitamin C 250-60 MG CAPS Take 2 tablets by mouth daily.   D-MANNOSE PO Take 2 tablets by mouth daily.   denosumab (PROLIA) 60 MG/ML SOSY injection Inject 60 mg into the skin every 6 (six) months. Pt due 02/06/23   diclofenac (VOLTAREN) 75 MG EC tablet Take 1 tablet (75 mg total) by mouth 2 (two) times daily.   diphenhydrAMINE (BENADRYL) 25  MG tablet Take 25 mg by mouth at bedtime.   ezetimibe (ZETIA) 10 MG tablet Take 1 tablet (10 mg total) by mouth daily. Please keep scheduled appointment for additional refills.   FIBER PO Take 2 tablets by mouth daily.   linaclotide (LINZESS) 290 MCG CAPS capsule Take 1 capsule (290 mcg total) by mouth daily before breakfast.   nitazoxanide (ALINIA) 500 MG tablet Take 1 tablet (500 mg total) by mouth 2 (two) times daily with a meal.   nitrofurantoin, macrocrystal-monohydrate, (MACROBID) 100 MG capsule Take 1 capsule (100 mg total) by mouth at bedtime as needed (after intercourse).   NON FORMULARY Take 0.5 tablets by mouth at bedtime. CBD oil - for pain Gummies   omeprazole (PRILOSEC) 40 MG capsule Take 1 capsule (40 mg total) by mouth daily.   ondansetron (ZOFRAN) 4 MG tablet Take 1 tablet (4 mg total) by mouth every 8 (eight) hours as needed for nausea or vomiting.   pantoprazole (PROTONIX) 40 MG tablet TAKE 1 TABLET BY MOUTH EVERY DAY   Polyethyl Glycol-Propyl Glycol (SYSTANE OP) Place 1 drop into both eyes in the morning and at bedtime.   PREMARIN vaginal cream PLACE 0.25 APPLICATORFULS VAGINALLY SEE ADMIN INSTRUCTIONS. INSERT 0.5 GRAMS VAGINALLY EVERY OTHER NIGHT   rosuvastatin (CRESTOR) 10 MG tablet Take 1 tablet (10 mg total) by mouth daily.   No  facility-administered encounter medications on file as of 02/23/2023.    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   Osteoporosis 09/2016   Polycythemia vera (HCC)     PONV (postoperative nausea and vomiting)    Past Surgical History:  Procedure Laterality Date   ABDOMINAL HYSTERECTOMY  08/2019   ANAL RECTAL MANOMETRY N/A 11/12/2020   Procedure: ANO RECTAL MANOMETRY;  Surgeon: Shellia Cleverly, DO;  Location: [facility];  Service: Gastroenterology;  Laterality: N/A;   BLADDER SURGERY  08/2019   BREAST REDUCTION SURGERY  2006   CARDIAC CATHETERIZATION N/A 07/17/2016   Procedure: Left Heart Cath and Coronary Angiography;  Surgeon: Kathleene Hazel, MD;  Location: [facility] INVASIVE CV LAB;  Service: Cardiovascular;  Laterality: N/A;   CATARACT EXTRACTION W/ INTRAOCULAR LENS IMPLANT Bilateral    COLONOSCOPY  2015   CORONARY ARTERY BYPASS GRAFT N/A 06/04/2021   Procedure: CORONARY ARTERY BYPASS GRAFTING (CABG), ON PUMP, TIMES FOUR, USING LEFT INTERNAL MAMMARY ARTERY, LEFT RADIAL ARTERY (HARVESTED OPEN), AND RIGHT GREATER SAPHENOUS VEIN (HARVESTED ENDOSCOPICALLY);  Surgeon: Corliss Skains, MD;  Location: [facility] OR;  Service: Open Heart Surgery;  Laterality: N/A;   EYE SURGERY     LEFT HEART CATH AND CORONARY ANGIOGRAPHY N/A 04/22/2021   Procedure: LEFT HEART CATH AND CORONARY ANGIOGRAPHY;  Surgeon: Lyn Records, MD;  Location: [facility] INVASIVE CV LAB;  Service: Cardiovascular;  Laterality: N/A;   POLYPECTOMY  2015   RADIAL ARTERY HARVEST Left 06/04/2021   Procedure: RADIAL ARTERY HARVEST;  Surgeon: Corliss Skains, MD;  Location: [facility] OR;  Service: Open Heart Surgery;  Laterality: Left;   TEE WITHOUT CARDIOVERSION N/A 06/04/2021   Procedure: TRANSESOPHAGEAL ECHOCARDIOGRAM (TEE);  Surgeon: Corliss Skains, MD;  Location: [facility] OR;  Service: Open Heart Surgery;  Laterality: N/A;   TONSILLECTOMY     Family History  Problem Relation Age of Onset   Heart disease Mother        died at 70 of heart attack   Hypertension Mother    Heart attack Mother    Heart disease Father        had heart attack at age 33   Heart attack Father    Rectal cancer Father  38   Hearing loss Father    Cancer Paternal Aunt    Stomach cancer Paternal Grandmother    Colon cancer Neg Hx    Colon polyps Neg Hx    Esophageal cancer Neg Hx    Social History   Socioeconomic History   Marital status: Married    Spouse name: Not on file   Number of children: 1   Years of education: Not on file   Highest education level: Not on file  Occupational History   Occupation: unemployed at the time  Tobacco Use   Smoking status: Never   Smokeless tobacco: Never  Vaping Use   Vaping Use: Never used  Substance and Sexual Activity   Alcohol use: Yes    Alcohol/week: 1.0  standard drink of alcohol    Types: 1 Glasses of wine per week    Comment: occassionally   Drug use: No    Comment: CBD gummies   Sexual activity: Yes  Other Topics Concern   Not on file  Social History Narrative   Not on file   Social Determinants of Health   Financial Resource Strain: Medium Risk (02/22/2023)   Overall Financial Resource Strain (CARDIA)    Difficulty of Paying Living Expenses: Somewhat hard  Food Insecurity: Food Insecurity Present (02/22/2023)   Hunger Vital Sign    Worried About Running Out of Food in the Last Year: Sometimes true    Ran Out of Food in the Last Year: Never true  Transportation Needs: No Transportation Needs (02/22/2023)   PRAPARE - Administrator, Civil Service (Medical): No    Lack of Transportation (Non-Medical): No  Physical Activity: Insufficiently Active (02/22/2023)   Exercise Vital Sign    Days of Exercise per Week: 3 days    Minutes of Exercise per Session: 10 min  Stress: No Stress Concern Present (02/22/2023)   [employer] of Occupational Health - Occupational Stress Questionnaire    Feeling of Stress : Not at all  Social Connections: Unknown (02/22/2023)   Social Connection and Isolation Panel [NHANES]    Frequency of Communication with Friends and Family: More than three times a week    Frequency of Social Gatherings with  Friends and Family: Once a week    Attends Religious Services: Not on Marketing executive or Organizations: No    Attends Banker Meetings: Never    Marital Status: Married    Tobacco Counseling Counseling given: Not Answered   Clinical Intake:  Pre-visit preparation completed: Yes  Pain : No/denies pain  Nutritional Risks: None Diabetes: No  How often do you need to have someone help you when you read instructions, pamphlets, or other written materials from your doctor or pharmacy?: 2 - Rarely   Activities of Daily Living    02/22/2023   11:37 AM  In your present state of health, do you have any difficulty performing the following activities:  Hearing? 1  Comment wears hearing aids  Vision? 1  Difficulty concentrating or making decisions? 0  Walking or climbing stairs? 1  Dressing or bathing? 1  Doing errands, shopping? 1  Preparing Food and eating ? Y  Using the Toilet? N  In the past six months, have you accidently leaked urine? Y  Do you have problems with loss of bowel control? N  Managing your Medications? N  Managing your Finances? N  Housekeeping or managing your Housekeeping? N    Patient Care Team: Saguier, Kateri Mc as PCP - General (Physician Assistant) O'Neal, Ronnald Ramp, MD as PCP - Cardiology (Cardiology)  Indicate any recent Medical Services you may have received from other than [facility] providers in the past year (date may be approximate).     Assessment:   This is a routine wellness examination for Betti.  Hearing/Vision screen No results found.  Dietary issues and exercise activities discussed: Current Exercise Habits: Home exercise routine, Type of exercise: treadmill, Time (Minutes): 15, Frequency (Times/Week): 3, Weekly Exercise (Minutes/Week): 45, Intensity: Mild, Exercise limited by: None identified   Goals Addressed   None    Depression Screen    02/23/2023   11:08 AM 12/24/2021    2:25 PM 02/01/2021    11:05 AM 12/22/2016   10:06  AM  PHQ 2/9 Scores  PHQ - 2 Score 0 0 0 0  PHQ- 9 Score    6    Fall Risk    02/22/2023   11:37 AM 12/24/2021    2:34 PM 12/23/2021    3:04 PM  Fall Risk   Falls in the past year? 0 0 0  Number falls in past yr: 0 0   Injury with Fall? 0 0   Risk for fall due to : No Fall Risks No Fall Risks   Follow up Falls evaluation completed      FALL RISK PREVENTION PERTAINING TO THE HOME:  Any stairs in or around the home? No  Home free of loose throw rugs in walkways, pet beds, electrical cords, etc? Yes  Adequate lighting in your home to reduce risk of falls? Yes   ASSISTIVE DEVICES UTILIZED TO PREVENT FALLS:  Life alert? No  Use of a cane, walker or w/c? Yes  Grab bars in the bathroom? Yes  Shower chair or bench in shower?  Built in seat Elevated toilet seat or a handicapped toilet? No   TIMED UP AND GO:  Was the test performed?  No, audio visit .    Cognitive Function:        02/23/2023   11:11 AM 12/24/2021    2:36 PM  6CIT Screen  What Year? 0 points 0 points  What month? 0 points 0 points  What time? 0 points 0 points  Count back from 20 0 points 0 points  Months in reverse 0 points 0 points  Repeat phrase 0 points 0 points  Total Score 0 points 0 points    Immunizations Immunization History  Administered Date(s) Administered   Influenza-Unspecified 06/22/2022   PFIZER(Purple Top)SARS-COV-2 Vaccination 12/03/2019   PNEUMOCOCCAL CONJUGATE-20 06/18/2022   Pneumococcal-Unspecified 06/22/2022   Respiratory Syncytial Virus Vaccine,Recomb Aduvanted(Arexvy) 06/22/2022   Td 07/12/2014   Tdap 06/08/2020   Zoster Recombinat (Shingrix) 04/25/2020, 03/22/2021    TDAP status: Up to date  Flu Vaccine status: Up to date  Pneumococcal vaccine status: Up to date  Covid-19 vaccine status: Information provided on how to obtain vaccines.   Qualifies for Shingles Vaccine? Yes   Zostavax completed No   Shingrix Completed?: Yes  Screening  Tests Health Maintenance  Topic Date Due   Hepatitis C Screening  Never done   COVID-19 Vaccine (2 - Pfizer risk series) 12/24/2019   INFLUENZA VACCINE  04/09/2023   Medicare Annual Wellness (AWV)  02/23/2024   MAMMOGRAM  01/05/2025   Colonoscopy  03/31/2027   DTaP/Tdap/Td (3 - Td or Tdap) 06/08/2030   Pneumonia Vaccine 61+ Years old  Completed   DEXA SCAN  Completed   Zoster Vaccines- Shingrix  Completed   HPV VACCINES  Aged Out    Health Maintenance  Health Maintenance Due  Topic Date Due   Hepatitis C Screening  Never done   COVID-19 Vaccine (2 - Pfizer risk series) 12/24/2019    Colorectal cancer screening: Type of screening: Colonoscopy. Completed 03/30/20. Repeat every 7 years  Mammogram status: Completed 01/06/23. Repeat every year  Bone Density status: Completed 10/03/20. Results reflect: Bone density results: OSTEOPOROSIS. Repeat every 2 years.  Lung Cancer Screening: (Low Dose CT Chest recommended if Age 56-80 years, 30 pack-year currently smoking OR have quit w/in 15years.) does not qualify.   Additional Screening:  Hepatitis C Screening: does qualify; Completed N/a  Vision Screening: Recommended annual ophthalmology exams for early detection of glaucoma and other disorders  of the eye. Is the patient up to date with their annual eye exam?  Yes  Who is the provider or what is the name of the office in which the patient attends annual eye exams? [facility]. If pt is not established with a provider, would they like to be referred to a provider to establish care? No .   Dental Screening: Recommended annual dental exams for proper oral hygiene  Community Resource Referral / Chronic Care Management: CRR required this visit?  No   CCM required this visit?  No      Plan:     I have personally reviewed and noted the following in the patient's chart:   Medical and social history Use of alcohol, tobacco or illicit drugs  Current medications and supplements  including opioid prescriptions. Patient is not currently taking opioid prescriptions. Functional ability and status Nutritional status Physical activity Advanced directives List of other physicians Hospitalizations, surgeries, and ER visits in previous 12 months Vitals Screenings to include cognitive, depression, and falls Referrals and appointments  In addition, I have reviewed and discussed with patient certain preventive protocols, quality metrics, and best practice recommendations. A written personalized care plan for preventive services as well as general preventive health recommendations were provided to patient.   Due to this being a telephonic visit, the after visit summary with patients personalized plan was offered to patient via mail or my-chart. Patient would like to access on my-chart.  Donne Anon, [place]   02/23/2023   Nurse Notes: None

## 2023-02-23 NOTE — Telephone Encounter (Signed)
Per scheduling message - Sarah - Called patient and lvm for a call back to schedule (3) doses of IV Iron 

## 2023-02-23 NOTE — Patient Instructions (Signed)
[name] , Thank you for taking time to come for your Medicare Wellness Visit. I appreciate your ongoing commitment to your health goals. Please review the following plan we discussed and let me know if I can assist you in the future.     This is a list of the screening recommended for you and due dates:  Health Maintenance  Topic Date Due   Hepatitis C Screening  Never done   COVID-19 Vaccine (2 - Pfizer risk series) 12/24/2019   Flu Shot  04/09/2023   Medicare Annual Wellness Visit  02/23/2024   Mammogram  01/05/2025   Colon Cancer Screening  03/31/2027   DTaP/Tdap/Td vaccine (3 - Td or Tdap) 06/08/2030   Pneumonia Vaccine  Completed   DEXA scan (bone density measurement)  Completed   Zoster (Shingles) Vaccine  Completed   HPV Vaccine  Aged Out    Next appointment: Follow up in one year for your annual wellness visit.   Preventive Care 44 Years and Older, Female Preventive care refers to lifestyle choices and visits with your health care provider that can promote health and wellness. What does preventive care include? A yearly physical exam. This is also called an annual well check. Dental exams once or twice a year. Routine eye exams. Ask your health care provider how often you should have your eyes checked. Personal lifestyle choices, including: Daily care of your teeth and gums. Regular physical activity. Eating a healthy diet. Avoiding tobacco and drug use. Limiting alcohol use. Practicing safe sex. Taking low-dose aspirin every day. Taking vitamin and mineral supplements as recommended by your health care provider. What happens during an annual well check? The services and screenings done by your health care provider during your annual well check will depend on your age, overall health, lifestyle risk factors, and family history of disease. Counseling  Your health care provider may ask you questions about your: Alcohol use. Tobacco use. Drug use. Emotional  well-being. Home and relationship well-being. Sexual activity. Eating habits. History of falls. Memory and ability to understand (cognition). Work and work Astronomer. Reproductive health. Screening  You may have the following tests or measurements: Height, weight, and BMI. Blood pressure. Lipid and cholesterol levels. These may be checked every 5 years, or more frequently if you are over 66 years old. Skin check. Lung cancer screening. You may have this screening every year starting at age 64 if you have a 30-pack-year history of smoking and currently smoke or have quit within the past 15 years. Fecal occult blood test (FOBT) of the stool. You may have this test every year starting at age 27. Flexible sigmoidoscopy or colonoscopy. You may have a sigmoidoscopy every 5 years or a colonoscopy every 10 years starting at age 49. Hepatitis C blood test. Hepatitis B blood test. Sexually transmitted disease (STD) testing. Diabetes screening. This is done by checking your blood sugar (glucose) after you have not eaten for a while (fasting). You may have this done every 1-3 years. Bone density scan. This is done to screen for osteoporosis. You may have this done starting at age 17. Mammogram. This may be done every 1-2 years. Talk to your health care provider about how often you should have regular mammograms. Talk with your health care provider about your test results, treatment options, and if necessary, the need for more tests. Vaccines  Your health care provider may recommend certain vaccines, such as: Influenza vaccine. This is recommended every year. Tetanus, diphtheria, and acellular pertussis (Tdap,  Td) vaccine. You may need a Td booster every 10 years. Zoster vaccine. You may need this after age 37. Pneumococcal 13-valent conjugate (PCV13) vaccine. One dose is recommended after age 2. Pneumococcal polysaccharide (PPSV23) vaccine. One dose is recommended after age 17. Talk to your  health care provider about which screenings and vaccines you need and how often you need them. This information is not intended to replace advice given to you by your health care provider. Make sure you discuss any questions you have with your health care provider. Document Released: 09/21/2015 Document Revised: 05/14/2016 Document Reviewed: 06/26/2015 Elsevier Interactive Patient Education  2017 [place] Prevention in the Home Falls can cause injuries. They can happen to people of all ages. There are many things you can do to make your home safe and to help prevent falls. What can I do on the outside of my home? Regularly fix the edges of walkways and driveways and fix any cracks. Remove anything that might make you trip as you walk through a door, such as a raised step or threshold. Trim any bushes or trees on the path to your home. Use bright outdoor lighting. Clear any walking paths of anything that might make someone trip, such as rocks or tools. Regularly check to see if handrails are loose or broken. Make sure that both sides of any steps have handrails. Any raised decks and porches should have guardrails on the edges. Have any leaves, snow, or ice cleared regularly. Use sand or salt on walking paths during winter. Clean up any spills in your garage right away. This includes oil or grease spills. What can I do in the bathroom? Use night lights. Install grab bars by the toilet and in the tub and shower. Do not use towel bars as grab bars. Use non-skid mats or decals in the tub or shower. If you need to sit down in the shower, use a plastic, non-slip stool. Keep the floor dry. Clean up any water that spills on the floor as soon as it happens. Remove soap buildup in the tub or shower regularly. Attach bath mats securely with double-sided non-slip rug tape. Do not have throw rugs and other things on the floor that can make you trip. What can I do in the bedroom? Use night  lights. Make sure that you have a light by your bed that is easy to reach. Do not use any sheets or blankets that are too big for your bed. They should not hang down onto the floor. Have a firm chair that has side arms. You can use this for support while you get dressed. Do not have throw rugs and other things on the floor that can make you trip. What can I do in the kitchen? Clean up any spills right away. Avoid walking on wet floors. Keep items that you use a lot in easy-to-reach places. If you need to reach something above you, use a strong step stool that has a grab bar. Keep electrical cords out of the way. Do not use floor polish or wax that makes floors slippery. If you must use wax, use non-skid floor wax. Do not have throw rugs and other things on the floor that can make you trip. What can I do with my stairs? Do not leave any items on the stairs. Make sure that there are handrails on both sides of the stairs and use them. Fix handrails that are broken or loose. Make sure that handrails are as  long as the stairways. Check any carpeting to make sure that it is firmly attached to the stairs. Fix any carpet that is loose or worn. Avoid having throw rugs at the top or bottom of the stairs. If you do have throw rugs, attach them to the floor with carpet tape. Make sure that you have a light switch at the top of the stairs and the bottom of the stairs. If you do not have them, ask someone to add them for you. What else can I do to help prevent falls? Wear shoes that: Do not have high heels. Have rubber bottoms. Are comfortable and fit you well. Are closed at the toe. Do not wear sandals. If you use a stepladder: Make sure that it is fully opened. Do not climb a closed stepladder. Make sure that both sides of the stepladder are locked into place. Ask someone to hold it for you, if possible. Clearly mark and make sure that you can see: Any grab bars or handrails. First and last  steps. Where the edge of each step is. Use tools that help you move around (mobility aids) if they are needed. These include: Canes. Walkers. Scooters. Crutches. Turn on the lights when you go into a dark area. Replace any light bulbs as soon as they burn out. Set up your furniture so you have a clear path. Avoid moving your furniture around. If any of your floors are uneven, fix them. If there are any pets around you, be aware of where they are. Review your medicines with your doctor. Some medicines can make you feel dizzy. This can increase your chance of falling. Ask your doctor what other things that you can do to help prevent falls. This information is not intended to replace advice given to you by your health care provider. Make sure you discuss any questions you have with your health care provider. Document Released: 06/21/2009 Document Revised: 01/31/2016 Document Reviewed: 09/29/2014 Elsevier Interactive Patient Education  2017 [employer].

## 2023-02-24 ENCOUNTER — Encounter: Payer: Self-pay | Admitting: Family

## 2023-02-25 ENCOUNTER — Other Ambulatory Visit: Payer: Self-pay

## 2023-02-25 DIAGNOSIS — E875 Hyperkalemia: Secondary | ICD-10-CM

## 2023-02-26 ENCOUNTER — Inpatient Hospital Stay: Payer: Medicare Other

## 2023-02-26 DIAGNOSIS — D509 Iron deficiency anemia, unspecified: Secondary | ICD-10-CM

## 2023-02-26 DIAGNOSIS — D45 Polycythemia vera: Secondary | ICD-10-CM | POA: Diagnosis not present

## 2023-02-26 LAB — IRON AND IRON BINDING CAPACITY (CC-WL,HP ONLY)
Iron: 28 ug/dL (ref 28–170)
Saturation Ratios: 7 % — ABNORMAL LOW (ref 10.4–31.8)
TIBC: 379 ug/dL (ref 250–450)
UIBC: 351 ug/dL (ref 148–442)

## 2023-02-26 LAB — CMP (CANCER CENTER ONLY)
ALT: 23 U/L (ref 0–44)
AST: 29 U/L (ref 15–41)
Albumin: 4.5 g/dL (ref 3.5–5.0)
Alkaline Phosphatase: 60 U/L (ref 38–126)
Anion gap: 7 (ref 5–15)
BUN: 13 mg/dL (ref 8–23)
CO2: 29 mmol/L (ref 22–32)
Calcium: 9.8 mg/dL (ref 8.9–10.3)
Chloride: 100 mmol/L (ref 98–111)
Creatinine: 0.86 mg/dL (ref 0.44–1.00)
GFR, Estimated: >60 mL/min (ref >60)
Glucose, Bld: 96 mg/dL (ref 70–99)
Potassium: 4.6 mmol/L (ref 3.5–5.1)
Sodium: 136 mmol/L (ref 135–145)
Total Bilirubin: 0.6 mg/dL (ref 0.3–1.2)
Total Protein: 7.2 g/dL (ref 6.5–8.1)

## 2023-02-26 LAB — CBC WITH DIFFERENTIAL (CANCER CENTER ONLY)
Abs Immature Granulocytes: 0.06 10*3/uL (ref 0.00–0.07)
Basophils Absolute: 0.1 10*3/uL (ref 0.0–0.1)
Basophils Relative: 1 %
Eosinophils Absolute: 0.3 10*3/uL (ref 0.0–0.5)
Eosinophils Relative: 3 %
HCT: 43.6 % (ref 36.0–46.0)
Hemoglobin: 12.7 g/dL (ref 12.0–15.0)
Immature Granulocytes: 1 %
Lymphocytes Relative: 18 %
Lymphs Abs: 1.9 10*3/uL (ref 0.7–4.0)
MCH: 22.7 pg — ABNORMAL LOW (ref 26.0–34.0)
MCHC: 29.1 g/dL — ABNORMAL LOW (ref 30.0–36.0)
MCV: 78 fL — ABNORMAL LOW (ref 80.0–100.0)
Monocytes Absolute: 0.5 10*3/uL (ref 0.1–1.0)
Monocytes Relative: 5 %
Neutro Abs: 7.7 10*3/uL (ref 1.7–7.7)
Neutrophils Relative %: 72 %
Platelet Count: 504 10*3/uL — ABNORMAL HIGH (ref 150–400)
RBC: 5.59 MIL/uL — ABNORMAL HIGH (ref 3.87–5.11)
RDW: 27.8 % — ABNORMAL HIGH (ref 11.5–15.5)
WBC Count: 10.6 10*3/uL — ABNORMAL HIGH (ref 4.0–10.5)
nRBC: 0 % (ref 0.0–0.2)

## 2023-02-26 LAB — FERRITIN: Ferritin: 22 ng/mL (ref 11–307)

## 2023-03-03 ENCOUNTER — Other Ambulatory Visit: Payer: Medicare Other

## 2023-03-03 ENCOUNTER — Ambulatory Visit: Payer: Medicare Other

## 2023-03-04 ENCOUNTER — Encounter: Payer: Self-pay | Admitting: Gastroenterology

## 2023-03-04 ENCOUNTER — Ambulatory Visit: Payer: Medicare Other | Admitting: Gastroenterology

## 2023-03-04 VITALS — BP 102/60 | HR 75 | Ht 63.0 in | Wt 100.0 lb

## 2023-03-04 DIAGNOSIS — K2970 Gastritis, unspecified, without bleeding: Secondary | ICD-10-CM

## 2023-03-04 DIAGNOSIS — Z86010 Personal history of colonic polyps: Secondary | ICD-10-CM | POA: Diagnosis not present

## 2023-03-04 DIAGNOSIS — B9681 Helicobacter pylori [H. pylori] as the cause of diseases classified elsewhere: Secondary | ICD-10-CM

## 2023-03-04 DIAGNOSIS — K581 Irritable bowel syndrome with constipation: Secondary | ICD-10-CM | POA: Diagnosis not present

## 2023-03-04 DIAGNOSIS — K648 Other hemorrhoids: Secondary | ICD-10-CM

## 2023-03-04 NOTE — Patient Instructions (Addendum)
You have been scheduled for an appointment with Dr. Barron Alvine on 04/28/23 at 8:20 AM . Please arrive 10 minutes early for your appointment.     HEMORRHOID BANDING PROCEDURE    FOLLOW-UP CARE   The procedure you have had should have been relatively painless since the banding of the area involved does not have nerve endings and there is no pain sensation.  The rubber band cuts off the blood supply to the hemorrhoid and the band may fall off as soon as 48 hours after the banding (the band may occasionally be seen in the toilet bowl following a bowel movement). You may notice a temporary feeling of fullness in the rectum which should respond adequately to plain Tylenol or Motrin.  Following the banding, avoid strenuous exercise that evening and resume full activity the next day.  A sitz bath (soaking in a warm tub) or bidet is soothing, and can be useful for cleansing the area after bowel movements.     To avoid constipation, take two tablespoons of natural wheat bran, natural oat bran, flax, Benefiber or any over the counter fiber supplement and increase your water intake to 7-8 glasses daily.    Unless you have been prescribed anorectal medication, do not put anything inside your rectum for two weeks: No suppositories, enemas, fingers, etc.  Occasionally, you may have more bleeding than usual after the banding procedure.  This is often from the untreated hemorrhoids rather than the treated one.  Don't be concerned if there is a tablespoon or so of blood.  If there is more blood than this, lie flat with your bottom higher than your head and apply an ice pack to the area. If the bleeding does not stop within a half an hour or if you feel faint, call our office at [phone] or go to the emergency room.  Problems are not common; however, if there is a substantial amount of bleeding, severe pain, chills, fever or difficulty passing urine (very rare) or other problems, you should call us at [phone]  [phone] or report to the nearest emergency room.  Do not stay seated continuously for more than 2-3 hours for a day or two after the procedure.  Tighten your buttock muscles 10-15 times every two hours and take 10-15 deep breaths every 1-2 hours.  Do not spend more than a few minutes on the toilet if you cannot empty your bowel; instead re-visit the toilet at a later time.     Thank you for choosing me and [facility].  Vito Cirigliano, D.O.

## 2023-03-04 NOTE — Progress Notes (Signed)
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  Previously trialed Dulcolax qod and increased hydration without much response, MiraLAX. - 02/2020: Abdominal x-ray with abundant stool burden.  Trialed Amitiza in 02/2020 with good efficacy but stopped due to upset stomach. - 03/2020: Started on Linzess with good clinical response, but reduced efficacy in early 2022.  (Interestingly, constipation started after hysterectomy in 2020.  Prior baseline was BM Q3d without straining) -10/2020: Sitz marker study: 10 markers remain with 9 in the transverse colon indicating slow colonic transit.  Started Trulance -11/2020: ARM: Decreased resting pressure with normal squeeze.  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  Referred to PT - 11/2020: Trulance was suboptimally efficacious.  Stopped and went back to Linzess 290 mcg -09/2022: GI follow-up appointment.  Completed pelvic floor PT and constipation well-controlled with Linzess with BM every 1-2 days without straining.  Was having BRB on tissue paper.  New issue was reflux, well-controlled with omeprazole 40 mg daily. - 10/09/2022: EGD: 1 cm sliding HH with Hill grade 3 valve, mild gastritis (path: H. pylori positive).  Normal duodenum (path benign)   History of internal hemorrhoids with intermittent scant BRBPR, now incompletely responsive to OTC therapy. - 12/23/2022: Banding of RA hemorrhoid   Family history notable for father with colon cancer in his 23s.   Endoscopic history: -Colonoscopy (08/17/2014, Dr. Clearence Cheek at [facility]): Single small polypoid polyp at splenic flexure removed  with snare (path: Unknown), Internal hemorrhoids. -Colonoscopy (03/2020, Dr. Barron Alvine): 2 rectal polyps (1 tubular adenoma), tortuous sigmoid colon, lipoma in the ascending colon, internal hemorrhoids.  Repeat in 7 years  HPI:     Patient is a 67 y.o. femalewith a history of symptomatic internal hemorrhoids presenting to the Gastroenterology Clinic for follow-up and ongoing treatment. The patient presents with symptomatic grade 2 hemorrhoids, unresponsive to maximal medical therapy, requesting rubber band ligation of symptomatic hemorrhoidal disease.  Did well with initial hemorrhoid banding in April.  She would also like to discuss her chronic constipation.  She is taking Linzess as prescribed, and had been doing well, but recent exacerbation of chronic constipation.  Started taking MiraLAX again over the last several days, along with glycerin suppository yesterday.  Otherwise maintaining good hydration, active lifestyle, regular walking, and still doing exercises for pelvic floor retraining/strengthening.   No change in medical or surgical history, medications, allergies, social history since last appointment with me.   Review of systems:     No chest pain, no SOB, no fevers, no urinary sx   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 59 years in [place], managed on colchicine   Osteoporosis 09/2016   Polycythemia vera (HCC)    PONV (postoperative nausea and vomiting)     Patient's surgical history, family medical history, social history, medications and allergies were all reviewed in Epic  Current Outpatient Medications  Medication Sig Dispense Refill   aspirin 81 MG chewable tablet Chew by mouth daily.      Bismuth 262 MG CHEW Chew 524 mg by mouth in the morning, at noon, in the evening, and at bedtime. 112 tablet 0   colchicine 0.6 MG tablet Take 1 tablet (0.6 mg total) by mouth 2 (two) times daily. 180 tablet 3   Cranberry-Vitamin C 250-60 MG CAPS Take 2 tablets by mouth daily.     D-MANNOSE PO Take 2 tablets by mouth daily.     denosumab (PROLIA) 60 MG/ML SOSY injection Inject 60 mg into the skin every 6 (six) months. Pt due 02/06/23 1 mL 0   diclofenac (VOLTAREN) 75 MG EC tablet Take 1 tablet (75 mg total) by mouth 2 (two) times daily. 20 tablet 0   diphenhydrAMINE (BENADRYL) 25 MG tablet Take 25 mg by mouth at bedtime.     ezetimibe (ZETIA) 10 MG tablet Take 1 tablet (10 mg total) by mouth daily. Please keep scheduled appointment for additional refills. 90 tablet 3   FIBER PO Take 2 tablets by mouth daily.     linaclotide (LINZESS) 290 MCG CAPS capsule Take 1 capsule (290 mcg total) by mouth daily before breakfast. 90 capsule 5   nitazoxanide (ALINIA) 500 MG tablet Take 1 tablet (500 mg total) by mouth 2 (two) times daily with a meal. 20 tablet 0   nitrofurantoin, macrocrystal-monohydrate, (MACROBID) 100 MG capsule Take 1 capsule (100 mg total) by mouth at bedtime as needed (after intercourse). 90 capsule 3   NON FORMULARY Take 0.5 tablets by mouth at bedtime. CBD oil - for pain Gummies     omeprazole (PRILOSEC) 40 MG capsule Take 1 capsule (40 mg total) by mouth daily. 20 capsule 0   ondansetron (ZOFRAN) 4 MG tablet Take 1 tablet (4 mg total) by mouth every 8 (eight) hours as needed for nausea or vomiting. 30 tablet 1   pantoprazole (PROTONIX) 40 MG tablet TAKE 1 TABLET BY MOUTH EVERY DAY 90 tablet 1   Polyethyl Glycol-Propyl Glycol (SYSTANE OP) Place 1 drop into both eyes in the morning and at bedtime.     PREMARIN vaginal cream PLACE 0.25 APPLICATORFULS VAGINALLY SEE ADMIN INSTRUCTIONS. INSERT 0.5 GRAMS VAGINALLY EVERY OTHER NIGHT 30 g 12   rosuvastatin (CRESTOR) 10 MG tablet Take 1 tablet (10  mg total) by mouth daily. 90 tablet 3   No current facility-administered medications for this visit.    Physical Exam:     BP 102/60   Pulse 75   Ht 5\' 3"  (1.6 m)   Wt 100 lb (45.4 kg)   LMP  (LMP Unknown)   BMI 17.71 kg/m   GENERAL:  Pleasant female in NAD PSYCH: : Cooperative, normal affect NEURO: Alert and oriented x 3, no focal neurologic deficits Rectal exam: Sensation intact and preserved anal wink.  External skin tags.  Grade 2 hemorrhoids noted in LL and RP positions on exam.  No external anal fissures noted. Normal sphincter tone. No palpable mass. No blood on the exam glove. (Chaperone: Ailene Rud, CMA).   IMPRESSION and PLAN:    1)  Symptomatic internal hemorrhoids: PROCEDURE NOTE: The patient presents with symptomatic grade 2 hemorrhoids, unresponsive to maximal medical therapy, requesting rubber band ligation of symptomatic hemorrhoidal disease.  All risks, benefits and alternative forms of therapy were described and informed consent was obtained.  In the Left Lateral Decubitus position, anoscopic examination revealed grade 2 hemorrhoids in the LL  and RP position(s).  The anorectum was pre-medicated with RectiCare. The decision was made to band the LL internal hemorrhoid, and the [facility] O'Regan System was used to perform band ligation without complication.  Digital anorectal examination was then performed to assure proper positioning of the band, and to adjust the banded tissue as required.  The patient was discharged home without pain or other issues.  Dietary and behavioral recommendations were given and along with follow-up instructions.     The following adjunctive treatments were recommended:  -Resume high-fiber diet with fiber supplement (i.e. Citrucel or Benefiber) with goal for soft stools without straining to have a BM. -Resume adequate fluid intake.  The patient will return in 4 for follow-up and possible additional banding as required. No complications  were encountered and the patient tolerated the procedure well.      2) H. pylori gastritis Did not tolerate quadruple therapy x 2, with limited antibiotic exposure.  Diatherix panel in 12/2022 was again positive, and no clarithromycin resistance was detected.  Given her intolerance to antibiotics in the past and reported PCN allergy, was referred to the ID clinic.  Also discussed possible referral to Allergy for PCN testing/desensitization pending ID evaluation. - Will follow-up on referral to the ID clinic - Will defer referral to Allergy Clinic for consideration of desensitization pending ID evaluation   3) IBS-C - Continue Linzess - Continue MiraLAX, but recommended taking regularly as 1 every other day to start and titrate to effect  4) Heartburn 5) GERD - Well-controlled with omeprazole   6) History of colon polyps - Repeat colonoscopy in 2028 for ongoing polyp surveillance    I spent an additional 20 minutes of nonprocedural time, including independent review of results as outlined above, communicating results with the patient directly, face-to-face time with the patient, coordinating care, ordering studies and medications as appropriate, and documentation.      Shellia Cleverly ,DO, FACG 03/04/2023, 11:33 AM

## 2023-03-10 ENCOUNTER — Ambulatory Visit: Payer: Medicare Other

## 2023-03-15 ENCOUNTER — Encounter: Payer: Self-pay | Admitting: Gastroenterology

## 2023-03-17 ENCOUNTER — Ambulatory Visit: Payer: Medicare Other

## 2023-03-20 ENCOUNTER — Inpatient Hospital Stay: Payer: Medicare Other

## 2023-03-20 ENCOUNTER — Other Ambulatory Visit: Payer: Self-pay | Admitting: Family

## 2023-03-20 ENCOUNTER — Inpatient Hospital Stay: Payer: Medicare Other | Attending: Hematology & Oncology | Admitting: Family

## 2023-03-20 ENCOUNTER — Encounter: Payer: Self-pay | Admitting: Family

## 2023-03-20 VITALS — BP 127/70 | HR 75 | Temp 97.9°F | Resp 17 | Wt 103.1 lb

## 2023-03-20 DIAGNOSIS — D45 Polycythemia vera: Secondary | ICD-10-CM

## 2023-03-20 DIAGNOSIS — D509 Iron deficiency anemia, unspecified: Secondary | ICD-10-CM | POA: Diagnosis not present

## 2023-03-20 DIAGNOSIS — E875 Hyperkalemia: Secondary | ICD-10-CM | POA: Diagnosis not present

## 2023-03-20 DIAGNOSIS — E876 Hypokalemia: Secondary | ICD-10-CM | POA: Diagnosis not present

## 2023-03-20 DIAGNOSIS — R109 Unspecified abdominal pain: Secondary | ICD-10-CM | POA: Diagnosis not present

## 2023-03-20 LAB — CBC WITH DIFFERENTIAL (CANCER CENTER ONLY)
Abs Immature Granulocytes: 0.08 10*3/uL — ABNORMAL HIGH (ref 0.00–0.07)
Basophils Absolute: 0.1 10*3/uL (ref 0.0–0.1)
Basophils Relative: 1 %
Eosinophils Absolute: 0.4 10*3/uL (ref 0.0–0.5)
Eosinophils Relative: 3 %
HCT: 45 % (ref 36.0–46.0)
Hemoglobin: 13.1 g/dL (ref 12.0–15.0)
Immature Granulocytes: 1 %
Lymphocytes Relative: 15 %
Lymphs Abs: 1.8 10*3/uL (ref 0.7–4.0)
MCH: 22.2 pg — ABNORMAL LOW (ref 26.0–34.0)
MCHC: 29.1 g/dL — ABNORMAL LOW (ref 30.0–36.0)
MCV: 76.1 fL — ABNORMAL LOW (ref 80.0–100.0)
Monocytes Absolute: 0.7 10*3/uL (ref 0.1–1.0)
Monocytes Relative: 6 %
Neutro Abs: 9.2 10*3/uL — ABNORMAL HIGH (ref 1.7–7.7)
Neutrophils Relative %: 74 %
Platelet Count: 460 10*3/uL — ABNORMAL HIGH (ref 150–400)
RBC: 5.91 MIL/uL — ABNORMAL HIGH (ref 3.87–5.11)
RDW: 23.9 % — ABNORMAL HIGH (ref 11.5–15.5)
WBC Count: 12.3 10*3/uL — ABNORMAL HIGH (ref 4.0–10.5)
nRBC: 0 % (ref 0.0–0.2)

## 2023-03-20 LAB — CMP (CANCER CENTER ONLY)
ALT: 12 U/L (ref 0–44)
AST: 20 U/L (ref 15–41)
Albumin: 4.5 g/dL (ref 3.5–5.0)
Alkaline Phosphatase: 60 U/L (ref 38–126)
Anion gap: 4 — ABNORMAL LOW (ref 5–15)
BUN: 10 mg/dL (ref 8–23)
CO2: 31 mmol/L (ref 22–32)
Calcium: 10.1 mg/dL (ref 8.9–10.3)
Chloride: 100 mmol/L (ref 98–111)
Creatinine: 0.91 mg/dL (ref 0.44–1.00)
GFR, Estimated: >60 mL/min (ref >60)
Glucose, Bld: 91 mg/dL (ref 70–99)
Potassium: 6.1 mmol/L — ABNORMAL HIGH (ref 3.5–5.1)
Sodium: 135 mmol/L (ref 135–145)
Total Bilirubin: 0.5 mg/dL (ref 0.3–1.2)
Total Protein: 7.4 g/dL (ref 6.5–8.1)

## 2023-03-20 NOTE — Progress Notes (Signed)
Hematology and Oncology Follow Up Visit  Katrina Walters [id number] 1956-04-29 67 y.o. 03/20/2023   Principle Diagnosis:  Polycythemia Vera -- JAK2 (+)   Current Therapy:        Phlebotomy to maintain HCT below 45% Aspirin 81 mg PO daily               Interim History:  [name] is here today with her husband for follow-up. She has had some issues with constipation and abdominal discomfort. She has spoken with GI and they have changed some of her medications around including stopping miralax and starting Benefiber.  No blood loss noted. No bruising or petechiae.  She also is noted to have elevated potassium level again. She states that this has occurred off and on for many years. She is not symptomatic at this time and will come back in on Monday for recheck. She had french fries yesterday.  She will avoid potassium rich foods.  No fever, chills, n/v, cough, rash, dizziness, SOB, chest pain, palpitations or changes in bladder habits.  No swelling, numbness or tingling in her extremities at this time. No falls or syncope.  Appetite and hydration are good. Weight is 103 lbs.    ECOG Performance Status: 1 - Symptomatic but completely ambulatory  Medications:  Allergies as of 03/20/2023       Reactions   Amoxicillin    Had fever with flu like symptoms. Has patient had a PCN reaction causing immediate rash, facial/tongue/throat swelling, SOB or lightheadedness with hypotension: no Has patient had a PCN reaction causing severe rash involving mucus membranes or skin necrosis: no Has patient had a PCN reaction that required hospitalization no Has patient had a PCN reaction occurring within the last 10 years: yes If all of the above answers are "NO", then may proceed with Cephalosporin use.   Clopidogrel & Aspirin Itching   Amlodipine Nausea Only   Dizzy, heavy feeling, tongue burning   Atorvastatin Other (See Comments)   Myalgia   Crestor [rosuvastatin] Itching   Metoprolol Other (See  Comments)   hair loss   Pravastatin Itching   Repatha [evolocumab] Itching        Medication List        Accurate as of March 20, 2023 10:17 AM. If you have any questions, ask your nurse or doctor.          aspirin 81 MG chewable tablet Chew by mouth daily.   BENEFIBER DRINK MIX PO Take by mouth daily at 6 (six) AM.   Bismuth 262 MG Chew Chew 524 mg by mouth in the morning, at noon, in the evening, and at bedtime.   colchicine 0.6 MG tablet Take 1 tablet (0.6 mg total) by mouth 2 (two) times daily.   Cranberry-Vitamin C 250-60 MG Caps Take 2 tablets by mouth daily.   D-MANNOSE PO Take 2 tablets by mouth daily.   diclofenac 75 MG EC tablet Commonly known as: VOLTAREN Take 1 tablet (75 mg total) by mouth 2 (two) times daily.   diphenhydrAMINE 25 MG tablet Commonly known as: BENADRYL Take 25 mg by mouth at bedtime.   ezetimibe 10 MG tablet Commonly known as: ZETIA Take 1 tablet (10 mg total) by mouth daily. Please keep scheduled appointment for additional refills.   FIBER PO Take 2 tablets by mouth daily.   linaclotide 290 MCG Caps capsule Commonly known as: LINZESS Take 1 capsule (290 mcg total) by mouth daily before breakfast.   nitazoxanide 500 MG tablet Commonly  known as: ALINIA Take 1 tablet (500 mg total) by mouth 2 (two) times daily with a meal.   nitrofurantoin (macrocrystal-monohydrate) 100 MG capsule Commonly known as: MACROBID Take 1 capsule (100 mg total) by mouth at bedtime as needed (after intercourse).   NON FORMULARY Take 0.5 tablets by mouth at bedtime. CBD oil - for pain Gummies   omeprazole 40 MG capsule Commonly known as: PRILOSEC Take 1 capsule (40 mg total) by mouth daily.   ondansetron 4 MG tablet Commonly known as: ZOFRAN Take 1 tablet (4 mg total) by mouth every 8 (eight) hours as needed for nausea or vomiting.   pantoprazole 40 MG tablet Commonly known as: PROTONIX TAKE 1 TABLET BY MOUTH EVERY DAY   Premarin vaginal  cream Generic drug: conjugated estrogens PLACE 0.25 APPLICATORFULS VAGINALLY SEE ADMIN INSTRUCTIONS. INSERT 0.5 GRAMS VAGINALLY EVERY OTHER NIGHT   Prolia 60 MG/ML Sosy injection Generic drug: denosumab Inject 60 mg into the skin every 6 (six) months. Pt due 02/06/23   rosuvastatin 10 MG tablet Commonly known as: CRESTOR Take 1 tablet (10 mg total) by mouth daily.   SYSTANE OP Place 1 drop into both eyes in the morning and at bedtime.        Allergies:  Allergies  Allergen Reactions   Amoxicillin     Had fever with flu like symptoms. Has patient had a PCN reaction causing immediate rash, facial/tongue/throat swelling, SOB or lightheadedness with hypotension: no Has patient had a PCN reaction causing severe rash involving mucus membranes or skin necrosis: no Has patient had a PCN reaction that required hospitalization no Has patient had a PCN reaction occurring within the last 10 years: yes If all of the above answers are "NO", then may proceed with Cephalosporin use.   Clopidogrel & Aspirin Itching   Amlodipine Nausea Only    Dizzy, heavy feeling, tongue burning   Atorvastatin Other (See Comments)    Myalgia   Crestor [Rosuvastatin] Itching   Metoprolol Other (See Comments)    hair loss   Pravastatin Itching   Repatha [Evolocumab] Itching    Past Medical History, Surgical history, Social history, and Family History were reviewed and updated.  Review of Systems: All other 10 point review of systems is negative.   Physical Exam:  weight is 103 lb 1.9 oz (46.8 kg). Her oral temperature is 97.9 F (36.6 C). Her blood pressure is 127/70 and her pulse is 75. Her respiration is 17 and oxygen saturation is 100%.   Wt Readings from Last 3 Encounters:  03/20/23 103 lb 1.9 oz (46.8 kg)  03/04/23 100 lb (45.4 kg)  01/23/23 100 lb 13.9 oz (45.8 kg)    Ocular: Sclerae unicteric, pupils equal, round and reactive to light Ear-nose-throat: Oropharynx clear, dentition  fair Lymphatic: No cervical or supraclavicular adenopathy Lungs no rales or rhonchi, good excursion bilaterally Heart regular rate and rhythm, no murmur appreciated Abd soft, nontender, positive bowel sounds MSK no focal spinal tenderness, no joint edema Neuro: non-focal, well-oriented, appropriate affect Breasts: Deferred   Lab Results  Component Value Date   WBC 12.3 (H) 03/20/2023   HGB 13.1 03/20/2023   HCT 45.0 03/20/2023   MCV 76.1 (L) 03/20/2023   PLT 460 (H) 03/20/2023   Lab Results  Component Value Date   FERRITIN 22 02/26/2023   IRON 28 02/26/2023   TIBC 379 02/26/2023   UIBC 351 02/26/2023   IRONPCTSAT 7 (L) 02/26/2023   Lab Results  Component Value Date   RETICCTPCT 1.5  11/24/2022   RBC 5.91 (H) 03/20/2023   No results found for: "KPAFRELGTCHN", "LAMBDASER", "KAPLAMBRATIO" No results found for: "IGGSERUM", "IGA", "IGMSERUM" No results found for: "TOTALPROTELP", "ALBUMINELP", "A1GS", "A2GS", "BETS", "BETA2SER", "GAMS", "MSPIKE", "SPEI"   Chemistry      Component Value Date/Time   NA 135 03/20/2023 0936   NA 139 04/15/2021 1027   K 6.1 (H) 03/20/2023 0936   CL 100 03/20/2023 0936   CO2 31 03/20/2023 0936   BUN 10 03/20/2023 0936   BUN 14 04/15/2021 1027   CREATININE 0.91 03/20/2023 0936   CREATININE 0.84 07/14/2016 0914      Component Value Date/Time   CALCIUM 10.1 03/20/2023 0936   ALKPHOS 60 03/20/2023 0936   AST 20 03/20/2023 0936   ALT 12 03/20/2023 0936   BILITOT 0.5 03/20/2023 0936       Impression and Plan: Ms. Dallaire is a very pleasant 67 yo caucasian female with polycythemia vera, JAK 2 positive.  No phlebotomy today, Hct is 45%.  Patient has been having abdominal pain and also fatigue with IDA secondary to phlebotomy.  Lab only next week on Monday to recheck CMP. Lab and phlebotomy in 4 weeks, follow-up in 2 months.   Eileen Stanford, NP 7/12/202410:17 AM

## 2023-03-23 ENCOUNTER — Inpatient Hospital Stay: Payer: Medicare Other

## 2023-03-23 DIAGNOSIS — E875 Hyperkalemia: Secondary | ICD-10-CM

## 2023-03-23 DIAGNOSIS — D509 Iron deficiency anemia, unspecified: Secondary | ICD-10-CM | POA: Diagnosis not present

## 2023-03-23 DIAGNOSIS — D45 Polycythemia vera: Secondary | ICD-10-CM | POA: Diagnosis not present

## 2023-03-23 DIAGNOSIS — R109 Unspecified abdominal pain: Secondary | ICD-10-CM | POA: Diagnosis not present

## 2023-03-23 LAB — CMP (CANCER CENTER ONLY)
ALT: 13 U/L (ref 0–44)
AST: 23 U/L (ref 15–41)
Albumin: 4.4 g/dL (ref 3.5–5.0)
Alkaline Phosphatase: 59 U/L (ref 38–126)
Anion gap: 6 (ref 5–15)
BUN: 15 mg/dL (ref 8–23)
CO2: 31 mmol/L (ref 22–32)
Calcium: 9.9 mg/dL (ref 8.9–10.3)
Chloride: 99 mmol/L (ref 98–111)
Creatinine: 0.84 mg/dL (ref 0.44–1.00)
GFR, Estimated: >60 mL/min (ref >60)
Glucose, Bld: 98 mg/dL (ref 70–99)
Potassium: 4.1 mmol/L (ref 3.5–5.1)
Sodium: 136 mmol/L (ref 135–145)
Total Bilirubin: 0.6 mg/dL (ref 0.3–1.2)
Total Protein: 7.4 g/dL (ref 6.5–8.1)

## 2023-03-31 ENCOUNTER — Encounter: Payer: Self-pay | Admitting: Internal Medicine

## 2023-03-31 ENCOUNTER — Ambulatory Visit: Payer: Medicare Other | Admitting: Internal Medicine

## 2023-03-31 ENCOUNTER — Other Ambulatory Visit: Payer: Self-pay

## 2023-03-31 VITALS — BP 137/78 | HR 70 | Temp 98.0°F | Wt 100.2 lb

## 2023-03-31 DIAGNOSIS — A048 Other specified bacterial intestinal infections: Secondary | ICD-10-CM | POA: Diagnosis not present

## 2023-03-31 DIAGNOSIS — K219 Gastro-esophageal reflux disease without esophagitis: Secondary | ICD-10-CM | POA: Diagnosis not present

## 2023-03-31 DIAGNOSIS — K2950 Unspecified chronic gastritis without bleeding: Secondary | ICD-10-CM | POA: Diagnosis not present

## 2023-03-31 MED ORDER — AMOXICILLIN 500 MG PO TABS: 1000.0000 mg | ORAL_TABLET | Freq: Two times a day (BID) | ORAL | 0 refills | Status: DC

## 2023-03-31 MED ORDER — PROMETHAZINE HCL 12.5 MG RE SUPP: 12.5000 mg | Freq: Four times a day (QID) | RECTAL | 0 refills | Status: DC | PRN

## 2023-03-31 MED ORDER — CLARITHROMYCIN 500 MG PO TABS: 500.0000 mg | ORAL_TABLET | Freq: Two times a day (BID) | ORAL | 0 refills | Status: AC

## 2023-03-31 MED ORDER — AMOXICILLIN 500 MG PO TABS: 1000.0000 mg | ORAL_TABLET | Freq: Two times a day (BID) | ORAL | 0 refills | Status: AC

## 2023-03-31 NOTE — Progress Notes (Signed)
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 female cad s/p cabg, binge eating disorder, polycythemia vera, hx Mediterranean fever (dx'ed childhood, on chronic colchicine), constipation/ibs, internal hemorrhoid, s/p hysterectomy referred by GI for h pylori infection   She has been followed by gi since 2021 for constipation which currently she is taking Linzess. At some point reported reflex (started on omeprazole 40 mg daily) and brb per rectum. Underwent egd 10/09/22 for reflux, which showed sliding hh and mild gastritis with pathology showing hpylori  It appears GI had attempted quadruple therapy x2 but patient didn't tolerate  She also reports past pcn allergy and was referred to allergy clinic for evalution management of this  First tx 10/16/22 per gi note: 1) Omeprazole 20 mg 2 times a day x 14 d 2) Pepto Bismol 2 tabs (262 mg each) 4 times a day x 14 d 3) Metronidazole 250 mg 4 times a day x 14 d 4) doxycycline 100 mg 2 times a day x 14 d  I also saw levoflox prescribed after that 10/16/22 regimen and nitazoxanide. She also as gi side effect severely with this   She tried the abx pills above and she had so much nausea/vertigo to the point where she couldn't function     She had a stool antigen/diatherix test which showed positive test per GI report and that the clarithromycin  sensitivity was intact. I couldn't find the actual antigen test result   With regard to her pcn allergy. "I am not sure." She reports itching that she thought was due to polycythemia vera. She last took one of the penicillin vs cephalexin vs amoxicillin 2 years ago and had itch. However, she said she was itching with pretty much everything   Review of Systems: ROS All other ros negative   Meds: Colchicine  linzess Crestor Ezetimide Asa Omeprazole Benadryl as needed Macrobid after intercourse      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 52 years in [place], managed on colchicine   Osteoporosis 09/2016   Polycythemia vera (HCC)    PONV (postoperative nausea and vomiting)     Social History   Tobacco Use   Smoking status: Never   Smokeless tobacco: Never  Vaping Use   Vaping status: Never Used  Substance Use Topics   Alcohol use: Yes    Alcohol/week: 1.0 standard drink of alcohol    Types: 1 Glasses of wine per week    Comment: occassionally  Drug use: No    Comment: CBD gummies    Family History  Problem Relation Age of Onset   Heart disease Mother        died at 67 of heart attack   Hypertension Mother    Heart attack Mother    Heart disease Father        had heart attack at age 74   Heart attack Father    Rectal cancer Father 72   Hearing loss Father    Cancer Paternal Aunt    Stomach cancer Paternal Grandmother    Colon cancer Neg Hx    Colon polyps Neg Hx    Esophageal cancer Neg Hx     Allergies  Allergen Reactions   Amoxicillin     Had fever with flu like symptoms. Has patient had a PCN reaction causing immediate rash, facial/tongue/throat swelling, SOB or  lightheadedness with hypotension: no Has patient had a PCN reaction causing severe rash involving mucus membranes or skin necrosis: no Has patient had a PCN reaction that required hospitalization no Has patient had a PCN reaction occurring within the last 10 years: yes If all of the above answers are "NO", then may proceed with Cephalosporin use.   Clopidogrel & Aspirin Itching   Amlodipine Nausea Only    Dizzy, heavy feeling, tongue burning   Atorvastatin Other (See Comments)    Myalgia   Crestor [Rosuvastatin] Itching   Metoprolol Other (See Comments)    hair loss   Pravastatin Itching   Repatha [Evolocumab] Itching    OBJECTIVE: Vitals:   03/31/23 1438  Weight: 100 lb 3.2 oz (45.5 kg)   Body mass index is 17.75 kg/m.   Physical Exam General/constitutional: no distress, pleasant HEENT: Normocephalic, PER, Conj Clear, EOMI, Oropharynx clear Neck supple CV: rrr no mrg Lungs: clear to auscultation, normal respiratory effort Abd: Soft, Nontender Ext: no edema Skin: No Rash Neuro: nonfocal MSK: no peripheral joint swelling/tenderness/warmth; back spines nontender    Lab: Lab Results  Component Value Date   WBC 12.3 (H) 03/20/2023   HGB 13.1 03/20/2023   HCT 45.0 03/20/2023   MCV 76.1 (L) 03/20/2023   PLT 460 (H) 03/20/2023   Last metabolic panel Lab Results  Component Value Date   GLUCOSE 98 03/23/2023   NA 136 03/23/2023   K 4.1 03/23/2023   CL 99 03/23/2023   CO2 31 03/23/2023   BUN 15 03/23/2023   CREATININE 0.84 03/23/2023   GFRNONAA >60 03/23/2023   CALCIUM 9.9 03/23/2023   PROT 7.4 03/23/2023   ALBUMIN 4.4 03/23/2023   LABGLOB 2.9 06/28/2020   AGRATIO 1.5 06/28/2020   BILITOT 0.6 03/23/2023   ALKPHOS 59 03/23/2023   AST 23 03/23/2023   ALT 13 03/23/2023   ANIONGAP 6 03/23/2023    Microbiology:  Serology:  Imaging:   Assessment/plan: Problem List Items Addressed This Visit   None Visit Diagnoses     Gastroesophageal reflux disease,  unspecified whether esophagitis present    -  Primary   Chronic gastritis without bleeding, unspecified gastritis type       H. pylori infection       Relevant Medications   clarithromycin (BIAXIN) 500 MG tablet         Discuss complication and evaluation/treatment of h pylori and risk of drug resistance/tx failure  Reviewed her potential pcn allergy which doesn't seem to be the case  She really hasn't had treatment for hpylori due to severe gi side effect to doxy/flagyl  Will rx phenergan suppository to use prior to taking these abx  Plan triple based clarithromycin/amox/omeprazole regimen x14 days   Reviewed ddi. Advise reducing colchicine to 0.3 mg daily (baseline 0.6 daily to bid) and start 10 days into hpylori tx. Can resume full dose a week after finishing hpylori tx  Hold omeprazole at least 4 weeks after finishing hpylori treatment to retest         Follow-up: Return in about 6 weeks (around 05/12/2023).  Raymondo Band, MD [facility] 03/31/2023, 2:40 PM

## 2023-03-31 NOTE — Patient Instructions (Signed)
Let's start this 14 day treatment regimen   Amoxicillin 1000 mg twice a day (2 tabs in morning, 2 tabs in evening) Clarithromycin 500 mg twice a day Omeprazole 40 mg twice a day    While you are on clarithromycin, take colchicine 0.3 mg only once a day (half tab) starting 10 days into the h pylori treatment A week after you finish h pylori treatment, go back to full dose colchicine   Take phenergan suppository as needed for nausea/vomiting. Try one dose antibiotics and if you have side effect, then use phenergan 30 minute before the next dose   Once you are done with the 2 weeks of h pylori treatment, stop taking omeprazole for 3-4 weeks so we can recheck your h-pylori status   See me in 5-6 weeks for re test    If something comes up send me a mychart message

## 2023-04-02 ENCOUNTER — Encounter: Payer: Self-pay | Admitting: Internal Medicine

## 2023-04-10 ENCOUNTER — Other Ambulatory Visit: Payer: Self-pay | Admitting: Medical

## 2023-04-10 ENCOUNTER — Telehealth: Payer: Self-pay | Admitting: Medical

## 2023-04-10 NOTE — Telephone Encounter (Signed)
Pt called & stated that she was approved to see Dr. Drue Novel however, I did not see that approval documented. Please advise if okay to accept/ transfer pt.

## 2023-04-12 NOTE — Telephone Encounter (Signed)
Unable to take her as new pt

## 2023-04-15 ENCOUNTER — Encounter: Payer: Self-pay | Admitting: Internal Medicine

## 2023-04-15 ENCOUNTER — Other Ambulatory Visit: Payer: Self-pay

## 2023-04-15 ENCOUNTER — Ambulatory Visit (INDEPENDENT_AMBULATORY_CARE_PROVIDER_SITE_OTHER): Payer: Medicare Other | Admitting: Internal Medicine

## 2023-04-15 VITALS — BP 138/82 | HR 79 | Temp 97.9°F | Wt 100.8 lb

## 2023-04-15 DIAGNOSIS — A048 Other specified bacterial intestinal infections: Secondary | ICD-10-CM | POA: Diagnosis not present

## 2023-04-15 NOTE — Assessment & Plan Note (Signed)
I discussed at length different treatment options but she is not interested in any further attempts.  I discussed the risk of ulcers and certain types of cancers associated with it and discussed dietary modifications.   She will continue to follow up with GI She can come here as needed.    I have personally spent 30 minutes involved in face-to-face and non-face-to-face activities for this patient on the day of the visit. Professional time spent includes the following activities: Preparing to see the patient (review of tests), Obtaining and/or reviewing separately obtained history (admission/discharge record), Performing a medically appropriate examination and/or evaluation , Ordering medications/tests/procedures, referring and communicating with other health care professionals, Documenting clinical information in the EMR, Independently interpreting results (not separately reported), Communicating results to the patient/family/caregiver, Counseling and educating the patient/family/caregiver and Care coordination (not separately reported).

## 2023-04-15 NOTE — Telephone Encounter (Signed)
Called pt & LVM to inform her that Dr. Drue Novel is unable to accept her as a new pt.

## 2023-04-15 NOTE — Progress Notes (Signed)
   Subjective:    Patient ID: Katrina Walters, female    DOB: [birth year], 67 y.o.   MRN: [id number]  HPI Katrina Walters is here for a work in visit.   She is followed by GI and underwent endoscopy and positive for some gastritis and H pylori positive.  She was started on treatment by GI and stopped after a day, then tried levaquin to substitute and did not tolerate that and sent here.  She saw Dr. Renold Don and started on a different regimen and took again for 1 day and stopped due to side effects.  She had tried phenergan but did not help.  She noted nausea, dizziness, stomach upset.  She is not interested in further treatment.     Review of Systems  Constitutional:  Negative for fatigue.  Gastrointestinal:  Negative for diarrhea.  Skin:  Negative for rash.       Objective:   Physical Exam Eyes:     General: No scleral icterus. Neurological:     Mental Status: She is alert.   SH: no tobacco        Assessment & Plan:

## 2023-04-20 ENCOUNTER — Inpatient Hospital Stay: Payer: Medicare Other | Attending: Hematology & Oncology

## 2023-04-20 ENCOUNTER — Inpatient Hospital Stay: Payer: Medicare Other

## 2023-04-20 VITALS — BP 106/60 | HR 73 | Temp 98.5°F | Resp 18

## 2023-04-20 DIAGNOSIS — D45 Polycythemia vera: Secondary | ICD-10-CM | POA: Insufficient documentation

## 2023-04-20 DIAGNOSIS — D649 Anemia, unspecified: Secondary | ICD-10-CM

## 2023-04-20 DIAGNOSIS — D509 Iron deficiency anemia, unspecified: Secondary | ICD-10-CM | POA: Diagnosis not present

## 2023-04-20 LAB — CBC WITH DIFFERENTIAL (CANCER CENTER ONLY)
Abs Immature Granulocytes: 0.09 10*3/uL — ABNORMAL HIGH (ref 0.00–0.07)
Basophils Absolute: 0.1 10*3/uL (ref 0.0–0.1)
Basophils Relative: 1 %
Eosinophils Absolute: 0.3 10*3/uL (ref 0.0–0.5)
Eosinophils Relative: 2 %
HCT: 46.9 % — ABNORMAL HIGH (ref 36.0–46.0)
Hemoglobin: 13.4 g/dL (ref 12.0–15.0)
Immature Granulocytes: 1 %
Lymphocytes Relative: 15 %
Lymphs Abs: 1.9 10*3/uL (ref 0.7–4.0)
MCH: 20.6 pg — ABNORMAL LOW (ref 26.0–34.0)
MCHC: 28.6 g/dL — ABNORMAL LOW (ref 30.0–36.0)
MCV: 72 fL — ABNORMAL LOW (ref 80.0–100.0)
Monocytes Absolute: 0.7 10*3/uL (ref 0.1–1.0)
Monocytes Relative: 6 %
Neutro Abs: 9.2 10*3/uL — ABNORMAL HIGH (ref 1.7–7.7)
Neutrophils Relative %: 75 %
Platelet Count: 471 10*3/uL — ABNORMAL HIGH (ref 150–400)
RBC: 6.51 MIL/uL — ABNORMAL HIGH (ref 3.87–5.11)
RDW: 21.7 % — ABNORMAL HIGH (ref 11.5–15.5)
WBC Count: 12.3 10*3/uL — ABNORMAL HIGH (ref 4.0–10.5)
nRBC: 0 % (ref 0.0–0.2)

## 2023-04-20 LAB — CMP (CANCER CENTER ONLY)
ALT: 12 U/L (ref 0–44)
AST: 24 U/L (ref 15–41)
Albumin: 4.4 g/dL (ref 3.5–5.0)
Alkaline Phosphatase: 68 U/L (ref 38–126)
Anion gap: 7 (ref 5–15)
BUN: 14 mg/dL (ref 8–23)
CO2: 28 mmol/L (ref 22–32)
Calcium: 9.6 mg/dL (ref 8.9–10.3)
Chloride: 99 mmol/L (ref 98–111)
Creatinine: 0.82 mg/dL (ref 0.44–1.00)
GFR, Estimated: >60 mL/min (ref >60)
Glucose, Bld: 89 mg/dL (ref 70–99)
Potassium: 4.6 mmol/L (ref 3.5–5.1)
Sodium: 134 mmol/L — ABNORMAL LOW (ref 135–145)
Total Bilirubin: 0.5 mg/dL (ref 0.3–1.2)
Total Protein: 7.5 g/dL (ref 6.5–8.1)

## 2023-04-20 MED ORDER — SODIUM CHLORIDE 0.9 % IV SOLN: INTRAVENOUS | Status: DC

## 2023-04-20 NOTE — Progress Notes (Signed)
[name] presents today for phlebotomy per MD orders. Phlebotomy procedure started at 11:30 am  and ended at 12:50 pm. 490 grams removed. Patient observed for 30 minutes after procedure without any incident. Patient tolerated procedure well. Blood flow was sluggish. I flushed the IV and got great blood return. Changed out the phlebotomy bag.   Patient had drinks and snacks post phlebotomy. Patient received one hour of post IV hydration. VSS at discharge. 18G IV needle removed intact.

## 2023-04-20 NOTE — Patient Instructions (Signed)

## 2023-04-28 ENCOUNTER — Ambulatory Visit: Payer: Medicare Other | Admitting: Gastroenterology

## 2023-04-28 ENCOUNTER — Encounter: Payer: Self-pay | Admitting: Gastroenterology

## 2023-04-28 VITALS — BP 124/68 | Ht 63.0 in | Wt 100.0 lb

## 2023-04-28 DIAGNOSIS — K641 Second degree hemorrhoids: Secondary | ICD-10-CM

## 2023-04-28 DIAGNOSIS — K581 Irritable bowel syndrome with constipation: Secondary | ICD-10-CM

## 2023-04-28 NOTE — Progress Notes (Signed)
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  Previously trialed Dulcolax qod and increased hydration without much response, MiraLAX. - 02/2020: Abdominal x-ray with abundant stool burden.  Trialed Amitiza in 02/2020 with good efficacy but stopped due to upset stomach. - 03/2020: Started on Linzess with good clinical response, but reduced efficacy in early 2022.  (Interestingly, constipation started after hysterectomy in 2020.  Prior baseline was BM Q3d without straining) -10/2020: Sitz marker study: 10 markers remain with 9 in the transverse colon indicating slow colonic transit.  Started Trulance -11/2020: ARM: Decreased resting pressure with normal squeeze.  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  Referred to PT - 11/2020: Trulance was suboptimally efficacious.  Stopped and went back to Linzess 290 mcg -09/2022: GI follow-up appointment.  Completed pelvic floor PT and constipation well-controlled with Linzess with BM every 1-2 days without straining.  Was having BRB on tissue paper.  New issue was reflux, well-controlled with omeprazole 40 mg daily. - 10/09/2022: EGD: 1 cm sliding HH with Hill grade 3 valve, mild gastritis (path: H. pylori positive).  Normal duodenum (path benign)   History of internal hemorrhoids with intermittent scant BRBPR, now incompletely responsive to OTC therapy. - 12/23/2022: Banding of RA hemorrhoid - 03/04/2023: Banding of LL hemorrhoid   Family history notable for father with colon cancer in his 78s.   Endoscopic history: -Colonoscopy (08/17/2014, Dr. Clearence Cheek at [facility]): Single small polypoid polyp at splenic  flexure removed with snare (path: Unknown), Internal hemorrhoids. -Colonoscopy (03/2020, Dr. Barron Alvine): 2 rectal polyps (1 tubular adenoma), tortuous sigmoid colon, lipoma in the ascending colon, internal hemorrhoids.  Repeat in 7 years  HPI:     Patient is a 67 y.o. femalewith a history of symptomatic internal hemorrhoids presenting to the Gastroenterology Clinic for follow-up and ongoing treatment. The patient presents with symptomatic grade 2 hemorrhoids, unresponsive to maximal medical therapy, requesting rubber band ligation of symptomatic hemorrhoidal disease.  Has done well with each of the first 2 hemorrhoid bandings.  Very limited hemorrhoidal symptoms since initiating banding series.  Presents today for third banding.  Has been evaluated in the ID clinic for refractory H. pylori gastritis; she opted for no further antimicrobial therapy.  Continues to take Linzess daily and MiraLAX QOD for IBS-C, along with Benefiber QOD. Did have recent episode of sudden onset abdominal pain that improved with fiber, hydration, rest, and time.  Symptoms have not recurred.  No change in medical or surgical history, medications, allergies, social history since last appointment with me.   Review of systems:     No chest pain, no SOB, no fevers, no urinary sx   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 46 years in [place], managed on colchicine   Osteoporosis 09/2016   Polycythemia vera (HCC)    PONV (postoperative nausea and vomiting)     Patient's  surgical history, family medical history, social history, medications and allergies were all reviewed in Epic    Current Outpatient Medications  Medication Sig Dispense Refill    aspirin 81 MG chewable tablet Chew by mouth daily.     Bismuth 262 MG CHEW Chew 524 mg by mouth in the morning, at noon, in the evening, and at bedtime. 112 tablet 0   colchicine 0.6 MG tablet Take 1 tablet (0.6 mg total) by mouth 2 (two) times daily. 180 tablet 3   Cranberry-Vitamin C 250-60 MG CAPS Take 2 tablets by mouth daily.     D-MANNOSE PO Take 2 tablets by mouth daily.     denosumab (PROLIA) 60 MG/ML SOSY injection Inject 60 mg into the skin every 6 (six) months. Pt due 02/06/23 1 mL 0   diclofenac (VOLTAREN) 75 MG EC tablet Take 1 tablet (75 mg total) by mouth 2 (two) times daily. 20 tablet 0   diphenhydrAMINE (BENADRYL) 25 MG tablet Take 25 mg by mouth at bedtime.     ezetimibe (ZETIA) 10 MG tablet Take 1 tablet (10 mg total) by mouth daily. Please keep scheduled appointment for additional refills. 90 tablet 3   FIBER PO Take 2 tablets by mouth daily.     linaclotide (LINZESS) 290 MCG CAPS capsule Take 1 capsule (290 mcg total) by mouth daily before breakfast. 90 capsule 5   nitazoxanide (ALINIA) 500 MG tablet Take 1 tablet (500 mg total) by mouth 2 (two) times daily with a meal. 20 tablet 0   nitrofurantoin, macrocrystal-monohydrate, (MACROBID) 100 MG capsule Take 1 capsule (100 mg total) by mouth at bedtime as needed (after intercourse). 90 capsule 3   NON FORMULARY Take 0.5 tablets by mouth at bedtime. CBD oil - for pain Gummies     omeprazole (PRILOSEC) 40 MG capsule Take 1 capsule (40 mg total) by mouth daily. 30 capsule 0   ondansetron (ZOFRAN) 4 MG tablet Take 1 tablet (4 mg total) by mouth every 8 (eight) hours as needed for nausea or vomiting. 30 tablet 1   Polyethyl Glycol-Propyl Glycol (SYSTANE OP) Place 1 drop into both eyes in the morning and at bedtime.     PREMARIN vaginal cream PLACE 0.25 APPLICATORFULS VAGINALLY SEE ADMIN INSTRUCTIONS. INSERT 0.5 GRAMS VAGINALLY EVERY OTHER NIGHT 30 g 12   rosuvastatin (CRESTOR) 10 MG tablet Take 1 tablet (10 mg total) by mouth daily. 90  tablet 3   Wheat Dextrin (BENEFIBER DRINK MIX PO) Take by mouth daily at 6 (six) AM.     No current facility-administered medications for this visit.    Physical Exam:     BP 124/68   Ht 5\' 3"  (1.6 m)   Wt 100 lb (45.4 kg)   LMP  (LMP Unknown)   BMI 17.71 kg/m   GENERAL:  Pleasant female in NAD PSYCH: : Cooperative, normal affect NEURO: Alert and oriented x 3, no focal neurologic deficits Rectal exam: External skin tag.  Sensation intact and preserved anal wink.  Grade 2 hemorrhoids noted in RP position on exam.  No external anal fissures noted. Normal sphincter tone. No palpable mass. No blood on the exam glove. (Chaperone: Ailene Rud, CMA).   IMPRESSION and PLAN:    1)  Symptomatic internal hemorrhoids: PROCEDURE NOTE: The patient presents with symptomatic grade 2 hemorrhoids, unresponsive to maximal medical therapy, requesting rubber band ligation of symptomatic hemorrhoidal disease.  All risks, benefits and alternative forms of therapy were described and informed consent was obtained.  In the  Left Lateral Decubitus position, anoscopic examination revealed grade 2 hemorrhoids in the RP position(s).  The anorectum was pre-medicated with RectiCare. The decision was made to band the RP internal hemorrhoid, and the [facility] System was used to perform band ligation without complication.  Digital anorectal examination was then performed to assure proper positioning of the band, and to adjust the banded tissue as required.  The patient was discharged home without pain or other issues.  Dietary and behavioral recommendations were given and along with follow-up instructions.     The following adjunctive treatments were recommended:  -Resume high-fiber diet with fiber supplement (i.e. Citrucel or Benefiber) with goal for soft stools without straining to have a BM. -Resume adequate fluid intake.  The patient will return as needed for follow-up and possible additional banding as  required. No complications were encountered and the patient tolerated the procedure well.    2) IBS-C - Continue Linzess, MiraLAX, Benefiber  3) Heartburn 4) GERD - Well-controlled on omeprazole  5) History of colon polyps - Repeat colonoscopy in 2028 for ongoing polyp surveillance  RTC as needed  I spent an additional 20 minutes of nonprocedural time, including independent review of results as outlined above, communicating results with the patient directly, face-to-face time with the patient, coordinating care, ordering studies and medications as appropriate, and documentation.         Shellia Cleverly ,DO, FACG 04/28/2023, 10:42 AM

## 2023-04-28 NOTE — Patient Instructions (Addendum)
   HEMORRHOID BANDING PROCEDURE    FOLLOW-UP CARE   The procedure you have had should have been relatively painless since the banding of the area involved does not have nerve endings and there is no pain sensation.  The rubber band cuts off the blood supply to the hemorrhoid and the band may fall off as soon as 48 hours after the banding (the band may occasionally be seen in the toilet bowl following a bowel movement). You may notice a temporary feeling of fullness in the rectum which should respond adequately to plain Tylenol or Motrin.  Following the banding, avoid strenuous exercise that evening and resume full activity the next day.  A sitz bath (soaking in a warm tub) or bidet is soothing, and can be useful for cleansing the area after bowel movements.     To avoid constipation, take two tablespoons of natural wheat bran, natural oat bran, flax, Benefiber or any over the counter fiber supplement and increase your water intake to 7-8 glasses daily.    Unless you have been prescribed anorectal medication, do not put anything inside your rectum for two weeks: No suppositories, enemas, fingers, etc.  Occasionally, you may have more bleeding than usual after the banding procedure.  This is often from the untreated hemorrhoids rather than the treated one.  Don't be concerned if there is a tablespoon or so of blood.  If there is more blood than this, lie flat with your bottom higher than your head and apply an ice pack to the area. If the bleeding does not stop within a half an hour or if you feel faint, call our office at [phone] or go to the emergency room.  Problems are not common; however, if there is a substantial amount of bleeding, severe pain, chills, fever or difficulty passing urine (very rare) or other problems, you should call us at [phone] or report to the nearest emergency room.  Do not stay seated continuously for more than 2-3 hours for a day or two after the  procedure.  Tighten your buttock muscles 10-15 times every two hours and take 10-15 deep breaths every 1-2 hours.  Do not spend more than a few minutes on the toilet if you cannot empty your bowel; instead re-visit the toilet at a later time.    Due to recent changes in healthcare laws, you may see the results of your imaging and laboratory studies on MyChart before your provider has had a chance to review them.  We understand that in some cases there may be results that are confusing or concerning to you. Not all laboratory results come back in the same time frame and the provider may be waiting for multiple results in order to interpret others.  Please give [place] 48 hours in order for your provider to thoroughly review all the results before contacting the office for clarification of your results.   Follow up as needed.   Thank you for choosing me and [facility].  Vito Cirigliano, D.O.

## 2023-05-14 ENCOUNTER — Ambulatory Visit (INDEPENDENT_AMBULATORY_CARE_PROVIDER_SITE_OTHER): Payer: Medicare Other | Admitting: Medical

## 2023-05-14 ENCOUNTER — Encounter: Payer: Self-pay | Admitting: Medical

## 2023-05-14 ENCOUNTER — Other Ambulatory Visit (HOSPITAL_BASED_OUTPATIENT_CLINIC_OR_DEPARTMENT_OTHER): Payer: Self-pay

## 2023-05-14 ENCOUNTER — Ambulatory Visit: Payer: Medicare Other | Admitting: Internal Medicine

## 2023-05-14 VITALS — BP 121/64 | HR 79 | Temp 98.0°F | Resp 16 | Ht 63.0 in | Wt 99.0 lb

## 2023-05-14 DIAGNOSIS — R739 Hyperglycemia, unspecified: Secondary | ICD-10-CM

## 2023-05-14 DIAGNOSIS — K219 Gastro-esophageal reflux disease without esophagitis: Secondary | ICD-10-CM

## 2023-05-14 DIAGNOSIS — M5440 Lumbago with sciatica, unspecified side: Secondary | ICD-10-CM | POA: Diagnosis not present

## 2023-05-14 DIAGNOSIS — E875 Hyperkalemia: Secondary | ICD-10-CM | POA: Diagnosis not present

## 2023-05-14 DIAGNOSIS — R0989 Other specified symptoms and signs involving the circulatory and respiratory systems: Secondary | ICD-10-CM | POA: Diagnosis not present

## 2023-05-14 DIAGNOSIS — R0982 Postnasal drip: Secondary | ICD-10-CM | POA: Diagnosis not present

## 2023-05-14 DIAGNOSIS — E785 Hyperlipidemia, unspecified: Secondary | ICD-10-CM | POA: Diagnosis not present

## 2023-05-14 DIAGNOSIS — A048 Other specified bacterial intestinal infections: Secondary | ICD-10-CM | POA: Diagnosis not present

## 2023-05-14 DIAGNOSIS — J302 Other seasonal allergic rhinitis: Secondary | ICD-10-CM | POA: Diagnosis not present

## 2023-05-14 LAB — LIPID PANEL
Cholesterol: 100 mg/dL (ref 0–200)
HDL: 34.4 mg/dL — ABNORMAL LOW (ref >39.00)
LDL Cholesterol: 45 mg/dL (ref 0–99)
NonHDL: 65.98
Total CHOL/HDL Ratio: 3
Triglycerides: 107 mg/dL (ref 0.0–149.0)
VLDL: 21.4 mg/dL (ref 0.0–40.0)

## 2023-05-14 LAB — COMPREHENSIVE METABOLIC PANEL
ALT: 13 U/L (ref 0–35)
AST: 18 U/L (ref 0–37)
Albumin: 4.1 g/dL (ref 3.5–5.2)
Alkaline Phosphatase: 64 U/L (ref 39–117)
BUN: 10 mg/dL (ref 6–23)
CO2: 31 meq/L (ref 19–32)
Calcium: 9.8 mg/dL (ref 8.4–10.5)
Chloride: 101 meq/L (ref 96–112)
Creatinine, Ser: 0.77 mg/dL (ref 0.40–1.20)
GFR: 80.49 mL/min (ref >60.00)
Glucose, Bld: 77 mg/dL (ref 70–99)
Potassium: 5.3 meq/L — ABNORMAL HIGH (ref 3.5–5.1)
Sodium: 139 meq/L (ref 135–145)
Total Bilirubin: 0.6 mg/dL (ref 0.2–1.2)
Total Protein: 7.2 g/dL (ref 6.0–8.3)

## 2023-05-14 LAB — HEMOGLOBIN A1C: Hgb A1c MFr Bld: 6 % (ref 4.6–6.5)

## 2023-05-14 MED ORDER — DICLOFENAC SODIUM 75 MG PO TBEC: 75.0000 mg | DELAYED_RELEASE_TABLET | Freq: Two times a day (BID) | ORAL | 0 refills | Status: DC

## 2023-05-14 MED ORDER — LEVOCETIRIZINE DIHYDROCHLORIDE 5 MG PO TABS: 5.0000 mg | ORAL_TABLET | Freq: Every evening | ORAL | 3 refills | Status: DC

## 2023-05-14 MED ORDER — SODIUM POLYSTYRENE SULFONATE 15 GM/60ML PO SUSP
ORAL | 0 refills | Status: DC
  Filled 2023-05-14 – 2023-05-15 (×2): qty 240, 4d supply, fill #0

## 2023-05-14 MED ORDER — COLCHICINE 0.6 MG PO TABS: 0.6000 mg | ORAL_TABLET | Freq: Two times a day (BID) | ORAL | 3 refills | Status: DC

## 2023-05-14 NOTE — Addendum Note (Signed)
Addended by: Gwenevere Abbot on: 05/14/2023 09:35 PM   Modules accepted: Orders

## 2023-05-14 NOTE — Progress Notes (Signed)
Subjective:    Patient ID: Katrina Walters, female    DOB: 1956/07/31, 67 y.o.   MRN: [id number]  HPI Discussed the use of AI scribe software for clinical note transcription with the patient, who gave verbal consent to proceed.  History of Present Illness   The patient, with a history of polycythemia vera, degenerative disc disease, and H. pylori infection, presents with chronic post-nasal drip and phlegm accumulation in the throat for the past two years. She describes a sensation of sinus drainage that gets 'stuck' in the throat, leading to a need to cough to clear it. This occurs year-round and is associated with a history of seasonal allergies.  The patient also reports chronic low back pain, which she attributes to her known degenerative disc disease. The pain occasionally radiates to the left leg, causing noticeable weakness. She manages flare-ups with diclofenac and Tylenol, using the diclofenac sparingly due to a history of intolerance to high-dose medications.  The patient has a history of H. pylori infection, which has been difficult to treat due to intolerance to high-dose antibiotics. She has failed four regimens of antibiotics due to severe side effects that leave her bedridden. She is currently on omeprazole for symptom management.  The patient also has a history of polycythemia vera, for which she is under the care of a hematologist. She undergoes phlebotomy every other month to manage her condition. She reports fatigue, which she attributes to her polycythemia vera.        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 59 years in [place], managed on colchicine   Osteoporosis 09/2016   Polycythemia vera (HCC)    PONV  (postoperative nausea and vomiting)      Social History   Socioeconomic History   Marital status: Married    Spouse name: Not on file   Number of children: 1   Years of education: Not on file   Highest education level: Not on file  Occupational History   Occupation: unemployed at the time  Tobacco Use   Smoking status: Never   Smokeless tobacco: Never  Vaping Use   Vaping status: Never Used  Substance and Sexual Activity   Alcohol use: Yes    Alcohol/week: 1.0 standard drink of alcohol    Types: 1 Glasses of wine per week    Comment: occassionally   Drug use: No    Comment: CBD gummies   Sexual activity: Yes  Other Topics Concern   Not on file  Social History Narrative   Not on file   Social Determinants of Health   Financial Resource Strain: Medium Risk (02/22/2023)   Overall Financial Resource Strain (CARDIA)    Difficulty of Paying Living Expenses: Somewhat hard  Food Insecurity: Food Insecurity Present (02/22/2023)   Hunger Vital Sign    Worried About Running Out of Food in the Last Year: Sometimes true    Ran Out of Food in the Last Year: Never true  Transportation Needs: No Transportation Needs (02/22/2023)   PRAPARE - Administrator, Civil Service (Medical): No    Lack of Transportation (Non-Medical): No  Physical Activity:  Insufficiently Active (02/22/2023)   Exercise Vital Sign    Days of Exercise per Week: 3 days    Minutes of Exercise per Session: 10 min  Stress: No Stress Concern Present (02/22/2023)   [employer] of Occupational Health - Occupational Stress Questionnaire    Feeling of Stress : Not at all  Social Connections: Unknown (02/22/2023)   Social Connection and Isolation Panel [NHANES]    Frequency of Communication with Friends and Family: More than three times a week    Frequency of Social Gatherings with Friends and Family: Once a week    Attends Religious Services: Not on file    Active Member of Clubs or Organizations: No     Attends Banker Meetings: Never    Marital Status: Married  Catering manager Violence: Not At Risk (02/23/2023)   Humiliation, Afraid, Rape, and Kick questionnaire    Fear of Current or Ex-Partner: No    Emotionally Abused: No    Physically Abused: No    Sexually Abused: No    Past Surgical History:  Procedure Laterality Date   ABDOMINAL HYSTERECTOMY  08/2019   ANAL RECTAL MANOMETRY N/A 11/12/2020   Procedure: ANO RECTAL MANOMETRY;  Surgeon: Shellia Cleverly, DO;  Location: [facility];  Service: Gastroenterology;  Laterality: N/A;   BLADDER SURGERY  08/2019   BREAST REDUCTION SURGERY  2006   CARDIAC CATHETERIZATION N/A 07/17/2016   Procedure: Left Heart Cath and Coronary Angiography;  Surgeon: Kathleene Hazel, MD;  Location: [facility] INVASIVE CV LAB;  Service: Cardiovascular;  Laterality: N/A;   CATARACT EXTRACTION W/ INTRAOCULAR LENS IMPLANT Bilateral    COLONOSCOPY  2015   CORONARY ARTERY BYPASS GRAFT N/A 06/04/2021   Procedure: CORONARY ARTERY BYPASS GRAFTING (CABG), ON PUMP, TIMES FOUR, USING LEFT INTERNAL MAMMARY ARTERY, LEFT RADIAL ARTERY (HARVESTED OPEN), AND RIGHT GREATER SAPHENOUS VEIN (HARVESTED ENDOSCOPICALLY);  Surgeon: Corliss Skains, MD;  Location: [facility] OR;  Service: Open Heart Surgery;  Laterality: N/A;   EYE SURGERY     LEFT HEART CATH AND CORONARY ANGIOGRAPHY N/A 04/22/2021   Procedure: LEFT HEART CATH AND CORONARY ANGIOGRAPHY;  Surgeon: Lyn Records, MD;  Location: [facility] INVASIVE CV LAB;  Service: Cardiovascular;  Laterality: N/A;   POLYPECTOMY  2015   RADIAL ARTERY HARVEST Left 06/04/2021   Procedure: RADIAL ARTERY HARVEST;  Surgeon: Corliss Skains, MD;  Location: [facility] OR;  Service: Open Heart Surgery;  Laterality: Left;   TEE WITHOUT CARDIOVERSION N/A 06/04/2021   Procedure: TRANSESOPHAGEAL ECHOCARDIOGRAM (TEE);  Surgeon: Corliss Skains, MD;  Location: [facility] OR;  Service: Open Heart Surgery;  Laterality: N/A;   TONSILLECTOMY      Family  History  Problem Relation Age of Onset   Heart disease Mother        died at 36 of heart attack   Hypertension Mother    Heart attack Mother    Heart disease Father        had heart attack at age 74   Heart attack Father    Rectal cancer Father 38   Hearing loss Father    Cancer Paternal Aunt    Stomach cancer Paternal Grandmother    Colon cancer Neg Hx    Colon polyps Neg Hx    Esophageal cancer Neg Hx     Allergies  Allergen Reactions   Amoxicillin     Had fever with flu like symptoms. Has patient had a PCN reaction causing immediate rash, facial/tongue/throat swelling, SOB or lightheadedness with hypotension:  no Has patient had a PCN reaction causing severe rash involving mucus membranes or skin necrosis: no Has patient had a PCN reaction that required hospitalization no Has patient had a PCN reaction occurring within the last 10 years: yes If all of the above answers are "NO", then may proceed with Cephalosporin use.   Clopidogrel & Aspirin Itching   Amlodipine Nausea Only    Dizzy, heavy feeling, tongue burning   Atorvastatin Other (See Comments)    Myalgia   Crestor [Rosuvastatin] Itching   Metoprolol Other (See Comments)    hair loss   Pravastatin Itching   Repatha [Evolocumab] Itching    Current Outpatient Medications on File Prior to Visit  Medication Sig Dispense Refill   aspirin 81 MG chewable tablet Chew by mouth daily.     Bismuth 262 MG CHEW Chew 524 mg by mouth in the morning, at noon, in the evening, and at bedtime. 112 tablet 0   colchicine 0.6 MG tablet Take 1 tablet (0.6 mg total) by mouth 2 (two) times daily. 180 tablet 3   Cranberry-Vitamin C 250-60 MG CAPS Take 2 tablets by mouth daily.     D-MANNOSE PO Take 2 tablets by mouth daily.     denosumab (PROLIA) 60 MG/ML SOSY injection Inject 60 mg into the skin every 6 (six) months. Pt due 02/06/23 1 mL 0   diclofenac (VOLTAREN) 75 MG EC tablet Take 1 tablet (75 mg total) by mouth 2 (two) times daily. 20  tablet 0   diphenhydrAMINE (BENADRYL) 25 MG tablet Take 25 mg by mouth at bedtime.     ezetimibe (ZETIA) 10 MG tablet Take 1 tablet (10 mg total) by mouth daily. Please keep scheduled appointment for additional refills. 90 tablet 3   FIBER PO Take 2 tablets by mouth daily.     linaclotide (LINZESS) 290 MCG CAPS capsule Take 1 capsule (290 mcg total) by mouth daily before breakfast. 90 capsule 5   nitazoxanide (ALINIA) 500 MG tablet Take 1 tablet (500 mg total) by mouth 2 (two) times daily with a meal. 20 tablet 0   nitrofurantoin, macrocrystal-monohydrate, (MACROBID) 100 MG capsule Take 1 capsule (100 mg total) by mouth at bedtime as needed (after intercourse). 90 capsule 3   NON FORMULARY Take 0.5 tablets by mouth at bedtime. CBD oil - for pain Gummies     omeprazole (PRILOSEC) 40 MG capsule Take 1 capsule (40 mg total) by mouth daily. 30 capsule 0   ondansetron (ZOFRAN) 4 MG tablet Take 1 tablet (4 mg total) by mouth every 8 (eight) hours as needed for nausea or vomiting. 30 tablet 1   Polyethyl Glycol-Propyl Glycol (SYSTANE OP) Place 1 drop into both eyes in the morning and at bedtime.     PREMARIN vaginal cream PLACE 0.25 APPLICATORFULS VAGINALLY SEE ADMIN INSTRUCTIONS. INSERT 0.5 GRAMS VAGINALLY EVERY OTHER NIGHT 30 g 12   rosuvastatin (CRESTOR) 10 MG tablet Take 1 tablet (10 mg total) by mouth daily. 90 tablet 3   Wheat Dextrin (BENEFIBER DRINK MIX PO) Take by mouth daily at 6 (six) AM.     No current facility-administered medications on file prior to visit.    BP 121/64 (BP Location: Right Arm, Patient Position: Sitting, Cuff Size: Small)   Pulse 79   Temp 98 F (36.7 C) (Oral)   Resp 16   Ht 5\' 3"  (1.6 m)   Wt 99 lb (44.9 kg)   LMP  (LMP Unknown)   SpO2 100%   BMI 17.54  kg/m     Review of Systems See hpi    Objective:   Physical Exam  General Mental Status- Alert. General Appearance- Not in acute distress.   Skin General: Color- Normal Color. Moisture- Normal  Moisture.  Neck Carotid Arteries- Normal color. Moisture- Normal Moisture. No carotid bruits. No JVD.  Chest and Lung Exam Auscultation: Breath Sounds:-Normal.  Cardiovascular Auscultation:Rythm- Regular. Murmurs & Other Heart Sounds:Auscultation of the heart reveals- No Murmurs.  Abdomen Inspection:-Inspeection Normal. Palpation/Percussion:Note:No mass. Palpation and Percussion of the abdomen reveal- Non Tender, Non Distended + BS, no rebound or guarding.   Neurologic Cranial Nerve exam:- CN III-XII intact(No nystagmus), symmetric smile. Strength:- 5/5 equal and symmetric strength both upper and lower extremities.   Heent- +pnd. Boggy turbinates. No sinus pressure today    Assessment & Plan:   Assessment and Plan    Postnasal Drip Chronic symptoms with seasonal exacerbations. Likely secondary to allergies. -Prescribe Xyzal to be taken at night to decrease drainage.  Chronic Low Back Pain History of degenerative disc disease and scoliosis. Pain occasionally radiates to left leg. Uses Diclofenac sparingly for flare-ups. -Continue Diclofenac as needed for pain, with extensive precautionary advisement due to history of H. pylori and potential for gastric ulcer development.(Pt request this as has used before and acknowledge risk vs benefit) -if abd pain with diclofenac use sparingly stop immediately. If any black or bloody stool notify me immediatley   H. pylori Infection Failed multiple antibiotic regimens due to intolerance. Currently managed with Omeprazole. -Refer to [facility] for potential alternative treatment options.  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 -Continue current management and follow-up with hematologist.  General Health Maintenance -Continue daily baby aspirin 81mg . -Continue Omeprazole for gastric protection. -Follow-up after referral appointments. -lipid panel and a1c. (high cholesterol and elevated sugar)   Follow up date  to be determined after lab review.   Esperanza Richters, PA-C

## 2023-05-14 NOTE — Patient Instructions (Addendum)
Postnasal Drip Chronic symptoms with seasonal exacerbations. Likely secondary to allergies. -Prescribe Xyzal to be taken at night to decrease drainage.  Chronic Low Back Pain History of degenerative disc disease and scoliosis. Pain occasionally radiates to left leg. Uses Diclofenac sparingly for flare-ups.(Tylenol first as discussed) -Continue Diclofenac as needed for pain, with extensive precautionary advisement due to history of H. pylori and potential for gastric ulcer development.(Pt request this as has used before and acknowledge risk vs benefit) -if abd pain with diclofenac use sparingly stop immediately. If any black or bloody stool notify me immediatley   H. pylori Infection Failed multiple antibiotic regimens due to intolerance. Currently managed with Omeprazole. -Refer to [facility] for potential alternative treatment options.  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 -Continue current management and follow-up with hematologist.  General Health Maintenance -Continue daily baby aspirin 81mg . -Continue Omeprazole for gastric protection. -Follow-up after referral appointments. -lipid panel and a1c. (high cholesterol and elevated sugar)   Follow up date to be determined after lab review.

## 2023-05-14 NOTE — Addendum Note (Signed)
Addended by: Gwenevere Abbot on: 05/14/2023 09:33 PM   Modules accepted: Orders

## 2023-05-15 ENCOUNTER — Other Ambulatory Visit (HOSPITAL_BASED_OUTPATIENT_CLINIC_OR_DEPARTMENT_OTHER): Payer: Self-pay

## 2023-05-15 ENCOUNTER — Inpatient Hospital Stay: Payer: Medicare Other

## 2023-05-15 ENCOUNTER — Encounter: Payer: Self-pay | Admitting: Oncology

## 2023-05-15 ENCOUNTER — Inpatient Hospital Stay: Payer: Medicare Other | Attending: Hematology & Oncology | Admitting: Oncology

## 2023-05-15 VITALS — BP 126/72 | HR 80 | Temp 98.3°F | Resp 18 | Wt 102.0 lb

## 2023-05-15 DIAGNOSIS — D509 Iron deficiency anemia, unspecified: Secondary | ICD-10-CM

## 2023-05-15 DIAGNOSIS — D45 Polycythemia vera: Secondary | ICD-10-CM

## 2023-05-15 DIAGNOSIS — Z7982 Long term (current) use of aspirin: Secondary | ICD-10-CM | POA: Insufficient documentation

## 2023-05-15 LAB — CMP (CANCER CENTER ONLY)
ALT: 11 U/L (ref 0–44)
AST: 20 U/L (ref 15–41)
Albumin: 4.4 g/dL (ref 3.5–5.0)
Alkaline Phosphatase: 65 U/L (ref 38–126)
Anion gap: 6 (ref 5–15)
BUN: 11 mg/dL (ref 8–23)
CO2: 29 mmol/L (ref 22–32)
Calcium: 9.9 mg/dL (ref 8.9–10.3)
Chloride: 97 mmol/L — ABNORMAL LOW (ref 98–111)
Creatinine: 0.86 mg/dL (ref 0.44–1.00)
GFR, Estimated: >60 mL/min (ref >60)
Glucose, Bld: 96 mg/dL (ref 70–99)
Potassium: 5.3 mmol/L — ABNORMAL HIGH (ref 3.5–5.1)
Sodium: 132 mmol/L — ABNORMAL LOW (ref 135–145)
Total Bilirubin: 0.6 mg/dL (ref 0.3–1.2)
Total Protein: 7.4 g/dL (ref 6.5–8.1)

## 2023-05-15 LAB — CBC WITH DIFFERENTIAL (CANCER CENTER ONLY)
Abs Immature Granulocytes: 0.06 10*3/uL (ref 0.00–0.07)
Basophils Absolute: 0.1 10*3/uL (ref 0.0–0.1)
Basophils Relative: 1 %
Eosinophils Absolute: 0.3 10*3/uL (ref 0.0–0.5)
Eosinophils Relative: 2 %
HCT: 42 % (ref 36.0–46.0)
Hemoglobin: 12 g/dL (ref 12.0–15.0)
Immature Granulocytes: 0 %
Lymphocytes Relative: 14 %
Lymphs Abs: 2 10*3/uL (ref 0.7–4.0)
MCH: 19.4 pg — ABNORMAL LOW (ref 26.0–34.0)
MCHC: 28.6 g/dL — ABNORMAL LOW (ref 30.0–36.0)
MCV: 67.7 fL — ABNORMAL LOW (ref 80.0–100.0)
Monocytes Absolute: 0.8 10*3/uL (ref 0.1–1.0)
Monocytes Relative: 5 %
Neutro Abs: 10.9 10*3/uL — ABNORMAL HIGH (ref 1.7–7.7)
Neutrophils Relative %: 78 %
Platelet Count: 492 10*3/uL — ABNORMAL HIGH (ref 150–400)
RBC: 6.2 MIL/uL — ABNORMAL HIGH (ref 3.87–5.11)
RDW: 22.5 % — ABNORMAL HIGH (ref 11.5–15.5)
WBC Count: 14.2 10*3/uL — ABNORMAL HIGH (ref 4.0–10.5)
nRBC: 0 % (ref 0.0–0.2)

## 2023-05-15 LAB — IRON AND IRON BINDING CAPACITY (CC-WL,HP ONLY)
Iron: 20 ug/dL — ABNORMAL LOW (ref 28–170)
Saturation Ratios: 5 % — ABNORMAL LOW (ref 10.4–31.8)
TIBC: 438 ug/dL (ref 250–450)
UIBC: 418 ug/dL (ref 148–442)

## 2023-05-15 LAB — FERRITIN: Ferritin: 6 ng/mL — ABNORMAL LOW (ref 11–307)

## 2023-05-15 NOTE — Progress Notes (Signed)
Hematology and Oncology Follow Up Visit  Katrina Walters [id number] 1956/07/17 67 y.o. 05/15/2023   Principle Diagnosis:  Polycythemia Vera -- JAK2 (+)   Current Therapy:        Phlebotomy to maintain HCT below 45% Aspirin 81 mg PO daily               Interim History:  Katrina Walters is here today with her husband for follow-up.  She reports fatigue but is still able to do some work around the house.  She has been diagnosed with H. pylori but cannot tolerate the antibiotics to treat this.  She is awaiting an appointment with [facility] to discuss treatment options.  She reports occasional shortness of breath but no chest pain.  Denies nausea and vomiting.  Weight is overall stable.  No bleeding reported.  Katrina Walters.  She has questions regarding her diet.  ECOG Performance Status: 1 - Symptomatic but completely ambulatory  Medications:  Allergies as of 05/15/2023       Reactions   Amoxicillin    Had fever with flu like symptoms. Has patient had a PCN reaction causing immediate rash, facial/tongue/throat swelling, SOB or lightheadedness with hypotension: no Has patient had a PCN reaction causing severe rash involving mucus membranes or skin necrosis: no Has patient had a PCN reaction that required hospitalization no Has patient had a PCN reaction occurring within the last 10 years: yes If all of the above answers are "NO", then may proceed with Cephalosporin use.   Clopidogrel & Aspirin Itching   Amlodipine Nausea Only   Dizzy, heavy feeling, tongue burning   Atorvastatin Other (See Comments)   Myalgia   Crestor [rosuvastatin] Itching   Metoprolol Other (See Comments)   hair loss   Pravastatin Itching   Repatha [evolocumab] Itching        Medication List        Accurate as of May 15, 2023 11:02 AM. If you have any questions, ask your nurse or doctor.          aspirin 81 MG chewable tablet Chew by mouth daily.   BENEFIBER  DRINK MIX PO Take by mouth daily at 6 (six) AM.   Bismuth 262 MG Chew Chew 524 mg by mouth in the morning, at noon, in the evening, and at bedtime.   colchicine 0.6 MG tablet Take 1 tablet (0.6 mg total) by mouth 2 (two) times daily.   Cranberry-Vitamin C 250-60 MG Caps Take 2 tablets by mouth daily.   D-MANNOSE PO Take 2 tablets by mouth daily.   diclofenac 75 MG EC tablet Commonly known as: VOLTAREN Take 1 tablet (75 mg total) by mouth 2 (two) times daily.   diclofenac 75 MG EC tablet Commonly known as: VOLTAREN Take 1 tablet (75 mg total) by mouth 2 (two) times daily.   diphenhydrAMINE 25 MG tablet Commonly known as: BENADRYL Take 25 mg by mouth at bedtime.   ezetimibe 10 MG tablet Commonly known as: ZETIA Take 1 tablet (10 mg total) by mouth daily. Please keep scheduled appointment for additional refills.   FIBER PO Take 2 tablets by mouth daily.   levocetirizine 5 MG tablet Commonly known as: XYZAL Take 1 tablet (5 mg total) by mouth every evening.   linaclotide 290 MCG Caps capsule Commonly known as: LINZESS Take 1 capsule (290 mcg total) by mouth daily before breakfast.   nitazoxanide 500 MG tablet Commonly known as: ALINIA Take  1 tablet (500 mg total) by mouth 2 (two) times daily with a meal.   nitrofurantoin (macrocrystal-monohydrate) 100 MG capsule Commonly known as: MACROBID Take 1 capsule (100 mg total) by mouth at bedtime as needed (after intercourse).   NON FORMULARY Take 0.5 tablets by mouth at bedtime. CBD oil - for pain Gummies   omeprazole 40 MG capsule Commonly known as: PRILOSEC Take 1 capsule (40 mg total) by mouth daily.   ondansetron 4 MG tablet Commonly known as: ZOFRAN Take 1 tablet (4 mg total) by mouth every 8 (eight) hours as needed for nausea or vomiting.   Premarin vaginal cream Generic drug: conjugated estrogens PLACE 0.25 APPLICATORFULS VAGINALLY SEE ADMIN INSTRUCTIONS. INSERT 0.5 GRAMS VAGINALLY EVERY OTHER NIGHT    Prolia 60 MG/ML Sosy injection Generic drug: denosumab Inject 60 mg into the skin every 6 (six) months. Pt due 02/06/23   rosuvastatin 10 MG tablet Commonly known as: CRESTOR Take 1 tablet (10 mg total) by mouth daily.   sodium polystyrene 15 GM/60ML suspension Commonly known as: KAYEXALATE Take 60 ml by mouth x 4 days   SYSTANE OP Place 1 drop into both eyes in the morning and at bedtime.        Allergies:  Allergies  Allergen Reactions   Amoxicillin     Had fever with flu like symptoms. Has patient had a PCN reaction causing immediate rash, facial/tongue/throat swelling, SOB or lightheadedness with hypotension: no Has patient had a PCN reaction causing severe rash involving mucus membranes or skin necrosis: no Has patient had a PCN reaction that required hospitalization no Has patient had a PCN reaction occurring within the last 10 years: yes If all of the above answers are "NO", then may proceed with Cephalosporin use.   Clopidogrel & Aspirin Itching   Amlodipine Nausea Only    Dizzy, heavy feeling, tongue burning   Atorvastatin Other (See Comments)    Myalgia   Crestor [Rosuvastatin] Itching   Metoprolol Other (See Comments)    hair loss   Pravastatin Itching   Repatha [Evolocumab] Itching    Past Medical History, Surgical history, Social history, and Family History were reviewed and updated.  Review of Systems: All other 10 point review of systems is negative.   Physical Exam:  weight is 46.3 kg. Her oral temperature is 98.3 F (36.8 C). Her blood pressure is 126/72 and her pulse is 80. Her respiration is 18 and oxygen saturation is 100%.   Wt Readings from Last 3 Encounters:  05/15/23 46.3 kg  05/14/23 44.9 kg  04/28/23 45.4 kg    Ocular: Sclerae unicteric, pupils equal, round and reactive to light Ear-nose-throat: Oropharynx clear, dentition fair Lymphatic: No cervical or supraclavicular adenopathy Lungs no rales or rhonchi, good excursion  bilaterally Heart regular rate and rhythm, no murmur appreciated Abd soft, nontender, positive bowel sounds MSK no focal spinal tenderness, no joint edema Neuro: non-focal, well-oriented, appropriate affect Breasts: Deferred   Lab Results  Component Value Date   WBC 14.2 (H) 05/15/2023   HGB 12.0 05/15/2023   HCT 42.0 05/15/2023   MCV 67.7 (L) 05/15/2023   PLT 492 (H) 05/15/2023   Lab Results  Component Value Date   FERRITIN 22 02/26/2023   IRON 28 02/26/2023   TIBC 379 02/26/2023   UIBC 351 02/26/2023   IRONPCTSAT 7 (L) 02/26/2023   Lab Results  Component Value Date   RETICCTPCT 1.5 11/24/2022   RBC 6.20 (H) 05/15/2023   No results found for: "KPAFRELGTCHN", "LAMBDASER", "KAPLAMBRATIO"  No results found for: "IGGSERUM", "IGA", "IGMSERUM" No results found for: "TOTALPROTELP", "ALBUMINELP", "A1GS", "A2GS", "BETS", "BETA2SER", "GAMS", "MSPIKE", "SPEI"   Chemistry      Component Value Date/Time   NA 132 (L) 05/15/2023 1007   NA 139 04/15/2021 1027   K 5.3 (H) 05/15/2023 1007   CL 97 (L) 05/15/2023 1007   CO2 29 05/15/2023 1007   BUN 11 05/15/2023 1007   BUN 14 04/15/2021 1027   CREATININE 0.86 05/15/2023 1007   CREATININE 0.84 07/14/2016 0914      Component Value Date/Time   CALCIUM 9.9 05/15/2023 1007   ALKPHOS 65 05/15/2023 1007   AST 20 05/15/2023 1007   ALT 11 05/15/2023 1007   BILITOT 0.6 05/15/2023 1007       Impression and Plan: Katrina Walters is a very pleasant 67 yo caucasian female with polycythemia vera, JAK 2 positive.  Her hematocrit is 42% today and will hold off on phlebotomy.  Plan is to have her come in every 4 weeks for lab and possible phlebotomy if hematocrit is above 45%.  We discussed low carbohydrate diet today.  I recommended increasing the amount of protein in her diet and checking labels to see the carbohydrate content of her meals.  I also suggest that she reach out to her primary care provider to see if they can refer her to a dietitian for  more detailed discussion about her diet options.  Lab/phlebotomy in 4 and 8 weeks, lab/office visit last phlebotomy in 12 weeks.  Clenton Pare, NP 9/6/202411:02 AM

## 2023-05-18 ENCOUNTER — Encounter: Payer: Self-pay | Admitting: Medical

## 2023-05-18 NOTE — Addendum Note (Signed)
Addended by: Gwenevere Abbot on: 05/18/2023 08:30 PM   Modules accepted: Orders

## 2023-05-25 ENCOUNTER — Other Ambulatory Visit (INDEPENDENT_AMBULATORY_CARE_PROVIDER_SITE_OTHER): Payer: Medicare Other

## 2023-05-25 DIAGNOSIS — E875 Hyperkalemia: Secondary | ICD-10-CM | POA: Diagnosis not present

## 2023-05-25 LAB — COMPREHENSIVE METABOLIC PANEL
ALT: 14 U/L (ref 0–35)
AST: 20 U/L (ref 0–37)
Albumin: 4.1 g/dL (ref 3.5–5.2)
Alkaline Phosphatase: 70 U/L (ref 39–117)
BUN: 15 mg/dL (ref 6–23)
CO2: 31 mEq/L (ref 19–32)
Calcium: 10.1 mg/dL (ref 8.4–10.5)
Chloride: 98 meq/L (ref 96–112)
Creatinine, Ser: 0.89 mg/dL (ref 0.40–1.20)
GFR: 67.64 mL/min (ref >60.00)
Glucose, Bld: 66 mg/dL — ABNORMAL LOW (ref 70–99)
Potassium: 4.6 meq/L (ref 3.5–5.1)
Sodium: 136 meq/L (ref 135–145)
Total Bilirubin: 0.8 mg/dL (ref 0.2–1.2)
Total Protein: 7 g/dL (ref 6.0–8.3)

## 2023-05-26 ENCOUNTER — Encounter: Payer: Self-pay | Admitting: Medical

## 2023-06-02 DIAGNOSIS — L600 Ingrowing nail: Secondary | ICD-10-CM | POA: Diagnosis not present

## 2023-06-03 ENCOUNTER — Encounter: Payer: Self-pay | Admitting: Family

## 2023-06-05 ENCOUNTER — Other Ambulatory Visit: Payer: Self-pay | Admitting: Medical

## 2023-06-12 ENCOUNTER — Encounter: Payer: Self-pay | Admitting: Medical

## 2023-06-12 ENCOUNTER — Encounter: Payer: Self-pay | Admitting: Medical Oncology

## 2023-06-12 ENCOUNTER — Other Ambulatory Visit: Payer: Self-pay

## 2023-06-12 ENCOUNTER — Inpatient Hospital Stay: Payer: Medicare Other

## 2023-06-12 ENCOUNTER — Other Ambulatory Visit: Payer: Medicare Other

## 2023-06-12 ENCOUNTER — Ambulatory Visit: Payer: Medicare Other | Admitting: Medical Oncology

## 2023-06-12 ENCOUNTER — Inpatient Hospital Stay: Payer: Medicare Other | Attending: Hematology & Oncology

## 2023-06-12 ENCOUNTER — Inpatient Hospital Stay: Payer: Medicare Other | Admitting: Medical Oncology

## 2023-06-12 VITALS — BP 145/74 | HR 82 | Temp 97.8°F | Resp 18 | Ht 63.0 in | Wt 99.1 lb

## 2023-06-12 DIAGNOSIS — D45 Polycythemia vera: Secondary | ICD-10-CM | POA: Diagnosis not present

## 2023-06-12 DIAGNOSIS — R5383 Other fatigue: Secondary | ICD-10-CM | POA: Insufficient documentation

## 2023-06-12 DIAGNOSIS — Z7982 Long term (current) use of aspirin: Secondary | ICD-10-CM | POA: Diagnosis not present

## 2023-06-12 DIAGNOSIS — D751 Secondary polycythemia: Secondary | ICD-10-CM

## 2023-06-12 DIAGNOSIS — D509 Iron deficiency anemia, unspecified: Secondary | ICD-10-CM

## 2023-06-12 LAB — CBC WITH DIFFERENTIAL (CANCER CENTER ONLY)
Abs Immature Granulocytes: 0.1 10*3/uL — ABNORMAL HIGH (ref 0.00–0.07)
Basophils Absolute: 0.1 10*3/uL (ref 0.0–0.1)
Basophils Relative: 1 %
Eosinophils Absolute: 0.4 10*3/uL (ref 0.0–0.5)
Eosinophils Relative: 3 %
HCT: 44.3 % (ref 36.0–46.0)
Hemoglobin: 12.3 g/dL (ref 12.0–15.0)
Immature Granulocytes: 1 %
Lymphocytes Relative: 16 %
Lymphs Abs: 2.1 10*3/uL (ref 0.7–4.0)
MCH: 18.5 pg — ABNORMAL LOW (ref 26.0–34.0)
MCHC: 27.8 g/dL — ABNORMAL LOW (ref 30.0–36.0)
MCV: 66.6 fL — ABNORMAL LOW (ref 80.0–100.0)
Monocytes Absolute: 0.7 10*3/uL (ref 0.1–1.0)
Monocytes Relative: 5 %
Neutro Abs: 9.9 10*3/uL — ABNORMAL HIGH (ref 1.7–7.7)
Neutrophils Relative %: 74 %
Platelet Count: 458 10*3/uL — ABNORMAL HIGH (ref 150–400)
RBC: 6.65 MIL/uL — ABNORMAL HIGH (ref 3.87–5.11)
RDW: 24.2 % — ABNORMAL HIGH (ref 11.5–15.5)
WBC Count: 13.4 10*3/uL — ABNORMAL HIGH (ref 4.0–10.5)
nRBC: 0 % (ref 0.0–0.2)

## 2023-06-12 LAB — IRON AND IRON BINDING CAPACITY (CC-WL,HP ONLY)
Iron: 31 ug/dL (ref 28–170)
Saturation Ratios: 7 % — ABNORMAL LOW (ref 10.4–31.8)
TIBC: 440 ug/dL (ref 250–450)
UIBC: 409 ug/dL (ref 148–442)

## 2023-06-12 LAB — FERRITIN: Ferritin: 6 ng/mL — ABNORMAL LOW (ref 11–307)

## 2023-06-12 NOTE — Progress Notes (Signed)
Hematology and Oncology Follow Up Visit  Katrina Walters [id number] Nov 23, 1955 67 y.o. 06/12/2023   Principle Diagnosis:  Polycythemia Vera -- JAK2 (+)   Current Therapy:        Phlebotomy to maintain HCT below 45% Aspirin 81 mg PO daily               Interim History:  Ms. Tryba is here today with her husband for follow-up.    Today she reports that she is doing ok but has been fatigued overall.   She is being followed closely by podiatry for a fungal infection of multiple toes. She recently had her right 3rd toenail removed. She shows me this today.   At her last visit she discussed being diagnosed with an H. Pylori infection. She was unable to be treated for this due to medication intolerance. She is being referred to [facility] by her PCP for further management. She reports occasional shortness of breath but no chest pain.  Denies nausea and vomiting.  Weight is overall stable.  No bleeding reported.  Weight has trended down- suspected to be due to diet changes. She reports no new bone pain or unintentional weight loss.   Wt Readings from Last 3 Encounters:  06/12/23 99 lb 1.3 oz (44.9 kg)  05/15/23 102 lb (46.3 kg)  05/14/23 99 lb (44.9 kg)   ECOG Performance Status: 1 - Symptomatic but completely ambulatory  Medications:  Allergies as of 06/12/2023       Reactions   Amoxicillin    Had fever with flu like symptoms. Has patient had a PCN reaction causing immediate rash, facial/tongue/throat swelling, SOB or lightheadedness with hypotension: no Has patient had a PCN reaction causing severe rash involving mucus membranes or skin necrosis: no Has patient had a PCN reaction that required hospitalization no Has patient had a PCN reaction occurring within the last 10 years: yes If all of the above answers are "NO", then may proceed with Cephalosporin use.   Clopidogrel & Aspirin Itching   Amlodipine Nausea Only   Dizzy, heavy feeling, tongue burning   Atorvastatin Other (See Comments)    Myalgia   Crestor [rosuvastatin] Itching   Metoprolol Other (See Comments)   hair loss   Pravastatin Itching   Repatha [evolocumab] Itching        Medication List        Accurate as of June 12, 2023 10:58 AM. If you have any questions, ask your nurse or doctor.          aspirin 81 MG chewable tablet Chew by mouth daily.   BENEFIBER DRINK MIX PO Take by mouth daily at 6 (six) AM.   Bismuth 262 MG Chew Chew 524 mg by mouth in the morning, at noon, in the evening, and at bedtime.   colchicine 0.6 MG tablet Take 1 tablet (0.6 mg total) by mouth 2 (two) times daily.   Cranberry-Vitamin C 250-60 MG Caps Take 2 tablets by mouth daily.   D-MANNOSE PO Take 2 tablets by mouth daily.   diclofenac 75 MG EC tablet Commonly known as: VOLTAREN Take 1 tablet (75 mg total) by mouth 2 (two) times daily. What changed: Another medication with the same name was removed. Continue taking this medication, and follow the directions you see here. Changed by: Rushie Chestnut   diphenhydrAMINE 25 MG tablet Commonly known as: BENADRYL Take 25 mg by mouth at bedtime.   ezetimibe 10 MG tablet Commonly known as: ZETIA Take 1 tablet (10 mg  total) by mouth daily. Please keep scheduled appointment for additional refills.   FIBER PO Take 2 tablets by mouth daily.   levocetirizine 5 MG tablet Commonly known as: XYZAL Take 1 tablet (5 mg total) by mouth every evening.   linaclotide 290 MCG Caps capsule Commonly known as: LINZESS Take 1 capsule (290 mcg total) by mouth daily before breakfast.   nitazoxanide 500 MG tablet Commonly known as: ALINIA Take 1 tablet (500 mg total) by mouth 2 (two) times daily with a meal.   nitrofurantoin (macrocrystal-monohydrate) 100 MG capsule Commonly known as: MACROBID Take 1 capsule (100 mg total) by mouth at bedtime as needed (after intercourse).   NON FORMULARY Take 0.5 tablets by mouth at bedtime. CBD oil - for pain Gummies   omeprazole  40 MG capsule Commonly known as: PRILOSEC TAKE 1 CAPSULE (40 MG TOTAL) BY MOUTH DAILY.   ondansetron 4 MG tablet Commonly known as: ZOFRAN Take 1 tablet (4 mg total) by mouth every 8 (eight) hours as needed for nausea or vomiting.   Premarin vaginal cream Generic drug: conjugated estrogens PLACE 0.25 APPLICATORFULS VAGINALLY SEE ADMIN INSTRUCTIONS. INSERT 0.5 GRAMS VAGINALLY EVERY OTHER NIGHT   Prolia 60 MG/ML Sosy injection Generic drug: denosumab Inject 60 mg into the skin every 6 (six) months. Pt due 02/06/23   rosuvastatin 10 MG tablet Commonly known as: CRESTOR Take 1 tablet (10 mg total) by mouth daily.   SPS 15 GM/60ML suspension Generic drug: sodium polystyrene Take 60 ml by mouth x 4 days   SYSTANE OP Place 1 drop into both eyes in the morning and at bedtime.        Allergies:  Allergies  Allergen Reactions   Amoxicillin     Had fever with flu like symptoms. Has patient had a PCN reaction causing immediate rash, facial/tongue/throat swelling, SOB or lightheadedness with hypotension: no Has patient had a PCN reaction causing severe rash involving mucus membranes or skin necrosis: no Has patient had a PCN reaction that required hospitalization no Has patient had a PCN reaction occurring within the last 10 years: yes If all of the above answers are "NO", then may proceed with Cephalosporin use.   Clopidogrel & Aspirin Itching   Amlodipine Nausea Only    Dizzy, heavy feeling, tongue burning   Atorvastatin Other (See Comments)    Myalgia   Crestor [Rosuvastatin] Itching   Metoprolol Other (See Comments)    hair loss   Pravastatin Itching   Repatha [Evolocumab] Itching    Past Medical History, Surgical history, Social history, and Family History were reviewed and updated.  Review of Systems: All other 10 point review of systems is negative.   Physical Exam:  height is 5\' 3"  (1.6 m) and weight is 99 lb 1.3 oz (44.9 kg). Her oral temperature is 97.8 F (36.6  C). Her blood pressure is 145/74 (abnormal) and her pulse is 82. Her respiration is 18 and oxygen saturation is 100%.   Wt Readings from Last 3 Encounters:  06/12/23 99 lb 1.3 oz (44.9 kg)  05/15/23 102 lb (46.3 kg)  05/14/23 99 lb (44.9 kg)   Constitutional: Thin  Ocular: Sclerae unicteric, pupils equal, round and reactive to light Ear-nose-throat: Oropharynx clear, dentition fair Lymphatic: No cervical or supraclavicular adenopathy Lungs no rales or rhonchi, good excursion bilaterally Heart regular rate and rhythm, no murmur appreciated. Bilateral feet are cooler to touch with 1+ PD bilaterally. Mild dusky hue of the distal right foot without edema, tenderness. Remaining toenails with  hyperkeratosis.  Abd soft, nontender, positive bowel sounds MSK no focal spinal tenderness, no joint edema Neuro: non-focal, well-oriented, appropriate affect   Lab Results  Component Value Date   WBC 13.4 (H) 06/12/2023   HGB 12.3 06/12/2023   HCT 44.3 06/12/2023   MCV 66.6 (L) 06/12/2023   PLT 458 (H) 06/12/2023   Lab Results  Component Value Date   FERRITIN 6 (L) 05/15/2023   IRON 20 (L) 05/15/2023   TIBC 438 05/15/2023   UIBC 418 05/15/2023   IRONPCTSAT 5 (L) 05/15/2023   Lab Results  Component Value Date   RETICCTPCT 1.5 11/24/2022   RBC 6.65 (H) 06/12/2023   No results found for: "KPAFRELGTCHN", "LAMBDASER", "KAPLAMBRATIO" No results found for: "IGGSERUM", "IGA", "IGMSERUM" No results found for: "TOTALPROTELP", "ALBUMINELP", "A1GS", "A2GS", "BETS", "BETA2SER", "GAMS", "MSPIKE", "SPEI"   Chemistry      Component Value Date/Time   NA 136 05/25/2023 1256   NA 139 04/15/2021 1027   K 4.6 05/25/2023 1256   CL 98 05/25/2023 1256   CO2 31 05/25/2023 1256   BUN 15 05/25/2023 1256   BUN 14 04/15/2021 1027   CREATININE 0.89 05/25/2023 1256   CREATININE 0.86 05/15/2023 1007   CREATININE 0.84 07/14/2016 0914      Component Value Date/Time   CALCIUM 10.1 05/25/2023 1256   ALKPHOS 70  05/25/2023 1256   AST 20 05/25/2023 1256   AST 20 05/15/2023 1007   ALT 14 05/25/2023 1256   ALT 11 05/15/2023 1007   BILITOT 0.8 05/25/2023 1256   BILITOT 0.6 05/15/2023 1007      Encounter Diagnoses  Name Primary?   Polycythemia vera (HCC) Yes   Erythrocytosis     Impression and Plan: Ms. Carol is a very pleasant 66 yo caucasian female with polycythemia vera, JAK 2 positive.  Her hematocrit is 44.3% today and will hold off on phlebotomy.  Plan is to have her come in every 4 weeks for lab and possible phlebotomy if hematocrit is above 45%. She also has a chronically elevated WBC and elevated platelets. Elevated platelets suspected to be due to iron deficiency s/p phlebotomy for her PV management. Neutrophilia likely secondary to her PV thought could be related to her current H. Pylori infection. She is not having any new bone pains, unintentional weight loss or night sweats. We will watch her CBC  closely and obtain a BMB if levels suddenly worsen.   I discussed that I recommended a referral to a vascular specialist given the unilateral hyperkeratosis of her right toenails, skin color changes, and decreased DP pulses. This appears chronic in nature and not acute. Reviewed red flags. She is going to discuss this referral with her PCP today.   Lab/phlebotomy in 4 and 8 weeks, lab/office visit last phlebotomy in 12 weeks.   Rushie Chestnut, PA-C 10/4/202410:58 AM

## 2023-06-13 NOTE — Addendum Note (Signed)
Addended by: Gwenevere Abbot on: 06/13/2023 07:22 AM   Modules accepted: Orders

## 2023-06-15 ENCOUNTER — Ambulatory Visit (INDEPENDENT_AMBULATORY_CARE_PROVIDER_SITE_OTHER): Payer: Medicare Other | Admitting: Medical

## 2023-06-15 ENCOUNTER — Encounter: Payer: Self-pay | Admitting: Medical

## 2023-06-15 VITALS — BP 132/70 | HR 80 | Resp 17 | Ht 63.0 in | Wt 98.4 lb

## 2023-06-15 DIAGNOSIS — R238 Other skin changes: Secondary | ICD-10-CM | POA: Diagnosis not present

## 2023-06-15 DIAGNOSIS — A048 Other specified bacterial intestinal infections: Secondary | ICD-10-CM

## 2023-06-15 DIAGNOSIS — E875 Hyperkalemia: Secondary | ICD-10-CM

## 2023-06-15 DIAGNOSIS — R252 Cramp and spasm: Secondary | ICD-10-CM

## 2023-06-15 DIAGNOSIS — R0989 Other specified symptoms and signs involving the circulatory and respiratory systems: Secondary | ICD-10-CM

## 2023-06-15 LAB — COMPREHENSIVE METABOLIC PANEL
ALT: 21 U/L (ref 0–35)
AST: 27 U/L (ref 0–37)
Albumin: 4.3 g/dL (ref 3.5–5.2)
Alkaline Phosphatase: 68 U/L (ref 39–117)
BUN: 12 mg/dL (ref 6–23)
CO2: 28 meq/L (ref 19–32)
Calcium: 10 mg/dL (ref 8.4–10.5)
Chloride: 101 meq/L (ref 96–112)
Creatinine, Ser: 0.69 mg/dL (ref 0.40–1.20)
GFR: 90.5 mL/min (ref >60.00)
Glucose, Bld: 74 mg/dL (ref 70–99)
Potassium: 5.6 meq/L — ABNORMAL HIGH (ref 3.5–5.1)
Sodium: 136 meq/L (ref 135–145)
Total Bilirubin: 0.7 mg/dL (ref 0.2–1.2)
Total Protein: 7 g/dL (ref 6.0–8.3)

## 2023-06-15 LAB — MAGNESIUM: Magnesium: 1.9 mg/dL (ref 1.5–2.5)

## 2023-06-15 MED ORDER — SODIUM POLYSTYRENE SULFONATE 15 GM/60ML PO SUSP
15.0000 g | Freq: Every day | ORAL | 0 refills | Status: DC
  Filled 2023-06-15 – 2023-06-17 (×2): qty 240, 4d supply, fill #0

## 2023-06-15 MED ORDER — MUPIROCIN 2 % EX OINT
1.0000 | TOPICAL_OINTMENT | Freq: Two times a day (BID) | CUTANEOUS | 0 refills | Status: AC
Start: 2023-06-15 — End: ?

## 2023-06-15 NOTE — Patient Instructions (Addendum)
1. Cramp in limb at rest. Not on activity. - Magnesium - Comp Met (CMET)  2. Decreased pedal pulses -referral placed today based on hematology opinion and exam today - Ambulatory referral to Vascular Surgery  3. Change of skin color -mild red appearing but not warm or tender to touch - Ambulatory referral to Vascular Surgery -scab to rt 3rd toe- rx mupirocin topical. No yellow dc. No need for oral antibiotic.  4. 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 Please call [facility] clinic. -[facility]  [address]  [place], [place] [address]  [phone]   Follow up date to be determined after vascular surgeon referral. If any changes to foot let me know and that can be factor of seeing vascular sooner

## 2023-06-15 NOTE — Progress Notes (Signed)
Subjective:    Patient ID: Katrina Walters, female    DOB: 21-Nov-1955, 67 y.o.   MRN: [id number]  HPI  Pt recently had 3rd digit toe nail removed by podiatrist. That toe nail area is faintly tender. Has scab present. No DC presently pt staes end of September 23 had toe nail removed.  Pt has some mild pain at times with foot. No cramps to calfs on walking but will occur randomly.    Pt hematologist note recommended.  referral to a vascular specialist given the unilateral hyperkeratosis of her right toenails, skin color changes, and decreased DP pulses. This appears chronic in nature and not acute. Reviewed red flags. She is going to discuss this referral with her PCP today.     Review of Systems  Constitutional:  Negative for chills, fatigue and fever.  Respiratory:  Negative for cough, chest tightness, shortness of breath and wheezing.   Cardiovascular:  Negative for chest pain and palpitations.  Gastrointestinal:  Negative for abdominal pain and constipation.  Musculoskeletal:  Negative for back pain and joint swelling.       See hpi  Neurological:  Negative for dizziness and light-headedness.  Hematological:  Negative for adenopathy. Does not bruise/bleed easily.  Psychiatric/Behavioral:  Negative for behavioral problems and confusion.        Objective:   Physical Exam  General Mental Status- Alert. General Appearance- Not in acute distress.       Chest and Lung Exam Auscultation: Breath Sounds:-Normal.  Cardiovascular Auscultation:Rythm- Regular. Murmurs & Other Heart Sounds:Auscultation of the heart reveals- No Murmurs.  Abdomen Inspection:-Inspeection Normal. Palpation/Percussion:Note:No mass. Palpation and Percussion of the abdomen reveal- Non Tender, Non Distended + BS, no rebound or guarding.   Neurologic Cranial Nerve exam:- CN III-XII intact(No nystagmus), symmetric smile. Strength:- 5/5 equal and symmetric strength both upper and lower extremities.     Bilateral- some decreased pulse minimally. Capillary refill intact but mild delayed. Rt 3rd toe small scab from prior nail removal. No yellow dc. Foot is not warm to touch or swollen.      Assessment & Plan:   Patient Instructions  1. Cramp in limb at rest. Not on activity. - Magnesium - Comp Met (CMET)  2. Decreased pedal pulses -referral placed today based on hematology opinion and exam today - Ambulatory referral to Vascular Surgery  3. Change of skin color -mild red appearing but not warm or tender to touch - Ambulatory referral to Vascular Surgery -scab to rt 3rd toe- rx mupirocin topical. No yellow dc. No need for oral antibiotic.  4. 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 Please call [facility] clinic. -[facility]  [address]  [place], [place] [address]  [phone]   Follow up date to be determined after vascular surgeon referral. If any changes to foot let me know and that can be factor of seeing vascluar sooner     Esperanza Richters, PA-C

## 2023-06-15 NOTE — Addendum Note (Signed)
Addended by: Gwenevere Abbot on: 06/15/2023 08:37 PM   Modules accepted: Orders

## 2023-06-16 ENCOUNTER — Inpatient Hospital Stay: Payer: Medicare Other

## 2023-06-16 ENCOUNTER — Other Ambulatory Visit: Payer: Self-pay

## 2023-06-16 ENCOUNTER — Other Ambulatory Visit (HOSPITAL_BASED_OUTPATIENT_CLINIC_OR_DEPARTMENT_OTHER): Payer: Self-pay

## 2023-06-16 ENCOUNTER — Other Ambulatory Visit: Payer: Medicare Other

## 2023-06-16 ENCOUNTER — Ambulatory Visit: Payer: Medicare Other | Admitting: Medical Oncology

## 2023-06-17 ENCOUNTER — Other Ambulatory Visit (HOSPITAL_BASED_OUTPATIENT_CLINIC_OR_DEPARTMENT_OTHER): Payer: Self-pay

## 2023-06-17 ENCOUNTER — Other Ambulatory Visit: Payer: Self-pay | Admitting: *Deleted

## 2023-06-17 DIAGNOSIS — R0989 Other specified symptoms and signs involving the circulatory and respiratory systems: Secondary | ICD-10-CM

## 2023-06-20 NOTE — Progress Notes (Unsigned)
Patient ID: Katrina Walters, female   DOB: [birth year], 67 y.o.   MRN: [id number]  Reason for Consult: No chief complaint on file.   Referred by Esperanza Richters, PA-C  Subjective:     HPI  Katrina Walters is a 67 y.o. female with a history of polycythemia vera, CAD and hyperlipidemia presenting for hyperkeratosis of right toenails and possible fungal infection in the setting of decreased DP pulses.  She denies pain with ambulation, rest pain or nonhealing ulcerations.  She is a never smoker.  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   Osteoporosis 09/2016   Polycythemia vera (HCC)    PONV (postoperative nausea and vomiting)    Family History  Problem Relation Age of Onset   Heart disease Mother        died at 65 of heart attack   Hypertension Mother    Heart attack Mother    Heart disease Father        had heart attack at age 51   Heart attack Father    Rectal cancer Father 37   Hearing loss Father    Cancer Paternal Aunt    Stomach cancer Paternal Grandmother    Colon cancer Neg Hx    Colon polyps Neg Hx    Esophageal cancer Neg Hx    Past Surgical History:  Procedure Laterality Date   ABDOMINAL HYSTERECTOMY  08/2019   ANAL RECTAL MANOMETRY N/A 11/12/2020   Procedure: ANO RECTAL MANOMETRY;  Surgeon: Shellia Cleverly, DO;  Location: [facility];  Service: Gastroenterology;  Laterality: N/A;   BLADDER SURGERY  08/2019   BREAST REDUCTION SURGERY  2006   CARDIAC CATHETERIZATION N/A 07/17/2016   Procedure: Left Heart Cath and Coronary Angiography;  Surgeon: Kathleene Hazel, MD;  Location: [facility] INVASIVE CV LAB;  Service:  Cardiovascular;  Laterality: N/A;   CATARACT EXTRACTION W/ INTRAOCULAR LENS IMPLANT Bilateral    COLONOSCOPY  2015   CORONARY ARTERY BYPASS GRAFT N/A 06/04/2021   Procedure: CORONARY ARTERY BYPASS GRAFTING (CABG), ON PUMP, TIMES FOUR, USING LEFT INTERNAL MAMMARY ARTERY, LEFT RADIAL ARTERY (HARVESTED OPEN), AND RIGHT GREATER SAPHENOUS VEIN (HARVESTED ENDOSCOPICALLY);  Surgeon: Corliss Skains, MD;  Location: [facility];  Service: Open Heart Surgery;  Laterality: N/A;   EYE SURGERY     LEFT HEART CATH AND CORONARY ANGIOGRAPHY N/A 04/22/2021   Procedure: LEFT HEART CATH AND CORONARY ANGIOGRAPHY;  Surgeon: Lyn Records, MD;  Location: [facility] INVASIVE CV LAB;  Service: Cardiovascular;  Laterality: N/A;   POLYPECTOMY  2015   RADIAL ARTERY HARVEST Left 06/04/2021   Procedure: RADIAL ARTERY HARVEST;  Surgeon: Corliss Skains, MD;  Location: [facility] OR;  Service: Open Heart Surgery;  Laterality: Left;   TEE WITHOUT CARDIOVERSION N/A 06/04/2021   Procedure: TRANSESOPHAGEAL ECHOCARDIOGRAM (TEE);  Surgeon: Corliss Skains, MD;  Location: [facility] OR;  Service: Open Heart Surgery;  Laterality: N/A;   TONSILLECTOMY      Short Social History:  Social History   Tobacco Use   Smoking status: Never   Smokeless  tobacco: Never  Substance Use Topics   Alcohol use: Yes    Alcohol/week: 1.0 standard drink of alcohol    Types: 1 Glasses of wine per week    Comment: occassionally    Allergies  Allergen Reactions   Amoxicillin     Had fever with flu like symptoms. Has patient had a PCN reaction causing immediate rash, facial/tongue/throat swelling, SOB or lightheadedness with hypotension: no Has patient had a PCN reaction causing severe rash involving mucus membranes or skin necrosis: no Has patient had a PCN reaction that required hospitalization no Has patient had a PCN reaction occurring within the last 10 years: yes If all of the above answers are "NO", then may proceed with Cephalosporin use.    Clopidogrel & Aspirin Itching   Amlodipine Nausea Only    Dizzy, heavy feeling, tongue burning   Atorvastatin Other (See Comments)    Myalgia   Crestor [Rosuvastatin] Itching   Metoprolol Other (See Comments)    hair loss   Pravastatin Itching   Repatha [Evolocumab] Itching    Current Outpatient Medications  Medication Sig Dispense Refill   aspirin 81 MG chewable tablet Chew by mouth daily.     Bismuth 262 MG CHEW Chew 524 mg by mouth in the morning, at noon, in the evening, and at bedtime. 112 tablet 0   colchicine 0.6 MG tablet Take 1 tablet (0.6 mg total) by mouth 2 (two) times daily. 180 tablet 3   Cranberry-Vitamin C 250-60 MG CAPS Take 2 tablets by mouth daily.     D-MANNOSE PO Take 2 tablets by mouth daily.     denosumab (PROLIA) 60 MG/ML SOSY injection Inject 60 mg into the skin every 6 (six) months. Pt due 02/06/23 1 mL 0   diclofenac (VOLTAREN) 75 MG EC tablet Take 1 tablet (75 mg total) by mouth 2 (two) times daily. 20 tablet 0   diphenhydrAMINE (BENADRYL) 25 MG tablet Take 25 mg by mouth at bedtime.     ezetimibe (ZETIA) 10 MG tablet Take 1 tablet (10 mg total) by mouth daily. Please keep scheduled appointment for additional refills. 90 tablet 3   FIBER PO Take 2 tablets by mouth daily.     levocetirizine (XYZAL) 5 MG tablet Take 1 tablet (5 mg total) by mouth every evening. 30 tablet 3   linaclotide (LINZESS) 290 MCG CAPS capsule Take 1 capsule (290 mcg total) by mouth daily before breakfast. 90 capsule 5   mupirocin ointment (BACTROBAN) 2 % Apply 1 Application topically 2 (two) times daily. 22 g 0   nitazoxanide (ALINIA) 500 MG tablet Take 1 tablet (500 mg total) by mouth 2 (two) times daily with a meal. 20 tablet 0   nitrofurantoin, macrocrystal-monohydrate, (MACROBID) 100 MG capsule Take 1 capsule (100 mg total) by mouth at bedtime as needed (after intercourse). 90 capsule 3   NON FORMULARY Take 0.5 tablets by mouth at bedtime. CBD oil - for pain Gummies     omeprazole  (PRILOSEC) 40 MG capsule TAKE 1 CAPSULE (40 MG TOTAL) BY MOUTH DAILY. 90 capsule 1   ondansetron (ZOFRAN) 4 MG tablet Take 1 tablet (4 mg total) by mouth every 8 (eight) hours as needed for nausea or vomiting. 30 tablet 1   Polyethyl Glycol-Propyl Glycol (SYSTANE OP) Place 1 drop into both eyes in the morning and at bedtime.     PREMARIN vaginal cream PLACE 0.25 APPLICATORFULS VAGINALLY SEE ADMIN INSTRUCTIONS. INSERT 0.5 GRAMS VAGINALLY EVERY OTHER NIGHT 30 g 12  rosuvastatin (CRESTOR) 10 MG tablet Take 1 tablet (10 mg total) by mouth daily. 90 tablet 3   sodium polystyrene (KAYEXALATE) 15 GM/60ML suspension Take 60 mLs (15 g total) by mouth daily for 4 days 240 mL 0   Wheat Dextrin (BENEFIBER DRINK MIX PO) Take by mouth daily at 6 (six) AM.     No current facility-administered medications for this visit.    REVIEW OF SYSTEMS  Negative other than noted in HPI     Objective:  Objective   There were no vitals filed for this visit. There is no height or weight on file to calculate BMI.  Physical Exam General: no acute distress Cardiac: hemodynamically stable, nontachycardic Pulm: normal work of breathing GI: non-tender, no pulsatile mass Neuro: alert, no focal deficit Extremities: no edema, cyanosis or wounds*** Vascular:   Right: Palpable femoral, DP, PT   Left: Palpable femoral, DP, PT   Data: ABI: ***     Assessment/Plan:     Chrishawna Millikin is a 67 y.o. female with hyperkeratosis and fungal infection of right toenails.  The third toenail on the right foot was removed by podiatry and is healing.  ABI and exam demonstrate normal perfusion and adequate wound healing potential Follow-up as needed    Daria Pastures MD [facility]

## 2023-06-22 ENCOUNTER — Other Ambulatory Visit: Payer: Self-pay | Admitting: Medical

## 2023-06-22 ENCOUNTER — Ambulatory Visit (INDEPENDENT_AMBULATORY_CARE_PROVIDER_SITE_OTHER): Payer: Medicare Other | Admitting: Vascular Surgery

## 2023-06-22 ENCOUNTER — Ambulatory Visit (HOSPITAL_COMMUNITY)
Admission: RE | Admit: 2023-06-22 | Discharge: 2023-06-22 | Disposition: A | Discharge status: Home / Self Care | Payer: Medicare Other | Source: Ambulatory Visit | Attending: Vascular Surgery | Admitting: Vascular Surgery

## 2023-06-22 ENCOUNTER — Encounter: Payer: Self-pay | Admitting: Vascular Surgery

## 2023-06-22 VITALS — BP 125/77 | HR 74 | Temp 98.2°F | Ht 63.0 in | Wt 99.5 lb

## 2023-06-22 DIAGNOSIS — R0989 Other specified symptoms and signs involving the circulatory and respiratory systems: Secondary | ICD-10-CM | POA: Insufficient documentation

## 2023-06-22 DIAGNOSIS — I872 Venous insufficiency (chronic) (peripheral): Secondary | ICD-10-CM

## 2023-06-22 LAB — VAS US ABI WITH/WO TBI
Left ABI: 1.19
Right ABI: 1.05

## 2023-06-24 ENCOUNTER — Ambulatory Visit: Payer: Medicare Other

## 2023-06-24 VITALS — BP 135/71 | HR 72 | Wt 100.0 lb

## 2023-06-24 DIAGNOSIS — R300 Dysuria: Secondary | ICD-10-CM

## 2023-06-24 DIAGNOSIS — R3915 Urgency of urination: Secondary | ICD-10-CM

## 2023-06-24 LAB — POCT URINALYSIS DIPSTICK
Bilirubin, UA: NEGATIVE
Blood, UA: NEGATIVE
Glucose, UA: NEGATIVE
Ketones, UA: NEGATIVE
Nitrite, UA: POSITIVE
Protein, UA: NEGATIVE
Spec Grav, UA: 1.01 (ref 1.010–1.025)
Urobilinogen, UA: NEGATIVE U/dL — AB
pH, UA: 6.5 (ref 5.0–8.0)

## 2023-06-24 MED ORDER — NITROFURANTOIN MONOHYD MACRO 100 MG PO CAPS
100.0000 mg | ORAL_CAPSULE | Freq: Two times a day (BID) | ORAL | 0 refills | Status: DC
Start: 2023-06-24 — End: 2023-06-24

## 2023-06-24 MED ORDER — NITROFURANTOIN MONOHYD MACRO 100 MG PO CAPS
ORAL_CAPSULE | ORAL | 0 refills | Status: DC
Start: 2023-06-24 — End: 2023-08-19

## 2023-06-24 NOTE — Progress Notes (Signed)
SUBJECTIVE: Katrina Walters is a 67 y.o. female who complains of urinary frequency, urgency and dysuria x 1 days, without flank pain, fever, chills, or abnormal vaginal discharge or bleeding.   OBJECTIVE: Appears well, in no apparent distress.  Vital signs are normal. Urine dipstick shows positive for WBC's.    ASSESSMENT: Dysuria  PLAN: Treatment per orders.  Call or return to clinic prn if these symptoms worsen or fail to improve as anticipated.

## 2023-06-26 LAB — URINE CULTURE: Organism ID, Bacteria: NO GROWTH

## 2023-07-06 ENCOUNTER — Other Ambulatory Visit (INDEPENDENT_AMBULATORY_CARE_PROVIDER_SITE_OTHER): Payer: Medicare Other

## 2023-07-06 ENCOUNTER — Other Ambulatory Visit (HOSPITAL_BASED_OUTPATIENT_CLINIC_OR_DEPARTMENT_OTHER): Payer: Self-pay

## 2023-07-06 ENCOUNTER — Encounter: Payer: Self-pay | Admitting: Medical

## 2023-07-06 DIAGNOSIS — E875 Hyperkalemia: Secondary | ICD-10-CM

## 2023-07-06 LAB — COMPREHENSIVE METABOLIC PANEL
ALT: 19 U/L (ref 0–35)
AST: 24 U/L (ref 0–37)
Albumin: 4.1 g/dL (ref 3.5–5.2)
Alkaline Phosphatase: 68 U/L (ref 39–117)
BUN: 12 mg/dL (ref 6–23)
CO2: 27 meq/L (ref 19–32)
Calcium: 9.1 mg/dL (ref 8.4–10.5)
Chloride: 100 meq/L (ref 96–112)
Creatinine, Ser: 0.75 mg/dL (ref 0.40–1.20)
GFR: 82.99 mL/min (ref >60.00)
Glucose, Bld: 82 mg/dL (ref 70–99)
Potassium: 4.8 meq/L (ref 3.5–5.1)
Sodium: 133 meq/L — ABNORMAL LOW (ref 135–145)
Total Bilirubin: 0.7 mg/dL (ref 0.2–1.2)
Total Protein: 6.8 g/dL (ref 6.0–8.3)

## 2023-07-06 NOTE — Addendum Note (Signed)
Addended by: Gwenevere Abbot on: 07/06/2023 04:37 PM   Modules accepted: Orders

## 2023-07-13 ENCOUNTER — Other Ambulatory Visit (HOSPITAL_BASED_OUTPATIENT_CLINIC_OR_DEPARTMENT_OTHER): Payer: Self-pay

## 2023-07-13 ENCOUNTER — Encounter: Payer: Self-pay | Admitting: Medical

## 2023-07-13 ENCOUNTER — Other Ambulatory Visit: Payer: Self-pay | Admitting: Medical

## 2023-07-13 ENCOUNTER — Inpatient Hospital Stay: Payer: Medicare Other | Attending: Hematology & Oncology

## 2023-07-13 ENCOUNTER — Other Ambulatory Visit: Payer: Self-pay

## 2023-07-13 ENCOUNTER — Inpatient Hospital Stay: Payer: Medicare Other

## 2023-07-13 ENCOUNTER — Encounter: Payer: Self-pay | Admitting: Medical Oncology

## 2023-07-13 ENCOUNTER — Inpatient Hospital Stay: Payer: Medicare Other | Admitting: Medical Oncology

## 2023-07-13 VITALS — BP 119/72 | HR 70 | Temp 97.7°F | Resp 18 | Ht 63.0 in | Wt 101.0 lb

## 2023-07-13 DIAGNOSIS — D45 Polycythemia vera: Secondary | ICD-10-CM | POA: Diagnosis not present

## 2023-07-13 DIAGNOSIS — E875 Hyperkalemia: Secondary | ICD-10-CM | POA: Diagnosis not present

## 2023-07-13 DIAGNOSIS — D72829 Elevated white blood cell count, unspecified: Secondary | ICD-10-CM | POA: Diagnosis not present

## 2023-07-13 DIAGNOSIS — D509 Iron deficiency anemia, unspecified: Secondary | ICD-10-CM | POA: Insufficient documentation

## 2023-07-13 DIAGNOSIS — R5383 Other fatigue: Secondary | ICD-10-CM | POA: Insufficient documentation

## 2023-07-13 DIAGNOSIS — Z7982 Long term (current) use of aspirin: Secondary | ICD-10-CM | POA: Insufficient documentation

## 2023-07-13 DIAGNOSIS — R0602 Shortness of breath: Secondary | ICD-10-CM | POA: Diagnosis not present

## 2023-07-13 LAB — CMP (CANCER CENTER ONLY)
ALT: 16 U/L (ref 0–44)
AST: 22 U/L (ref 15–41)
Albumin: 4.4 g/dL (ref 3.5–5.0)
Alkaline Phosphatase: 64 U/L (ref 38–126)
Anion gap: 6 (ref 5–15)
BUN: 14 mg/dL (ref 8–23)
CO2: 30 mmol/L (ref 22–32)
Calcium: 9.5 mg/dL (ref 8.9–10.3)
Chloride: 101 mmol/L (ref 98–111)
Creatinine: 0.78 mg/dL (ref 0.44–1.00)
GFR, Estimated: >60 mL/min (ref >60)
Glucose, Bld: 125 mg/dL — ABNORMAL HIGH (ref 70–99)
Potassium: 5.6 mmol/L — ABNORMAL HIGH (ref 3.5–5.1)
Sodium: 137 mmol/L (ref 135–145)
Total Bilirubin: 0.7 mg/dL (ref <1.2)
Total Protein: 7.3 g/dL (ref 6.5–8.1)

## 2023-07-13 LAB — CBC WITH DIFFERENTIAL (CANCER CENTER ONLY)
Abs Immature Granulocytes: 0.07 10*3/uL (ref 0.00–0.07)
Basophils Absolute: 0.1 10*3/uL (ref 0.0–0.1)
Basophils Relative: 1 %
Eosinophils Absolute: 0.4 10*3/uL (ref 0.0–0.5)
Eosinophils Relative: 3 %
HCT: 42.1 % (ref 36.0–46.0)
Hemoglobin: 11.6 g/dL — ABNORMAL LOW (ref 12.0–15.0)
Immature Granulocytes: 1 %
Lymphocytes Relative: 14 %
Lymphs Abs: 1.8 10*3/uL (ref 0.7–4.0)
MCH: 18.1 pg — ABNORMAL LOW (ref 26.0–34.0)
MCHC: 27.6 g/dL — ABNORMAL LOW (ref 30.0–36.0)
MCV: 65.8 fL — ABNORMAL LOW (ref 80.0–100.0)
Monocytes Absolute: 0.7 10*3/uL (ref 0.1–1.0)
Monocytes Relative: 6 %
Neutro Abs: 9.5 10*3/uL — ABNORMAL HIGH (ref 1.7–7.7)
Neutrophils Relative %: 75 %
Platelet Count: 435 10*3/uL — ABNORMAL HIGH (ref 150–400)
RBC: 6.4 MIL/uL — ABNORMAL HIGH (ref 3.87–5.11)
RDW: 25.2 % — ABNORMAL HIGH (ref 11.5–15.5)
WBC Count: 12.5 10*3/uL — ABNORMAL HIGH (ref 4.0–10.5)
nRBC: 0 % (ref 0.0–0.2)

## 2023-07-13 MED ORDER — LOKELMA 5 G PO PACK
5.0000 g | PACK | ORAL | 0 refills | Status: DC
  Filled 2023-07-13: qty 11, 25d supply, fill #0

## 2023-07-13 NOTE — Progress Notes (Signed)
Hematology and Oncology Follow Up Visit  Katrina Walters [id number] 1956-08-03 67 y.o. 07/13/2023   Principle Diagnosis:  Polycythemia Vera -- JAK2 (+)   Current Therapy:        Phlebotomy to maintain HCT below 45% Aspirin 81 mg PO daily               Interim History:  Katrina Walters is here today with her husband for follow-up.    Today she reports that she is doing ok.  She had a influenza vaccine given 10 days ago. Her arm is still bruised and sore in this area.   Since our last visit she did have her feet checked out and fortunately there were no concerns.    At her last visit she discussed being diagnosed with an H. Pylori infection. She was unable to be treated for this due to medication intolerance. She is being referred to [facility] by her PCP for further management. They are still waiting on her phone call from [facility]. She reports occasional shortness of breath but no chest pain.  Denies nausea and vomiting.  Weight is overall stable.  No bleeding reported.  Weight has trended down- suspected to be due to diet changes. She reports no new bone pain or unintentional weight loss.   Wt Readings from Last 3 Encounters:  07/13/23 101 lb (45.8 kg)  06/24/23 100 lb (45.4 kg)  06/22/23 99 lb 8 oz (45.1 kg)   ECOG Performance Status: 1 - Symptomatic but completely ambulatory  Medications:  Allergies as of 07/13/2023       Reactions   Amoxicillin    Had fever with flu like symptoms. Has patient had a PCN reaction causing immediate rash, facial/tongue/throat swelling, SOB or lightheadedness with hypotension: no Has patient had a PCN reaction causing severe rash involving mucus membranes or skin necrosis: no Has patient had a PCN reaction that required hospitalization no Has patient had a PCN reaction occurring within the last 10 years: yes If all of the above answers are "NO", then may proceed with Cephalosporin use.   Pravastatin Itching        Medication List        Accurate as of  July 13, 2023 11:12 AM. If you have any questions, ask your nurse or doctor.          aspirin 81 MG chewable tablet Chew by mouth daily.   BENEFIBER DRINK MIX PO Take by mouth daily at 6 (six) AM.   Bismuth 262 MG Chew Chew 524 mg by mouth in the morning, at noon, in the evening, and at bedtime.   colchicine 0.6 MG tablet Take 1 tablet (0.6 mg total) by mouth 2 (two) times daily.   Cranberry-Vitamin C 250-60 MG Caps Take 2 tablets by mouth daily.   D-MANNOSE PO Take 2 tablets by mouth daily.   diclofenac 75 MG EC tablet Commonly known as: VOLTAREN Take 1 tablet (75 mg total) by mouth 2 (two) times daily.   diphenhydrAMINE 25 MG tablet Commonly known as: BENADRYL Take 25 mg by mouth at bedtime.   ezetimibe 10 MG tablet Commonly known as: ZETIA Take 1 tablet (10 mg total) by mouth daily. Please keep scheduled appointment for additional refills.   FIBER PO Take 2 tablets by mouth daily.   levocetirizine 5 MG tablet Commonly known as: XYZAL Take 1 tablet (5 mg total) by mouth every evening.   linaclotide 290 MCG Caps capsule Commonly known as: LINZESS Take 1 capsule (290 mcg  total) by mouth daily before breakfast.   mupirocin ointment 2 % Commonly known as: BACTROBAN Apply 1 Application topically 2 (two) times daily.   nitazoxanide 500 MG tablet Commonly known as: ALINIA Take 1 tablet (500 mg total) by mouth 2 (two) times daily with a meal.   nitrofurantoin (macrocrystal-monohydrate) 100 MG capsule Commonly known as: MACROBID Take 1 tablet by mouth twice a day for 5 days.   NON FORMULARY Take 0.5 tablets by mouth at bedtime. CBD oil - for pain Gummies   omeprazole 40 MG capsule Commonly known as: PRILOSEC TAKE 1 CAPSULE (40 MG TOTAL) BY MOUTH DAILY.   ondansetron 4 MG tablet Commonly known as: ZOFRAN Take 1 tablet (4 mg total) by mouth every 8 (eight) hours as needed for nausea or vomiting.   Premarin vaginal cream Generic drug: conjugated  estrogens PLACE 0.25 APPLICATORFULS VAGINALLY SEE ADMIN INSTRUCTIONS. INSERT 0.5 GRAMS VAGINALLY EVERY OTHER NIGHT   Prolia 60 MG/ML Sosy injection Generic drug: denosumab INJECT 1 SYRINGE UNDER THE SKIN ONCE EVERY 6 MONTHS What changed: See the new instructions. Changed by: Esperanza Richters   rosuvastatin 10 MG tablet Commonly known as: CRESTOR Take 1 tablet (10 mg total) by mouth daily.   SPS 15 GM/60ML suspension Generic drug: sodium polystyrene Take 60 mLs (15 g total) by mouth daily for 4 days   SYSTANE OP Place 1 drop into both eyes in the morning and at bedtime.        Allergies:  Allergies  Allergen Reactions   Amoxicillin     Had fever with flu like symptoms. Has patient had a PCN reaction causing immediate rash, facial/tongue/throat swelling, SOB or lightheadedness with hypotension: no Has patient had a PCN reaction causing severe rash involving mucus membranes or skin necrosis: no Has patient had a PCN reaction that required hospitalization no Has patient had a PCN reaction occurring within the last 10 years: yes If all of the above answers are "NO", then may proceed with Cephalosporin use.   Pravastatin Itching    Past Medical History, Surgical history, Social history, and Family History were reviewed and updated.  Review of Systems: All other 10 point review of systems is negative.   Physical Exam:  height is 5\' 3"  (1.6 m) and weight is 101 lb (45.8 kg). Her oral temperature is 97.7 F (36.5 C). Her blood pressure is 119/72 and her pulse is 70. Her respiration is 18 and oxygen saturation is 100%.   Wt Readings from Last 3 Encounters:  07/13/23 101 lb (45.8 kg)  06/24/23 100 lb (45.4 kg)  06/22/23 99 lb 8 oz (45.1 kg)   Constitutional: Thin  Ocular: Sclerae unicteric, pupils equal, round and reactive to light Ear-nose-throat: Oropharynx clear, dentition fair Lymphatic: No cervical or supraclavicular adenopathy Lungs no rales or rhonchi, good excursion  bilaterally Heart regular rate and rhythm, no murmur appreciated. Bilateral feet are cooler to touch with 1+ PD bilaterally. Mild dusky hue of the distal right foot without edema, tenderness. Remaining toenails with hyperkeratosis.  Abd soft, nontender, positive bowel sounds MSK no focal spinal tenderness, no joint edema Neuro: non-focal, well-oriented, appropriate affect Skin: Resolving quarter size ecchymosis of the left upper arm. No palpable hematoma.    Lab Results  Component Value Date   WBC 12.5 (H) 07/13/2023   HGB 11.6 (L) 07/13/2023   HCT 42.1 07/13/2023   MCV 65.8 (L) 07/13/2023   PLT 435 (H) 07/13/2023   Lab Results  Component Value Date   FERRITIN 6 (  L) 06/12/2023   IRON 31 06/12/2023   TIBC 440 06/12/2023   UIBC 409 06/12/2023   IRONPCTSAT 7 (L) 06/12/2023   Lab Results  Component Value Date   RETICCTPCT 1.5 11/24/2022   RBC 6.40 (H) 07/13/2023   No results found for: "KPAFRELGTCHN", "LAMBDASER", "KAPLAMBRATIO" No results found for: "IGGSERUM", "IGA", "IGMSERUM" No results found for: "TOTALPROTELP", "ALBUMINELP", "A1GS", "A2GS", "BETS", "BETA2SER", "GAMS", "MSPIKE", "SPEI"   Chemistry      Component Value Date/Time   NA 137 07/13/2023 0923   NA 139 04/15/2021 1027   K 5.6 (H) 07/13/2023 0923   CL 101 07/13/2023 0923   CO2 30 07/13/2023 0923   BUN 14 07/13/2023 0923   BUN 14 04/15/2021 1027   CREATININE 0.78 07/13/2023 0923   CREATININE 0.84 07/14/2016 0914      Component Value Date/Time   CALCIUM 9.5 07/13/2023 0923   ALKPHOS 64 07/13/2023 0923   AST 22 07/13/2023 0923   ALT 16 07/13/2023 0923   BILITOT 0.7 07/13/2023 0981      Encounter Diagnoses  Name Primary?   Polycythemia vera (HCC) Yes   Iron deficiency anemia, unspecified iron deficiency anemia type    Hyperkalemia     Impression and Plan: Ms. Frost is a very pleasant 67 yo caucasian female with polycythemia vera, JAK 2 positive. Plan is to have her come in every 4 weeks for lab and  possible phlebotomy if hematocrit is above 45%.   May have some tendonitis- I have recommended a heating pad on the area. She can also discuss with ortho/PCP if pain does not continue to improve.   Her hematocrit is 42.1%  today and will hold off on phlebotomy. She also has a chronically elevated WBC and elevated platelets. Elevated platelets suspected to be due to iron deficiency s/p phlebotomy for her PV management. Neutrophilia likely secondary to her PV thought could be related to her current H. Pylori infection. She is not having any new bone pains, unintentional weight loss or night sweats. We will watch her CBC  closely and obtain a BMB if levels suddenly worsen.   No phlebotomy today RTC 1 month labs, +- phlebotomy RTC 2 months labs, +- phlebotomy RTC 3 months APP, labs, +- phlebotomy -[place]    Rushie Chestnut, PA-C 11/4/202411:12 AM

## 2023-07-13 NOTE — Addendum Note (Signed)
Addended by: Gwenevere Abbot on: 07/13/2023 01:02 PM   Modules accepted: Orders

## 2023-07-13 NOTE — Addendum Note (Signed)
Addended by: Gwenevere Abbot on: 07/13/2023 04:05 PM   Modules accepted: Orders

## 2023-07-14 ENCOUNTER — Telehealth: Payer: Self-pay | Admitting: Medical

## 2023-07-14 LAB — THYROID PANEL WITH TSH
Free Thyroxine Index: 1.8 (ref 1.2–4.9)
T3 Uptake Ratio: 27 % (ref 24–39)
T4, Total: 6.5 ug/dL (ref 4.5–12.0)
TSH: 1.9 u[IU]/mL (ref 0.450–4.500)

## 2023-07-14 NOTE — Telephone Encounter (Signed)
[facility] called to verify if pt Prolia should be going to pt home address or to our office. Please call and advise

## 2023-07-15 NOTE — Telephone Encounter (Signed)
Spoke with Katrina Walters , made her aware medication is to come to office . Verified address three times with rep , expected delivery date 07/22/23

## 2023-07-18 ENCOUNTER — Encounter: Payer: Self-pay | Admitting: Medical

## 2023-07-20 MED ORDER — FAMOTIDINE 20 MG PO TABS: 20.0000 mg | ORAL_TABLET | Freq: Every day | ORAL | 0 refills | Status: DC

## 2023-07-20 NOTE — Telephone Encounter (Signed)
On review of chart pt has had endoscopy. H pylori in past but did not see history of barrets esophagus documented. She has osteoporosis and has been on ppi. She wants to try famotadine alone due concern. . Will send back message advising ok but to follow up with me in one month to see if controlling symptoms. Sooner if needed. Total time on review of chart and messaging 10 minutes.

## 2023-07-22 ENCOUNTER — Other Ambulatory Visit: Payer: Self-pay

## 2023-07-22 ENCOUNTER — Ambulatory Visit (INDEPENDENT_AMBULATORY_CARE_PROVIDER_SITE_OTHER): Payer: Medicare Other | Admitting: Medical

## 2023-07-22 VITALS — BP 124/68 | HR 82 | Temp 98.0°F | Resp 16 | Ht 63.0 in | Wt 101.2 lb

## 2023-07-22 DIAGNOSIS — E875 Hyperkalemia: Secondary | ICD-10-CM | POA: Diagnosis not present

## 2023-07-22 DIAGNOSIS — R739 Hyperglycemia, unspecified: Secondary | ICD-10-CM

## 2023-07-22 DIAGNOSIS — M810 Age-related osteoporosis without current pathological fracture: Secondary | ICD-10-CM

## 2023-07-22 DIAGNOSIS — K219 Gastro-esophageal reflux disease without esophagitis: Secondary | ICD-10-CM

## 2023-07-22 DIAGNOSIS — A048 Other specified bacterial intestinal infections: Secondary | ICD-10-CM

## 2023-07-22 DIAGNOSIS — G4700 Insomnia, unspecified: Secondary | ICD-10-CM

## 2023-07-22 MED ORDER — TRAZODONE HCL 50 MG PO TABS: 25.0000 mg | ORAL_TABLET | Freq: Every evening | ORAL | 3 refills | Status: DC | PRN

## 2023-07-22 MED ORDER — DENOSUMAB 60 MG/ML ~~LOC~~ SOSY
60.0000 mg | PREFILLED_SYRINGE | Freq: Once | SUBCUTANEOUS | Status: AC
  Administered 2023-08-19: 60 mg via SUBCUTANEOUS

## 2023-07-22 NOTE — Progress Notes (Signed)
Subjective:    Patient ID: Katrina Walters, female    DOB: 11-Jul-1956, 67 y.o.   MRN: [id number]  HPI   History of Present Illness          Discussed the use of AI scribe software for clinical note transcription with the patient, who gave verbal consent to proceed.  History of Present Illness   The patient, with a history of gastroesophageal reflux disease (GERD), osteoporosis, and H. Pylori infection, presents with concerns about her current treatment regimen. She has been experiencing persistent reflux symptoms, which have been managed with a proton pump inhibitor. However, due to concerns about the potential contribution of this medication to her osteoporosis, she has switched to famotidine, an H2 blocker. She reports some apprehension about this change due to the severity of her reflux symptoms.   In addition to her GERD and osteoporosis, the patient has been diagnosed with H. Pylori infection. She has been unable to complete the recommended antibiotic treatment due to severe gastrointestinal side effects. She reports that she has sought care from multiple specialists, including infectious disease experts, but has been told that there are no alternative treatments available.  The patient also has a history of hyperkalemia, which has been fluctuating despite treatment. She has recently started a new medication, Lokelma, and has taken three doses so far. She expresses concern about her potassium levels, which have been trending upward.  Lastly, the patient mentions concerns about her diet and blood sugar levels. She reports a love for bread and struggle with balancing her carbohydrate intake. She expresses fear about developing prediabetes if she consumes too much sugar, but also worries about hypoglycemia if she restricts her sugar intake too much.        Review of Systems  Constitutional:  Negative for chills and fatigue.  Respiratory:  Negative for cough, chest tightness and wheezing.    Cardiovascular:  Negative for chest pain and palpitations.  Gastrointestinal:  Positive for abdominal pain.       Reflux.  Genitourinary:  Negative for dysuria and flank pain.  Musculoskeletal:  Negative for back pain.  Skin:  Negative for rash.  Neurological:  Negative for dizziness, weakness and light-headedness.  Hematological:  Negative for adenopathy. Does not bruise/bleed easily.  Psychiatric/Behavioral:  Positive for sleep disturbance. Negative for behavioral problems and dysphoric mood. The patient is not nervous/anxious.     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 34 years in [place], managed on colchicine   Osteoporosis 09/2016   Peripheral arterial disease (HCC)    Polycythemia vera (HCC)    PONV (postoperative nausea and vomiting)      Social History   Socioeconomic History   Marital status: Married    Spouse name: Not on file   Number of children: 1   Years of education: Not on file   Highest education level: GED or equivalent  Occupational History   Occupation: unemployed at the time  Tobacco Use   Smoking status: Never   Smokeless tobacco: Never  Vaping Use   Vaping status: Never Used  Substance and Sexual Activity   Alcohol use: Yes  Alcohol/week: 1.0 standard drink of alcohol    Types: 1 Glasses of wine per week    Comment: occassionally   Drug use: No    Comment: CBD gummies   Sexual activity: Yes  Other Topics Concern   Not on file  Social History Narrative   Not on file   Social Determinants of Health   Financial Resource Strain: Medium Risk (07/21/2023)   Overall Financial Resource Strain (CARDIA)    Difficulty of Paying Living Expenses: Somewhat hard  Food Insecurity:  Food Insecurity Present (07/21/2023)   Hunger Vital Sign    Worried About Running Out of Food in the Last Year: Sometimes true    Ran Out of Food in the Last Year: Sometimes true  Transportation Needs: No Transportation Needs (07/21/2023)   PRAPARE - Administrator, Civil Service (Medical): No    Lack of Transportation (Non-Medical): No  Physical Activity: Insufficiently Active (07/21/2023)   Exercise Vital Sign    Days of Exercise per Week: 3 days    Minutes of Exercise per Session: 20 min  Stress: Stress Concern Present (07/21/2023)   [employer] of Occupational Health - Occupational Stress Questionnaire    Feeling of Stress : To some extent  Social Connections: Moderately Integrated (07/21/2023)   Social Connection and Isolation Panel [NHANES]    Frequency of Communication with Friends and Family: More than three times a week    Frequency of Social Gatherings with Friends and Family: Once a week    Attends Religious Services: 1 to 4 times per year    Active Member of [employer] or Organizations: No    Attends Banker Meetings: Never    Marital Status: Married  Recent Concern: Social Connections - Moderately Isolated (06/15/2023)   Social Connection and Isolation Panel [NHANES]    Frequency of Communication with Friends and Family: More than three times a week    Frequency of Social Gatherings with Friends and Family: Once a week    Attends Religious Services: Never    Database administrator or Organizations: No    Attends Banker Meetings: Never    Marital Status: Married  Catering manager Violence: Not At Risk (02/23/2023)   Humiliation, Afraid, Rape, and Kick questionnaire    Fear of Current or Ex-Partner: No    Emotionally Abused: No    Physically Abused: No    Sexually Abused: No    Past Surgical History:  Procedure Laterality Date   ABDOMINAL HYSTERECTOMY  08/2019   ANAL RECTAL MANOMETRY N/A 11/12/2020   Procedure: ANO RECTAL  MANOMETRY;  Surgeon: Shellia Cleverly, DO;  Location: [facility];  Service: Gastroenterology;  Laterality: N/A;   BLADDER SURGERY  08/2019   BREAST REDUCTION SURGERY  2006   CARDIAC CATHETERIZATION N/A 07/17/2016   Procedure: Left Heart Cath and Coronary Angiography;  Surgeon: Kathleene Hazel, MD;  [facility] INVASIVE CV LAB;  Service: Cardiovascular;  Laterality: N/A;   CATARACT EXTRACTION W/ INTRAOCULAR LENS IMPLANT Bilateral    COLONOSCOPY  2015   CORONARY ARTERY BYPASS GRAFT N/A 06/04/2021   Procedure: CORONARY ARTERY BYPASS GRAFTING (CABG), ON PUMP, TIMES FOUR, USING LEFT INTERNAL MAMMARY ARTERY, LEFT RADIAL ARTERY (HARVESTED OPEN), AND RIGHT GREATER SAPHENOUS VEIN (HARVESTED ENDOSCOPICALLY);  Surgeon: Corliss Skains, MD;  Location: [facility] OR;  Service: Open Heart Surgery;  Laterality: N/A;   EYE SURGERY     LEFT HEART CATH AND CORONARY ANGIOGRAPHY N/A 04/22/2021   Procedure: LEFT HEART CATH  AND CORONARY ANGIOGRAPHY;  Surgeon: Lyn Records, MD;  Location: [facility] INVASIVE CV LAB;  Service: Cardiovascular;  Laterality: N/A;   POLYPECTOMY  2015   RADIAL ARTERY HARVEST Left 06/04/2021   Procedure: RADIAL ARTERY HARVEST;  Surgeon: Corliss Skains, MD;  Location: [facility] OR;  Service: Open Heart Surgery;  Laterality: Left;   TEE WITHOUT CARDIOVERSION N/A 06/04/2021   Procedure: TRANSESOPHAGEAL ECHOCARDIOGRAM (TEE);  Surgeon: Corliss Skains, MD;  Location: [facility] OR;  Service: Open Heart Surgery;  Laterality: N/A;   TONSILLECTOMY      Family History  Problem Relation Age of Onset   Heart disease Mother        died at 45 of heart attack   Hypertension Mother    Heart attack Mother    Heart disease Father        had heart attack at age 65   Heart attack Father    Rectal cancer Father 33   Hearing loss Father    Cancer Paternal Aunt    Stomach cancer Paternal Grandmother    Colon cancer Neg Hx    Colon polyps Neg Hx    Esophageal cancer Neg Hx     Allergies  Allergen  Reactions   Amoxicillin     Had fever with flu like symptoms. Has patient had a PCN reaction causing immediate rash, facial/tongue/throat swelling, SOB or lightheadedness with hypotension: no Has patient had a PCN reaction causing severe rash involving mucus membranes or skin necrosis: no Has patient had a PCN reaction that required hospitalization no Has patient had a PCN reaction occurring within the last 10 years: yes If all of the above answers are "NO", then may proceed with Cephalosporin use.   Pravastatin Itching    Current Outpatient Medications on File Prior to Visit  Medication Sig Dispense Refill   aspirin 81 MG chewable tablet Chew by mouth daily.     Bismuth 262 MG CHEW Chew 524 mg by mouth in the morning, at noon, in the evening, and at bedtime. 112 tablet 0   colchicine 0.6 MG tablet Take 1 tablet (0.6 mg total) by mouth 2 (two) times daily. 180 tablet 3   Cranberry-Vitamin C 250-60 MG CAPS Take 2 tablets by mouth daily.     D-MANNOSE PO Take 2 tablets by mouth daily.     denosumab (PROLIA) 60 MG/ML SOSY injection INJECT 1 SYRINGE UNDER THE SKIN ONCE EVERY 6 MONTHS 1 mL 1   diclofenac (VOLTAREN) 75 MG EC tablet Take 1 tablet (75 mg total) by mouth 2 (two) times daily. 20 tablet 0   diphenhydrAMINE (BENADRYL) 25 MG tablet Take 25 mg by mouth at bedtime.     ezetimibe (ZETIA) 10 MG tablet Take 1 tablet (10 mg total) by mouth daily. Please keep scheduled appointment for additional refills. 90 tablet 3   famotidine (PEPCID) 20 MG tablet Take 1 tablet (20 mg total) by mouth daily. 60 tablet 0   FIBER PO Take 2 tablets by mouth daily.     levocetirizine (XYZAL) 5 MG tablet Take 1 tablet (5 mg total) by mouth every evening. 90 tablet 1   linaclotide (LINZESS) 290 MCG CAPS capsule Take 1 capsule (290 mcg total) by mouth daily before breakfast. 90 capsule 5   mupirocin ointment (BACTROBAN) 2 % Apply 1 Application topically 2 (two) times daily. 22 g 0   nitazoxanide (ALINIA) 500 MG  tablet Take 1 tablet (500 mg total) by mouth 2 (two) times daily with a meal.  20 tablet 0   nitrofurantoin, macrocrystal-monohydrate, (MACROBID) 100 MG capsule Take 1 tablet by mouth twice a day for 5 days. 10 capsule 0   NON FORMULARY Take 0.5 tablets by mouth at bedtime. CBD oil - for pain Gummies     omeprazole (PRILOSEC) 40 MG capsule TAKE 1 CAPSULE (40 MG TOTAL) BY MOUTH DAILY. 90 capsule 1   ondansetron (ZOFRAN) 4 MG tablet Take 1 tablet (4 mg total) by mouth every 8 (eight) hours as needed for nausea or vomiting. 30 tablet 1   Polyethyl Glycol-Propyl Glycol (SYSTANE OP) Place 1 drop into both eyes in the morning and at bedtime.     PREMARIN vaginal cream PLACE 0.25 APPLICATORFULS VAGINALLY SEE ADMIN INSTRUCTIONS. INSERT 0.5 GRAMS VAGINALLY EVERY OTHER NIGHT 30 g 12   rosuvastatin (CRESTOR) 10 MG tablet Take 1 tablet (10 mg total) by mouth daily. 90 tablet 3   sodium zirconium cyclosilicate (LOKELMA) 5 g packet Take 5 g by mouth 3 (three) times a week on Monday, Wednesday, and Fridays. 11 packet 0   Wheat Dextrin (BENEFIBER DRINK MIX PO) Take by mouth daily at 6 (six) AM.     No current facility-administered medications on file prior to visit.    BP 124/68 (BP Location: Left Arm, Patient Position: Sitting, Cuff Size: Normal)   Pulse 82   Temp 98 F (36.7 C) (Oral)   Resp 16   Ht 5\' 3"  (1.6 m)   Wt 101 lb 3.2 oz (45.9 kg)   LMP  (LMP Unknown)   SpO2 100%   BMI 17.93 kg/m        Objective:   Physical Exam General- No acute distress. Pleasant patient. Neck- Full range of motion, no jvd Lungs- Clear, even and unlabored. Heart- regular rate and rhythm. Neurologic- CNII- XII grossly intact.        Assessment & Plan:   Assessment and Plan    Gastroesophageal Reflux Disease (GERD) Concerns about proton pump inhibitor use due to osteoporosis. Switched to famotidine, an H2 blocker, with some symptom control. No evidence of Barrett's esophagitis on previous studies. -Continue  famotidine twice daily for symptom control. recently only used 1 tab daily. hopefully will improve with twice daily.   H. Pylori Infection Unable to tolerate oral antibiotic treatment. Previous consultation with local infectious disease specialists did not yield alternative treatment options. -Refer to [facility] for further evaluation and potential alternative treatment options.   [facility] [address] [place], [place] [address] [phone]   Hyperkalemia Recurrent episodes of elevated potassium. Recently started on Lokelma. -Continue Lokelma as prescribed. -Check potassium level on 07/24/2023.  Prediabetes Concerns about diet and carbohydrate intake. Last A1C was 120 (normal range). -Reduce processed carbohydrate intake by half. -Check metabolic panel including glucose level on 07/24/2023.   Follow up date to be determined after lab review.        Esperanza Richters, PA-C

## 2023-07-22 NOTE — Patient Instructions (Signed)
Gastroesophageal Reflux Disease (GERD) Concerns about proton pump inhibitor use due to osteoporosis. Switched to famotidine, an H2 blocker, with some symptom control. No evidence of Barrett's esophagitis on previous studies. -Continue famotidine twice daily for symptom control. recently only used 1 tab daily. hopefully will improve with twice daily.   H. Pylori Infection Unable to tolerate oral antibiotic treatment. Previous consultation with local infectious disease specialists did not yield alternative treatment options. -Refer to [facility] for further evaluation and potential alternative treatment options.   [facility] [address] [place], [place] [address] [phone]   Hyperkalemia Recurrent episodes of elevated potassium. Recently started on Lokelma. -Continue Lokelma as prescribed. -Check potassium level on 07/24/2023.  Prediabetes Concerns about diet and carbohydrate intake. Last A1C was 120 (normal range). -Reduce processed carbohydrate intake by half. -Check metabolic panel including glucose level on 07/24/2023.   Follow up date to be determined after lab review.

## 2023-07-22 NOTE — Telephone Encounter (Signed)
CAM placed.  Pt due on/after 08/13/23.   Rx already received in the refrigerator, pt not due nor have benefits been ran so she is unable to receive today.

## 2023-07-24 ENCOUNTER — Other Ambulatory Visit (INDEPENDENT_AMBULATORY_CARE_PROVIDER_SITE_OTHER): Payer: Medicare Other

## 2023-07-24 DIAGNOSIS — E875 Hyperkalemia: Secondary | ICD-10-CM

## 2023-07-24 LAB — COMPREHENSIVE METABOLIC PANEL
ALT: 17 U/L (ref 0–35)
AST: 24 U/L (ref 0–37)
Albumin: 4.3 g/dL (ref 3.5–5.2)
Alkaline Phosphatase: 77 U/L (ref 39–117)
BUN: 14 mg/dL (ref 6–23)
CO2: 27 meq/L (ref 19–32)
Calcium: 9.6 mg/dL (ref 8.4–10.5)
Chloride: 101 meq/L (ref 96–112)
Creatinine, Ser: 0.82 mg/dL (ref 0.40–1.20)
GFR: 74.54 mL/min (ref >60.00)
Glucose, Bld: 63 mg/dL — ABNORMAL LOW (ref 70–99)
Potassium: 5 meq/L (ref 3.5–5.1)
Sodium: 135 meq/L (ref 135–145)
Total Bilirubin: 0.6 mg/dL (ref 0.2–1.2)
Total Protein: 7.2 g/dL (ref 6.0–8.3)

## 2023-07-26 ENCOUNTER — Encounter: Payer: Self-pay | Admitting: Medical

## 2023-07-26 MED ORDER — LOKELMA 5 G PO PACK: PACK | ORAL | 0 refills | Status: DC

## 2023-07-26 NOTE — Addendum Note (Signed)
Addended by: Gwenevere Abbot on: 07/26/2023 07:56 PM   Modules accepted: Orders

## 2023-07-27 ENCOUNTER — Other Ambulatory Visit (HOSPITAL_COMMUNITY): Payer: Self-pay

## 2023-07-30 ENCOUNTER — Other Ambulatory Visit (HOSPITAL_COMMUNITY)
Admission: RE | Admit: 2023-07-30 | Discharge: 2023-07-30 | Disposition: A | Discharge status: Home / Self Care | Payer: Medicare Other | Source: Ambulatory Visit | Attending: Family Medicine | Admitting: Family Medicine

## 2023-07-30 ENCOUNTER — Encounter: Payer: Self-pay | Admitting: Family Medicine

## 2023-07-30 ENCOUNTER — Ambulatory Visit: Payer: Medicare Other | Admitting: Family Medicine

## 2023-07-30 VITALS — BP 108/69 | HR 83 | Ht 63.0 in | Wt 98.0 lb

## 2023-07-30 DIAGNOSIS — Z01419 Encounter for gynecological examination (general) (routine) without abnormal findings: Secondary | ICD-10-CM

## 2023-07-30 DIAGNOSIS — R300 Dysuria: Secondary | ICD-10-CM | POA: Diagnosis not present

## 2023-07-30 DIAGNOSIS — Z1339 Encounter for screening examination for other mental health and behavioral disorders: Secondary | ICD-10-CM | POA: Diagnosis not present

## 2023-07-30 DIAGNOSIS — N898 Other specified noninflammatory disorders of vagina: Secondary | ICD-10-CM | POA: Diagnosis not present

## 2023-07-30 MED ORDER — IMVEXXY MAINTENANCE PACK 4 MCG VA INST: 4.0000 ug | VAGINAL_INSERT | VAGINAL | Status: DC

## 2023-07-30 NOTE — Addendum Note (Signed)
Addended by: Leola Brazil on: 07/30/2023 02:06 PM   Modules accepted: Orders

## 2023-07-30 NOTE — Progress Notes (Signed)
ANNUAL EXAM Patient name: Katrina Walters MRN [id number]  Date of birth: 1955-11-13 Chief Complaint:   Annual Exam  History of Present Illness:   Katrina Walters is a 67 y.o.  [phone]  female  being seen today for a routine annual exam.  Current complaints: vaginal irrigation, frequent UTI  No LMP recorded (lmp unknown). Patient is postmenopausal.    Last pap n/a. Last mammogram: 01/2023. Results were: normal. Family h/o breast cancer: no Last colonoscopy:      07/30/2023   11:32 AM 05/14/2023   10:51 AM 02/23/2023   11:08 AM 12/24/2021    2:25 PM 02/01/2021   11:05 AM  Depression screen PHQ 2/9  Decreased Interest 0 0 0 0 0  Down, Depressed, Hopeless 0 0 0 0 0  PHQ - 2 Score 0 0 0 0 0  Altered sleeping 1 0     Tired, decreased energy 1 0     Change in appetite 0 0     Feeling bad or failure about yourself  0 0     Trouble concentrating 0 0     Moving slowly or fidgety/restless 0 0     Suicidal thoughts 0      PHQ-9 Score 2 0     Difficult doing work/chores  Not difficult at all           07/30/2023   11:33 AM  GAD 7 : Generalized Anxiety Score  Nervous, Anxious, on Edge 0  Control/stop worrying 0  Worry too much - different things 1  Trouble relaxing 0  Restless 0  Easily annoyed or irritable 0  Afraid - awful might happen 0  Total GAD 7 Score 1     Review of Systems:   Pertinent items are noted in HPI Denies any headaches, blurred vision, fatigue, shortness of breath, chest pain, abdominal pain, abnormal vaginal discharge/itching/odor/irritation, problems with periods, bowel movements, urination, or intercourse unless otherwise stated above. Pertinent History Reviewed:  Reviewed past medical,surgical, social and family history.  Reviewed problem list, medications and allergies. Physical Assessment:   Vitals:   07/30/23 1123  BP: 108/69  Pulse: 83  Weight: 98 lb (44.5 kg)  Height: 5\' 3"  (1.6 m)  Body mass index is 17.36 kg/m.        Physical Examination:    General appearance - well appearing, and in no distress  Mental status - alert, oriented to person, place, and time  Psych:  She has a normal mood and affect  Skin - warm and dry, normal color, no suspicious lesions noted  Chest - effort normal, all lung fields clear to auscultation bilaterally  Heart - normal rate and regular rhythm  Neck:  midline trachea, no thyromegaly or nodules  Breasts - breasts appear normal, no suspicious masses, no skin or nipple changes or axillary nodes  Abdomen - soft, nontender, nondistended, no masses or organomegaly  Pelvic - VULVA: normal appearing vulva with no masses, tenderness or lesions  VAGINA: normal appearing vagina with normal color and discharge, no lesions  CERVIX: absent  UTERUS: absent   ADNEXA: No adnexal masses or tenderness noted.  Extremities:  No swelling or varicosities noted  Chaperone present for exam  Assessment & Plan:  1. Well woman exam with routine gynecological exam  2. Vaginal irritation Check culture  3. Burning with urination Will trial estrogen suppository in place of vaginal estrogen cream to see if this is more effective F/u in 6 weeks   No orders of  the defined types were placed in this encounter.   Meds: No orders of the defined types were placed in this encounter.   Follow-up: No follow-ups on file.  Levie Heritage, DO 07/30/2023 12:07 PM

## 2023-07-31 LAB — CERVICOVAGINAL ANCILLARY ONLY
Bacterial Vaginitis (gardnerella): NEGATIVE
Candida Glabrata: NEGATIVE
Candida Vaginitis: NEGATIVE
Chlamydia: NEGATIVE
Comment: NEGATIVE
Comment: NEGATIVE
Comment: NEGATIVE
Comment: NEGATIVE
Comment: NEGATIVE
Comment: NORMAL
Neisseria Gonorrhea: NEGATIVE
Trichomonas: POSITIVE — AB

## 2023-08-03 ENCOUNTER — Telehealth: Payer: Self-pay

## 2023-08-03 MED ORDER — METRONIDAZOLE 500 MG PO TABS: 500.0000 mg | ORAL_TABLET | Freq: Two times a day (BID) | ORAL | 0 refills | Status: DC

## 2023-08-03 NOTE — Addendum Note (Signed)
Addended by: Levie Heritage on: 08/03/2023 09:49 AM   Modules accepted: Orders

## 2023-08-03 NOTE — Telephone Encounter (Signed)
Attempted to contact patient, no answer. Left vm asking patient to return my call.

## 2023-08-03 NOTE — Telephone Encounter (Signed)
Spoke with patient and informed patient of lab results. My Chart message send to patient to recap phone call per patients request.

## 2023-08-10 ENCOUNTER — Ambulatory Visit (INDEPENDENT_AMBULATORY_CARE_PROVIDER_SITE_OTHER): Payer: Medicare Other

## 2023-08-10 ENCOUNTER — Other Ambulatory Visit (INDEPENDENT_AMBULATORY_CARE_PROVIDER_SITE_OTHER): Payer: Medicare Other

## 2023-08-10 DIAGNOSIS — H43813 Vitreous degeneration, bilateral: Secondary | ICD-10-CM | POA: Diagnosis not present

## 2023-08-10 DIAGNOSIS — Z113 Encounter for screening for infections with a predominantly sexual mode of transmission: Secondary | ICD-10-CM

## 2023-08-10 DIAGNOSIS — E875 Hyperkalemia: Secondary | ICD-10-CM | POA: Diagnosis not present

## 2023-08-10 LAB — COMPREHENSIVE METABOLIC PANEL
ALT: 21 U/L (ref 0–35)
AST: 27 U/L (ref 0–37)
Albumin: 4.2 g/dL (ref 3.5–5.2)
Alkaline Phosphatase: 75 U/L (ref 39–117)
BUN: 13 mg/dL (ref 6–23)
CO2: 26 meq/L (ref 19–32)
Calcium: 9.4 mg/dL (ref 8.4–10.5)
Chloride: 99 meq/L (ref 96–112)
Creatinine, Ser: 0.77 mg/dL (ref 0.40–1.20)
GFR: 80.35 mL/min (ref >60.00)
Glucose, Bld: 95 mg/dL (ref 70–99)
Potassium: 4.1 meq/L (ref 3.5–5.1)
Sodium: 131 meq/L — ABNORMAL LOW (ref 135–145)
Total Bilirubin: 0.7 mg/dL (ref 0.2–1.2)
Total Protein: 7.5 g/dL (ref 6.0–8.3)

## 2023-08-10 MED ORDER — LOKELMA 5 G PO PACK: PACK | ORAL | 0 refills | Status: DC

## 2023-08-10 NOTE — Addendum Note (Signed)
Addended by: Gwenevere Abbot on: 08/10/2023 07:25 PM   Modules accepted: Orders

## 2023-08-10 NOTE — Progress Notes (Signed)
Patient here for serum sti panel. Labs ordered and requisition handed to patient.

## 2023-08-11 LAB — HEPATITIS B SURFACE ANTIGEN: Hepatitis B Surface Ag: NEGATIVE

## 2023-08-11 LAB — HEPATITIS C ANTIBODY: Hep C Virus Ab: NONREACTIVE

## 2023-08-11 LAB — HIV ANTIBODY (ROUTINE TESTING W REFLEX): HIV Screen 4th Generation wRfx: NONREACTIVE

## 2023-08-11 LAB — RPR: RPR Ser Ql: NONREACTIVE

## 2023-08-12 ENCOUNTER — Ambulatory Visit: Payer: Medicare Other

## 2023-08-12 ENCOUNTER — Inpatient Hospital Stay: Payer: Medicare Other | Attending: Hematology & Oncology

## 2023-08-12 ENCOUNTER — Other Ambulatory Visit (HOSPITAL_COMMUNITY)
Admission: RE | Admit: 2023-08-12 | Discharge: 2023-08-12 | Disposition: A | Discharge status: Home / Self Care | Payer: Medicare Other | Source: Ambulatory Visit | Attending: Family Medicine | Admitting: Family Medicine

## 2023-08-12 ENCOUNTER — Inpatient Hospital Stay: Payer: Medicare Other

## 2023-08-12 VITALS — BP 130/71 | HR 76 | Wt 101.0 lb

## 2023-08-12 DIAGNOSIS — R300 Dysuria: Secondary | ICD-10-CM

## 2023-08-12 DIAGNOSIS — D45 Polycythemia vera: Secondary | ICD-10-CM | POA: Insufficient documentation

## 2023-08-12 DIAGNOSIS — Z8619 Personal history of other infectious and parasitic diseases: Secondary | ICD-10-CM | POA: Insufficient documentation

## 2023-08-12 DIAGNOSIS — D509 Iron deficiency anemia, unspecified: Secondary | ICD-10-CM

## 2023-08-12 LAB — POCT URINALYSIS DIPSTICK
Bilirubin, UA: NEGATIVE
Blood, UA: NEGATIVE
Glucose, UA: NEGATIVE
Ketones, UA: NEGATIVE
Leukocytes, UA: NEGATIVE
Nitrite, UA: NEGATIVE
Protein, UA: NEGATIVE
Spec Grav, UA: 1.025 (ref 1.010–1.025)
Urobilinogen, UA: 0.2 U/dL
pH, UA: 6.5 (ref 5.0–8.0)

## 2023-08-12 LAB — CBC WITH DIFFERENTIAL (CANCER CENTER ONLY)
Abs Immature Granulocytes: 0.13 10*3/uL — ABNORMAL HIGH (ref 0.00–0.07)
Basophils Absolute: 0.1 10*3/uL (ref 0.0–0.1)
Basophils Relative: 1 %
Eosinophils Absolute: 0.3 10*3/uL (ref 0.0–0.5)
Eosinophils Relative: 2 %
HCT: 42.2 % (ref 36.0–46.0)
Hemoglobin: 12 g/dL (ref 12.0–15.0)
Immature Granulocytes: 1 %
Lymphocytes Relative: 13 %
Lymphs Abs: 2 10*3/uL (ref 0.7–4.0)
MCH: 18.5 pg — ABNORMAL LOW (ref 26.0–34.0)
MCHC: 28.4 g/dL — ABNORMAL LOW (ref 30.0–36.0)
MCV: 65 fL — ABNORMAL LOW (ref 80.0–100.0)
Monocytes Absolute: 0.8 10*3/uL (ref 0.1–1.0)
Monocytes Relative: 5 %
Neutro Abs: 12 10*3/uL — ABNORMAL HIGH (ref 1.7–7.7)
Neutrophils Relative %: 78 %
Platelet Count: 461 10*3/uL — ABNORMAL HIGH (ref 150–400)
RBC: 6.49 MIL/uL — ABNORMAL HIGH (ref 3.87–5.11)
RDW: 23.9 % — ABNORMAL HIGH (ref 11.5–15.5)
WBC Count: 15.3 10*3/uL — ABNORMAL HIGH (ref 4.0–10.5)
nRBC: 0 % (ref 0.0–0.2)

## 2023-08-12 NOTE — Progress Notes (Signed)
SUBJECTIVE: Katrina Walters is a 67 y.o. female who complains of urinary frequency, urgency and dysuria x 3 days, without flank pain, fever, chills, or abnormal vaginal discharge or bleeding.   OBJECTIVE: Appears well, in no apparent distress.  Vital signs are normal. Urine dipstick shows negative for all components.    ASSESSMENT: Dysuria  PLAN: Self swab was also done at this visit.Call or return to clinic prn if these symptoms worsen or fail to improve as anticipated.

## 2023-08-14 ENCOUNTER — Encounter: Payer: Self-pay | Admitting: Family Medicine

## 2023-08-14 DIAGNOSIS — R300 Dysuria: Secondary | ICD-10-CM

## 2023-08-14 DIAGNOSIS — R3915 Urgency of urination: Secondary | ICD-10-CM

## 2023-08-14 LAB — CERVICOVAGINAL ANCILLARY ONLY
Bacterial Vaginitis (gardnerella): NEGATIVE
Candida Glabrata: NEGATIVE
Candida Vaginitis: NEGATIVE
Chlamydia: NEGATIVE
Comment: NEGATIVE
Comment: NEGATIVE
Comment: NEGATIVE
Comment: NEGATIVE
Comment: NEGATIVE
Comment: NORMAL
Neisseria Gonorrhea: NEGATIVE
Trichomonas: NEGATIVE

## 2023-08-14 LAB — URINE CULTURE

## 2023-08-17 ENCOUNTER — Other Ambulatory Visit: Payer: Self-pay | Admitting: Medical

## 2023-08-17 ENCOUNTER — Ambulatory Visit: Payer: Medicare Other | Admitting: Medical

## 2023-08-18 DIAGNOSIS — N390 Urinary tract infection, site not specified: Secondary | ICD-10-CM | POA: Diagnosis not present

## 2023-08-18 DIAGNOSIS — Z9189 Other specified personal risk factors, not elsewhere classified: Secondary | ICD-10-CM | POA: Diagnosis not present

## 2023-08-18 DIAGNOSIS — Z681 Body mass index (BMI) 19 or less, adult: Secondary | ICD-10-CM | POA: Diagnosis not present

## 2023-08-18 DIAGNOSIS — R300 Dysuria: Secondary | ICD-10-CM | POA: Diagnosis not present

## 2023-08-19 ENCOUNTER — Telehealth: Payer: Self-pay | Admitting: *Deleted

## 2023-08-19 ENCOUNTER — Ambulatory Visit: Payer: Medicare Other | Admitting: *Deleted

## 2023-08-19 DIAGNOSIS — M810 Age-related osteoporosis without current pathological fracture: Secondary | ICD-10-CM

## 2023-08-19 MED ORDER — DENOSUMAB 60 MG/ML ~~LOC~~ SOSY
60.0000 mg | PREFILLED_SYRINGE | Freq: Once | SUBCUTANEOUS | Status: AC
  Administered 2024-02-18: 60 mg via SUBCUTANEOUS

## 2023-08-19 MED ORDER — FLUCONAZOLE 150 MG PO TABS: 150.0000 mg | ORAL_TABLET | Freq: Once | ORAL | 0 refills | Status: AC

## 2023-08-19 MED ORDER — NITROFURANTOIN MONOHYD MACRO 100 MG PO CAPS: ORAL_CAPSULE | ORAL | 0 refills | Status: DC

## 2023-08-19 NOTE — Progress Notes (Signed)
Patient here for prolia injection per physicians orders.  Patient received from pharmacy,   Injection given left subq and patient tolerated well.

## 2023-08-19 NOTE — Telephone Encounter (Signed)
Prolia given 08/19/23 Next prolia due 02/18/24  CAM order placed

## 2023-09-10 ENCOUNTER — Encounter: Payer: Self-pay | Admitting: Family Medicine

## 2023-09-10 ENCOUNTER — Ambulatory Visit: Payer: Medicare Other | Admitting: Family Medicine

## 2023-09-10 VITALS — BP 111/62 | HR 92 | Ht 63.0 in | Wt 102.0 lb

## 2023-09-10 DIAGNOSIS — R300 Dysuria: Secondary | ICD-10-CM

## 2023-09-10 MED ORDER — PREMARIN 0.625 MG/GM VA CREA: 0.5000 g | TOPICAL_CREAM | VAGINAL | 12 refills | Status: DC

## 2023-09-10 NOTE — Progress Notes (Signed)
   Subjective:    Patient ID: Katrina Walters, female    DOB: 04-26-1956, 68 y.o.   MRN: [id number]  HPI Patient seen for follow-up of vaginal irritation and dysuria.  She did not tolerate the Imvexxy : She related that she had forgotten that she previously got significant reaction in the past that required steroids to treat.  She currently is getting dysuria symptoms every other day that vary in length of time on how long it lasts.  She does not feel like she empties her bladder, many times having to strain or press on her abdomen to fully empty.  Not been taking d-mannose.  She has also run out of her Premarin  and is not using that.  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   Review of Systems     Objective:   Physical Exam Vitals reviewed. Exam conducted with a chaperone present.  Constitutional:      Appearance: Normal appearance.  Pulmonary:     Effort: Pulmonary effort is normal.  Genitourinary:    General: Normal vulva.     Labia:        Right: No rash or tenderness.        Left: No rash or tenderness.      Urethra: No prolapse.     Comments: There is mild cystocele.  Neurological:     Mental Status: She is alert.  Psychiatric:        Mood and Affect: Mood normal.        Behavior: Behavior normal.        Thought Content: Thought content normal.        Judgment: Judgment normal.        Assessment & Plan:  1. Dysuria (Primary) Recommended that the patient restart D-mannose. Also recommended restarting premarin .  UA normal with no evidence of infection. Previous urine cultures did not result in a specific bacteria. Will refer to urogyn to rule out other etiologies. - Ambulatory referral to Urogynecology

## 2023-09-11 ENCOUNTER — Ambulatory Visit (INDEPENDENT_AMBULATORY_CARE_PROVIDER_SITE_OTHER): Payer: Medicare Other | Admitting: Medical

## 2023-09-11 ENCOUNTER — Other Ambulatory Visit: Payer: Self-pay | Admitting: Emergency Medicine

## 2023-09-11 ENCOUNTER — Encounter: Payer: Self-pay | Admitting: Medical

## 2023-09-11 ENCOUNTER — Other Ambulatory Visit: Payer: Self-pay | Admitting: Medical

## 2023-09-11 ENCOUNTER — Ambulatory Visit (HOSPITAL_BASED_OUTPATIENT_CLINIC_OR_DEPARTMENT_OTHER)
Admission: RE | Admit: 2023-09-11 | Discharge: 2023-09-11 | Disposition: A | Discharge status: Home / Self Care | Payer: Medicare Other | Source: Ambulatory Visit | Attending: Medical | Admitting: Medical

## 2023-09-11 VITALS — BP 116/60 | HR 82 | Temp 98.0°F | Resp 18 | Ht 63.0 in | Wt 98.8 lb

## 2023-09-11 DIAGNOSIS — K219 Gastro-esophageal reflux disease without esophagitis: Secondary | ICD-10-CM

## 2023-09-11 DIAGNOSIS — M1811 Unilateral primary osteoarthritis of first carpometacarpal joint, right hand: Secondary | ICD-10-CM | POA: Diagnosis not present

## 2023-09-11 DIAGNOSIS — R079 Chest pain, unspecified: Secondary | ICD-10-CM

## 2023-09-11 DIAGNOSIS — M79641 Pain in right hand: Secondary | ICD-10-CM

## 2023-09-11 DIAGNOSIS — L299 Pruritus, unspecified: Secondary | ICD-10-CM

## 2023-09-11 LAB — TROPONIN I (HIGH SENSITIVITY): High Sens Troponin I: 3 ng/L (ref 2–17)

## 2023-09-11 MED ORDER — FAMOTIDINE 20 MG PO TABS: 20.0000 mg | ORAL_TABLET | Freq: Every day | ORAL | 1 refills | Status: DC

## 2023-09-11 MED ORDER — TRIAMCINOLONE ACETONIDE 0.1 % EX CREA: 1.0000 | TOPICAL_CREAM | Freq: Two times a day (BID) | CUTANEOUS | 1 refills | Status: AC

## 2023-09-11 NOTE — Patient Instructions (Addendum)
 Right Hand Pain Pain localized to the right thumb for the past 10 days, possibly due to overuse(tenosynovitis) or arthritis. -Order right hand x-ray. -Advise use of over-the-counter thumb spica splint. -Recommend Tylenol  for pain. -If pain persists, consider referral to orthopedist or sports medicine specialist.  Gastroesophageal Reflux Disease (GERD) Positive response to Famotidine . -Refill prescription for Famotidine .  Chronic Recurrent Pruritus Intermittent itching, worse in winter, possibly due to dry skin. -Advise continued use of lukewarm showers, moisturizer twice daily, and Dove moisturizing soap. -Prescribe medium potency steroid (Triamcinolone ) for breakthrough itching. -Advise against continued use of current potent steroid due to potential side effects.  Cardiac History with Recent Chest Pain History of quadruple bypass surgery two years ago, recent intermittent chest pain post-exercise, atypical presentation. -Perform EKG today. -Order one set of troponin stat. -Advise patient to seek emergency department evaluation if pain returns and does not resolve quickly. -Referral to cardiologist within 1-2 weeks.    -Note:EKG showed sinus rhythm with no indication of ischemia.  Follow up in one month or sooner if needed

## 2023-09-11 NOTE — Telephone Encounter (Signed)
Appt later today.

## 2023-09-11 NOTE — Progress Notes (Signed)
 Subjective:    Patient ID: Katrina Walters, female    DOB: September 14, 1955, 68 y.o.   MRN: [id number]  HPI Discussed the use of AI scribe software for clinical note transcription with the patient, who gave verbal consent to proceed.  History of Present Illness   The patient, with a history of GERD, polycythemia vera, and quadruple bypass surgery two years ago, presents with three primary complaints: right hand pain, increased body itching, and recent episodes of chest discomfort.  The patient has been experiencing right hand pain, specifically around the thumb area, for the past ten days. Despite the pain, she continues to use the hand.    She also reports an increase in body itching, particularly on the hands, legs, and thighs. This itching is more severe this season, despite her usual pattern of increased itching with each change of season(worse in winter). She has been using an over-the-counter anti-itch cream and a prescribed  betemasone cream, which she has run out of.  In addition to these symptoms, the patient has noticed chest discomfort following exercise. This discomfort does not occur immediately after exercise, but rather a few hours later, and has happened twice over the past two weeks. The discomfort is described as not severe, but noticeable and unusual. The patient denies associated symptoms such as jaw pain, shoulder pain, nausea, vomiting, or sweating episodes with the chest discomfort.  The patient also mentions that she has been taking famotidine  for her GERD symptoms and has found it to be effective. She requests a refill of this medication.        Review of Systems  Constitutional:  Negative for chills, fatigue and fever.  HENT:  Negative for congestion.   Respiratory:  Negative for cough, chest tightness, shortness of breath and wheezing.   Cardiovascular:  Positive for chest pain. Negative for palpitations.       See hpi. Last event 3 days ago.  Gastrointestinal:  Negative  for abdominal pain, constipation, nausea and vomiting.  Genitourinary:  Negative for dysuria and frequency.  Musculoskeletal:  Negative for back pain.       See hpi.  Skin:  Negative for rash.  Neurological:  Negative for dizziness and light-headedness.  Hematological:  Negative for adenopathy.  Psychiatric/Behavioral:  Negative for behavioral problems and confusion.     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    Osteoporosis 09/2016   Peripheral arterial disease (HCC)    Polycythemia vera (HCC)    PONV (postoperative nausea and vomiting)      Social History   Socioeconomic History   Marital status: Married    Spouse name: Not on file   Number of children: 1   Years of education: Not on file   Highest education level: 12th grade  Occupational History   Occupation: unemployed at the time  Tobacco Use   Smoking status: Never   Smokeless tobacco: Never  Vaping Use   Vaping status: Never Used  Substance and Sexual Activity   Alcohol use: Yes    Alcohol/week: 1.0 standard drink of alcohol    Types: 1  Glasses of wine per week    Comment: occassionally   Drug use: No    Comment: CBD gummies   Sexual activity: Yes    Birth control/protection: Post-menopausal  Other Topics Concern   Not on file  Social History Narrative   Not on file   Social Drivers of Health   Financial Resource Strain: Medium Risk (09/08/2023)   Overall Financial Resource Strain (CARDIA)    Difficulty of Paying Living Expenses: Somewhat hard  Food Insecurity: Food Insecurity Present (09/08/2023)   Hunger Vital Sign    Worried About Running Out of Food in the Last Year: Sometimes true     Ran Out of Food in the Last Year: Never true  Transportation Needs: No Transportation Needs (09/08/2023)   PRAPARE - Administrator, Civil Service (Medical): No    Lack of Transportation (Non-Medical): No  Physical Activity: Insufficiently Active (09/08/2023)   Exercise Vital Sign    Days of Exercise per Week: 3 days    Minutes of Exercise per Session: 20 min  Stress: Stress Concern Present (09/08/2023)   [employer] of Occupational Health - Occupational Stress Questionnaire    Feeling of Stress : To some extent  Social Connections: Moderately Integrated (09/08/2023)   Social Connection and Isolation Panel [NHANES]    Frequency of Communication with Friends and Family: More than three times a week    Frequency of Social Gatherings with Friends and Family: Once a week    Attends Religious Services: Never    Database Administrator or Organizations: Yes    Attends Banker Meetings: 1 to 4 times per year    Marital Status: Married  Recent Concern: Social Connections - Moderately Isolated (06/15/2023)   Social Connection and Isolation Panel [NHANES]    Frequency of Communication with Friends and Family: More than three times a week    Frequency of Social Gatherings with Friends and Family: Once a week    Attends Religious Services: Never    Database Administrator or Organizations: No    Attends Banker Meetings: Never    Marital Status: Married  Catering Manager Violence: Not At Risk (02/23/2023)   Humiliation, Afraid, Rape, and Kick questionnaire    Fear of Current or Ex-Partner: No    Emotionally Abused: No    Physically Abused: No    Sexually Abused: No    Past Surgical History:  Procedure Laterality Date   ABDOMINAL HYSTERECTOMY  08/2019   ANAL RECTAL MANOMETRY N/A 11/12/2020   Procedure: ANO RECTAL MANOMETRY;  Surgeon: San Sandor GAILS, DO;  Location: [facility];  Service: Gastroenterology;  Laterality: N/A;   BLADDER SURGERY   08/2019   BREAST REDUCTION SURGERY  2006   CARDIAC CATHETERIZATION N/A 07/17/2016   Procedure: Left Heart Cath and Coronary Angiography;  Surgeon: Lonni JONETTA Cash, MD;  Location[facility] INVASIVE CV LAB;  Service: Cardiovascular;  Laterality: N/A;   CATARACT EXTRACTION W/ INTRAOCULAR LENS IMPLANT Bilateral    COLONOSCOPY  2015   CORONARY ARTERY BYPASS GRAFT N/A 06/04/2021   Procedure: CORONARY ARTERY BYPASS GRAFTING (CABG), ON PUMP, TIMES FOUR, USING LEFT INTERNAL MAMMARY ARTERY, LEFT RADIAL ARTERY (HARVESTED OPEN), AND RIGHT GREATER SAPHENOUS VEIN (HARVESTED ENDOSCOPICALLY);  Surgeon: Shyrl Linnie KIDD, MD;  Location: [facility] OR;  Service: Open Heart Surgery;  Laterality: N/A;   EYE SURGERY     LEFT HEART CATH AND CORONARY ANGIOGRAPHY N/A 04/22/2021   Procedure: LEFT HEART CATH AND CORONARY ANGIOGRAPHY;  Surgeon:  Claudene Victory ORN, MD;  Location: [facility] INVASIVE CV LAB;  Service: Cardiovascular;  Laterality: N/A;   POLYPECTOMY  2015   RADIAL ARTERY HARVEST Left 06/04/2021   Procedure: RADIAL ARTERY HARVEST;  Surgeon: Shyrl Linnie KIDD, MD;  Location: [facility] OR;  Service: Open Heart Surgery;  Laterality: Left;   TEE WITHOUT CARDIOVERSION N/A 06/04/2021   Procedure: TRANSESOPHAGEAL ECHOCARDIOGRAM (TEE);  Surgeon: Shyrl Linnie KIDD, MD;  Location: [facility] OR;  Service: Open Heart Surgery;  Laterality: N/A;   TONSILLECTOMY      Family History  Problem Relation Age of Onset   Heart disease Mother        died at 79 of heart attack   Hypertension Mother    Heart attack Mother    Heart disease Father        had heart attack at age 57   Heart attack Father    Rectal cancer Father 41   Hearing loss Father    Cancer Paternal Aunt    Stomach cancer Paternal Grandmother    Colon cancer Neg Hx    Colon polyps Neg Hx    Esophageal cancer Neg Hx     Allergies  Allergen Reactions   Amoxicillin      Had fever with flu like symptoms. Has patient had a PCN reaction causing immediate rash,  facial/tongue/throat swelling, SOB or lightheadedness with hypotension: no Has patient had a PCN reaction causing severe rash involving mucus membranes or skin necrosis: no Has patient had a PCN reaction that required hospitalization no Has patient had a PCN reaction occurring within the last 10 years: yes If all of the above answers are NO, then may proceed with Cephalosporin use.   Pravastatin  Itching    Current Outpatient Medications on File Prior to Visit  Medication Sig Dispense Refill   aspirin  81 MG chewable tablet Chew by mouth daily.     Bismuth  262 MG CHEW Chew 524 mg by mouth in the morning, at noon, in the evening, and at bedtime. 112 tablet 0   colchicine  0.6 MG tablet Take 1 tablet (0.6 mg total) by mouth 2 (two) times daily. 180 tablet 3   Cranberry-Vitamin C 250-60 MG CAPS Take 2 tablets by mouth daily.     D-MANNOSE PO Take 2 tablets by mouth daily.     denosumab  (PROLIA ) 60 MG/ML SOSY injection INJECT 1 SYRINGE UNDER THE SKIN ONCE EVERY 6 MONTHS 1 mL 1   diclofenac  (VOLTAREN ) 75 MG EC tablet Take 1 tablet (75 mg total) by mouth 2 (two) times daily. 20 tablet 0   diphenhydrAMINE  (BENADRYL ) 25 MG tablet Take 25 mg by mouth at bedtime.     ezetimibe  (ZETIA ) 10 MG tablet Take 1 tablet (10 mg total) by mouth daily. Please keep scheduled appointment for additional refills. 90 tablet 3   FIBER PO Take 2 tablets by mouth daily.     levocetirizine (XYZAL ) 5 MG tablet Take 1 tablet (5 mg total) by mouth every evening. 90 tablet 1   linaclotide  (LINZESS ) 290 MCG CAPS capsule Take 1 capsule (290 mcg total) by mouth daily before breakfast. 90 capsule 5   mupirocin  ointment (BACTROBAN ) 2 % Apply 1 Application topically 2 (two) times daily. 22 g 0   nitazoxanide  (ALINIA ) 500 MG tablet Take 1 tablet (500 mg total) by mouth 2 (two) times daily with a meal. 20 tablet 0   nitrofurantoin , macrocrystal-monohydrate, (MACROBID ) 100 MG capsule Take 1 tablet by mouth twice a day for 5 days. 10  capsule  0   NON FORMULARY Take 0.5 tablets by mouth at bedtime. CBD oil - for pain Gummies     omeprazole  (PRILOSEC) 40 MG capsule TAKE 1 CAPSULE (40 MG TOTAL) BY MOUTH DAILY. 90 capsule 1   ondansetron  (ZOFRAN ) 4 MG tablet Take 1 tablet (4 mg total) by mouth every 8 (eight) hours as needed for nausea or vomiting. 30 tablet 1   Polyethyl Glycol-Propyl Glycol (SYSTANE OP) Place 1 drop into both eyes in the morning and at bedtime.     PREMARIN  vaginal cream Place 0.25 Applicatorfuls vaginally See admin instructions. Insert 0.5 grams vaginally every other night 30 g 12   rosuvastatin  (CRESTOR ) 10 MG tablet Take 1 tablet (10 mg total) by mouth daily. 90 tablet 3   sodium zirconium cyclosilicate  (LOKELMA ) 5 g packet 5 gram pack twice a week(Tuesday and Thursday) 8 packet 0   traZODone  (DESYREL ) 50 MG tablet TAKE 0.5-1 TABLETS BY MOUTH AT BEDTIME AS NEEDED FOR SLEEP. 90 tablet 2   Wheat Dextrin (BENEFIBER DRINK MIX PO) Take by mouth daily at 6 (six) AM.     Current Facility-Administered Medications on File Prior to Visit  Medication Dose Route Frequency Provider Last Rate Last Admin   [START ON 02/18/2024] denosumab  (PROLIA ) injection 60 mg  60 mg Subcutaneous Once Ramiro Pangilinan, PA-C        BP 116/60   Pulse 82   Temp 98 F (36.7 C)   Resp 18   Ht 5' 3 (1.6 m)   Wt 98 lb 12.8 oz (44.8 kg)   LMP  (LMP Unknown)   SpO2 100%   BMI 17.50 kg/m        Objective:   Physical Exam  General Mental Status- Alert. General Appearance- Not in acute distress.   Skin General: Color- Normal Color. Moisture- Normal Moisture.  Neck Carotid Arteries- Normal color. Moisture- Normal Moisture. No carotid bruits. No JVD.  Chest and Lung Exam Auscultation: Breath Sounds:-Normal. CTA  Cardiovascular Auscultation:Rythm- RRRR Murmurs & Other Heart Sounds:Auscultation of the heart reveals- No Murmurs.  Abdomen Inspection:-Inspeection Normal. Palpation/Percussion:Note:No mass. Palpation and  Percussion of the abdomen reveal- Non Tender, Non Distended + BS, no rebound or guarding.    Neurologic Cranial Nerve exam:- CN III-XII intact(No nystagmus), symmetric smile. Drift Test:- No drift. Romberg Exam:- Negative.  Heal to Toe Gait exam:-Normal. Finger to Nose:- Normal/Intact Strength:- 5/5 equal and symmetric strength both upper and lower extremities.   Lower ext- calves symmetric. No edema.  Anterior chest- on palpation of pt left side chest pain no reproducible.    Assessment & Plan:   Patient Instructions  Right Hand Pain Pain localized to the right thumb for the past 10 days, possibly due to overuse(tenosynovitis) or arthritis. -Order right hand x-ray. -Advise use of over-the-counter thumb spica splint. -Recommend Tylenol  for pain. -If pain persists, consider referral to orthopedist or sports medicine specialist.  Gastroesophageal Reflux Disease (GERD) Positive response to Famotidine . -Refill prescription for Famotidine .  Chronic Recurrent Pruritus Intermittent itching, worse in winter, possibly due to dry skin. -Advise continued use of lukewarm showers, moisturizer twice daily, and Dove moisturizing soap. -Prescribe medium potency steroid (Triamcinolone ) for breakthrough itching. -Advise against continued use of current potent steroid due to potential side effects.  Cardiac History with Recent Chest Pain History of quadruple bypass surgery two years ago, recent intermittent chest pain post-exercise, atypical presentation. -Perform EKG today. -Order one set of troponin stat. -Advise patient to seek emergency department evaluation if pain returns and does not  resolve quickly. -Referral to cardiologist within 1-2 weeks.    -Note:EKG showed sinus rhythm with no indication of ischemia.   Follow up in one month or sooner if needed  Time spent with patient today was  42 minutes which consisted of chart revdiew, discussing diagnosis, work up treatment and  documentation. Total time did not include time for EKG.

## 2023-09-13 ENCOUNTER — Encounter: Payer: Self-pay | Admitting: Cardiovascular Disease

## 2023-09-14 ENCOUNTER — Telehealth: Payer: Self-pay

## 2023-09-14 ENCOUNTER — Inpatient Hospital Stay: Payer: Medicare Other

## 2023-09-14 ENCOUNTER — Inpatient Hospital Stay: Payer: Medicare Other | Attending: Hematology & Oncology

## 2023-09-14 DIAGNOSIS — D45 Polycythemia vera: Secondary | ICD-10-CM | POA: Diagnosis not present

## 2023-09-14 DIAGNOSIS — D509 Iron deficiency anemia, unspecified: Secondary | ICD-10-CM

## 2023-09-14 LAB — CBC WITH DIFFERENTIAL (CANCER CENTER ONLY)
Abs Immature Granulocytes: 0.08 10*3/uL — ABNORMAL HIGH (ref 0.00–0.07)
Basophils Absolute: 0.1 10*3/uL (ref 0.0–0.1)
Basophils Relative: 1 %
Eosinophils Absolute: 0.4 10*3/uL (ref 0.0–0.5)
Eosinophils Relative: 3 %
HCT: 44.9 % (ref 36.0–46.0)
Hemoglobin: 12.4 g/dL (ref 12.0–15.0)
Immature Granulocytes: 1 %
Lymphocytes Relative: 15 %
Lymphs Abs: 2 10*3/uL (ref 0.7–4.0)
MCH: 18.1 pg — ABNORMAL LOW (ref 26.0–34.0)
MCHC: 27.6 g/dL — ABNORMAL LOW (ref 30.0–36.0)
MCV: 65.5 fL — ABNORMAL LOW (ref 80.0–100.0)
Monocytes Absolute: 0.8 10*3/uL (ref 0.1–1.0)
Monocytes Relative: 6 %
Neutro Abs: 10.2 10*3/uL — ABNORMAL HIGH (ref 1.7–7.7)
Neutrophils Relative %: 74 %
Platelet Count: 542 10*3/uL — ABNORMAL HIGH (ref 150–400)
RBC: 6.86 MIL/uL — ABNORMAL HIGH (ref 3.87–5.11)
RDW: 23.4 % — ABNORMAL HIGH (ref 11.5–15.5)
WBC Count: 13.5 10*3/uL — ABNORMAL HIGH (ref 4.0–10.5)
nRBC: 0 % (ref 0.0–0.2)

## 2023-09-14 NOTE — Telephone Encounter (Signed)
 Patient states she has noticed lately that she is starting to have left sided chest pain and arm pain after she gets done exercising on her treadmill at home. Sometimes takes hours for it to go away. Today currently denies any pain but also hasn't worked out. Advised to not work out until she sees the APP tomorrow for evaluation. Made appointment with MARLA Mose ARNP for tommorow at 10:30 am.

## 2023-09-14 NOTE — Progress Notes (Signed)
 HCT 44.9  no phlebotomy needed today. Patient to keep regularly scheduled appts.

## 2023-09-14 NOTE — Progress Notes (Signed)
 Cardiology Office Note    Date:  09/15/2023  ID:  Kaidan Spengler, DOB [birth year], MRN [id number] PCP:  Dorina Loving, PA-C  Cardiologist:  Darryle ONEIDA Decent, MD  Electrophysiologist:  None   Chief Complaint: Chest pain   History of Present Illness: .    Tamasha Laplante is a 68 y.o. female with visit-pertinent history of CAD s/p CABG in 06/04/2021, hypertension, hyperlipidemia, polycythemia vera.  Left heart catheterization in 04/2021 showed 60% LM stenosis, 50% pLAD stenosis followed by 70% lesion, 70% D1 stenosis, 20% P RCA stenosis, 50% mRCA stenosis and 100% PDA stenosis with left-to-right collaterals.  Echo on 04/2021 showed EF 55 to 60%, G1 DD, basal inferior hypokinesis and no significant valvular abnormalities.  Patient underwent CABG x 4 on 06/04/2021 with LIMA to the LAD, reverse SVG to PDA, reverse SVG to OM and reverse SVG to D1.   She was last seen by Dr. Decent on 10/31/22 for routine follow-up.  She noted an occasional fluttering in her chest lasting seconds that occur every 3 to 4 days, she had worn cardiac monitors in the past that were unremarkable.  She had remained stable from a cardiac standpoint.  Today she presents regarding intermittent chest pain.  She reports that she has been having chest tightness after exercise that has been intermittently occurring the last few months.  She notes that a few days ago she exercised on the treadmill then the following day experienced chest tightness and left arm discomfort, she denied any associated symptoms, she did not have improvement with movement of the arm. Notes she laid down and went to sleep a few hours later woke up and her discomfort was resolved.  She reports that she is able to rub her chest and feels some slight improvement although she is unsure. She notes she has been having similar episodes where she will exercise and not have any chest discomfort then a few hours later will have recurring chest tightness and left arm discomfort.  She  denies chest pain during exercise. She notes some baseline shortness of breath and fatigue related to her polycythemia vera.  Labwork independently reviewed: 09/14/2023: Hemoglobin 12.4, hematocrit 44.9, WBC 13.5, MCV 65.5, MCH 18.1 08/12/2023: Potassium 4.1, creatinine 0.77 07/13/2023: TSH 1.9 ROS: .   Today she denies lower extremity edema, fatigue, palpitations, melena, hematuria, hemoptysis, diaphoresis, weakness, presyncope, syncope, orthopnea, and PND.  All other systems are reviewed and otherwise negative. Studies Reviewed: SABRA   EKG:  EKG is ordered today, personally reviewed, demonstrating  EKG Interpretation Date/Time:  Tuesday September 15 2023 10:19:57 EST Ventricular Rate:  81 PR Interval:  126 QRS Duration:  80 QT Interval:  380 QTC Calculation: 441 R Axis:   81  Text Interpretation: Normal sinus rhythm Nonspecific T wave abnormality No significant change since last tracing Confirmed by Anahlia Iseminger [phone]) on 09/15/2023 10:38:21 AM   CV Studies:  Cardiac Studies & Procedures   CARDIAC CATHETERIZATION  CARDIAC CATHETERIZATION 04/22/2021  Narrative   60% distal left main   40 to 50% proximal LAD followed by eccentric 70% stenosis in the proximal to mid segment.  First diagonal contains segmental 70 to 80% proximal stenosis.   Circumflex is free of critical obstructive disease.   RCA contains total occlusion of the PDA and a left ventricular branch that is supplied by left to right collaterals.   LVEDP was normal at 4 mmHg.  LV function is normal with EF 65%.  Discussed with referring cardiologist, Dr. Darryle Decent. Coronary  bypass grafting appears to be the treatment of choice given coronary anatomy, young age, and otherwise good long-term prognosis. Will arrange outpatient surgical consultation.  Findings Coronary Findings Diagnostic  Dominance: Right  Left Main Ost LM to LM lesion is 40% stenosed. Mid LM to Dist LM lesion is 60% stenosed.  Left Anterior  Descending Vessel is large. Ost LAD to Mid LAD lesion is 50% stenosed. The lesion is calcified. Mid LAD lesion is 70% stenosed.  First Diagonal Branch Vessel is small in size. 1st Diag lesion is 70% stenosed.  Ramus Intermedius Vessel is small.  Left Circumflex  First Obtuse Marginal Branch Vessel is large in size. Ost 1st Mrg to 1st Mrg lesion is 40% stenosed.  Third Obtuse Marginal Branch Vessel is moderate in size.  Right Coronary Artery Prox RCA lesion is 20% stenosed. Prox RCA to Mid RCA lesion is 20% stenosed. The lesion is calcified. Mid RCA lesion is 50% stenosed.  Right Posterior Descending Artery Vessel is small in size.  Right Posterior Atrioventricular Artery Collaterals RPAV filled by collaterals from 2nd Sept.  RPAV lesion is 100% stenosed.  Third Right Posterolateral Branch Collaterals 3rd RPL filled by collaterals from Dist Cx.  Intervention  No interventions have been documented.   CARDIAC CATHETERIZATION  CARDIAC CATHETERIZATION 07/17/2016  Narrative  The left ventricular systolic function is normal.  LV end diastolic pressure is normal.  The left ventricular ejection fraction is greater than 65% by visual estimate.  There is no mitral valve regurgitation.  Prox RCA lesion, 20 %stenosed.  Mid RCA lesion, 50 %stenosed.  Dist RCA lesion, 100 %stenosed.  Ost LM lesion, 40 %stenosed.  Ost LM to LM lesion, 40 %stenosed.  Mid LAD lesion, 50 %stenosed.  1st Diag lesion, 70 %stenosed.  Ost 1st Mrg to 1st Mrg lesion, 40 %stenosed.  Ost LAD to Prox LAD lesion, 20 %stenosed.  Prox RCA to Mid RCA lesion, 20 %stenosed.  1. Triple vessel CAD 2. Mild to moderate stenosis left main artery. This appears to be eccentric and does not appear to be flow limiting. 3. Moderate stenosis mid LAD. The small caliber diagonal branch has moderately severe stenosis. 4. Mild to moderate stenosis in the obtuse marginal branch of the Circumflex 5. The  RCA is a large dominant vessel with moderate distal stenosis prior to total occlusion of the vessel at the distal bifurcation. The posterolateral artery and the posterior descending artery fills from left to right collaterals. 6. Normal LV systolic function  Recommendations: Her abnormal stress test is likely due to the chronic occlusion of the distal RCA. She has mild to moderate stenosis in the left main artery as well as moderate stenosis in the LAD. I think the best plan for now is medical management of her CAD. If she fails medical management, will have to consider CABG.  Findings Coronary Findings Diagnostic  Dominance: Right  Left Main  Left Anterior Descending Vessel is large. The lesion is calcified.  First Diagonal Branch Vessel is small in size.  Ramus Intermedius Vessel is small.  Left Circumflex  First Obtuse Marginal Branch Vessel is large in size.  Third Obtuse Marginal Branch Vessel is moderate in size. Collaterals 3rd Mrg filled by collaterals from 3rd Mrg.  Right Coronary Artery Collaterals Dist RCA filled by collaterals from Dist Cx.  The lesion is calcified.  The lesion is chronically occluded.  Right Posterior Descending Artery Collaterals RPDA filled by collaterals from 3rd Mrg.  Collaterals RPDA filled by collaterals from 1st Sept.  Intervention  No interventions have been documented.   STRESS TESTS  EXERCISE TOLERANCE TEST (ETT) 06/27/2016  Narrative  Blood pressure demonstrated a normal response to exercise.  No T wave inversion was noted during stress.  Horizontal ST segment depression ST segment depression of 2 mm was noted during stress in the II, III and aVF leads, and returning to baseline after less than 1 minute of recovery.  Abnormal adequate bruce treadmill stress test with 2 mm horizontal ST segment depression in II, III, AVF and 2 mm ST elevation in AVR at peak exercise, which returned to baseline after 1 minute into  recovery. Findings suggestive of ischemia.  ECHOCARDIOGRAM  ECHOCARDIOGRAM COMPLETE 05/08/2021  Narrative ECHOCARDIOGRAM REPORT    Patient Name:   GWENDOLYNN MERKEY Date of Exam: 05/08/2021 Medical Rec #:  [id number]  Height:       63.0 in Accession #:    [id number] Weight:       105.0 lb Date of Birth:  04/21/1956  BSA:          1.470 m Patient Age:    65 years   BP:           121/83 mmHg Patient Gender: F          HR:           74 bpm. Exam Location:  [place] [facility]  Procedure: 2D Echo, Cardiac Doppler, Color Doppler and Strain Analysis  Indications:    I25.110 Atherosclerotic heart disease of native coronary artery with unstable angina pectoris  History:        Patient has no prior history of Echocardiogram examinations. CAD, Signs/Symptoms:Shortness of Breath; Risk Factors:Dyslipidemia and Non-Smoker. Patient had left heart cath on 04/22/21 LVEF 65% and is scheduled for surgical consultation for coronary bypass.  Sonographer:    Roseline Finder RDCS, RVT Referring Phys: [id number] HARRELL O LIGHTFOOT  IMPRESSIONS   1. Left ventricular ejection fraction, by estimation, is 55 to 60%. The left ventricle has normal function. The left ventricle demonstrates regional wall motion abnormalities (see scoring diagram/findings for description). Left ventricular diastolic parameters are consistent with Grade I diastolic dysfunction (impaired relaxation). The average left ventricular global longitudinal strain is -12.6 %. The global longitudinal strain is abnormal. 2. Right ventricular systolic function is normal. The right ventricular size is normal. 3. The mitral valve is normal in structure. No evidence of mitral valve regurgitation. No evidence of mitral stenosis. 4. The aortic valve is tricuspid. Aortic valve regurgitation is not visualized. No aortic stenosis is present. 5. The inferior vena cava is normal in size with greater than 50% respiratory variability, suggesting right atrial pressure of 3  mmHg.  FINDINGS Left Ventricle: Left ventricular ejection fraction, by estimation, is 55 to 60%. The left ventricle has normal function. The left ventricle demonstrates regional wall motion abnormalities. The average left ventricular global longitudinal strain is -12.6 %. The global longitudinal strain is abnormal. The left ventricular internal cavity size was normal in size. There is no left ventricular hypertrophy. Left ventricular diastolic parameters are consistent with Grade I diastolic dysfunction (impaired relaxation). Normal left ventricular filling pressure.   LV Wall Scoring: The basal inferior segment is hypokinetic. The entire anterior wall, entire lateral wall, entire septum, entire apex, and mid and distal inferior wall are normal.  Right Ventricle: The right ventricular size is normal. No increase in right ventricular wall thickness. Right ventricular systolic function is normal.  Left Atrium: Left atrial size was normal in size.  Right Atrium: Right  atrial size was normal in size.  Pericardium: There is no evidence of pericardial effusion.  Mitral Valve: The mitral valve is normal in structure. No evidence of mitral valve regurgitation. No evidence of mitral valve stenosis.  Tricuspid Valve: The tricuspid valve is normal in structure. Tricuspid valve regurgitation is trivial. No evidence of tricuspid stenosis.  Aortic Valve: The aortic valve is tricuspid. Aortic valve regurgitation is not visualized. No aortic stenosis is present. Aortic valve mean gradient measures 2.0 mmHg. Aortic valve peak gradient measures 2.7 mmHg. Aortic valve area, by VTI measures 2.85 cm.  Pulmonic Valve: The pulmonic valve was normal in structure. Pulmonic valve regurgitation is not visualized. No evidence of pulmonic stenosis.  Aorta: The aortic root, ascending aorta, aortic arch and descending aorta are all structurally normal, with no evidence of dilitation or obstruction.  Venous: The  pulmonary veins were not well visualized. The inferior vena cava is normal in size with greater than 50% respiratory variability, suggesting right atrial pressure of 3 mmHg.  IAS/Shunts: No atrial level shunt detected by color flow Doppler.   LEFT VENTRICLE PLAX 2D LVIDd:         4.49 cm     Diastology LVIDs:         3.10 cm     LV e' medial:    3.16 cm/s LV PW:         0.80 cm     LV E/e' medial:  12.8 LV IVS:        0.83 cm     LV e' lateral:   5.96 cm/s LVOT diam:     2.10 cm     LV E/e' lateral: 6.8 LV SV:         50 LV SV Index:   34          2D Longitudinal Strain LVOT Area:     3.46 cm    2D Strain GLS Avg:     -12.6 %  LV Volumes (MOD) LV vol d, MOD A2C: 55.1 ml LV vol d, MOD A4C: 61.9 ml LV vol s, MOD A2C: 20.6 ml LV vol s, MOD A4C: 28.4 ml LV SV MOD A2C:     34.5 ml LV SV MOD A4C:     61.9 ml LV SV MOD BP:      34.6 ml  RIGHT VENTRICLE            IVC RV Basal diam:  2.06 cm    IVC diam: 1.51 cm RV Mid diam:    2.00 cm RV S prime:     9.64 cm/s TAPSE (M-mode): 2.0 cm  LEFT ATRIUM             Index       RIGHT ATRIUM          Index LA diam:        3.70 cm 2.52 cm/m  RA Area:     7.11 cm LA Vol (A2C):   24.4 ml 16.59 ml/m RA Volume:   11.00 ml 7.48 ml/m LA Vol (A4C):   27.6 ml 18.77 ml/m LA Biplane Vol: 27.0 ml 18.36 ml/m AORTIC VALVE                   PULMONIC VALVE AV Area (Vmax):    2.96 cm    PV Vmax:       0.55 m/s AV Area (Vmean):   2.85 cm    PV Peak grad:  1.2 mmHg AV Area (  VTI):     2.85 cm AV Vmax:           81.40 cm/s AV Vmean:          63.300 cm/s AV VTI:            0.176 m AV Peak Grad:      2.7 mmHg AV Mean Grad:      2.0 mmHg LVOT Vmax:         69.60 cm/s LVOT Vmean:        52.100 cm/s LVOT VTI:          0.145 m LVOT/AV VTI ratio: 0.82  AORTA Ao Root diam: 2.80 cm Ao Asc diam:  3.10 cm Ao Arch diam: 2.6 cm  MITRAL VALVE               TRICUSPID VALVE MV Area (PHT): 3.19 cm    TR Peak grad:   9.5 mmHg MV Decel Time: 238 msec     TR Vmax:        154.00 cm/s MV E velocity: 40.40 cm/s MV A velocity: 43.20 cm/s  SHUNTS MV E/A ratio:  0.94        Systemic VTI:  0.14 m Systemic Diam: 2.10 cm  Redell Leiter MD Electronically signed by Redell Leiter MD Signature Date/Time: 05/08/2021/5:54:11 PM    Final  TEE  ECHO INTRAOPERATIVE TEE 06/04/2021  Narrative *INTRAOPERATIVE TRANSESOPHAGEAL REPORT *    Patient Name:   ERLINDA SOLINGER Date of Exam: 06/04/2021 Medical Rec #:  [id number]  Height:       63.0 in Accession #:    [id number] Weight:       104.0 lb Date of Birth:  [birth year]  BSA:          1.46 m Patient Age:    65 years   BP:           149/8 mmHg Patient Gender: F          HR:           58 bpm. Exam Location:  Inpatient  Transesophogeal exam was perform intraoperatively during surgical procedure. Patient was closely monitored under general anesthesia during the entirety of examination.  Indications:     CABG Performing Phys: [id number] LINNIE KIDD LIGHTFOOT Diagnosing Phys: Norleen Pope MD  Complications: No known complications during this procedure. POST-OP IMPRESSIONS _ Left Ventricle: The left ventricle is unchanged from pre-bypass. _ Right Ventricle: The right ventricle appears unchanged from pre-bypass. _ Aorta: The aorta appears unchanged from pre-bypass. _ Left Atrial Appendage: The left atrial appendage appears unchanged from pre-bypass. _ Aortic Valve: The aortic valve appears unchanged from pre-bypass. _ Mitral Valve: The mitral valve appears unchanged from pre-bypass. _ Tricuspid Valve: The tricuspid valve appears unchanged from pre-bypass. _ Pulmonic Valve: The pulmonic valve appears unchanged from pre-bypass. _ Interatrial Septum: The interatrial septum appears unchanged from pre-bypass.  PRE-OP FINDINGS Left Ventricle: The left ventricle has normal systolic function, with an ejection fraction of 55-60%. The cavity size was normal. There is mild concentric left ventricular  hypertrophy.   Right Ventricle: The right ventricle has normal systolic function. The cavity was normal. There is no increase in right ventricular wall thickness.  Left Atrium: Left atrial size was dilated. No left atrial/left atrial appendage thrombus was detected. The left atrial appendage is well visualized and there is no evidence of thrombus present.  Right Atrium: Right atrial size was normal in size.  Interatrial Septum: No atrial level shunt detected  by color flow Doppler. There is right bowing of the interatrial septum, suggestive of elevated left atrial pressure. There is no evidence of a patent foramen ovale.  Pericardium: There is no evidence of pericardial effusion.  Mitral Valve: The mitral valve is normal in structure. Mitral valve regurgitation is trivial by color flow Doppler. There is no evidence of mitral valve vegetation.  Tricuspid Valve: The tricuspid valve was normal in structure. Tricuspid valve regurgitation is mild by color flow Doppler.  Aortic Valve: The aortic valve is tricuspid Aortic valve regurgitation is trivial by color flow Doppler. There is no stenosis of the aortic valve. There is no evidence of aortic valve vegetation.   Pulmonic Valve: The pulmonic valve was normal in structure. Pulmonic valve regurgitation is trivial by color flow Doppler.   Aorta: There is evidence of plaque in the descending aorta; Grade II, measuring 2-80mm in size.  Shunts: There is no evidence of an atrial septal defect.  +-------------+-----------++ AORTIC VALVE             +-------------+-----------++ AV Vmax:     119.87 cm/s +-------------+-----------++ AV Vmean:    80.800 cm/s +-------------+-----------++ AV VTI:      0.303 m     +-------------+-----------++ AV Peak Grad:5.7 mmHg    +-------------+-----------++ AV Mean Grad:3.3 mmHg    +-------------+-----------++   Norleen Pope MD Electronically signed by Norleen Pope  MD Signature Date/Time: 06/04/2021/2:50:34 PM    Final  MONITORS  LONG TERM MONITOR (3-14 DAYS) 08/19/2021  Narrative Enrollment 08/02/2021-08/12/2021 (6 days 18 hours). Patient had a min HR of 62 bpm (normal sinus rhythm), max HR of 176 bpm (supraventricular tachycardia, 8 beats, 3 second duration), and avg HR of 88 bpm (normal sinus rhythm). Predominant underlying rhythm was Sinus Rhythm. 8 Supraventricular Tachycardia runs occurred, the run with the fastest interval lasting 8 beats (3 second duration) with a max rate of 176 bpm, the longest lasting 16 beats (6.8 seconds) with an avg rate of 141 bpm. Episodes represent brief atrial tachycardia. Supraventricular Tachycardia was detected within +/- 45 seconds of symptomatic patient event(s). Isolated SVEs were rare (<1.0%), SVE Couplets were rare (<1.0%), and SVE Triplets were rare (<1.0%). Isolated VEs were rare (<1.0%), and no VE Couplets or VE Triplets were present. Diary summarized below:  08/04/21 10:38am pounding coincided with sinus tachycardia 113 bpm. 08/04/21 11:47am pounding coincided with sinus tachycardia 122 bpm. 08/04/21 04:15pm pounding coincided with brief supraventricular tachycardia 174 bpm. 08/05/21 07:05pm pounding coincided with normal sinus rhythm 85 bpm.  Impression: 1. Brief atrial tachycardia episodes detected (8 episodes in 7 days, longest episode 6.8 seconds). 2. No atrial fibrillation. 3. Rare ectopy.  Darryle T. Barbaraann, MD, [facility]  [facility] [address], [address] [place], [place] [address] [phone] 12:20 PM  CT SCANS  CT CARDIAC SCORING (SELF PAY ONLY) 08/29/2014          Current Reported Medications:.    No outpatient medications have been marked as taking for the 09/15/23 encounter (Office Visit) with Emil Weigold D, NP.   Current Facility-Administered Medications for the 09/15/23 encounter (Office Visit) with Toyia Jelinek D, NP  Medication   [START ON 02/18/2024] denosumab   (PROLIA ) injection 60 mg    Physical Exam:    VS:  BP 108/64   Pulse 81   Ht 5' 3 (1.6 m)   Wt 99 lb 12.8 oz (45.3 kg)   LMP  (LMP Unknown)   SpO2 99%   BMI 17.68 kg/m  Wt Readings from Last 3 Encounters:  09/15/23 99 lb 12.8 oz (45.3 kg)  09/11/23 98 lb 12.8 oz (44.8 kg)  09/10/23 102 lb (46.3 kg)    GEN: Well nourished, well developed in no acute distress NECK: No JVD; No carotid bruits CARDIAC: RRR, no murmurs, rubs, gallops RESPIRATORY:  Clear to auscultation without rales, wheezing or rhonchi  ABDOMEN: Soft, non-tender, non-distended EXTREMITIES:  No edema; No acute deformity   Asessement and Plan:.    CAD: s/p CABG x4 with LIMA to the LAD, reverse SVG to PDA, reverse SVG to OM and reverse SVG to D1 on 06/04/2021.  Today she reports intermittent chest tightness and left arm pain the last few months after exercise.  Her discomfort does not occur during her exercise but typically a few hours following with associated left arm discomfort.  She notes some baseline shortness of breath and fatigue that she is attributed to her polycythemia vera.  Reviewed ED precautions.  Given her history of CABG x 4 in 2022 patient would like to proceed with cardiac PET stress test, see consent below.  Continue aspirin  81 mg daily, Zetia  10 mg daily and Crestor  10 mg daily. Informed Consent   Shared Decision Making/Informed Consent The risks [chest pain, shortness of breath, cardiac arrhythmias, dizziness, blood pressure fluctuations, myocardial infarction, stroke/transient ischemic attack, nausea, vomiting, allergic reaction, radiation exposure, metallic taste sensation and life-threatening complications (estimated to be 1 in 10,000)], benefits (risk stratification, diagnosing coronary artery disease, treatment guidance) and alternatives of a cardiac PET stress test were discussed in detail with Ms. Sanson and she agrees to proceed.     Hyperlipidemia: Last lipid profile on 05/14/2023 indicated total  cholesterol 100, HDL 34.4, triglycerides 107 and LDL 45. Continue Zetia  10 mg daily and Crestor  10 mg daily.  Zetia  refilled.  Hypertension: Blood pressure today 108/64.  She is currently not requiring antihypertensive medications.  Polycythemia vera: Last hemoglobin 12.4 and hematocrit 44.9 on 09/14/2023.  Monitored by hematology.   Disposition: F/u with Dr. Barbaraann on 12/01/23 as scheduled or sooner if needed.   Signed, Jerilee Space D Chidiebere Wynn, NP

## 2023-09-15 ENCOUNTER — Ambulatory Visit: Payer: Medicare Other | Attending: Cardiology | Admitting: Cardiology

## 2023-09-15 ENCOUNTER — Encounter: Payer: Self-pay | Admitting: Cardiology

## 2023-09-15 VITALS — BP 108/64 | HR 81 | Ht 63.0 in | Wt 99.8 lb

## 2023-09-15 DIAGNOSIS — R079 Chest pain, unspecified: Secondary | ICD-10-CM | POA: Diagnosis not present

## 2023-09-15 DIAGNOSIS — E782 Mixed hyperlipidemia: Secondary | ICD-10-CM | POA: Diagnosis not present

## 2023-09-15 DIAGNOSIS — D45 Polycythemia vera: Secondary | ICD-10-CM | POA: Diagnosis not present

## 2023-09-15 DIAGNOSIS — I2510 Atherosclerotic heart disease of native coronary artery without angina pectoris: Secondary | ICD-10-CM

## 2023-09-15 MED ORDER — EZETIMIBE 10 MG PO TABS: 10.0000 mg | ORAL_TABLET | Freq: Every day | ORAL | 3 refills | Status: DC

## 2023-09-15 NOTE — Patient Instructions (Addendum)
 Medication Instructions:  No changes *If you need a refill on your cardiac medications before your next appointment, please call your pharmacy*  Lab Work: No labs  Testing/Procedures:    Please report to Radiology at the [facility] Main Entrance 30 minutes early for your test.  [address] [place], [place] [address]  How to Prepare for Your Cardiac PET/CT Stress Test:  Nothing to eat or drink, except water, 3 hours prior to arrival time.  NO caffeine /decaffeinated products, or chocolate 12 hours prior to arrival. (Please note decaffeinated beverages (teas/coffees) still contain caffeine ).  If you have caffeine  within 12 hours prior, the test will need to be rescheduled.  Medication instructions: Do not take erectile dysfunction medications for 72 hours prior to test (sildenafil, tadalafil) Do not take nitrates (isosorbide mononitrate, Ranexa ) the day before or day of test Do not take tamsulosin the day before or morning of test Hold theophylline containing medications for 12 hours. Hold Dipyridamole 48 hours prior to the test.  Diabetic Preparation: If able to eat breakfast prior to 3 hour fasting, you may take all medications, including your insulin . Do not worry if you miss your breakfast dose of insulin  - start at your next meal. If you do not eat prior to 3 hour fast-Hold all diabetes (oral and insulin ) medications. Patients who wear a continuous glucose monitor MUST remove the device prior to scanning.  You may take your remaining medications with water.  NO perfume, cologne or lotion on chest or abdomen area. FEMALES - Please avoid wearing dresses to this appointment.  Total time is 1 to 2 hours; you may want to bring reading material for the waiting time.  IF YOU THINK YOU MAY BE PREGNANT, OR ARE NURSING PLEASE INFORM THE TECHNOLOGIST.  In preparation for your appointment, medication and supplies will be purchased.  Appointment availability is limited,  so if you need to cancel or reschedule, please call the Radiology Department at [phone] [facility]) OR [phone] [facility]) 24 hours in advance to avoid a cancellation fee of $100.00  What to Expect When you Arrive:  Once you arrive and check in for your appointment, you will be taken to a preparation room within the Radiology Department.  A technologist or Nurse will obtain your medical history, verify that you are correctly prepped for the exam, and explain the procedure.  Afterwards, an IV will be started in your arm and electrodes will be placed on your skin for EKG monitoring during the stress portion of the exam. Then you will be escorted to the PET/CT scanner.  There, staff will get you positioned on the scanner and obtain a blood pressure and EKG.  During the exam, you will continue to be connected to the EKG and blood pressure machines.  A small, safe amount of a radioactive tracer will be injected in your IV to obtain a series of pictures of your heart along with an injection of a stress agent.    After your Exam:  It is recommended that you eat a meal and drink a caffeinated beverage to counter act any effects of the stress agent.  Drink plenty of fluids for the remainder of the day and urinate frequently for the first couple of hours after the exam.  Your doctor will inform you of your test results within 7-10 business days.  For more information and frequently asked questions, please visit our website: [email or web address]  For questions about your test or how to prepare for your  test, please call: Cardiac Imaging Nurse Navigators Office: [phone]  Follow-Up: At [facility], you and your health needs are our priority.  As part of our continuing mission to provide you with exceptional heart care, we have created designated Provider Care Teams.  These Care Teams include your primary Cardiologist (physician) and Advanced Practice Providers (APPs -   Physician Assistants and Nurse Practitioners) who all work together to provide you with the care you need, when you need it.  We recommend signing up for the patient portal called MyChart.  Sign up information is provided on this After Visit Summary.  MyChart is used to connect with patients for Virtual Visits (Telemedicine).  Patients are able to view lab/test results, encounter notes, upcoming appointments, etc.  Non-urgent messages can be sent to your provider as well.   To learn more about what you can do with MyChart, go to [email or web address].    Your next appointment:   March 5th at 2:00 PM with Darryle ONEIDA Decent, MD

## 2023-09-16 ENCOUNTER — Other Ambulatory Visit: Payer: Self-pay | Admitting: Medical

## 2023-09-17 ENCOUNTER — Ambulatory Visit: Payer: Medicare Other | Admitting: Family Medicine

## 2023-09-25 DIAGNOSIS — H43813 Vitreous degeneration, bilateral: Secondary | ICD-10-CM | POA: Diagnosis not present

## 2023-09-25 DIAGNOSIS — H26492 Other secondary cataract, left eye: Secondary | ICD-10-CM | POA: Diagnosis not present

## 2023-09-25 DIAGNOSIS — H353122 Nonexudative age-related macular degeneration, left eye, intermediate dry stage: Secondary | ICD-10-CM | POA: Diagnosis not present

## 2023-09-25 DIAGNOSIS — H04123 Dry eye syndrome of bilateral lacrimal glands: Secondary | ICD-10-CM | POA: Diagnosis not present

## 2023-09-25 DIAGNOSIS — H353111 Nonexudative age-related macular degeneration, right eye, early dry stage: Secondary | ICD-10-CM | POA: Diagnosis not present

## 2023-09-30 ENCOUNTER — Telehealth (INDEPENDENT_AMBULATORY_CARE_PROVIDER_SITE_OTHER): Payer: Self-pay | Admitting: Otolaryngology

## 2023-09-30 NOTE — Telephone Encounter (Signed)
LVM to confirm appt & location [id number] afm

## 2023-10-01 ENCOUNTER — Ambulatory Visit (INDEPENDENT_AMBULATORY_CARE_PROVIDER_SITE_OTHER): Payer: Medicare Other | Admitting: Otolaryngology

## 2023-10-01 ENCOUNTER — Encounter: Payer: Self-pay | Admitting: Cardiovascular Disease

## 2023-10-01 ENCOUNTER — Encounter (INDEPENDENT_AMBULATORY_CARE_PROVIDER_SITE_OTHER): Payer: Self-pay

## 2023-10-01 VITALS — BP 107/71 | HR 77 | Ht 63.0 in | Wt 98.0 lb

## 2023-10-01 DIAGNOSIS — H6123 Impacted cerumen, bilateral: Secondary | ICD-10-CM

## 2023-10-01 NOTE — Progress Notes (Signed)
Patient ID: Katrina Walters, female   DOB: [birth year], 68 y.o.   MRN: [id number]  Procedure: Bilateral cerumen disimpaction.    Indication: Cerumen impaction, resulting in ear discomfort and conductive hearing loss.    Description: The patient is placed supine on the exam table.  Under the operating microscope, the right ear canal is examined and is noted to be completely impacted with cerumen.  The cerumen is carefully removed with a combination of suction catheters, cerumen curette, and alligator forceps.  After the cerumen removal, the ear canal and tympanic membrane are noted to be normal.  No middle ear effusion is noted.  The same procedure is then repeated on the left side without exception. The patient tolerated the procedure well.  Follow-up care:  The patient is instructed not to use Q-tips to clean the ear canals.  The patient will follow up in 4 months.

## 2023-10-11 ENCOUNTER — Encounter: Payer: Self-pay | Admitting: Gastroenterology

## 2023-10-11 ENCOUNTER — Other Ambulatory Visit: Payer: Self-pay | Admitting: Medical

## 2023-10-11 ENCOUNTER — Other Ambulatory Visit: Payer: Self-pay | Admitting: Gastroenterology

## 2023-10-12 MED ORDER — LINACLOTIDE 290 MCG PO CAPS: 290.0000 ug | ORAL_CAPSULE | Freq: Every day | ORAL | 2 refills | Status: DC

## 2023-10-13 ENCOUNTER — Inpatient Hospital Stay: Payer: Medicare Other | Attending: Hematology & Oncology | Admitting: Medical Oncology

## 2023-10-13 ENCOUNTER — Inpatient Hospital Stay: Payer: Medicare Other

## 2023-10-13 ENCOUNTER — Encounter: Payer: Self-pay | Admitting: Medical Oncology

## 2023-10-13 VITALS — BP 108/64 | HR 80 | Temp 98.1°F | Resp 18 | Ht 63.0 in | Wt 98.0 lb

## 2023-10-13 DIAGNOSIS — D509 Iron deficiency anemia, unspecified: Secondary | ICD-10-CM

## 2023-10-13 DIAGNOSIS — D751 Secondary polycythemia: Secondary | ICD-10-CM

## 2023-10-13 DIAGNOSIS — D45 Polycythemia vera: Secondary | ICD-10-CM

## 2023-10-13 DIAGNOSIS — E875 Hyperkalemia: Secondary | ICD-10-CM

## 2023-10-13 LAB — CMP (CANCER CENTER ONLY)
ALT: 14 U/L (ref 0–44)
AST: 20 U/L (ref 15–41)
Albumin: 4.2 g/dL (ref 3.5–5.0)
Alkaline Phosphatase: 78 U/L (ref 38–126)
Anion gap: 7 (ref 5–15)
BUN: 11 mg/dL (ref 8–23)
CO2: 28 mmol/L (ref 22–32)
Calcium: 9.4 mg/dL (ref 8.9–10.3)
Chloride: 101 mmol/L (ref 98–111)
Creatinine: 0.82 mg/dL (ref 0.44–1.00)
GFR, Estimated: >60 mL/min (ref >60)
Glucose, Bld: 96 mg/dL (ref 70–99)
Potassium: 5.6 mmol/L — ABNORMAL HIGH (ref 3.5–5.1)
Sodium: 136 mmol/L (ref 135–145)
Total Bilirubin: 0.9 mg/dL (ref 0.0–1.2)
Total Protein: 7.4 g/dL (ref 6.5–8.1)

## 2023-10-13 LAB — CBC WITH DIFFERENTIAL (CANCER CENTER ONLY)
Abs Immature Granulocytes: 0.08 10*3/uL — ABNORMAL HIGH (ref 0.00–0.07)
Basophils Absolute: 0.1 10*3/uL (ref 0.0–0.1)
Basophils Relative: 1 %
Eosinophils Absolute: 0.5 10*3/uL (ref 0.0–0.5)
Eosinophils Relative: 3 %
HCT: 43 % (ref 36.0–46.0)
Hemoglobin: 12.3 g/dL (ref 12.0–15.0)
Immature Granulocytes: 0 %
Lymphocytes Relative: 12 %
Lymphs Abs: 2.1 10*3/uL (ref 0.7–4.0)
MCH: 18.6 pg — ABNORMAL LOW (ref 26.0–34.0)
MCHC: 28.6 g/dL — ABNORMAL LOW (ref 30.0–36.0)
MCV: 64.9 fL — ABNORMAL LOW (ref 80.0–100.0)
Monocytes Absolute: 1 10*3/uL (ref 0.1–1.0)
Monocytes Relative: 6 %
Neutro Abs: 14.1 10*3/uL — ABNORMAL HIGH (ref 1.7–7.7)
Neutrophils Relative %: 78 %
Platelet Count: 559 10*3/uL — ABNORMAL HIGH (ref 150–400)
RBC: 6.63 MIL/uL — ABNORMAL HIGH (ref 3.87–5.11)
RDW: 24.5 % — ABNORMAL HIGH (ref 11.5–15.5)
WBC Count: 17.8 10*3/uL — ABNORMAL HIGH (ref 4.0–10.5)
nRBC: 0 % (ref 0.0–0.2)

## 2023-10-13 NOTE — Progress Notes (Signed)
 Hematology and Oncology Follow Up Visit  Katrina Walters [id number] Sep 14, 1955 68 y.o. 10/13/2023   Principle Diagnosis:  Polycythemia Vera -- JAK2 (+)   Current Therapy:        Phlebotomy to maintain HCT below 45% Aspirin  81 mg PO daily               Interim History:  Katrina Walters is here today with her husband for follow-up.    Since our last visit she has been ok. She has noticed increased night sweats and generalized itching.   She continues to struggle with her H. Pylori infection. She was unable to be treated for this due to medication intolerance. She is being referred to [facility] by her PCP for further management. They are still waiting on her phone call from [facility]. She reports occasional shortness of breath and chest pain- she is being followed closely by cardiology for this and has an upcoming stress test in about 10 days.   Denies nausea and vomiting.  Weight is overall stable. No infections or fevers.   There has been no bleeding to her knowledge: denies epistaxis, gingivitis, hemoptysis, hematemesis, hematuria, melena, excessive bruising, blood donation.   Weight has trended down- suspected to be due to diet changes. She reports no new bone pain or unintentional weight loss.   Wt Readings from Last 3 Encounters:  10/13/23 98 lb (44.5 kg)  10/01/23 98 lb (44.5 kg)  09/15/23 99 lb 12.8 oz (45.3 kg)   ECOG Performance Status: 1 - Symptomatic but completely ambulatory  Medications:  Allergies as of 10/13/2023       Reactions   Amoxicillin     Had fever with flu like symptoms. Has patient had a PCN reaction causing immediate rash, facial/tongue/throat swelling, SOB or lightheadedness with hypotension: no Has patient had a PCN reaction causing severe rash involving mucus membranes or skin necrosis: no Has patient had a PCN reaction that required hospitalization no Has patient had a PCN reaction occurring within the last 10 years: yes If all of the above answers are NO, then may  proceed with Cephalosporin use.   Pravastatin  Itching        Medication List        Accurate as of October 13, 2023 10:11 AM. If you have any questions, ask your nurse or doctor.          STOP taking these medications    omeprazole  40 MG capsule Commonly known as: PRILOSEC Stopped by: Lauraine CHRISTELLA Dais   ondansetron  4 MG tablet Commonly known as: ZOFRAN  Stopped by: Lauraine CHRISTELLA Dais   traZODone  50 MG tablet Commonly known as: DESYREL  Stopped by: Lauraine CHRISTELLA Dais       TAKE these medications    aspirin  81 MG chewable tablet Chew by mouth daily.   BENEFIBER DRINK MIX PO Take by mouth daily at 6 (six) AM.   Bismuth  262 MG Chew Chew 524 mg by mouth in the morning, at noon, in the evening, and at bedtime.   colchicine  0.6 MG tablet Take 1 tablet (0.6 mg total) by mouth 2 (two) times daily.   Cranberry-Vitamin C 250-60 MG Caps Take 2 tablets by mouth daily.   D-MANNOSE PO Take 2 tablets by mouth daily.   diclofenac  75 MG EC tablet Commonly known as: VOLTAREN  Take 1 tablet (75 mg total) by mouth 2 (two) times daily.   diphenhydrAMINE  25 MG tablet Commonly known as: BENADRYL  Take 25 mg by mouth at bedtime.   ezetimibe  10  MG tablet Commonly known as: ZETIA  Take 1 tablet (10 mg total) by mouth daily. Please keep scheduled appointment for additional refills.   famotidine  20 MG tablet Commonly known as: PEPCID  Take 1 tablet (20 mg total) by mouth daily.   FIBER PO Take 2 tablets by mouth daily.   levocetirizine 5 MG tablet Commonly known as: XYZAL  Take 1 tablet (5 mg total) by mouth every evening.   linaclotide  290 MCG Caps capsule Commonly known as: LINZESS  Take 1 capsule (290 mcg total) by mouth daily before breakfast.   Lokelma  5 g packet Generic drug: sodium zirconium cyclosilicate  5 GRAM PACK TWICE A WEEK(TUESDAY AND THURSDAY)   mupirocin  ointment 2 % Commonly known as: BACTROBAN  Apply 1 Application topically 2 (two) times daily.    nitazoxanide  500 MG tablet Commonly known as: ALINIA  Take 1 tablet (500 mg total) by mouth 2 (two) times daily with a meal.   nitrofurantoin  (macrocrystal-monohydrate) 100 MG capsule Commonly known as: MACROBID  Take 1 tablet by mouth twice a day for 5 days.   NON FORMULARY Take 0.5 tablets by mouth at bedtime. CBD oil - for pain Gummies   pantoprazole  40 MG tablet Commonly known as: PROTONIX  Take 40 mg by mouth daily.   Premarin  vaginal cream Generic drug: conjugated estrogens  Place 0.25 Applicatorfuls vaginally See admin instructions. Insert 0.5 grams vaginally every other night   Prolia  60 MG/ML Sosy injection Generic drug: denosumab  INJECT 1 SYRINGE UNDER THE SKIN ONCE EVERY 6 MONTHS   rosuvastatin  10 MG tablet Commonly known as: CRESTOR  Take 1 tablet (10 mg total) by mouth daily.   SYSTANE OP Place 1 drop into both eyes in the morning and at bedtime.   triamcinolone  cream 0.1 % Commonly known as: KENALOG  Apply 1 Application topically 2 (two) times daily.        Allergies:  Allergies  Allergen Reactions   Amoxicillin      Had fever with flu like symptoms. Has patient had a PCN reaction causing immediate rash, facial/tongue/throat swelling, SOB or lightheadedness with hypotension: no Has patient had a PCN reaction causing severe rash involving mucus membranes or skin necrosis: no Has patient had a PCN reaction that required hospitalization no Has patient had a PCN reaction occurring within the last 10 years: yes If all of the above answers are NO, then may proceed with Cephalosporin use.   Pravastatin  Itching    Past Medical History, Surgical history, Social history, and Family History were reviewed and updated.  Review of Systems: All other 10 point review of systems is negative.   Physical Exam:  height is 5' 3 (1.6 m) and weight is 98 lb (44.5 kg). Her oral temperature is 98.1 F (36.7 C). Her blood pressure is 108/64 and her pulse is 80. Her  respiration is 18 and oxygen saturation is 100%.   Wt Readings from Last 3 Encounters:  10/13/23 98 lb (44.5 kg)  10/01/23 98 lb (44.5 kg)  09/15/23 99 lb 12.8 oz (45.3 kg)   Constitutional: Thin  Ocular: Sclerae unicteric, pupils equal, round and reactive to light Ear-nose-throat: Oropharynx clear, dentition fair Lymphatic: No cervical or supraclavicular adenopathy Lungs no rales or rhonchi, good excursion bilaterally Heart regular rate and rhythm, no murmur appreciated.  Abd soft, nontender, positive bowel sounds MSK no focal spinal tenderness, no joint edema Neuro: non-focal, well-oriented, appropriate affect   Lab Results  Component Value Date   WBC 17.8 (H) 10/13/2023   HGB 12.3 10/13/2023   HCT 43.0 10/13/2023   MCV  64.9 (L) 10/13/2023   PLT 559 (H) 10/13/2023   Lab Results  Component Value Date   FERRITIN 6 (L) 06/12/2023   IRON  31 06/12/2023   TIBC 440 06/12/2023   UIBC 409 06/12/2023   IRONPCTSAT 7 (L) 06/12/2023   Lab Results  Component Value Date   RETICCTPCT 1.5 11/24/2022   RBC 6.63 (H) 10/13/2023   No results found for: KPAFRELGTCHN, LAMBDASER, KAPLAMBRATIO No results found for: IGGSERUM, IGA, IGMSERUM No results found for: STEPHANY CARLOTA BENSON MARKEL EARLA JOANNIE DOC VICK, SPEI   Chemistry      Component Value Date/Time   NA 136 10/13/2023 0904   NA 139 04/15/2021 1027   K 5.6 (H) 10/13/2023 0904   CL 101 10/13/2023 0904   CO2 28 10/13/2023 0904   BUN 11 10/13/2023 0904   BUN 14 04/15/2021 1027   CREATININE 0.82 10/13/2023 0904   CREATININE 0.84 07/14/2016 0914      Component Value Date/Time   CALCIUM  9.4 10/13/2023 0904   ALKPHOS 78 10/13/2023 0904   AST 20 10/13/2023 0904   ALT 14 10/13/2023 0904   BILITOT 0.9 10/13/2023 0904      Encounter Diagnoses  Name Primary?   Polycythemia vera (HCC) Yes   Iron  deficiency anemia, unspecified iron  deficiency anemia type    Hyperkalemia     Erythrocytosis     Impression and Plan: Ms. Berkland is a very pleasant 68 yo caucasian female with polycythemia vera, JAK 2 positive. She also has erythrocytosis and IDA. Plan is to have her come in every 4 weeks for lab and possible phlebotomy if hematocrit is above 45%.   Her hematocrit is 43% today and will hold off on phlebotomy. She also has a chronically elevated WBC and elevated platelets. Elevated platelets suspected to be due to iron  deficiency s/p phlebotomy for her PV management. Neutrophilia likely secondary to her PV thought could be related to her current H. Pylori infection. Unfortunately her WBC count has risen by quite a bit since her last visit. She is increasingly symptomatic with night sweats and itching as well. Our plan is to do additional labs and repeat a CBC in 2 weeks. If abnormal a bone marrow biopsy may be recommended.  She will also continue working on a low potassium diet for her hypokalemia.   No phlebotomy today RTC 2 weeks labs, APP (CBC w/, retic, CMP, smear, LDH, CRP, sed ) RTC 1 month labs, +- phlebotomy RTC 2 months labs, +- phlebotomy RTC 3 months APP, labs, +- phlebotomy -[place]    Lauraine CHRISTELLA Dais, PA-C 2/4/202510:11 AM

## 2023-10-23 ENCOUNTER — Encounter: Payer: Self-pay | Admitting: Obstetrics and Gynecology

## 2023-10-23 ENCOUNTER — Ambulatory Visit: Payer: Medicare Other | Admitting: Obstetrics and Gynecology

## 2023-10-23 ENCOUNTER — Other Ambulatory Visit (HOSPITAL_COMMUNITY)
Admission: RE | Admit: 2023-10-23 | Discharge: 2023-10-23 | Disposition: A | Discharge status: Home / Self Care | Payer: Medicare Other | Source: Ambulatory Visit | Attending: Obstetrics and Gynecology | Admitting: Obstetrics and Gynecology

## 2023-10-23 VITALS — BP 121/79 | HR 80 | Ht 63.0 in | Wt 99.0 lb

## 2023-10-23 DIAGNOSIS — R82998 Other abnormal findings in urine: Secondary | ICD-10-CM | POA: Diagnosis not present

## 2023-10-23 DIAGNOSIS — R300 Dysuria: Secondary | ICD-10-CM | POA: Insufficient documentation

## 2023-10-23 DIAGNOSIS — N952 Postmenopausal atrophic vaginitis: Secondary | ICD-10-CM | POA: Diagnosis present

## 2023-10-23 DIAGNOSIS — N898 Other specified noninflammatory disorders of vagina: Secondary | ICD-10-CM | POA: Diagnosis not present

## 2023-10-23 LAB — POCT URINALYSIS DIP (CLINITEK)
Bilirubin, UA: NEGATIVE
Blood, UA: NEGATIVE
Glucose, UA: NEGATIVE mg/dL
Ketones, POC UA: NEGATIVE mg/dL
Nitrite, UA: NEGATIVE
POC PROTEIN,UA: NEGATIVE
Spec Grav, UA: 1.015 (ref 1.010–1.025)
Urobilinogen, UA: 0.2 U/dL
pH, UA: 5.5 (ref 5.0–8.0)

## 2023-10-23 NOTE — Assessment & Plan Note (Signed)
-   not symptomatic for UTI today and had two recent negative cultures.  - We discussed to continue with macrobid post-coital prophylaxis (can provide refill when needed).  - Continue with d-mannose and can also add cranberry pill for UTI prevention.  - When she does have symptoms of a UTI, recommended coming to the office to provide a urine sample so we can track her symptoms better.

## 2023-10-23 NOTE — Assessment & Plan Note (Signed)
-   continue with premarin cream every other day.  - Also recommended using additional vaginal moisturizer such as coconut or olive oil or vitamin E as burning irritation may be related to dryness.

## 2023-10-23 NOTE — Progress Notes (Signed)
New Patient Evaluation and Consultation  Referring Provider: Levie Heritage, DO PCP: Marisue Brooklyn Date of Service: 10/23/2023  SUBJECTIVE Chief Complaint: New Patient (Initial Visit) (Dysuria taking D-mannose - it does help - history of hyst with A&P repair in 2020 at [facility] records in Care Everywhere)  History of Present Illness: Katrina Walters is a 68 y.o. White or Caucasian female seen in consultation at the request of Dr Adrian Blackwater for evaluation of dysuria.    Review of records significant for: Previously seen at [facility] (Dr Jimmey RalphCherlynn Polo). Had been on suppressive therapy for recurrent UTIs with 100mg  pot coital macrodantin. S/p TVH, USLS, A&P repair in 2020.   Urinary Symptoms: Does not leak urine.   Day time voids 4-5.  Nocturia: 1-2 times per night to void. Voiding dysfunction:  empties bladder well.  Patient does not use a catheter to empty bladder.  When urinating, patient feels a weak stream, dribbling after finishing, and to push on her belly or vagina to empty bladder Drinks: 2cups of coffee, 4 bottles water per day (sometimes drinks carbonated beverages)  UTIs: 4 UTI's in the last year.  Has some burning on and off, about every few days. She used to take d-mannose and restarted again a few weeks ago. Does not feel any symptoms today.  She is using premarin cream every other day. Notices less burning when she uses the cream. Denies itching or discharge.  Feels burning after she has coffee in the morning.  Still takes macrodantin after intercourse.  Denies history of blood in urine and kidney or bladder stones  Urine cultures:  08/12/23- mixed urogenital flora 06/24/23- no growth   Pelvic Organ Prolapse Symptoms:                  Patient Denies a feeling of a bulge the vaginal area.   Bowel Symptom: Bowel movements: 1 time(s) every 3-4 days then will go multiple times in a day(has IBS) Stool consistency: soft  Straining: no.  Splinting: no.  Incomplete  evacuation: no.  Patient Denies accidental bowel leakage / fecal incontinence Bowel regimen: fiber, stool softener, miralax, and linzess  HM Colonoscopy          Upcoming     Colonoscopy (Every 7 Years) Next due on 03/31/2027    03/30/2020  COLONOSCOPY   Only the first 1 history entries have been loaded, but more history exists.                Sexual Function Sexually active: yes.  Sexual orientation: Straight Pain with sex: Yes, at the vaginal opening, has discomfort due to dryness  Pelvic Pain Denies pelvic pain  Past Medical History:  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 69 years in [place], managed on colchicine   Osteoporosis 09/2016   Peripheral arterial disease (HCC)    Polycythemia vera (HCC)    PONV (postoperative nausea and vomiting)      Past Surgical History:   Past Surgical History:  Procedure Laterality Date   ANAL RECTAL MANOMETRY N/A 11/12/2020   Procedure: ANO RECTAL MANOMETRY;  Surgeon: Shellia Cleverly, DO;  Location:  [facility];  Service: Gastroenterology;  Laterality: N/A;   BREAST REDUCTION SURGERY  2006   CARDIAC CATHETERIZATION N/A 07/17/2016   Procedure: Left Heart Cath and Coronary Angiography;  Surgeon: Kathleene Hazel, MD;  Location: [facility] INVASIVE CV LAB;  Service: Cardiovascular;  Laterality: N/A;   CATARACT EXTRACTION W/ INTRAOCULAR LENS IMPLANT Bilateral    COLONOSCOPY  2015   CORONARY ARTERY BYPASS GRAFT N/A 06/04/2021   Procedure: CORONARY ARTERY BYPASS GRAFTING (CABG), ON PUMP, TIMES FOUR, USING LEFT INTERNAL MAMMARY ARTERY, LEFT RADIAL ARTERY (HARVESTED OPEN), AND RIGHT GREATER SAPHENOUS VEIN (HARVESTED ENDOSCOPICALLY);  Surgeon: Corliss Skains, MD;  Location[facility];  Service: Open Heart Surgery;  Laterality: N/A;   EYE SURGERY     LEFT HEART CATH AND CORONARY ANGIOGRAPHY N/A 04/22/2021   Procedure: LEFT HEART CATH AND CORONARY ANGIOGRAPHY;  Surgeon: Lyn Records, MD;  Location: [facility] INVASIVE CV LAB;  Service: Cardiovascular;  Laterality: N/A;   POLYPECTOMY  2015   RADIAL ARTERY HARVEST Left 06/04/2021   Procedure: RADIAL ARTERY HARVEST;  Surgeon: Corliss Skains, MD;  Location: [facility] OR;  Service: Open Heart Surgery;  Laterality: Left;   TEE WITHOUT CARDIOVERSION N/A 06/04/2021   Procedure: TRANSESOPHAGEAL ECHOCARDIOGRAM (TEE);  Surgeon: Corliss Skains, MD;  Location: [facility] OR;  Service: Open Heart Surgery;  Laterality: N/A;   TONSILLECTOMY     VAGINAL HYSTERECTOMY  08/2019   w/ apical suspension, A&P repair 08/2019 at [facility]     Past OB/GYN History: OB History  Gravida Para Term Preterm AB Living  3 1 1  2 1   SAB IAB Ectopic Multiple Live Births  2    1    # Outcome Date GA Lbr Len/2nd Weight Sex Type Anes PTL Lv  3 Term      Vag-Spont     2 SAB           1 SAB             S/p hysterectomy No results found for: "DIAGPAP", "HPVHIGH", "ADEQPAP"  Medications: Patient has a current medication list which includes the following prescription(s): aspirin, bismuth, colchicine, cranberry-vitamin c, d-mannose, prolia, diclofenac, diphenhydramine, ezetimibe, famotidine, fiber, levocetirizine, linaclotide, lokelma, mupirocin ointment, nitazoxanide, NON FORMULARY, pantoprazole, polyethyl glycol-propyl glycol, premarin, rosuvastatin, triamcinolone cream, and wheat dextrin, and the following Facility-Administered Medications: [START ON 02/18/2024] denosumab.   Allergies: Patient is allergic to amoxicillin and pravastatin.   Social History:  Social History   Tobacco Use   Smoking status: Never   Smokeless tobacco: Never  Vaping Use   Vaping status: Never Used  Substance Use Topics   Alcohol use: Yes    Alcohol/week: 1.0  standard drink of alcohol    Types: 1 Glasses of wine per week    Comment: occassionally   Drug use: No    Comment: CBD gummies    Relationship status: married Patient lives with her husband.   Patient is not employed. Regular exercise: Yes: walking on treadmill 3 times a week History of abuse: No  Family History:   Family History  Problem Relation Age of Onset   Heart disease Mother        died at 58 of heart attack   Hypertension Mother    Heart attack Mother    Heart disease Father        had heart attack at age 29   Heart attack Father    Rectal cancer Father 3   Hearing loss Father    Cancer Paternal  Aunt    Stomach cancer Paternal Grandmother    Colon cancer Neg Hx    Colon polyps Neg Hx    Esophageal cancer Neg Hx      Review of Systems: Review of Systems  Constitutional:  Positive for malaise/fatigue. Negative for fever and weight loss.  Respiratory:  Negative for cough, shortness of breath and wheezing.   Cardiovascular:  Negative for chest pain, palpitations and leg swelling.  Gastrointestinal:  Negative for abdominal pain and blood in stool.  Genitourinary:  Negative for dysuria.  Musculoskeletal:  Negative for myalgias.  Skin:  Negative for rash.  Neurological:  Negative for dizziness and headaches.  Endo/Heme/Allergies:  Bruises/bleeds easily.       + hot flashes  Psychiatric/Behavioral:  Negative for depression. The patient is not nervous/anxious.      OBJECTIVE Physical Exam: Vitals:   10/23/23 1007  BP: 121/79  Pulse: 80  Weight: 99 lb (44.9 kg)  Height: 5\' 3"  (1.6 m)    Physical Exam Vitals reviewed. Exam conducted with a chaperone present.  Constitutional:      General: She is not in acute distress. Pulmonary:     Effort: Pulmonary effort is normal.  Abdominal:     General: There is no distension.     Palpations: Abdomen is soft.     Tenderness: There is no abdominal tenderness. There is no rebound.  Musculoskeletal:         General: No swelling. Normal range of motion.  Skin:    General: Skin is warm and dry.     Findings: No rash.  Neurological:     Mental Status: She is alert and oriented to person, place, and time.  Psychiatric:        Mood and Affect: Mood normal.        Behavior: Behavior normal.      GU / Detailed Urogynecologic Evaluation:  Pelvic Exam: Normal external female genitalia; Bartholin's and Skene's glands normal in appearance; urethral meatus normal in appearance, no urethral masses or discharge.   CST: negative  s/p hysterectomy: Speculum exam reveals normal vaginal mucosa with  atrophy and normal vaginal cuff.  Adnexa not palpable  Pelvic floor strength I/V  Pelvic floor musculature: Right levator non-tender, Right obturator non-tender, Left levator non-tender, Left obturator non-tender  POP-Q:   POP-Q  -2.5                                            Aa   -2.5                                           Ba  -6.5                                              C   2.5                                            Gh  3.5  Pb  7                                            tvl   -2.5                                            Ap  -2.5                                            Bp                                                 D      Rectal Exam:  Normal external rectum  Post-Void Residual (PVR) by Bladder Scan: In order to evaluate bladder emptying, we discussed obtaining a postvoid residual and patient agreed to this procedure.  Procedure: The ultrasound unit was placed on the patient's abdomen in the suprapubic region after the patient had voided.    Post Void Residual - 10/23/23 1018       Post Void Residual   Post Void Residual 4 mL              Laboratory Results: Lab Results  Component Value Date   COLORU light yellow (A) 10/23/2023   CLARITYU clear 10/23/2023   GLUCOSEUR negative 10/23/2023    BILIRUBINUR negative 10/23/2023   KETONESU negative 08/12/2023   SPECGRAV 1.015 10/23/2023   RBCUR negative 10/23/2023   PHUR 5.5 10/23/2023   PROTEINUR Negative 08/12/2023   UROBILINOGEN 0.2 10/23/2023   LEUKOCYTESUR Small (1+) (A) 10/23/2023    Lab Results  Component Value Date   CREATININE 0.82 10/13/2023   CREATININE 0.77 08/10/2023   CREATININE 0.82 07/24/2023    Lab Results  Component Value Date   HGBA1C 6.0 05/14/2023    Lab Results  Component Value Date   HGB 12.3 10/13/2023     ASSESSMENT AND PLAN Ms. Sinkler is a 68 y.o. with:  1. Dysuria   2. Leukocytes in urine   3. Vaginal atrophy   4. Vaginal discharge     Dysuria Assessment & Plan: - Feels most symptomatic after her morning coffee. We discussed that the acidity of some beverages can cause irritation to the bladder.  - Recommend avoiding caffeine and acidic beverages. If she does have coffee, can take prelief or plain tums prior in order to reduce irritation.   Orders: -     POCT URINALYSIS DIP (CLINITEK)  Leukocytes in urine Assessment & Plan: - not symptomatic for UTI today and had two recent negative cultures.  - We discussed to continue with macrobid post-coital prophylaxis (can provide refill when needed).  - Continue with d-mannose and can also add cranberry pill for UTI prevention.  - When she does have symptoms of a UTI, recommended coming to the office to provide a urine sample so we can track her symptoms better.    Vaginal atrophy Assessment & Plan: - continue with premarin cream every other day.  - Also recommended using additional  vaginal moisturizer such as coconut or olive oil or vitamin E as burning irritation may be related to dryness.   Orders: -     Cervicovaginal ancillary only  Vaginal discharge -     Cervicovaginal ancillary only   Return 3 months or sooner if needed  Marguerita Beards, MD

## 2023-10-23 NOTE — Patient Instructions (Addendum)
Today we talked about ways to manage bladder urgency such as altering your diet to avoid irritative beverages and foods (bladder diet) as well as attempting to decrease stress and other exacerbating factors.  You can also take Prelief (over the counter) or chew a plain Tums 1-3 times per day to make your urine less acidic, especially if you have eating/drinking acidic things.    The Most Bothersome Foods* The Least Bothersome Foods*  Coffee - Regular & Decaf Tea - caffeinated Carbonated beverages - cola, non-colas, diet & caffeine-free Alcohols - Beer, Red Wine, White Wine, [address] - Grapefruit, Avalon, Orange, [employer] - Cranberry, Grapefruit, Orange, Pineapple Vegetables - Tomato & Tomato Products Flavor Enhancers - Hot peppers, Spicy foods, Chili, Horseradish, Vinegar, Monosodium glutamate (MSG) Artificial Sweeteners - NutraSweet, Sweet 'N Low, Equal (sweetener), Saccharin Ethnic foods - [place], [place], [place] food [employer] - low-fat & whole Fruits - Bananas, Blueberries, Honeydew melon, Pears, Raisins, Watermelon Vegetables - Broccoli, [address] Sprouts, Arboles, Carrots, Cauliflower, [place], Cucumber, Mushrooms, Peas, Radishes, Squash, Zucchini, White potatoes, Sweet potatoes & yams Poultry - Chicken, Eggs, [place], [employer] - Beef, Diplomatic Services operational officer, Lamb Seafood - Shrimp, Cale fish, Salmon Grains - Oat, Rice Snacks - Pretzels, Popcorn  *Lenward Chancellor et al. Diet and its role in interstitial cystitis/bladder pain syndrome (IC/BPS) and comorbid conditions. [employer]U [employer]. BJU Int. 2012 Jan 11.   Vulvovaginal moisturizer Options: Vitamin E oil (pump or capsule) or cream (Gene's Vit E Cream) Coconut oil or olive oil V-magic (through [employer]) Silicone-based lubricant for use during intercourse ("wet platinum" is a brand available at most drugstores) Crisco Consider the ingredients of the product - the fewer the ingredients the better!  Directions for Use: Clean and dry your  hands Gently dab the vulvar/vaginal area dry as needed Apply a "pea-sized" amount of the moisturizer onto your fingertip Using you other hand, open the labia  Apply the moisturizer to the vulvar/vaginal tissues Wear loose fitting underwear/clothing if possible following application Use moisturize up to 3 times daily as desired.

## 2023-10-23 NOTE — Assessment & Plan Note (Signed)
-   Feels most symptomatic after her morning coffee. We discussed that the acidity of some beverages can cause irritation to the bladder.  - Recommend avoiding caffeine and acidic beverages. If she does have coffee, can take prelief or plain tums prior in order to reduce irritation.

## 2023-10-24 ENCOUNTER — Encounter: Payer: Self-pay | Admitting: Obstetrics and Gynecology

## 2023-10-26 LAB — CERVICOVAGINAL ANCILLARY ONLY
Bacterial Vaginitis (gardnerella): NEGATIVE
Candida Glabrata: NEGATIVE
Candida Vaginitis: NEGATIVE
Comment: NEGATIVE
Comment: NEGATIVE
Comment: NEGATIVE

## 2023-10-26 NOTE — Telephone Encounter (Signed)
 Attempted to contact patient. LVMTRC

## 2023-10-27 ENCOUNTER — Encounter: Payer: Self-pay | Admitting: Medical Oncology

## 2023-10-27 ENCOUNTER — Inpatient Hospital Stay: Payer: Medicare Other

## 2023-10-27 ENCOUNTER — Inpatient Hospital Stay (HOSPITAL_BASED_OUTPATIENT_CLINIC_OR_DEPARTMENT_OTHER): Payer: Medicare Other | Admitting: Medical Oncology

## 2023-10-27 VITALS — BP 119/69 | HR 78 | Temp 97.9°F | Resp 19 | Ht 63.0 in

## 2023-10-27 DIAGNOSIS — D751 Secondary polycythemia: Secondary | ICD-10-CM

## 2023-10-27 DIAGNOSIS — D509 Iron deficiency anemia, unspecified: Secondary | ICD-10-CM | POA: Diagnosis not present

## 2023-10-27 DIAGNOSIS — E875 Hyperkalemia: Secondary | ICD-10-CM

## 2023-10-27 DIAGNOSIS — D45 Polycythemia vera: Secondary | ICD-10-CM | POA: Diagnosis not present

## 2023-10-27 LAB — IRON AND IRON BINDING CAPACITY (CC-WL,HP ONLY)
Iron: 23 ug/dL — ABNORMAL LOW (ref 28–170)
Saturation Ratios: 5 % — ABNORMAL LOW (ref 10.4–31.8)
TIBC: 434 ug/dL (ref 250–450)
UIBC: 411 ug/dL (ref 148–442)

## 2023-10-27 LAB — CMP (CANCER CENTER ONLY)
ALT: 20 U/L (ref 0–44)
AST: 24 U/L (ref 15–41)
Albumin: 4.3 g/dL (ref 3.5–5.0)
Alkaline Phosphatase: 79 U/L (ref 38–126)
Anion gap: 8 (ref 5–15)
BUN: 13 mg/dL (ref 8–23)
CO2: 31 mmol/L (ref 22–32)
Calcium: 9.8 mg/dL (ref 8.9–10.3)
Chloride: 102 mmol/L (ref 98–111)
Creatinine: 0.75 mg/dL (ref 0.44–1.00)
GFR, Estimated: >60 mL/min (ref >60)
Glucose, Bld: 87 mg/dL (ref 70–99)
Potassium: 4.1 mmol/L (ref 3.5–5.1)
Sodium: 141 mmol/L (ref 135–145)
Total Bilirubin: 0.6 mg/dL (ref 0.0–1.2)
Total Protein: 7.3 g/dL (ref 6.5–8.1)

## 2023-10-27 LAB — CBC WITH DIFFERENTIAL (CANCER CENTER ONLY)
Abs Immature Granulocytes: 0.09 10*3/uL — ABNORMAL HIGH (ref 0.00–0.07)
Basophils Absolute: 0.1 10*3/uL (ref 0.0–0.1)
Basophils Relative: 1 %
Eosinophils Absolute: 0.3 10*3/uL (ref 0.0–0.5)
Eosinophils Relative: 2 %
HCT: 43.3 % (ref 36.0–46.0)
Hemoglobin: 12.2 g/dL (ref 12.0–15.0)
Immature Granulocytes: 1 %
Lymphocytes Relative: 16 %
Lymphs Abs: 2.2 10*3/uL (ref 0.7–4.0)
MCH: 18.6 pg — ABNORMAL LOW (ref 26.0–34.0)
MCHC: 28.2 g/dL — ABNORMAL LOW (ref 30.0–36.0)
MCV: 66 fL — ABNORMAL LOW (ref 80.0–100.0)
Monocytes Absolute: 0.7 10*3/uL (ref 0.1–1.0)
Monocytes Relative: 5 %
Neutro Abs: 10.4 10*3/uL — ABNORMAL HIGH (ref 1.7–7.7)
Neutrophils Relative %: 75 %
Platelet Count: 572 10*3/uL — ABNORMAL HIGH (ref 150–400)
RBC: 6.56 MIL/uL — ABNORMAL HIGH (ref 3.87–5.11)
RDW: 25.5 % — ABNORMAL HIGH (ref 11.5–15.5)
WBC Count: 13.8 10*3/uL — ABNORMAL HIGH (ref 4.0–10.5)
nRBC: 0 % (ref 0.0–0.2)

## 2023-10-27 LAB — LACTATE DEHYDROGENASE: LDH: 135 U/L (ref 98–192)

## 2023-10-27 LAB — VITAMIN B12: Vitamin B-12: 521 pg/mL (ref 180–914)

## 2023-10-27 LAB — RETIC PANEL
Immature Retic Fract: 22 % — ABNORMAL HIGH (ref 2.3–15.9)
RBC.: 6.58 MIL/uL — ABNORMAL HIGH (ref 3.87–5.11)
Retic Count, Absolute: 108.6 10*3/uL (ref 19.0–186.0)
Retic Ct Pct: 1.7 % (ref 0.4–3.1)
Reticulocyte Hemoglobin: 19.2 pg — ABNORMAL LOW (ref >27.9)

## 2023-10-27 LAB — FERRITIN: Ferritin: 6 ng/mL — ABNORMAL LOW (ref 11–307)

## 2023-10-27 LAB — SEDIMENTATION RATE: Sed Rate: 3 mm/h (ref 0–22)

## 2023-10-27 LAB — SAVE SMEAR(SSMR), FOR PROVIDER SLIDE REVIEW

## 2023-10-27 LAB — C-REACTIVE PROTEIN: CRP: 1.9 mg/dL — ABNORMAL HIGH (ref <1.0)

## 2023-10-27 LAB — FOLATE: Folate: 9.4 ng/mL (ref >5.9)

## 2023-10-27 NOTE — Telephone Encounter (Signed)
Patient stated she is doing much better. She was offered an appointment for nurse visit but declined. Patient said she will contact us if any symptoms start to worsen.

## 2023-10-27 NOTE — Progress Notes (Signed)
Hematology and Oncology Follow Up Visit  Katrina Walters [id number] 19-Jul-1956 68 y.o. 10/27/2023   Principle Diagnosis:  Polycythemia Vera -- JAK2 (+)   Current Therapy:        Phlebotomy to maintain HCT below 45% Aspirin 81 mg PO daily               Interim History:  Katrina Walters is here today with her husband for follow-up.    Since our last visit she has been ok. She has noticed increased night sweats and generalized itching.   She continues to struggle with her H. Pylori infection. She was unable to be treated for this due to medication intolerance. She is being referred to [facility] by her PCP for further management. They are still waiting on her phone call from [facility]. She reports occasional shortness of breath and chest pain- she is being followed closely by cardiology for this and has an upcoming stress test in about 10 days.   Denies nausea and vomiting.  Weight is overall stable. No infections or fevers.   There has been no bleeding to her knowledge: denies epistaxis, gingivitis, hemoptysis, hematemesis, hematuria, melena, excessive bruising, blood donation.   Weight has trended down- suspected to be due to diet changes. She reports no new bone pain or unintentional weight loss.   Wt Readings from Last 3 Encounters:  10/23/23 99 lb (44.9 kg)  10/13/23 98 lb (44.5 kg)  10/01/23 98 lb (44.5 kg)   ECOG Performance Status: 1 - Symptomatic but completely ambulatory  Medications:  Allergies as of 10/27/2023       Reactions   Amoxicillin    Had fever with flu like symptoms. Has patient had a PCN reaction causing immediate rash, facial/tongue/throat swelling, SOB or lightheadedness with hypotension: no Has patient had a PCN reaction causing severe rash involving mucus membranes or skin necrosis: no Has patient had a PCN reaction that required hospitalization no Has patient had a PCN reaction occurring within the last 10 years: yes If all of the above answers are "NO", then may proceed  with Cephalosporin use.   Pravastatin Itching        Medication List        Accurate as of October 27, 2023 11:02 AM. If you have any questions, ask your nurse or doctor.          aspirin 81 MG chewable tablet Chew by mouth daily.   BENEFIBER DRINK MIX PO Take by mouth daily at 6 (six) AM.   Bismuth 262 MG Chew Chew 524 mg by mouth in the morning, at noon, in the evening, and at bedtime.   colchicine 0.6 MG tablet Take 1 tablet (0.6 mg total) by mouth 2 (two) times daily.   Cranberry-Vitamin C 250-60 MG Caps Take 2 tablets by mouth daily.   D-MANNOSE PO Take 2 tablets by mouth daily.   diclofenac 75 MG EC tablet Commonly known as: VOLTAREN Take 1 tablet (75 mg total) by mouth 2 (two) times daily.   diphenhydrAMINE 25 MG tablet Commonly known as: BENADRYL Take 25 mg by mouth at bedtime.   ezetimibe 10 MG tablet Commonly known as: ZETIA Take 1 tablet (10 mg total) by mouth daily. Please keep scheduled appointment for additional refills.   famotidine 20 MG tablet Commonly known as: PEPCID Take 1 tablet (20 mg total) by mouth daily.   FIBER PO Take 2 tablets by mouth daily.   levocetirizine 5 MG tablet Commonly known as: XYZAL Take 1 tablet (  5 mg total) by mouth every evening.   linaclotide 290 MCG Caps capsule Commonly known as: LINZESS Take 1 capsule (290 mcg total) by mouth daily before breakfast.   Lokelma 5 g packet Generic drug: sodium zirconium cyclosilicate 5 GRAM PACK TWICE A WEEK(TUESDAY AND THURSDAY)   mupirocin ointment 2 % Commonly known as: BACTROBAN Apply 1 Application topically 2 (two) times daily.   nitazoxanide 500 MG tablet Commonly known as: ALINIA Take 1 tablet (500 mg total) by mouth 2 (two) times daily with a meal.   NON FORMULARY Take 0.5 tablets by mouth at bedtime. CBD oil - for pain Gummies   pantoprazole 40 MG tablet Commonly known as: PROTONIX Take 40 mg by mouth daily.   Premarin vaginal cream Generic drug:  conjugated estrogens Place 0.25 Applicatorfuls vaginally See admin instructions. Insert 0.5 grams vaginally every other night   Prolia 60 MG/ML Sosy injection Generic drug: denosumab INJECT 1 SYRINGE UNDER THE SKIN ONCE EVERY 6 MONTHS   rosuvastatin 10 MG tablet Commonly known as: CRESTOR Take 1 tablet (10 mg total) by mouth daily.   SYSTANE OP Place 1 drop into both eyes in the morning and at bedtime.   triamcinolone cream 0.1 % Commonly known as: KENALOG Apply 1 Application topically 2 (two) times daily.        Allergies:  Allergies  Allergen Reactions   Amoxicillin     Had fever with flu like symptoms. Has patient had a PCN reaction causing immediate rash, facial/tongue/throat swelling, SOB or lightheadedness with hypotension: no Has patient had a PCN reaction causing severe rash involving mucus membranes or skin necrosis: no Has patient had a PCN reaction that required hospitalization no Has patient had a PCN reaction occurring within the last 10 years: yes If all of the above answers are "NO", then may proceed with Cephalosporin use.   Pravastatin Itching    Past Medical History, Surgical history, Social history, and Family History were reviewed and updated.  Review of Systems: All other 10 point review of systems is negative.   Physical Exam:  height is 5\' 3"  (1.6 m). Her oral temperature is 97.9 F (36.6 C). Her blood pressure is 119/69 and her pulse is 78. Her respiration is 19 and oxygen saturation is 100%.   Wt Readings from Last 3 Encounters:  10/23/23 99 lb (44.9 kg)  10/13/23 98 lb (44.5 kg)  10/01/23 98 lb (44.5 kg)   Constitutional: Thin  Ocular: Sclerae unicteric, pupils equal, round and reactive to light Ear-nose-throat: Oropharynx clear, dentition fair Lymphatic: No cervical or supraclavicular adenopathy Lungs no rales or rhonchi, good excursion bilaterally Heart regular rate and rhythm, no murmur appreciated.  Abd soft, nontender, positive  bowel sounds MSK no focal spinal tenderness, no joint edema Neuro: non-focal, well-oriented, appropriate affect   Lab Results  Component Value Date   WBC 13.8 (H) 10/27/2023   HGB 12.2 10/27/2023   HCT 43.3 10/27/2023   MCV 66.0 (L) 10/27/2023   PLT 572 (H) 10/27/2023   Lab Results  Component Value Date   FERRITIN 6 (L) 06/12/2023   IRON 31 06/12/2023   TIBC 440 06/12/2023   UIBC 409 06/12/2023   IRONPCTSAT 7 (L) 06/12/2023   Lab Results  Component Value Date   RETICCTPCT 1.7 10/27/2023   RBC 6.56 (H) 10/27/2023   RBC 6.58 (H) 10/27/2023   No results found for: "KPAFRELGTCHN", "LAMBDASER", "KAPLAMBRATIO" No results found for: "IGGSERUM", "IGA", "IGMSERUM" No results found for: "TOTALPROTELP", "ALBUMINELP", "A1GS", "A2GS", "BETS", "BETA2SER", "  GAMS", "MSPIKE", "SPEI"   Chemistry      Component Value Date/Time   NA 141 10/27/2023 1008   NA 139 04/15/2021 1027   K 4.1 10/27/2023 1008   CL 102 10/27/2023 1008   CO2 31 10/27/2023 1008   BUN 13 10/27/2023 1008   BUN 14 04/15/2021 1027   CREATININE 0.75 10/27/2023 1008   CREATININE 0.84 07/14/2016 0914      Component Value Date/Time   CALCIUM 9.8 10/27/2023 1008   ALKPHOS 79 10/27/2023 1008   AST 24 10/27/2023 1008   ALT 20 10/27/2023 1008   BILITOT 0.6 10/27/2023 1008      No diagnosis found.   Impression and Plan: Ms. Prichett is a very pleasant 68 yo caucasian female with polycythemia vera, JAK 2 positive. She also has erythrocytosis and IDA. Plan is to have her come in every 4 weeks for lab and possible phlebotomy if hematocrit is above 45%.   Her hematocrit is 43.3% today and will hold off on phlebotomy. She also has a chronically elevated WBC and elevated platelets. Elevated platelets suspected to be due to iron deficiency s/p phlebotomy for her PV management. Neutrophilia likely secondary to her PV thought could be related to her current H. Pylori infection. Fortunately her WBC count is 13.8 down from 17.8  CMP  looks good today   No phlebotomy today RTC 2 months APP, labs(CBC w/, CMP, LDH, Iron, ferritin), +- phlebotomy -[place]    Rushie Chestnut, PA-C 2/18/202511:02 AM

## 2023-10-27 NOTE — Telephone Encounter (Signed)
#  2 attempted to contact patient. LVMTRC

## 2023-11-08 NOTE — Progress Notes (Unsigned)
 Cardiology Office Note:  .   Date:  11/11/2023  ID:  Katrina Walters, DOB [birth year], MRN [id number] PCP: Katrina Walters  [facility] Providers Cardiologist:  Reatha Harps, MD    History of Present Illness: .    Chief Complaint  Patient presents with   Follow-up         Katrina Walters is a 68 y.o. female with history of CAD s/p CABG, HLD, PV who presents for follow-up.    History of Present Illness   Katrina Walters is a 68 year old female with coronary artery disease s/p CABG who presents for follow-up of chest discomfort.  She experiences episodes of chest discomfort described as 'achiness and pressure' in the left chest. These episodes occur after exercise, specifically several hours after walking two and a half to three miles per day. The discomfort can last several hours and sometimes extends into the next day. It is not sharp but feels like 'somebody pushing on you.' She experiences difficulty taking deep breaths during these episodes. The discomfort occurs approximately once every two weeks, and she has not experienced it in the past two weeks, coinciding with a period of reduced physical activity. No specific factors worsen or alleviate the chest discomfort, although drinking water sometimes helps it pass.  She has a history of coronary artery disease and underwent coronary artery bypass grafting (CABG). She is currently on crestor 10 mg, and Zetia 10 mg daily for hyperlipidemia, with her most recent LDL cholesterol level at 45. She continues to take aspirin as part of her regimen. She reports no issues with her current medications. Her most recent EKG was unremarkable.  She also has a history of polycythemia vera, which has been stable for the past six months without the need for phlebotomy. She notes that this condition sometimes causes fatigue and other symptoms she has not experienced before. Her blood pressure is normal, and she reports no bruising or bleeding.  She  mentions that she is an anxious person by nature, and anxiety might contribute to her symptoms. She experiences anxiety in various situations, such as being in traffic.          Problem List CAD -3v CAD -CABG 06/04/2021 -LIMA-LAD, SVG-OM, SVG-PDA, SVG-Diag 2. HLD -Intolerant of high intensity statin -Pruritus on repatha/praluent -T chol 100, HDL 34, LDL 45, TG 107 3. Mediterranean Fever 4. Iron deficiency anemia  5. Polycythemia Vera  -JAK2+    ROS: All other ROS reviewed and negative. Pertinent positives noted in the HPI.     Studies Reviewed: [name]        TTE 05/08/2021  1. Left ventricular ejection fraction, by estimation, is 55 to 60%. The  left ventricle has normal function. The left ventricle demonstrates  regional wall motion abnormalities (see scoring diagram/findings for  description). Left ventricular diastolic  parameters are consistent with Grade I diastolic dysfunction (impaired  relaxation). The average left ventricular global longitudinal strain is  -12.6 %. The global longitudinal strain is abnormal.   2. Right ventricular systolic function is normal. The right ventricular  size is normal.   3. The mitral valve is normal in structure. No evidence of mitral valve  regurgitation. No evidence of mitral stenosis.   4. The aortic valve is tricuspid. Aortic valve regurgitation is not  visualized. No aortic stenosis is present.   5. The inferior vena cava is normal in size with greater than 50%  respiratory variability, suggesting right atrial pressure of 3 mmHg.  Physical Exam:   VS:  BP 104/62 (BP Location: Left Arm, Patient Position: Sitting, Cuff Size: Normal)   Pulse 81   Ht 5\' 3"  (1.6 m)   Wt 102 lb 3.2 oz (46.4 kg)   LMP  (LMP Unknown)   SpO2 98%   BMI 18.10 kg/m    Wt Readings from Last 3 Encounters:  11/11/23 102 lb 3.2 oz (46.4 kg)  10/23/23 99 lb (44.9 kg)  10/13/23 98 lb (44.5 kg)    GEN: Well nourished, well developed in no acute distress NECK:  No JVD; No carotid bruits CARDIAC: RRR, no murmurs, rubs, gallops RESPIRATORY:  Clear to auscultation without rales, wheezing or rhonchi  ABDOMEN: Soft, non-tender, non-distended EXTREMITIES:  No edema; No deformity  ASSESSMENT AND PLAN: .   Assessment and Plan    Chest Pain CAD s/p CABG Intermittent chest pain not exertion-related, likely non-cardiac. Differential includes musculoskeletal pain and anxiety. EKG unremarkable, BP and cholesterol controlled. Cardiac stress test planned to exclude cardiac causes. - Complete cardiac PET stress test next week. - Continue aspirin therapy. - Continue rosuvastatin 10 mg daily. - Continue Zetia 10 mg daily.  Hyperlipidemia LDL cholesterol controlled at 45 mg/dL. Current treatment with rosuvastatin and Zetia effective without side effects.  - Refill rosuvastatin for one year. - Refill Zetia for one year.              Follow-up: Return in about 1 year (around 11/10/2024).   Signed, Lenna Gilford. Flora Lipps, MD, [facility]  [facility]  [address], [address] [place], [place] [address] [phone]  7:12 PM

## 2023-11-09 ENCOUNTER — Ambulatory Visit: Payer: Medicare Other | Admitting: Cardiovascular Disease

## 2023-11-11 ENCOUNTER — Ambulatory Visit: Payer: Medicare Other | Attending: Cardiovascular Disease | Admitting: Cardiovascular Disease

## 2023-11-11 ENCOUNTER — Encounter: Payer: Self-pay | Admitting: Cardiovascular Disease

## 2023-11-11 VITALS — BP 104/62 | HR 81 | Ht 63.0 in | Wt 102.2 lb

## 2023-11-11 DIAGNOSIS — I2510 Atherosclerotic heart disease of native coronary artery without angina pectoris: Secondary | ICD-10-CM | POA: Diagnosis not present

## 2023-11-11 DIAGNOSIS — R072 Precordial pain: Secondary | ICD-10-CM

## 2023-11-11 DIAGNOSIS — E782 Mixed hyperlipidemia: Secondary | ICD-10-CM

## 2023-11-11 MED ORDER — ROSUVASTATIN CALCIUM 10 MG PO TABS: 10.0000 mg | ORAL_TABLET | Freq: Every day | ORAL | 3 refills | Status: DC

## 2023-11-11 MED ORDER — EZETIMIBE 10 MG PO TABS: 10.0000 mg | ORAL_TABLET | Freq: Every day | ORAL | 4 refills | Status: AC

## 2023-11-11 NOTE — Patient Instructions (Signed)
 Medication Instructions:  Your physician recommends that you continue on your current medications as directed. Please refer to the Current Medication list given to you today.    *If you need a refill on your cardiac medications before your next appointment, please call your pharmacy*   Lab Work: NONE    If you have labs (blood work) drawn today and your tests are completely normal, you will receive your results only by: MyChart Message (if you have MyChart) OR A paper copy in the mail If you have any lab test that is abnormal or we need to change your treatment, we will call you to review the results.   Testing/Procedures: NONE    Follow-Up: At [facility], you and your health needs are our priority.  As part of our continuing mission to provide you with exceptional heart care, we have created designated Provider Care Teams.  These Care Teams include your primary Cardiologist (physician) and Advanced Practice Providers (APPs -  Physician Assistants and Nurse Practitioners) who all work together to provide you with the care you need, when you need it.  We recommend signing up for the patient portal called "MyChart".  Sign up information is provided on this After Visit Summary.  MyChart is used to connect with patients for Virtual Visits (Telemedicine).  Patients are able to view lab/test results, encounter notes, upcoming appointments, etc.  Non-urgent messages can be sent to your provider as well.   To learn more about what you can do with MyChart, go to [email or web address].    Your next appointment:   1 year(s)  The format for your next appointment:   In Person  Provider:   Edd Fabian, FNP, Micah Flesher, PA-C, Marjie Skiff, PA-C, Robet Leu, PA-C, Juanda Crumble, PA-C, Joni Reining, DNP, ANP, Azalee Course, PA-C, Bernadene Person, NP, or Reather Littler, NP      Other Instructions

## 2023-11-12 ENCOUNTER — Encounter (HOSPITAL_COMMUNITY): Payer: Self-pay

## 2023-11-15 ENCOUNTER — Encounter: Payer: Self-pay | Admitting: Obstetrics and Gynecology

## 2023-11-16 ENCOUNTER — Encounter: Payer: Self-pay | Admitting: Cardiovascular Disease

## 2023-11-16 ENCOUNTER — Other Ambulatory Visit (HOSPITAL_COMMUNITY)
Admission: RE | Admit: 2023-11-16 | Discharge: 2023-11-16 | Disposition: A | Discharge status: Home / Self Care | Source: Other Acute Inpatient Hospital | Attending: Obstetrics and Gynecology | Admitting: Obstetrics and Gynecology

## 2023-11-16 ENCOUNTER — Ambulatory Visit (INDEPENDENT_AMBULATORY_CARE_PROVIDER_SITE_OTHER)

## 2023-11-16 VITALS — BP 124/77 | HR 76

## 2023-11-16 DIAGNOSIS — R300 Dysuria: Secondary | ICD-10-CM | POA: Diagnosis present

## 2023-11-16 DIAGNOSIS — R350 Frequency of micturition: Secondary | ICD-10-CM | POA: Diagnosis not present

## 2023-11-16 LAB — POCT URINALYSIS DIPSTICK
Bilirubin, UA: NEGATIVE
Blood, UA: NEGATIVE
Glucose, UA: NEGATIVE
Ketones, UA: NEGATIVE
Leukocytes, UA: NEGATIVE
Nitrite, UA: NEGATIVE
Protein, UA: NEGATIVE
Spec Grav, UA: <=1.005 — AB (ref 1.010–1.025)
Urobilinogen, UA: 0.2 U/dL
pH, UA: 5.5 (ref 5.0–8.0)

## 2023-11-16 NOTE — Progress Notes (Signed)
 Katrina Walters is a 68 y.o. female arrived today with UTI sx.  A urine specimen was collected and POCT Urine was done. POCT Urine was Negative Urine was sent for culture

## 2023-11-16 NOTE — Telephone Encounter (Signed)
 Attempted to contact patient to find out more information and to possibly schedule them for nurse visit.

## 2023-11-16 NOTE — Patient Instructions (Signed)
Your Urine dip that was done in office was NEGATIVE. I am sending the urine off for culture and you can take AZO over the counter for your discomfort. We will contact you when the results are back between 3-5 days. If you have any questions or concerns please feel free to call us at [phone]  

## 2023-11-16 NOTE — Telephone Encounter (Signed)
Patient is scheduled to come in today at 11:00am.

## 2023-11-17 ENCOUNTER — Encounter: Payer: Self-pay | Admitting: Cardiovascular Disease

## 2023-11-17 ENCOUNTER — Ambulatory Visit (HOSPITAL_COMMUNITY)
Admission: RE | Admit: 2023-11-17 | Discharge: 2023-11-17 | Disposition: A | Discharge status: Home / Self Care | Payer: Medicare Other | Source: Ambulatory Visit | Attending: Cardiology | Admitting: Cardiology

## 2023-11-17 DIAGNOSIS — E782 Mixed hyperlipidemia: Secondary | ICD-10-CM | POA: Insufficient documentation

## 2023-11-17 DIAGNOSIS — R079 Chest pain, unspecified: Secondary | ICD-10-CM | POA: Insufficient documentation

## 2023-11-17 DIAGNOSIS — I2510 Atherosclerotic heart disease of native coronary artery without angina pectoris: Secondary | ICD-10-CM | POA: Diagnosis not present

## 2023-11-17 LAB — NM PET CT CARDIAC PERFUSION MULTI W/ABSOLUTE BLOODFLOW
LV dias vol: 62 mL (ref 46–106)
LV sys vol: 23 mL
MBFR: 2.74
Nuc Rest EF: 63 %
Nuc Stress EF: 61 %
Rest MBF: 0.96 ml/g/min
Rest Nuclear Isotope Dose: 12 mCi
ST Depression (mm): 0 mm
Stress MBF: 2.63 ml/g/min
Stress Nuclear Isotope Dose: 12 mCi

## 2023-11-17 LAB — URINE CULTURE: Culture: NO GROWTH

## 2023-11-17 MED ORDER — RUBIDIUM RB82 GENERATOR (RUBYFILL)
12.0000 | PACK | Freq: Once | INTRAVENOUS | Status: AC
  Administered 2023-11-17: 12.03 via INTRAVENOUS

## 2023-11-17 MED ORDER — CAFFEINE CITRATE BASE COMPONENT 10 MG/ML IV SOLN
INTRAVENOUS | Status: AC
  Filled 2023-11-17: qty 3

## 2023-11-17 MED ORDER — REGADENOSON 0.4 MG/5ML IV SOLN
INTRAVENOUS | Status: AC
  Filled 2023-11-17: qty 5

## 2023-11-17 MED ORDER — DEXTROSE 5 % IV SOLN
INTRAVENOUS | Status: AC
  Filled 2023-11-17: qty 50

## 2023-11-17 MED ORDER — RUBIDIUM RB82 GENERATOR (RUBYFILL)
12.0000 | PACK | Freq: Once | INTRAVENOUS | Status: AC
  Administered 2023-11-17: 12.16 via INTRAVENOUS

## 2023-11-17 MED ORDER — REGADENOSON 0.4 MG/5ML IV SOLN
0.4000 mg | Freq: Once | INTRAVENOUS | Status: AC
  Administered 2023-11-17: 0.4 mg via INTRAVENOUS

## 2023-11-17 NOTE — Progress Notes (Signed)
Tolerated Lexiscan well.

## 2023-11-18 ENCOUNTER — Telehealth: Payer: Self-pay

## 2023-11-18 NOTE — Telephone Encounter (Signed)
 Left message to call back

## 2023-11-18 NOTE — Telephone Encounter (Signed)
-----   Message from Rip Harbour sent at 11/18/2023  8:07 AM EDT ----- Please let [name] know that her stress test was normal and low risk. Good results! Continue current medications.

## 2023-11-19 NOTE — Telephone Encounter (Signed)
 Pt returning call

## 2023-11-19 NOTE — Telephone Encounter (Signed)
 Attempted to call patient, no answer left message requesting a call back.

## 2023-11-19 NOTE — Telephone Encounter (Signed)
 Pt returning call to nurse

## 2023-11-19 NOTE — Telephone Encounter (Signed)
 Pt is returning nurse call and is requesting a callback. She stated her phone keeps going straight to voicemail so you can leave the results on her mychart or her voicemail. Please advise

## 2023-11-19 NOTE — Telephone Encounter (Signed)
 Called patient. Spoke with patient and verified full name and DOB. Relayed Katlyn West's messages: Please let Ms. Fickett know that her stress test was normal and low risk. Good results! Continue current medications.  Patient verbalized understanding.   Josie LPN

## 2023-11-20 ENCOUNTER — Encounter: Payer: Self-pay | Admitting: Cardiovascular Disease

## 2023-11-21 ENCOUNTER — Other Ambulatory Visit: Payer: Self-pay | Admitting: Medical

## 2023-11-21 ENCOUNTER — Encounter: Payer: Self-pay | Admitting: Medical

## 2023-11-23 ENCOUNTER — Encounter: Payer: Self-pay | Admitting: Medical

## 2023-11-23 ENCOUNTER — Ambulatory Visit (INDEPENDENT_AMBULATORY_CARE_PROVIDER_SITE_OTHER): Admitting: Medical

## 2023-11-23 VITALS — BP 132/77 | HR 78 | Resp 18 | Ht 63.0 in | Wt 102.0 lb

## 2023-11-23 DIAGNOSIS — M5440 Lumbago with sciatica, unspecified side: Secondary | ICD-10-CM

## 2023-11-23 DIAGNOSIS — R739 Hyperglycemia, unspecified: Secondary | ICD-10-CM

## 2023-11-23 DIAGNOSIS — E876 Hypokalemia: Secondary | ICD-10-CM

## 2023-11-23 DIAGNOSIS — E785 Hyperlipidemia, unspecified: Secondary | ICD-10-CM

## 2023-11-23 DIAGNOSIS — G629 Polyneuropathy, unspecified: Secondary | ICD-10-CM

## 2023-11-23 DIAGNOSIS — E875 Hyperkalemia: Secondary | ICD-10-CM | POA: Diagnosis not present

## 2023-11-23 DIAGNOSIS — M810 Age-related osteoporosis without current pathological fracture: Secondary | ICD-10-CM

## 2023-11-23 LAB — HEMOGLOBIN A1C: Hgb A1c MFr Bld: 5.2 % (ref 4.6–6.5)

## 2023-11-23 LAB — COMPREHENSIVE METABOLIC PANEL
ALT: 20 U/L (ref 0–35)
AST: 23 U/L (ref 0–37)
Albumin: 4.3 g/dL (ref 3.5–5.2)
Alkaline Phosphatase: 81 U/L (ref 39–117)
BUN: 14 mg/dL (ref 6–23)
CO2: 29 meq/L (ref 19–32)
Calcium: 9.4 mg/dL (ref 8.4–10.5)
Chloride: 100 meq/L (ref 96–112)
Creatinine, Ser: 0.69 mg/dL (ref 0.40–1.20)
GFR: 90.22 mL/min (ref >60.00)
Glucose, Bld: 82 mg/dL (ref 70–99)
Potassium: 4.6 meq/L (ref 3.5–5.1)
Sodium: 136 meq/L (ref 135–145)
Total Bilirubin: 0.7 mg/dL (ref 0.2–1.2)
Total Protein: 7.1 g/dL (ref 6.0–8.3)

## 2023-11-23 LAB — LIPID PANEL
Cholesterol: 109 mg/dL (ref 0–200)
HDL: 36.1 mg/dL — ABNORMAL LOW (ref >39.00)
LDL Cholesterol: 50 mg/dL (ref 0–99)
NonHDL: 73.02
Total CHOL/HDL Ratio: 3
Triglycerides: 115 mg/dL (ref 0.0–149.0)
VLDL: 23 mg/dL (ref 0.0–40.0)

## 2023-11-23 MED ORDER — FAMCICLOVIR 500 MG PO TABS: 500.0000 mg | ORAL_TABLET | Freq: Three times a day (TID) | ORAL | 0 refills | Status: DC

## 2023-11-23 MED ORDER — LOKELMA 5 G PO PACK: PACK | ORAL | 0 refills | Status: DC

## 2023-11-23 MED ORDER — DICLOFENAC SODIUM 75 MG PO TBEC: 75.0000 mg | DELAYED_RELEASE_TABLET | Freq: Two times a day (BID) | ORAL | 0 refills | Status: DC

## 2023-11-23 NOTE — Progress Notes (Signed)
 Subjective:    Patient ID: Katrina Walters, female    DOB: 07-13-1956, 68 y.o.   MRN: [id number]  HPI  Discussed the use of AI scribe software for clinical note transcription with the patient, who gave verbal consent to proceed.  History of Present Illness   Katrina Walters is a 68 year old female with prediabetes who presents with numbness and tingling in her toes.  She has been experiencing numbness and tingling in her toes for the past couple of months. The sensation is constant, occurring throughout the day and night, and affects both feet equally. Previous evaluations included a blood test that showed no blockages and a normal ankle-brachial index test. No muscle cramping in her calves and no shooting pain to her legs, except for occasional slight pain on the left side. Her B12 and folate levels were normal in February, but she has not had a B1 level checked recently.  She experiences lower back pain, which is occasionally exacerbated and managed with diclofenac, having taken approximately 20 tablets over the last five months. She has a history of scoliosis and degenerative changes in her lumbar spine, previously evaluated by a back specialist. The pain sometimes radiates slightly to her left leg, which she notes is weaker than her right leg.  She occasionally experiences cold sores, with the most recent outbreak occurring four to five days ago. The sore is described as painful and located on the edge of her lip. She has not previously used antiviral medications for this condition.  She is actively managing her health by monitoring her diet, including consuming sugar-free desserts, and engaging in physical activity such as walking on a treadmill for 20 to 23 minutes. She is currently taking potassium twice a week due to a previous blood test indicating a high potassium level of 5.6. She is also concerned about her cholesterol levels and is fasting for a blood test today. pt on lokelma.             Review of Systems  Constitutional:  Negative for chills, fatigue and fever.  HENT:  Negative for congestion, drooling and ear pain.   Respiratory:  Negative for cough, chest tightness, shortness of breath and wheezing.   Cardiovascular:  Negative for chest pain and palpitations.  Gastrointestinal:  Negative for abdominal pain and blood in stool.  Genitourinary:  Negative for dyspareunia, dysuria, flank pain and urgency.  Musculoskeletal:  Positive for back pain. Negative for joint swelling.  Skin:  Negative for rash.  Neurological:  Negative for dizziness, tremors, seizures and light-headedness.  Hematological:  Negative for adenopathy. Does not bruise/bleed easily.  Psychiatric/Behavioral:  Negative for behavioral problems, decreased concentration and sleep disturbance. The patient is not nervous/anxious.     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 49 years in [place], managed on colchicine   Osteoporosis 09/2016   Peripheral arterial disease (HCC)    Polycythemia vera (HCC)    PONV (postoperative nausea and vomiting)      Social History   Socioeconomic History   Marital status: Married    Spouse name: Not on file   Number of children:  1   Years of education: Not on file   Highest education level: 12th grade  Occupational History   Occupation: unemployed at the time  Tobacco Use   Smoking status: Never   Smokeless tobacco: Never  Vaping Use   Vaping status: Never Used  Substance and Sexual Activity   Alcohol use: Yes    Alcohol/week: 1.0 standard drink of alcohol    Types: 1 Glasses of wine per week    Comment: occassionally   Drug use: No    Comment: CBD gummies   Sexual activity: Yes     Birth control/protection: Post-menopausal  Other Topics Concern   Not on file  Social History Narrative   Not on file   Social Drivers of Health   Financial Resource Strain: Medium Risk (09/08/2023)   Overall Financial Resource Strain (CARDIA)    Difficulty of Paying Living Expenses: Somewhat hard  Food Insecurity: Food Insecurity Present (09/08/2023)   Hunger Vital Sign    Worried About Running Out of Food in the Last Year: Sometimes true    Ran Out of Food in the Last Year: Never true  Transportation Needs: No Transportation Needs (09/08/2023)   PRAPARE - Administrator, Civil Service (Medical): No    Lack of Transportation (Non-Medical): No  Physical Activity: Insufficiently Active (09/08/2023)   Exercise Vital Sign    Days of Exercise per Week: 3 days    Minutes of Exercise per Session: 20 min  Stress: Stress Concern Present (09/08/2023)   [employer] of Occupational Health - Occupational Stress Questionnaire    Feeling of Stress : To some extent  Social Connections: Moderately Integrated (09/08/2023)   Social Connection and Isolation Panel [NHANES]    Frequency of Communication with Friends and Family: More than three times a week    Frequency of Social Gatherings with Friends and Family: Once a week    Attends Religious Services: Never    Database administrator or Organizations: Yes    Attends Banker Meetings: 1 to 4 times per year    Marital Status: Married  Recent Concern: Social Connections - Moderately Isolated (06/15/2023)   Social Connection and Isolation Panel [NHANES]    Frequency of Communication with Friends and Family: More than three times a week    Frequency of Social Gatherings with Friends and Family: Once a week    Attends Religious Services: Never    Database administrator or Organizations: No    Attends Banker Meetings: Never    Marital Status: Married  Catering manager Violence: Not At Risk (02/23/2023)    Humiliation, Afraid, Rape, and Kick questionnaire    Fear of Current or Ex-Partner: No    Emotionally Abused: No    Physically Abused: No    Sexually Abused: No    Past Surgical History:  Procedure Laterality Date   ANAL RECTAL MANOMETRY N/A 11/12/2020   Procedure: ANO RECTAL MANOMETRY;  Surgeon: Shellia Cleverly, DO;  Location: [facility]COPY;  Service: Gastroenterology;  Laterality: N/A;   BREAST REDUCTION SURGERY  2006   CARDIAC CATHETERIZATION N/A 07/17/2016   Procedure: Left Heart Cath and Coronary Angiography;  Surgeon: Kathleene Hazel, MD;  Location: [facility] INVASIVE CV LAB;  Service: Cardiovascular;  Laterality: N/A;   CATARACT EXTRACTION W/ INTRAOCULAR LENS IMPLANT Bilateral    COLONOSCOPY  2015   CORONARY ARTERY BYPASS GRAFT N/A 06/04/2021   Procedure: CORONARY ARTERY BYPASS GRAFTING (CABG), ON PUMP, TIMES FOUR, USING LEFT  INTERNAL MAMMARY ARTERY, LEFT RADIAL ARTERY (HARVESTED OPEN), AND RIGHT GREATER SAPHENOUS VEIN (HARVESTED ENDOSCOPICALLY);  Surgeon: Corliss Skains, MD;  Location: [facility] OR;  Service: Open Heart Surgery;  Laterality: N/A;   EYE SURGERY     LEFT HEART CATH AND CORONARY ANGIOGRAPHY N/A 04/22/2021   Procedure: LEFT HEART CATH AND CORONARY ANGIOGRAPHY;  Surgeon: Lyn Records, MD;  Location: [facility] INVASIVE CV LAB;  Service: Cardiovascular;  Laterality: N/A;   POLYPECTOMY  2015   RADIAL ARTERY HARVEST Left 06/04/2021   Procedure: RADIAL ARTERY HARVEST;  Surgeon: Corliss Skains, MD;  Location: [facility] OR;  Service: Open Heart Surgery;  Laterality: Left;   TEE WITHOUT CARDIOVERSION N/A 06/04/2021   Procedure: TRANSESOPHAGEAL ECHOCARDIOGRAM (TEE);  Surgeon: Corliss Skains, MD;  Location: [facility] OR;  Service: Open Heart Surgery;  Laterality: N/A;   TONSILLECTOMY     VAGINAL HYSTERECTOMY  08/2019   w/ apical suspension, A&P repair 08/2019 at [facility]    Family History  Problem Relation Age of Onset   Heart disease Mother        died at 101 of heart attack    Hypertension Mother    Heart attack Mother    Heart disease Father        had heart attack at age 40   Heart attack Father    Rectal cancer Father 41   Hearing loss Father    Cancer Paternal Aunt    Stomach cancer Paternal Grandmother    Colon cancer Neg Hx    Colon polyps Neg Hx    Esophageal cancer Neg Hx     Allergies  Allergen Reactions   Amoxicillin     Had fever with flu like symptoms. Has patient had a PCN reaction causing immediate rash, facial/tongue/throat swelling, SOB or lightheadedness with hypotension: no Has patient had a PCN reaction causing severe rash involving mucus membranes or skin necrosis: no Has patient had a PCN reaction that required hospitalization no Has patient had a PCN reaction occurring within the last 10 years: yes If all of the above answers are "NO", then may proceed with Cephalosporin use.    Current Outpatient Medications on File Prior to Visit  Medication Sig Dispense Refill   aspirin 81 MG chewable tablet Chew by mouth daily.     Bismuth 262 MG CHEW Chew 524 mg by mouth in the morning, at noon, in the evening, and at bedtime. 112 tablet 0   colchicine 0.6 MG tablet Take 1 tablet (0.6 mg total) by mouth 2 (two) times daily. 180 tablet 3   Cranberry-Vitamin C 250-60 MG CAPS Take 2 tablets by mouth daily.     D-MANNOSE PO Take 2 tablets by mouth daily.     denosumab (PROLIA) 60 MG/ML SOSY injection INJECT 1 SYRINGE UNDER THE SKIN ONCE EVERY 6 MONTHS 1 mL 1   diphenhydrAMINE (BENADRYL) 25 MG tablet Take 25 mg by mouth at bedtime.     ezetimibe (ZETIA) 10 MG tablet Take 1 tablet (10 mg total) by mouth daily. Please keep scheduled appointment for additional refills. 90 tablet 4   famotidine (PEPCID) 20 MG tablet Take 1 tablet (20 mg total) by mouth daily. 90 tablet 1   FIBER PO Take 2 tablets by mouth daily.     levocetirizine (XYZAL) 5 MG tablet Take 1 tablet (5 mg total) by mouth every evening. 90 tablet 1   linaclotide (LINZESS) 290 MCG  CAPS capsule Take 1 capsule (290 mcg total) by mouth daily before  breakfast. 90 capsule 2   mupirocin ointment (BACTROBAN) 2 % Apply 1 Application topically 2 (two) times daily. 22 g 0   nitazoxanide (ALINIA) 500 MG tablet Take 1 tablet (500 mg total) by mouth 2 (two) times daily with a meal. 20 tablet 0   NON FORMULARY Take 0.5 tablets by mouth at bedtime. CBD oil - for pain Gummies     pantoprazole (PROTONIX) 40 MG tablet Take 40 mg by mouth daily.     Polyethyl Glycol-Propyl Glycol (SYSTANE OP) Place 1 drop into both eyes in the morning and at bedtime.     PREMARIN vaginal cream Place 0.25 Applicatorfuls vaginally See admin instructions. Insert 0.5 grams vaginally every other night 30 g 12   rosuvastatin (CRESTOR) 10 MG tablet Take 1 tablet (10 mg total) by mouth daily. 90 tablet 3   sodium zirconium cyclosilicate (LOKELMA) 5 g packet 5 gram pack twice a week(Tuesday and Thursday) 8 packet 0   triamcinolone cream (KENALOG) 0.1 % Apply 1 Application topically 2 (two) times daily. 30 g 1   Wheat Dextrin (BENEFIBER DRINK MIX PO) Take by mouth daily at 6 (six) AM.     Current Facility-Administered Medications on File Prior to Visit  Medication Dose Route Frequency Provider Last Rate Last Admin   [START ON 02/18/2024] denosumab (PROLIA) injection 60 mg  60 mg Subcutaneous Once Shia Delaine, PA-C        BP 132/77   Pulse 78   Resp 18   Ht 5\' 3"  (1.6 m)   Wt 102 lb (46.3 kg)   LMP  (LMP Unknown)   SpO2 99%   BMI 18.07 kg/m        Objective:   Physical Exam  General Mental Status- Alert. General Appearance- Not in acute distress.   Skin General: Color- Normal Color. Moisture- Normal Moisture.  Neck Carotid Arteries- Normal color. Moisture- Normal Moisture. No carotid bruits. No JVD.  Chest and Lung Exam Auscultation: Breath Sounds:-Normal.  Cardiovascular Auscultation:Rythm- Regular. Murmurs & Other Heart Sounds:Auscultation of the heart reveals- No  Murmurs.     Neurologic Cranial Nerve exam:- CN III-XII intac grossly       Assessment & Plan:   Patient Instructions  Peripheral Neuropathy(keep in mind numbness may be from your back) Chronic numbness and tingling in toes. Differential includes diabetic neuropathy, vitamin deficiencies, or idiopathic causes. Concern for diabetes due to prediabetes. Medications not necessary unless symptoms worsen. - Order hemoglobin A1c. - Order comprehensive metabolic panel including potassium. - Order vitamin B1 (thiamine) level. -might consider lumbar imaging studies in the future.  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 Managed with diclofenac as needed. - Prescribe diclofenac 20 tablets for as-needed use.  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 Current episode started 4-5 days ago. Famvir prescribed despite late treatment window for potential benefit. - Prescribe Famvir (famciclovir) three times a day for seven days.  Follow up date to be determined after lab review.    Esperanza Richters, PA-C

## 2023-11-23 NOTE — Patient Instructions (Signed)
 Peripheral Neuropathy(keep in mind numbness may be from your back) Chronic numbness and tingling in toes. Differential includes diabetic neuropathy, vitamin deficiencies, or idiopathic causes. Concern for diabetes due to prediabetes. Medications not necessary unless symptoms worsen. - Order hemoglobin A1c. - Order comprehensive metabolic panel including potassium. - Order vitamin B1 (thiamine) level. -might consider lumbar imaging studies in the future.  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 Managed with diclofenac as needed. - Prescribe diclofenac 20 tablets for as-needed use.  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 Current episode started 4-5 days ago. Famvir prescribed despite late treatment window for potential benefit. - Prescribe Famvir (famciclovir) three times a day for seven days.  Follow up date to be determined after lab review.

## 2023-11-26 DIAGNOSIS — H353132 Nonexudative age-related macular degeneration, bilateral, intermediate dry stage: Secondary | ICD-10-CM | POA: Diagnosis not present

## 2023-11-26 LAB — VITAMIN B1: Vitamin B1 (Thiamine): 10 nmol/L (ref 8–30)

## 2023-12-17 ENCOUNTER — Other Ambulatory Visit (HOSPITAL_BASED_OUTPATIENT_CLINIC_OR_DEPARTMENT_OTHER): Payer: Self-pay | Admitting: Medical

## 2023-12-17 ENCOUNTER — Ambulatory Visit (INDEPENDENT_AMBULATORY_CARE_PROVIDER_SITE_OTHER): Payer: Medicare Other | Admitting: Family Medicine

## 2023-12-17 VITALS — BP 129/72 | HR 72 | Ht 63.0 in | Wt 101.0 lb

## 2023-12-17 DIAGNOSIS — Z87440 Personal history of urinary (tract) infections: Secondary | ICD-10-CM | POA: Diagnosis not present

## 2023-12-17 DIAGNOSIS — N952 Postmenopausal atrophic vaginitis: Secondary | ICD-10-CM

## 2023-12-17 DIAGNOSIS — Z1231 Encounter for screening mammogram for malignant neoplasm of breast: Secondary | ICD-10-CM

## 2023-12-17 MED ORDER — PREMARIN 0.625 MG/GM VA CREA: 0.5000 g | TOPICAL_CREAM | VAGINAL | 12 refills | Status: AC

## 2023-12-17 MED ORDER — PROGESTERONE MICRONIZED 100 MG PO CAPS: 100.0000 mg | ORAL_CAPSULE | Freq: Every day | ORAL | 3 refills | Status: DC

## 2023-12-17 NOTE — Progress Notes (Signed)
   Subjective:    Patient ID: Katrina Walters, female    DOB: [birth year], 68 y.o.   MRN: [id number]  HPI  Patient doing well.  She has been taking d-mannose, postcoital antibiotics, Premarin.  She did have an appointment with Dr. Florian Buff since that time, she has not had infection.  She is having some difficulty sleeping.  She wonders if starting progesterone would be helpful.  She is able to fall asleep without difficulty, but has a hard time staying asleep.  Review of Systems     Objective:   Physical Exam Vitals reviewed.  Constitutional:      Appearance: Normal appearance.  Skin:    Capillary Refill: Capillary refill takes less than 2 seconds.  Neurological:     General: No focal deficit present.     Mental Status: She is alert.  Psychiatric:        Mood and Affect: Mood normal.        Behavior: Behavior normal.        Thought Content: Thought content normal.        Judgment: Judgment normal.       Assessment & Plan:  1. Vaginal atrophy (Primary) Continue premarin  2. History of recurrent UTIs Continue D-mannose, Premarin, postcoital abx.  3. Difficulty sleeping. Some postmenopausal women do well with low dose progesterone to help with sleep. Will trial. Discussed potential side effects - breast sensitivity, breast cysts, etc.

## 2023-12-29 ENCOUNTER — Other Ambulatory Visit (HOSPITAL_COMMUNITY): Payer: Self-pay

## 2023-12-29 ENCOUNTER — Encounter: Payer: Self-pay | Admitting: Medical Oncology

## 2023-12-29 ENCOUNTER — Other Ambulatory Visit: Payer: Self-pay

## 2023-12-29 ENCOUNTER — Inpatient Hospital Stay (HOSPITAL_BASED_OUTPATIENT_CLINIC_OR_DEPARTMENT_OTHER): Payer: Medicare Other | Admitting: Medical Oncology

## 2023-12-29 ENCOUNTER — Telehealth: Payer: Self-pay | Admitting: Pharmacy Technician

## 2023-12-29 ENCOUNTER — Telehealth: Payer: Self-pay | Admitting: Pharmacist

## 2023-12-29 ENCOUNTER — Inpatient Hospital Stay: Payer: Medicare Other

## 2023-12-29 ENCOUNTER — Inpatient Hospital Stay: Payer: Medicare Other | Attending: Hematology & Oncology

## 2023-12-29 VITALS — BP 126/70 | HR 77 | Temp 99.0°F | Resp 16 | Wt 101.2 lb

## 2023-12-29 VITALS — BP 117/60 | HR 70

## 2023-12-29 DIAGNOSIS — D45 Polycythemia vera: Secondary | ICD-10-CM

## 2023-12-29 DIAGNOSIS — D509 Iron deficiency anemia, unspecified: Secondary | ICD-10-CM | POA: Insufficient documentation

## 2023-12-29 DIAGNOSIS — D75839 Thrombocytosis, unspecified: Secondary | ICD-10-CM

## 2023-12-29 DIAGNOSIS — D649 Anemia, unspecified: Secondary | ICD-10-CM

## 2023-12-29 DIAGNOSIS — D751 Secondary polycythemia: Secondary | ICD-10-CM

## 2023-12-29 LAB — CMP (CANCER CENTER ONLY)
ALT: 25 U/L (ref 0–44)
AST: 40 U/L (ref 15–41)
Albumin: 4.4 g/dL (ref 3.5–5.0)
Alkaline Phosphatase: 77 U/L (ref 38–126)
Anion gap: 8 (ref 5–15)
BUN: 9 mg/dL (ref 8–23)
CO2: 27 mmol/L (ref 22–32)
Calcium: 10.1 mg/dL (ref 8.9–10.3)
Chloride: 104 mmol/L (ref 98–111)
Creatinine: 0.8 mg/dL (ref 0.44–1.00)
GFR, Estimated: >60 mL/min (ref >60)
Glucose, Bld: 112 mg/dL — ABNORMAL HIGH (ref 70–99)
Potassium: 5 mmol/L (ref 3.5–5.1)
Sodium: 139 mmol/L (ref 135–145)
Total Bilirubin: 0.5 mg/dL (ref 0.0–1.2)
Total Protein: 7.7 g/dL (ref 6.5–8.1)

## 2023-12-29 LAB — CBC WITH DIFFERENTIAL (CANCER CENTER ONLY)
Abs Immature Granulocytes: 0.08 10*3/uL — ABNORMAL HIGH (ref 0.00–0.07)
Basophils Absolute: 0.1 10*3/uL (ref 0.0–0.1)
Basophils Relative: 1 %
Eosinophils Absolute: 0.4 10*3/uL (ref 0.0–0.5)
Eosinophils Relative: 3 %
HCT: 46.8 % — ABNORMAL HIGH (ref 36.0–46.0)
Hemoglobin: 13.4 g/dL (ref 12.0–15.0)
Immature Granulocytes: 1 %
Lymphocytes Relative: 16 %
Lymphs Abs: 2.2 10*3/uL (ref 0.7–4.0)
MCH: 19.1 pg — ABNORMAL LOW (ref 26.0–34.0)
MCHC: 28.6 g/dL — ABNORMAL LOW (ref 30.0–36.0)
MCV: 66.7 fL — ABNORMAL LOW (ref 80.0–100.0)
Monocytes Absolute: 0.7 10*3/uL (ref 0.1–1.0)
Monocytes Relative: 5 %
Neutro Abs: 10.4 10*3/uL — ABNORMAL HIGH (ref 1.7–7.7)
Neutrophils Relative %: 74 %
Platelet Count: 605 10*3/uL — ABNORMAL HIGH (ref 150–400)
RBC: 7.02 MIL/uL — ABNORMAL HIGH (ref 3.87–5.11)
RDW: 23.1 % — ABNORMAL HIGH (ref 11.5–15.5)
WBC Count: 13.9 10*3/uL — ABNORMAL HIGH (ref 4.0–10.5)
nRBC: 0 % (ref 0.0–0.2)

## 2023-12-29 LAB — IRON AND IRON BINDING CAPACITY (CC-WL,HP ONLY)
Iron: 22 ug/dL — ABNORMAL LOW (ref 28–170)
Saturation Ratios: 5 % — ABNORMAL LOW (ref 10.4–31.8)
TIBC: 420 ug/dL (ref 250–450)
UIBC: 398 ug/dL (ref 148–442)

## 2023-12-29 LAB — FERRITIN: Ferritin: 14 ng/mL (ref 11–307)

## 2023-12-29 LAB — LACTATE DEHYDROGENASE: LDH: 165 U/L (ref 98–192)

## 2023-12-29 MED ORDER — SODIUM CHLORIDE 0.9 % IV SOLN: INTRAVENOUS | Status: DC

## 2023-12-29 MED ORDER — RUXOLITINIB PHOSPHATE 10 MG PO TABS: 10.0000 mg | ORAL_TABLET | Freq: Two times a day (BID) | ORAL | 3 refills | Status: DC

## 2023-12-29 MED ORDER — RUXOLITINIB PHOSPHATE 5 MG PO TABS
5.0000 mg | ORAL_TABLET | Freq: Two times a day (BID) | ORAL | 3 refills | Status: DC
  Filled 2023-12-31: qty 60, 30d supply, fill #0
  Filled 2024-01-25: qty 60, 30d supply, fill #1

## 2023-12-29 NOTE — Progress Notes (Signed)
 Katrina Walters presents today for phlebotomy per MD orders. Phlebotomy procedure started at 1135 and ended at 1150. 535 cc removed via 18 G needle at [facility] site. Patient tolerated procedure well. IV replacement fluids given post phlebotomy. IV needle removed intact.

## 2023-12-29 NOTE — Telephone Encounter (Signed)
 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  CBC w/ Diff and CMP from 12/29/23 assessed, patient with Scr of 0.8 mg/dL (CrCl ~ 09WJ/XBJ) - discussed with Sunnie England, PA-C, and we will dose adjust Jakafi  to 5 mg BID for renal dysfunction. Prescription dose and frequency assessed for appropriateness.  Current medication list in Epic reviewed, no relevant/significant DDIs with Jakafi  identified.  Evaluated chart and no patient barriers to medication adherence noted.   Prescription has been e-scribed to the [facility] for benefits analysis and approval.  Oral Oncology Clinic will continue to follow for insurance authorization, copayment issues, initial counseling and start date.  Jude Norton, PharmD, BCPS, BCOP Hematology/Oncology Clinical Pharmacist [facility] and [facility] Oral Chemotherapy Navigation Clinics [phone] 12/29/2023 3:01 PM

## 2023-12-29 NOTE — Telephone Encounter (Signed)
 Oral Oncology Patient Advocate Encounter  After completing a benefits investigation, prior authorization for Jakafi  is not required at this time through [facility].  Patient's copay is $12.15.     [name], CPhT Oncology Pharmacy Patient Advocate [facility] Direct Number: [phone] Fax: [phone]

## 2023-12-29 NOTE — Progress Notes (Addendum)
 Hematology and Oncology Follow Up Visit  Yunuen Mordan [id number] 1956-05-28 68 y.o. 12/29/2023   Principle Diagnosis:  Polycythemia Vera -- JAK2 (+)   Current Therapy:        Phlebotomy to maintain HCT below 45% Aspirin  81 mg PO daily Jakafi - 10 mg BID- started on 12/29/2023               Interim History:  Ms. Pixley is here today with her husband for follow-up.    Since our last visit she has been ok. Night sweats are still present. Generalized itching is maybe slightly worse.   She continues to struggle with her H. Pylori infection. She was unable to be treated for this due to medication intolerance. She is being referred to [facility] by her PCP for further management. They are still waiting on her phone call from [facility]. She reports occasional shortness of breath and chest pain- she is being followed closely by cardiology for this and has an upcoming stress test in about 10 days.   Denies nausea and vomiting.  Weight is overall stable. No infections or fevers.   There has been no bleeding to her knowledge: denies epistaxis, gingivitis, hemoptysis, hematemesis, hematuria, melena, excessive bruising, blood donation.   Weight has trended down- suspected to be due to diet changes. She reports no new bone pain or unintentional weight loss.   Wt Readings from Last 3 Encounters:  12/29/23 101 lb 3.2 oz (45.9 kg)  12/17/23 101 lb (45.8 kg)  11/23/23 102 lb (46.3 kg)   ECOG Performance Status: 1 - Symptomatic but completely ambulatory  Medications:  Allergies as of 12/29/2023       Reactions   Amoxicillin     Had fever with flu like symptoms. Has patient had a PCN reaction causing immediate rash, facial/tongue/throat swelling, SOB or lightheadedness with hypotension: no Has patient had a PCN reaction causing severe rash involving mucus membranes or skin necrosis: no Has patient had a PCN reaction that required hospitalization no Has patient had a PCN reaction occurring within the last 10  years: yes If all of the above answers are "NO", then may proceed with Cephalosporin use.        Medication List        Accurate as of December 29, 2023  2:35 PM. If you have any questions, ask your nurse or doctor.          aspirin  81 MG chewable tablet Chew by mouth daily.   BENEFIBER DRINK MIX PO Take by mouth daily at 6 (six) AM.   Bismuth  262 MG Chew Chew 524 mg by mouth in the morning, at noon, in the evening, and at bedtime.   colchicine  0.6 MG tablet Take 1 tablet (0.6 mg total) by mouth 2 (two) times daily.   Cranberry-Vitamin C 250-60 MG Caps Take 2 tablets by mouth daily.   D-MANNOSE PO Take 2 tablets by mouth daily.   diclofenac  75 MG EC tablet Commonly known as: VOLTAREN  Take 1 tablet (75 mg total) by mouth 2 (two) times daily.   diphenhydrAMINE  25 MG tablet Commonly known as: BENADRYL  Take 25 mg by mouth at bedtime.   ezetimibe  10 MG tablet Commonly known as: ZETIA  Take 1 tablet (10 mg total) by mouth daily. Please keep scheduled appointment for additional refills.   famciclovir  500 MG tablet Commonly known as: FAMVIR  Take 1 tablet (500 mg total) by mouth 3 (three) times daily.   famotidine  20 MG tablet Commonly known as: PEPCID  Take 1  tablet (20 mg total) by mouth daily.   FIBER PO Take 2 tablets by mouth daily.   levocetirizine 5 MG tablet Commonly known as: XYZAL  Take 1 tablet (5 mg total) by mouth every evening.   linaclotide  290 MCG Caps capsule Commonly known as: LINZESS  Take 1 capsule (290 mcg total) by mouth daily before breakfast.   Lokelma  5 g packet Generic drug: sodium zirconium cyclosilicate  5 gram pack twice a week(Tuesday and Thursday)   mupirocin  ointment 2 % Commonly known as: BACTROBAN  Apply 1 Application topically 2 (two) times daily.   nitazoxanide  500 MG tablet Commonly known as: ALINIA  Take 1 tablet (500 mg total) by mouth 2 (two) times daily with a meal.   NON FORMULARY Take 0.5 tablets by mouth at  bedtime. CBD oil - for pain Gummies   pantoprazole  40 MG tablet Commonly known as: PROTONIX  Take 40 mg by mouth daily.   Premarin  vaginal cream Generic drug: conjugated estrogens  Place 0.25 Applicatorfuls vaginally See admin instructions. Insert 0.5 grams vaginally every other night   progesterone  100 MG capsule Commonly known as: Prometrium  Take 1 capsule (100 mg total) by mouth daily.   Prolia  60 MG/ML Sosy injection Generic drug: denosumab  INJECT 1 SYRINGE UNDER THE SKIN ONCE EVERY 6 MONTHS   rosuvastatin  10 MG tablet Commonly known as: CRESTOR  Take 1 tablet (10 mg total) by mouth daily.   ruxolitinib  phosphate 10 MG tablet Commonly known as: JAKAFI  Take 1 tablet (10 mg total) by mouth 2 (two) times daily. Started by: Candia Chain OP Place 1 drop into both eyes in the morning and at bedtime.   triamcinolone  cream 0.1 % Commonly known as: KENALOG  Apply 1 Application topically 2 (two) times daily.        Allergies:  Allergies  Allergen Reactions   Amoxicillin      Had fever with flu like symptoms. Has patient had a PCN reaction causing immediate rash, facial/tongue/throat swelling, SOB or lightheadedness with hypotension: no Has patient had a PCN reaction causing severe rash involving mucus membranes or skin necrosis: no Has patient had a PCN reaction that required hospitalization no Has patient had a PCN reaction occurring within the last 10 years: yes If all of the above answers are "NO", then may proceed with Cephalosporin use.    Past Medical History, Surgical history, Social history, and Family History were reviewed and updated.  Review of Systems: All other 10 point review of systems is negative.   Physical Exam:  weight is 101 lb 3.2 oz (45.9 kg). Her temperature is 99 F (37.2 C). Her blood pressure is 126/70 and her pulse is 77. Her respiration is 16 and oxygen saturation is 100%.   Wt Readings from Last 3 Encounters:  12/29/23 101  lb 3.2 oz (45.9 kg)  12/17/23 101 lb (45.8 kg)  11/23/23 102 lb (46.3 kg)   Constitutional: Thin  Ocular: Sclerae unicteric, pupils equal, round and reactive to light Ear-nose-throat: Oropharynx clear, dentition fair Lymphatic: No cervical or supraclavicular adenopathy Lungs no rales or rhonchi, good excursion bilaterally Heart regular rate and rhythm, no murmur appreciated.  Abd soft, nontender, positive bowel sounds MSK no focal spinal tenderness, no joint edema Neuro: non-focal, well-oriented, appropriate affect   Lab Results  Component Value Date   WBC 13.9 (H) 12/29/2023   HGB 13.4 12/29/2023   HCT 46.8 (H) 12/29/2023   MCV 66.7 (L) 12/29/2023   PLT 605 (H) 12/29/2023   Lab Results  Component Value Date  FERRITIN 6 (L) 10/27/2023   IRON  23 (L) 10/27/2023   TIBC 434 10/27/2023   UIBC 411 10/27/2023   IRONPCTSAT 5 (L) 10/27/2023   Lab Results  Component Value Date   RETICCTPCT 1.7 10/27/2023   RBC 7.02 (H) 12/29/2023   No results found for: "KPAFRELGTCHN", "LAMBDASER", "KAPLAMBRATIO" No results found for: "IGGSERUM", "IGA", "IGMSERUM" No results found for: "TOTALPROTELP", "ALBUMINELP", "A1GS", "A2GS", "BETS", "BETA2SER", "GAMS", "MSPIKE", "SPEI"   Chemistry      Component Value Date/Time   NA 139 12/29/2023 1033   NA 139 04/15/2021 1027   K 5.0 12/29/2023 1033   CL 104 12/29/2023 1033   CO2 27 12/29/2023 1033   BUN 9 12/29/2023 1033   BUN 14 04/15/2021 1027   CREATININE 0.80 12/29/2023 1033   CREATININE 0.84 07/14/2016 0914      Component Value Date/Time   CALCIUM  10.1 12/29/2023 1033   ALKPHOS 77 12/29/2023 1033   AST 40 12/29/2023 1033   ALT 25 12/29/2023 1033   BILITOT 0.5 12/29/2023 1033      Encounter Diagnoses  Name Primary?   Polycythemia vera (HCC) Yes   Erythrocytosis    Iron  deficiency anemia, unspecified iron  deficiency anemia type    Thrombocytosis      Impression and Plan: Ms. Penner is a very pleasant 68 yo caucasian female with  polycythemia vera, JAK 2 positive. She also has erythrocytosis and IDA. Plan is to have her come in every 4 weeks for lab and possible phlebotomy if hematocrit is above 45%.   Her hematocrit is 46.8% today. She will have a phlebotomy. She also has a chronically elevated WBC and elevated platelets. Elevated platelets suspected to be due to iron  deficiency s/p phlebotomy for her PV management. Neutrophilia likely secondary to her PV thought could be related to her current H. Pylori infection. WBC and platelets are stable.  Given her increased symptoms and CBC levels we discussed Hydrea vs Jakafi  including risks and benefits. She would like to proceed forward with this medication. Discussed with Dr. Maria Shiner and we will proceed forward with Jakafi . We will monitor her labs at the one month mark and discuss if dose adjustment is needed. After the first month we can dose adjust every 2 weeks if needed. CrCl adjustment is 5 mg BID. She is a non-smoker No history of TB or exposure No history of Hepatits  She will keep working with GI She is UTD on Hepatitis B and C screenings (08/2023) She is scheduling a TBSE She is UTD on her mammograms and Colon cancer screenings. CMP looks good today    Starting on Jakifi Phlebotomy today RTC 1 months APP, labs(CBC w/, CMP, LDH, Iron , ferritin), +- phlebotomy -    Sharla Davis, PA-C 4/22/20252:35 PM

## 2023-12-29 NOTE — Patient Instructions (Signed)

## 2023-12-30 ENCOUNTER — Encounter: Payer: Self-pay | Admitting: Medical Oncology

## 2023-12-31 ENCOUNTER — Other Ambulatory Visit: Payer: Self-pay

## 2023-12-31 ENCOUNTER — Other Ambulatory Visit: Payer: Self-pay | Admitting: Pharmacy Technician

## 2023-12-31 ENCOUNTER — Other Ambulatory Visit (HOSPITAL_COMMUNITY): Payer: Self-pay

## 2023-12-31 NOTE — Progress Notes (Signed)
 Specialty Pharmacy Initial Fill Coordination Note  Katrina Walters is a 68 y.o. female contacted today regarding refills of specialty medication(s) Ruxolitinib  Phosphate (JAKAFI ) .  Patient requested Delivery  on 01/04/24  to verified address [address] [place] [address]   Medication will be filled on 04/25.   Patient is aware of $12.15 copayment.   [name], CPhT Oncology Pharmacy Patient Advocate [facility] Direct Number: [phone] Fax: [phone]

## 2023-12-31 NOTE — Progress Notes (Signed)
 Oral Chemotherapy Pharmacist Encounter  Patient was counseled under telephone encounter from 12/29/23.  Jude Norton, PharmD, BCPS, BCOP Hematology/Oncology Clinical Pharmacist [facility] and [facility] Oral Chemotherapy Navigation Clinics [phone] 12/31/2023 10:24 AM

## 2023-12-31 NOTE — Telephone Encounter (Signed)
 Oral Chemotherapy Pharmacist Encounter  I spoke with patient for overview of: Jakafi  (ruxolitinib ) for the treatment of PV, JAK2 positive, planned duration until disease progression or unacceptable toxicity.   Counseled patient on administration, dosing, side effects, monitoring, drug-food interactions, safe handling, storage, and disposal.  Patient will take Jakafi  5mg  tablets, 1 tablet by mouth 2 times daily, with or without food.  Patient knows to avoid grapefruit and grapefruit juice.  Jakafi  start date: 01/04/24 PM  Adverse effects include but are not limited to: decreased blood counts, increased lipid profile, increased cholesterol, increased liver enzymes, dizziness, headache, diarrhea  Reviewed with patient importance of keeping a medication schedule and plan for any missed doses. No barriers to medication adherence identified.  Medication reconciliation performed and medication/allergy list updated.  All questions answered.  Katrina Walters voiced understanding and appreciation.   Medication education handout placed in mail for patient. Patient knows to call the office with questions or concerns. Oral Chemotherapy Clinic phone number provided to patient.   Jude Norton, PharmD, BCPS, BCOP Hematology/Oncology Clinical Pharmacist [facility] and [facility] Oral Chemotherapy Navigation Clinics [phone] 12/31/2023 10:21 AM

## 2024-01-01 ENCOUNTER — Other Ambulatory Visit: Payer: Self-pay

## 2024-01-05 ENCOUNTER — Encounter: Payer: Self-pay | Admitting: Family

## 2024-01-06 ENCOUNTER — Encounter: Payer: Self-pay | Admitting: Medical

## 2024-01-07 ENCOUNTER — Ambulatory Visit: Admitting: Medical

## 2024-01-09 ENCOUNTER — Other Ambulatory Visit: Payer: Self-pay | Admitting: Medical

## 2024-01-11 ENCOUNTER — Encounter: Payer: Self-pay | Admitting: Family

## 2024-01-11 ENCOUNTER — Encounter: Payer: Self-pay | Admitting: Podiatry

## 2024-01-11 ENCOUNTER — Ambulatory Visit: Admitting: Podiatry

## 2024-01-11 DIAGNOSIS — L600 Ingrowing nail: Secondary | ICD-10-CM

## 2024-01-11 NOTE — Progress Notes (Signed)
 Subjective:  Patient complains of painful ingrown thickened dystrophic border(s) toe hallux right. Patient denies fevers, chills, nausea, vomiting.  Objective:  Vitals: Reviewed  General: Well developed, nourished, in no acute distress, alert and oriented x3   Dermatology: Erythema, edema, incurvated nail border no drainage both nail fold first toe with gryphotic nail.. Tenderness present with palpation.  Skin is somewhat thin and atrophic.  Neurological: Grossly intact. Normal reflexes.   Musculoskeletal: Tenderness with palpation of the distal hallux toe right. No tenderness or painful ROM at IPJ.  Diagnosis:  Ingrown nail hallux right  Plan: -discussed etiology and treatment of ingrown nails. Discussed surgical vs conservative treatment. -Consent signed for appropriate matrixectomy affected nail(s).  Procedure(s):   - Matrixectomy(s) hallux nail right toe total nail: Toe anesthetized with 3cc 2:1 mixture 2% Lidocaine  plain: Sodium Bicarbonate . Surgical site prepped. Digital tourniquet applied.  Avulsion of entire nail toe Matrixecomy performed with three 30 second applications of phenol to nail matrix. Site irrigated with alcohol.  Tourniquet released with good vascularity noticed in digit.  Applied triple antibiotic to nailbed and applied gauze and Coban dressing. - Written and oral postoperative instructions given.  -Return for post-op 2 weeks. Condition and possible matrixectomy second toenail   J Manual Self, DPM

## 2024-01-13 MED ORDER — LOKELMA 5 G PO PACK: PACK | ORAL | 0 refills | Status: DC

## 2024-01-14 ENCOUNTER — Ambulatory Visit
Admission: RE | Admit: 2024-01-14 | Discharge: 2024-01-14 | Disposition: A | Discharge status: Home / Self Care | Source: Ambulatory Visit | Attending: Physician Assistant | Admitting: Physician Assistant

## 2024-01-14 ENCOUNTER — Other Ambulatory Visit: Payer: Self-pay

## 2024-01-14 VITALS — BP 154/77 | HR 68 | Temp 98.2°F | Resp 16 | Ht 63.0 in | Wt 99.0 lb

## 2024-01-14 DIAGNOSIS — S91209D Unspecified open wound of unspecified toe(s) with damage to nail, subsequent encounter: Secondary | ICD-10-CM | POA: Diagnosis not present

## 2024-01-14 DIAGNOSIS — M79674 Pain in right toe(s): Secondary | ICD-10-CM | POA: Diagnosis not present

## 2024-01-14 MED ORDER — TRIAMCINOLONE ACETONIDE 40 MG/ML IJ SUSP
40.0000 mg | Freq: Once | INTRAMUSCULAR | Status: AC
  Administered 2024-01-14: 40 mg via INTRAMUSCULAR

## 2024-01-14 MED ORDER — HYDROCODONE-ACETAMINOPHEN 5-325 MG PO TABS
1.0000 | ORAL_TABLET | Freq: Four times a day (QID) | ORAL | 0 refills | Status: DC | PRN
  Filled 2024-01-14: qty 5, 2d supply, fill #0

## 2024-01-14 NOTE — ED Triage Notes (Addendum)
 Pt presents with complaints of right big toe pain x 4 days. Pt states her podiatrist removed the nail bed on Monday, 5/5. Pt states there is an increase in tenderness and redness. There is also drainage of pus and minimal bleeding from toe. Pt currently rates her overall pain a 10/10. OTC Tylenol  taken with no relief. Attempted to soak toe in water but pain was unbearable.

## 2024-01-14 NOTE — Discharge Instructions (Addendum)
 You were seen today for concerns of right great toe pain following toenail removal with podiatry.  At this time your toe and the nailbed does not appear to be infected.  There is no signs of redness, swelling or increased warmth or drainage.  If at any point you start to develop the symptoms I recommend following up with your podiatrist for further evaluation and management.  You can also return to urgent care or go to the emergency room for very severe symptoms. To help with your pain and inflammation I have prescribed a Kenalog  40 mg steroid injection.  This should help reduce inflammation and swelling inside the toe since you are not able to take NSAIDs.  Please continue to take Tylenol  as directed. I have also sent in a brief supply of hydrocodone-acetaminophen  5-325 mg.  You can use this up to every 6 hours as needed for severe pain.  Please be aware that this can cause sedation and drowsiness and if taken with other medications that cause sedation and drowsiness it can cause you to stop breathing and fall asleep.  Please use it sparingly and when you do not need to remain alert or drive. If you continue to have concerns or questions I recommend following up with the podiatry office that completed the procedure. If you continue to have severe pain, develop severe swelling, redness, fever or chills, drainage that looks like pus I recommend following up with the emergency room for further evaluation as these could be signs of a medical emergency.

## 2024-01-14 NOTE — ED Provider Notes (Addendum)
 [facility]    CSN: [id number] Arrival date & time: 01/14/24  1809      History   Chief Complaint Chief Complaint  Patient presents with   Toe Pain    My big toe nail was removed 3 days ago , but the pain is really bad when I soak it and it's swollen , I'm afraid it might be infected - Entered by patient    HPI Katrina Walters is a 68 y.o. female.   HPI  She reports having right large toenail removed on 01/11/24 due to ongoing fungal infection of toenails. She states she is having increased pain and swelling, rating pain 10/10 with light palpation or walking  She has been taking Tylenol  but states this has not been helpful  She reports trying to soak as directed but states this is too painful to complete  They have not contacted podiatry with regards to concerns She reports the area has been draining blood and "white stuff"   She reports that she cannot take Ibuprofen  due to stomach issues     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   Dyssynergic defecation    Constipation due to outlet dysfunction    CAD (coronary artery disease)    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   SOB (shortness  of breath) 12/06/2014   Dyslipidemia, goal LDL below 70 12/04/2014   Palpitations 12/04/2014   Allergic rhinitis 12/04/2014   Binge eating disorder 12/04/2014    Past Surgical History:  Procedure Laterality Date   ANAL RECTAL MANOMETRY N/A 11/12/2020   Procedure: ANO RECTAL MANOMETRY;  Surgeon: Annis Kinder, DO;  Location: [facility];  Service: Gastroenterology;  Laterality: N/A;   BREAST REDUCTION SURGERY  2006   CARDIAC CATHETERIZATION N/A 07/17/2016   Procedure: Left Heart Cath and Coronary Angiography;  Surgeon: Odie Benne, MD;  Location: [facility] INVASIVE CV LAB;  Service: Cardiovascular;  Laterality: N/A;   CATARACT EXTRACTION W/ INTRAOCULAR LENS IMPLANT Bilateral    COLONOSCOPY  2015   CORONARY ARTERY BYPASS GRAFT N/A 06/04/2021   Procedure: CORONARY ARTERY BYPASS GRAFTING (CABG), ON PUMP, TIMES FOUR, USING LEFT INTERNAL MAMMARY ARTERY, LEFT RADIAL ARTERY (HARVESTED OPEN), AND RIGHT GREATER SAPHENOUS VEIN (HARVESTED ENDOSCOPICALLY);  Surgeon: Hilarie Lovely, MD;  Location: [facility];  Service: Open Heart Surgery;  Laterality: N/A;   EYE SURGERY     LEFT HEART CATH AND CORONARY ANGIOGRAPHY N/A 04/22/2021   Procedure: LEFT HEART CATH AND CORONARY ANGIOGRAPHY;  Surgeon: Arty Binning, MD;  Location: [facility] INVASIVE CV LAB;  Service: Cardiovascular;  Laterality: N/A;   POLYPECTOMY  2015   RADIAL ARTERY HARVEST Left 06/04/2021   Procedure: RADIAL ARTERY HARVEST;  Surgeon: Hilarie Lovely, MD;  Location: [facility] OR;  Service: Open Heart Surgery;  Laterality: Left;   TEE WITHOUT CARDIOVERSION N/A 06/04/2021   Procedure: TRANSESOPHAGEAL ECHOCARDIOGRAM (TEE);  Surgeon: Hilarie Lovely, MD;  Location: [facility] OR;  Service: Open Heart Surgery;  Laterality: N/A;   TONSILLECTOMY     VAGINAL HYSTERECTOMY  08/2019   w/ apical suspension, A&P repair 08/2019 at [facility]    OB History     Gravida  3   Para  1   Term  1   Preterm      AB  2   Living  1      SAB  2    IAB      Ectopic      Multiple      Live Births  1            Home Medications    Prior to Admission medications   Medication Sig Start Date End Date Taking? Authorizing Provider  HYDROcodone-acetaminophen  (NORCO/VICODIN) 5-325 MG tablet Take 1 tablet by mouth every 6 (six) hours as needed for severe pain (pain score 7-10). 01/14/24  Yes Montrae Braithwaite E, PA-C  aspirin  81 MG chewable tablet Chew by mouth daily.    [provider]  Bismuth  262 MG CHEW Chew 524 mg by mouth in the morning, at noon, in the evening, and at bedtime. 10/17/22   Cirigliano, Vito V, DO  colchicine  0.6 MG tablet Take 1 tablet (0.6 mg total) by mouth 2 (two) times daily. 05/14/23   Saguier, Gaylin Ke, PA-C  Cranberry-Vitamin C 250-60 MG CAPS Take 2 tablets by mouth daily.    [provider]  D-MANNOSE PO Take 2 tablets by mouth daily.    [provider]  denosumab  (PROLIA ) 60 MG/ML SOSY injection INJECT 1 SYRINGE UNDER THE SKIN ONCE EVERY 6 MONTHS 07/14/23   Saguier, Gaylin Ke, PA-C  diphenhydrAMINE  (BENADRYL ) 25 MG tablet Take 25 mg by mouth at bedtime.    [provider]  ezetimibe  (ZETIA ) 10 MG tablet Take 1 tablet (10 mg total) by mouth daily. Please keep scheduled appointment for additional refills. 11/11/23   O'NealCathay Clonts, MD  famciclovir  (FAMVIR ) 500 MG tablet Take 1 tablet (500 mg total) by mouth 3 (three) times daily. 11/23/23   Saguier, Gaylin Ke, PA-C  famotidine  (PEPCID ) 20 MG tablet Take 1 tablet (20 mg total) by mouth daily. 09/11/23   Saguier, Gaylin Ke, PA-C  FIBER PO Take 2 tablets by mouth daily.    [provider]  levocetirizine (XYZAL ) 5 MG tablet Take 1 tablet (5 mg total) by mouth every evening. 06/22/23   Saguier, Gaylin Ke, PA-C  linaclotide  (LINZESS ) 290 MCG CAPS capsule Take 1 capsule (290 mcg total) by mouth daily before breakfast. 10/12/23   Cirigliano, Vito V, DO  mupirocin  ointment (BACTROBAN ) 2 % Apply 1 Application topically 2 (two) times daily. 06/15/23    Saguier, Gaylin Ke, PA-C  nitazoxanide  (ALINIA ) 500 MG tablet Take 1 tablet (500 mg total) by mouth 2 (two) times daily with a meal. 10/28/22   Cirigliano, Vito V, DO  NON FORMULARY Take 0.5 tablets by mouth at bedtime. CBD oil - for pain Gummies    [provider]  pantoprazole  (PROTONIX ) 40 MG tablet Take 40 mg by mouth daily. Patient not taking: Reported on 01/11/2024 06/22/23   [provider]  Polyethyl Glycol-Propyl Glycol (SYSTANE  OP) Place 1 drop into both eyes in the morning and at bedtime.    [provider]  PREMARIN  vaginal cream Place 0.25 Applicatorfuls vaginally See admin instructions. Insert 0.5 grams vaginally every other night 12/17/23   Stinson, Jacob J, DO  progesterone  (PROMETRIUM ) 100 MG capsule Take 1 capsule (100 mg total) by mouth daily. 12/17/23   Stinson, Jacob J, DO  rosuvastatin  (CRESTOR ) 10 MG tablet Take 1 tablet (10 mg total) by mouth daily. 11/11/23   O'NealCathay Clonts, MD  ruxolitinib  phosphate (JAKAFI ) 5 MG tablet Take 1 tablet (5 mg total) by mouth 2 (two) times daily. 12/29/23   Sharla Davis, PA-C  sodium zirconium cyclosilicate  (LOKELMA ) 5 g packet 5 gram pack twice a week(Tuesday and Thursday) 01/13/24   Saguier, Gaylin Ke, PA-C  triamcinolone  cream (KENALOG ) 0.1 % Apply 1 Application topically 2 (two) times daily. 09/11/23   Saguier, Gaylin Ke, PA-C  Wheat Dextrin (BENEFIBER DRINK MIX PO) Take by mouth daily at 6 (six) AM.    [provider]    Family History Family History  Problem Relation Age of Onset   Heart disease Mother        died at 25 of heart attack   Hypertension Mother    Heart attack Mother    Heart disease Father        had heart attack at age 69   Heart attack Father    Rectal cancer Father 2   Hearing loss Father    Cancer Paternal Aunt    Stomach cancer Paternal Grandmother    Colon cancer Neg Hx    Colon polyps Neg Hx    Esophageal cancer Neg Hx     Social History Social History   Tobacco Use    Smoking status: Never   Smokeless tobacco: Never  Vaping Use   Vaping status: Never Used  Substance Use Topics   Alcohol use: Yes    Alcohol/week: 1.0 standard drink of alcohol    Types: 1 Glasses of wine per week    Comment: occassionally   Drug use: No    Comment: CBD gummies     Allergies   Amoxicillin    Review of Systems Review of Systems  Musculoskeletal:        Severe right great toe pain      Physical Exam Triage Vital Signs ED Triage Vitals  Encounter Vitals Group     BP 01/14/24 1821 (!) 154/77     Systolic BP Percentile --      Diastolic BP Percentile --      Pulse Rate 01/14/24 1821 68     Resp 01/14/24 1821 16     Temp 01/14/24 1821 98.2 F (36.8 C)     Temp Source 01/14/24 1821 Oral     SpO2 01/14/24 1821 98 %     Weight 01/14/24 1821 99 lb (44.9 kg)     Height 01/14/24 1821 5\' 3"  (1.6 m)     Head Circumference --      Peak Flow --      Pain Score 01/14/24 1833 10     Pain Loc --      Pain Education --      Exclude from Growth Chart --    No data found.  Updated Vital Signs BP (!) 154/77 (BP Location: Right Arm)   Pulse 68   Temp 98.2 F (36.8 C) (Oral)   Resp 16   Ht 5\' 3"  (1.6 m)   Wt 99  lb (44.9 kg)   LMP  (LMP Unknown)   SpO2 98%   BMI 17.54 kg/m   Visual Acuity Right Eye Distance:   Left Eye Distance:   Bilateral Distance:    Right Eye Near:   Left Eye Near:    Bilateral Near:     Physical Exam Vitals reviewed.  Constitutional:      General: She is awake. She is not in acute distress.    Appearance: Normal appearance. She is well-developed and well-groomed. She is not ill-appearing or toxic-appearing.  HENT:     Head: Normocephalic and atraumatic.  Pulmonary:     Effort: Pulmonary effort is normal.  Feet:     Comments: Patient has avulsed nail of the right great toe.  Area is mildly bloody but does not show signs of erythema, induration, fluctuance, purulent drainage, severe swelling or increased warmth compared to  surrounding tissue. She has capillary refill at the distal aspect of the toe which is less than 2 seconds and range of motion appears intact She does report tenderness along the nailbed. Neurological:     Mental Status: She is alert and oriented to person, place, and time.     GCS: GCS eye subscore is 4. GCS verbal subscore is 5. GCS motor subscore is 6.  Psychiatric:        Attention and Perception: Attention and perception normal.        Mood and Affect: Mood and affect normal.        Speech: Speech normal.        Behavior: Behavior normal. Behavior is cooperative.      UC Treatments / Results  Labs (all labs ordered are listed, but only abnormal results are displayed) Labs Reviewed - No data to display  EKG   Radiology No results found.  Procedures Procedures (including critical care time)  Medications Ordered in UC Medications  triamcinolone  acetonide (KENALOG -40) injection 40 mg (has no administration in time range)    Initial Impression / Assessment and Plan / UC Course  I have reviewed the triage vital signs and the nursing notes.  Pertinent labs & imaging results that were available during my care of the patient were reviewed by me and considered in my medical decision making (see chart for details).      Final Clinical Impressions(s) / UC Diagnoses   Final diagnoses:  Avulsion of toenail, subsequent encounter  Pain of right great toe   Patient presents today with concerns for right great toe pain following nail removal on Monday.  She reports that the pain is gradually been increasing since the procedure and today has become excruciating and prevented her from doing recommended foot soaks with Epsom salt.  She is concerned for swelling around the toe and reports that there has been some bloody drainage.  Physical exam is overall reassuring.  No signs of erythema, purulent drainage, severe swelling, increased warmth compared to surrounding tissue.  Less  suspicious for infection at this time given reassuring physical exam.  Patient reports that she has been taking Tylenol  every 4 hours as directed and she cannot take NSAIDs due to GI sensitivity.  Will provide Kenalog  40 mg injection to assist with potential inflammation and swelling.  To help with pain will provide hydrocodone-acetaminophen  5-3 25 to be used as needed up to every 6 hours for pain. PDMP reviewed, no evidence of worrisome controlled substance use patterns at this time.  ED and return precautions reviewed and provided in after visit  summary.  Recommend follow-up with podiatry for ongoing concerns.     Discharge Instructions      You were seen today for concerns of right great toe pain following toenail removal with podiatry.  At this time your toe and the nailbed does not appear to be infected.  There is no signs of redness, swelling or increased warmth or drainage.  If at any point you start to develop the symptoms I recommend following up with your podiatrist for further evaluation and management.  You can also return to urgent care or go to the emergency room for very severe symptoms. To help with your pain and inflammation I have prescribed a Kenalog  40 mg steroid injection.  This should help reduce inflammation and swelling inside the toe since you are not able to take NSAIDs.  Please continue to take Tylenol  as directed. I have also sent in a brief supply of hydrocodone-acetaminophen  5-325 mg.  You can use this up to every 6 hours as needed for severe pain.  Please be aware that this can cause sedation and drowsiness and if taken with other medications that cause sedation and drowsiness it can cause you to stop breathing and fall asleep.  Please use it sparingly and when you do not need to remain alert or drive. If you continue to have concerns or questions I recommend following up with the podiatry office that completed the procedure. If you continue to have severe pain, develop  severe swelling, redness, fever or chills, drainage that looks like pus I recommend following up with the emergency room for further evaluation as these could be signs of a medical emergency.   ED Prescriptions     Medication Sig Dispense Auth. Provider   HYDROcodone-acetaminophen  (NORCO/VICODIN) 5-325 MG tablet Take 1 tablet by mouth every 6 (six) hours as needed for severe pain (pain score 7-10). 5 tablet Arius Harnois E, PA-C      I have reviewed the PDMP during this encounter.   Jaeda Bruso, Pearla Bottom, PA-C 01/14/24 1952    Macallan Ord, Pearla Bottom, PA-C 01/14/24 1610

## 2024-01-15 ENCOUNTER — Other Ambulatory Visit: Payer: Self-pay

## 2024-01-19 ENCOUNTER — Other Ambulatory Visit (HOSPITAL_COMMUNITY): Payer: Self-pay

## 2024-01-19 ENCOUNTER — Telehealth: Payer: Self-pay

## 2024-01-19 NOTE — Telephone Encounter (Signed)
 Prolia  VOB initiated via [email or web address]  Next Prolia  inj DUE: 02/16/24

## 2024-01-19 NOTE — Telephone Encounter (Signed)
 Katrina Walters

## 2024-01-19 NOTE — Telephone Encounter (Signed)
 Pt ready for scheduling for PROLIA  on or after : 02/16/24  Option# 1: Buy/Bill (Office supplied medication)  Out-of-pocket cost due at time of clinic visit: $367  Number of injection/visits approved: 2  Primary: [employer] MEDICARE Prolia  co-insurance: 20% Admin fee co-insurance: $35  Secondary: --- Prolia  co-insurance:  Admin fee co-insurance:   Medical Benefit Details: Date Benefits were checked: 01/19/24 Deductible: NO/ Coinsurance: 20%/ Admin Fee: $35  Prior Auth: APPROVED PA# [id number] Expiration Date: 02/16/24-02/15/25  # of doses approved: 2 ----------------------------------------------------------------------- Option# 2- Med Obtained from pharmacy:  Pharmacy benefit: Copay $---REFILL TOO SOON. NEXT FILL 05/23/24 (Paid to pharmacy) Admin Fee: $35 (Pay at clinic)  Prior Auth: N/A PA# Expiration Date:   # of doses approved:   If patient wants fill through the pharmacy benefit please send prescription to: [facility], and include estimated need by date in rx notes. Pharmacy will ship medication directly to the office.  Patient NOT eligible for Prolia  Copay Card. Copay Card can make patient's cost as little as $25. Link to apply: [email or web address]  ** This summary of benefits is an estimation of the patient's out-of-pocket cost. Exact cost may very based on individual plan coverage.

## 2024-01-20 ENCOUNTER — Ambulatory Visit: Payer: Medicare Other | Admitting: Obstetrics and Gynecology

## 2024-01-21 ENCOUNTER — Other Ambulatory Visit: Payer: Self-pay

## 2024-01-25 ENCOUNTER — Ambulatory Visit (HOSPITAL_BASED_OUTPATIENT_CLINIC_OR_DEPARTMENT_OTHER)
Admission: RE | Admit: 2024-01-25 | Discharge: 2024-01-25 | Disposition: A | Discharge status: Home / Self Care | Source: Ambulatory Visit | Attending: Medical | Admitting: Medical

## 2024-01-25 ENCOUNTER — Other Ambulatory Visit: Payer: Self-pay

## 2024-01-25 ENCOUNTER — Encounter: Payer: Self-pay | Admitting: Family

## 2024-01-25 ENCOUNTER — Encounter (HOSPITAL_BASED_OUTPATIENT_CLINIC_OR_DEPARTMENT_OTHER): Payer: Self-pay

## 2024-01-25 DIAGNOSIS — Z1231 Encounter for screening mammogram for malignant neoplasm of breast: Secondary | ICD-10-CM | POA: Diagnosis present

## 2024-01-25 NOTE — Progress Notes (Signed)
 Clinical Intervention Note  Clinical Intervention Notes: Contacted patient for new therapy check in and refill call. She called back and states that she self-discontinued Jakafi  due to side effects of nausea and fatigue. I asked if she had spoken to provider about this and she has not. She stated that she has an appointment upcoming on 5/27. Advised her of the importance that she continue this medication or speak to her provider and she will call the office tomorrow to discuss further.   Clinical Intervention Outcomes: Improved therapy adherence   Rena Carnes Specialty Pharmacist

## 2024-01-27 ENCOUNTER — Other Ambulatory Visit: Payer: Self-pay

## 2024-01-28 ENCOUNTER — Encounter: Payer: Self-pay | Admitting: Medical

## 2024-01-28 ENCOUNTER — Ambulatory Visit: Payer: Self-pay | Admitting: Medical

## 2024-01-28 ENCOUNTER — Other Ambulatory Visit (HOSPITAL_COMMUNITY): Payer: Self-pay

## 2024-01-28 MED ORDER — FAMOTIDINE 20 MG PO TABS: ORAL_TABLET | ORAL | 11 refills | Status: DC

## 2024-01-28 NOTE — Addendum Note (Signed)
 Addended by: Serafina Damme on: 01/28/2024 04:13 PM   Modules accepted: Orders

## 2024-01-29 ENCOUNTER — Ambulatory Visit (INDEPENDENT_AMBULATORY_CARE_PROVIDER_SITE_OTHER): Payer: Medicare Other | Admitting: Otolaryngology

## 2024-01-29 ENCOUNTER — Encounter (INDEPENDENT_AMBULATORY_CARE_PROVIDER_SITE_OTHER): Payer: Self-pay | Admitting: Otolaryngology

## 2024-01-29 DIAGNOSIS — H6123 Impacted cerumen, bilateral: Secondary | ICD-10-CM | POA: Diagnosis not present

## 2024-01-29 NOTE — Progress Notes (Signed)
 Patient ID: Katrina Walters, female   DOB: [birth year], 68 y.o.   MRN: [id number]  Procedure: Bilateral cerumen disimpaction.     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     Description: The patient is placed supine on the exam table.  Under the operating microscope, the right ear canal is examined and is noted to be completely impacted with cerumen.  The cerumen is carefully removed with a combination of suction catheters, cerumen curette, and alligator forceps.  After the cerumen removal, the ear canal and tympanic membrane are noted to be normal.  No middle ear effusion is noted.  The same procedure is then repeated on the left side without exception. The patient tolerated the procedure well.   Follow-up care:   The patient will follow up in 4 months.

## 2024-02-01 ENCOUNTER — Other Ambulatory Visit: Payer: Self-pay | Admitting: Medical

## 2024-02-02 ENCOUNTER — Inpatient Hospital Stay (HOSPITAL_BASED_OUTPATIENT_CLINIC_OR_DEPARTMENT_OTHER): Admitting: Family

## 2024-02-02 ENCOUNTER — Inpatient Hospital Stay

## 2024-02-02 ENCOUNTER — Encounter: Payer: Self-pay | Admitting: Family

## 2024-02-02 ENCOUNTER — Encounter: Payer: Self-pay | Admitting: *Deleted

## 2024-02-02 ENCOUNTER — Inpatient Hospital Stay: Attending: Hematology & Oncology

## 2024-02-02 VITALS — BP 129/81 | HR 81 | Temp 98.1°F | Resp 18 | Wt 100.1 lb

## 2024-02-02 DIAGNOSIS — R200 Anesthesia of skin: Secondary | ICD-10-CM | POA: Diagnosis not present

## 2024-02-02 DIAGNOSIS — R5383 Other fatigue: Secondary | ICD-10-CM | POA: Insufficient documentation

## 2024-02-02 DIAGNOSIS — D509 Iron deficiency anemia, unspecified: Secondary | ICD-10-CM

## 2024-02-02 DIAGNOSIS — R202 Paresthesia of skin: Secondary | ICD-10-CM | POA: Insufficient documentation

## 2024-02-02 DIAGNOSIS — Z79899 Other long term (current) drug therapy: Secondary | ICD-10-CM | POA: Diagnosis not present

## 2024-02-02 DIAGNOSIS — Z7982 Long term (current) use of aspirin: Secondary | ICD-10-CM | POA: Insufficient documentation

## 2024-02-02 DIAGNOSIS — R61 Generalized hyperhidrosis: Secondary | ICD-10-CM | POA: Insufficient documentation

## 2024-02-02 DIAGNOSIS — D45 Polycythemia vera: Secondary | ICD-10-CM | POA: Diagnosis not present

## 2024-02-02 DIAGNOSIS — D75839 Thrombocytosis, unspecified: Secondary | ICD-10-CM

## 2024-02-02 DIAGNOSIS — D751 Secondary polycythemia: Secondary | ICD-10-CM

## 2024-02-02 LAB — CMP (CANCER CENTER ONLY)
ALT: 16 U/L (ref 0–44)
AST: 22 U/L (ref 15–41)
Albumin: 5.1 g/dL — ABNORMAL HIGH (ref 3.5–5.0)
Alkaline Phosphatase: 86 U/L (ref 38–126)
Anion gap: 8 (ref 5–15)
BUN: 13 mg/dL (ref 8–23)
CO2: 27 mmol/L (ref 22–32)
Calcium: 10.2 mg/dL (ref 8.9–10.3)
Chloride: 99 mmol/L (ref 98–111)
Creatinine: 0.78 mg/dL (ref 0.44–1.00)
GFR, Estimated: >60 mL/min (ref >60)
Glucose, Bld: 93 mg/dL (ref 70–99)
Potassium: 5 mmol/L (ref 3.5–5.1)
Sodium: 134 mmol/L — ABNORMAL LOW (ref 135–145)
Total Bilirubin: 0.7 mg/dL (ref 0.0–1.2)
Total Protein: 8.3 g/dL — ABNORMAL HIGH (ref 6.5–8.1)

## 2024-02-02 LAB — LIPID PANEL
Cholesterol: 112 mg/dL (ref 0–200)
HDL: 40 mg/dL — ABNORMAL LOW (ref >40)
LDL Cholesterol: 56 mg/dL (ref 0–99)
Total CHOL/HDL Ratio: 2.8 ratio
Triglycerides: 80 mg/dL (ref <150)
VLDL: 16 mg/dL (ref 0–40)

## 2024-02-02 LAB — CBC WITH DIFFERENTIAL (CANCER CENTER ONLY)
Abs Immature Granulocytes: 0.14 10*3/uL — ABNORMAL HIGH (ref 0.00–0.07)
Basophils Absolute: 0.1 10*3/uL (ref 0.0–0.1)
Basophils Relative: 1 %
Eosinophils Absolute: 0.4 10*3/uL (ref 0.0–0.5)
Eosinophils Relative: 3 %
HCT: 43.2 % (ref 36.0–46.0)
Hemoglobin: 12 g/dL (ref 12.0–15.0)
Immature Granulocytes: 1 %
Lymphocytes Relative: 14 %
Lymphs Abs: 2.2 10*3/uL (ref 0.7–4.0)
MCH: 18 pg — ABNORMAL LOW (ref 26.0–34.0)
MCHC: 27.8 g/dL — ABNORMAL LOW (ref 30.0–36.0)
MCV: 64.9 fL — ABNORMAL LOW (ref 80.0–100.0)
Monocytes Absolute: 0.8 10*3/uL (ref 0.1–1.0)
Monocytes Relative: 5 %
Neutro Abs: 12.3 10*3/uL — ABNORMAL HIGH (ref 1.7–7.7)
Neutrophils Relative %: 76 %
Platelet Count: 591 10*3/uL — ABNORMAL HIGH (ref 150–400)
RBC: 6.66 MIL/uL — ABNORMAL HIGH (ref 3.87–5.11)
RDW: 22.6 % — ABNORMAL HIGH (ref 11.5–15.5)
WBC Count: 15.9 10*3/uL — ABNORMAL HIGH (ref 4.0–10.5)
nRBC: 0 % (ref 0.0–0.2)

## 2024-02-02 LAB — FERRITIN: Ferritin: 9 ng/mL — ABNORMAL LOW (ref 11–307)

## 2024-02-02 LAB — LACTATE DEHYDROGENASE
LDH: 168 U/L (ref 98–192)
LDH: 175 U/L (ref 98–192)

## 2024-02-02 LAB — IRON AND IRON BINDING CAPACITY (CC-WL,HP ONLY)
Iron: 28 ug/dL (ref 28–170)
Saturation Ratios: 6 % — ABNORMAL LOW (ref 10.4–31.8)
TIBC: 497 ug/dL — ABNORMAL HIGH (ref 250–450)
UIBC: 469 ug/dL — ABNORMAL HIGH (ref 148–442)

## 2024-02-02 NOTE — Progress Notes (Unsigned)
 Hematology and Oncology Follow Up Visit  Katrina Walters [id number] 24-Jul-1956 68 y.o. 02/02/2024   Principle Diagnosis:  Polycythemia Vera -- JAK2 (+)   Current Therapy:        Phlebotomy to maintain HCT below 45% Aspirin  81 mg PO daily Jakafi - 10 mg BID - started on 12/29/2023 - DC'd by patient on 01/25/2024 d/t intolerance.    Interim History:  Katrina Walters is here today for follow-up. Unfortunately the Jakafi  caused significant fatigue and nausea for her so she stopped taking over a week ago.  Her counts are stable with WBC count 15.9, Hgb 12.0, MCV 64 and platelets 591.  She notes fatigue and persistent night sweats.  Chronic numbness and tingling in her feet/toes.  No falls or syncope reported. No issue with infection. No fever, chills, n/v, cough, rash, dizziness, SOB, chest pain, palpitations, abdominal pain or changes in bowel or bladder habits.  She had her right great toenail removed earlier this month due to a fungal infection and the toe appears to be healing nicely.  Appetite and hydration are good. Weight is stable at 100 lbs.   ECOG Performance Status: 1 - Symptomatic but completely ambulatory  Medications:  Allergies as of 02/02/2024       Reactions   Amoxicillin     Had fever with flu like symptoms. Has patient had a PCN reaction causing immediate rash, facial/tongue/throat swelling, SOB or lightheadedness with hypotension: no Has patient had a PCN reaction causing severe rash involving mucus membranes or skin necrosis: no Has patient had a PCN reaction that required hospitalization no Has patient had a PCN reaction occurring within the last 10 years: yes If all of the above answers are "NO", then may proceed with Cephalosporin use.        Medication List        Accurate as of Feb 02, 2024 11:51 AM. If you have any questions, ask your nurse or doctor.          aspirin  81 MG chewable tablet Chew by mouth daily.   BENEFIBER DRINK MIX PO Take by mouth daily  at 6 (six) AM.   Bismuth  262 MG Chew Chew 524 mg by mouth in the morning, at noon, in the evening, and at bedtime.   colchicine  0.6 MG tablet Take 1 tablet (0.6 mg total) by mouth 2 (two) times daily.   Cranberry-Vitamin C 250-60 MG Caps Take 2 tablets by mouth daily.   D-MANNOSE PO Take 2 tablets by mouth daily.   diphenhydrAMINE  25 MG tablet Commonly known as: BENADRYL  Take 25 mg by mouth at bedtime.   ezetimibe  10 MG tablet Commonly known as: ZETIA  Take 1 tablet (10 mg total) by mouth daily. Please keep scheduled appointment for additional refills.   famciclovir  500 MG tablet Commonly known as: FAMVIR  Take 1 tablet (500 mg total) by mouth 3 (three) times daily.   famotidine  20 MG tablet Commonly known as: PEPCID  2 tab po q day   FIBER PO Take 2 tablets by mouth daily.   HYDROcodone -acetaminophen  5-325 MG tablet Commonly known as: NORCO/VICODIN Take 1 tablet by mouth every 6 (six) hours as needed for severe pain (pain score 7-10).   Jakafi  5 MG tablet Generic drug: ruxolitinib  phosphate Take 1 tablet (5 mg total) by mouth 2 (two) times daily.   levocetirizine 5 MG tablet Commonly known as: XYZAL  TAKE 1 TABLET BY MOUTH EVERY DAY IN THE EVENING   linaclotide  290 MCG Caps capsule Commonly known as: LINZESS  Take 1  capsule (290 mcg total) by mouth daily before breakfast.   Lokelma  5 g packet Generic drug: sodium zirconium cyclosilicate  5 gram pack twice a week(Tuesday and Thursday)   mupirocin  ointment 2 % Commonly known as: BACTROBAN  Apply 1 Application topically 2 (two) times daily.   nitazoxanide  500 MG tablet Commonly known as: ALINIA  Take 1 tablet (500 mg total) by mouth 2 (two) times daily with a meal.   NON FORMULARY Take 0.5 tablets by mouth at bedtime. CBD oil - for pain Gummies   pantoprazole  40 MG tablet Commonly known as: PROTONIX  Take 40 mg by mouth daily.   Premarin  vaginal cream Generic drug: conjugated estrogens  Place 0.25  Applicatorfuls vaginally See admin instructions. Insert 0.5 grams vaginally every other night   progesterone  100 MG capsule Commonly known as: Prometrium  Take 1 capsule (100 mg total) by mouth daily.   Prolia  60 MG/ML Sosy injection Generic drug: denosumab  INJECT 1 SYRINGE UNDER THE SKIN ONCE EVERY 6 MONTHS   rosuvastatin  10 MG tablet Commonly known as: CRESTOR  Take 1 tablet (10 mg total) by mouth daily.   SYSTANE OP Place 1 drop into both eyes in the morning and at bedtime.   triamcinolone  cream 0.1 % Commonly known as: KENALOG  Apply 1 Application topically 2 (two) times daily.        Allergies:  Allergies  Allergen Reactions   Amoxicillin      Had fever with flu like symptoms. Has patient had a PCN reaction causing immediate rash, facial/tongue/throat swelling, SOB or lightheadedness with hypotension: no Has patient had a PCN reaction causing severe rash involving mucus membranes or skin necrosis: no Has patient had a PCN reaction that required hospitalization no Has patient had a PCN reaction occurring within the last 10 years: yes If all of the above answers are "NO", then may proceed with Cephalosporin use.    Past Medical History, Surgical history, Social history, and Family History were reviewed and updated.  Review of Systems: All other 10 point review of systems is negative.   Physical Exam:  vitals were not taken for this visit.   Wt Readings from Last 3 Encounters:  01/14/24 99 lb (44.9 kg)  12/29/23 101 lb 3.2 oz (45.9 kg)  12/17/23 101 lb (45.8 kg)    Ocular: Sclerae unicteric, pupils equal, round and reactive to light Ear-nose-throat: Oropharynx clear, dentition fair Lymphatic: No cervical or supraclavicular adenopathy Lungs no rales or rhonchi, good excursion bilaterally Heart regular rate and rhythm, no murmur appreciated Abd soft, nontender, positive bowel sounds MSK no focal spinal tenderness, no joint edema Neuro: non-focal, well-oriented,  appropriate affect Breasts: Deferred   Lab Results  Component Value Date   WBC 15.9 (H) 02/02/2024   HGB 12.0 02/02/2024   HCT 43.2 02/02/2024   MCV 64.9 (L) 02/02/2024   PLT 591 (H) 02/02/2024   Lab Results  Component Value Date   FERRITIN 14 12/29/2023   IRON  22 (L) 12/29/2023   TIBC 420 12/29/2023   UIBC 398 12/29/2023   IRONPCTSAT 5 (L) 12/29/2023   Lab Results  Component Value Date   RETICCTPCT 1.7 10/27/2023   RBC 6.66 (H) 02/02/2024   No results found for: "KPAFRELGTCHN", "LAMBDASER", "KAPLAMBRATIO" No results found for: "IGGSERUM", "IGA", "IGMSERUM" No results found for: "TOTALPROTELP", "ALBUMINELP", "A1GS", "A2GS", "BETS", "BETA2SER", "GAMS", "MSPIKE", "SPEI"   Chemistry      Component Value Date/Time   NA 134 (L) 02/02/2024 1054   NA 139 04/15/2021 1027   K 5.0 02/02/2024 1054   CL  99 02/02/2024 1054   CO2 27 02/02/2024 1054   BUN 13 02/02/2024 1054   BUN 14 04/15/2021 1027   CREATININE 0.78 02/02/2024 1054   CREATININE 0.84 07/14/2016 0914      Component Value Date/Time   CALCIUM  10.2 02/02/2024 1054   ALKPHOS 86 02/02/2024 1054   AST 22 02/02/2024 1054   ALT 16 02/02/2024 1054   BILITOT 0.7 02/02/2024 1054       Impression and Plan: Katrina Walters is a very pleasant 68 yo caucasian female with polycythemia vera, JAK 2 positive. She also has IDA.  Iron  studies are pending.  No phlebotomy needed this visit, Hct 43%. Jakafi  discontinued and we will have her try Hydrea 500 mg PO every other day.  She will also start taking a probiotic daily.  Follow-up in 6 weeks.   Kennard Pea, NP 5/27/202511:51 AM

## 2024-02-03 ENCOUNTER — Encounter: Payer: Self-pay | Admitting: Family

## 2024-02-03 ENCOUNTER — Other Ambulatory Visit: Payer: Self-pay

## 2024-02-03 ENCOUNTER — Other Ambulatory Visit: Payer: Self-pay | Admitting: Family

## 2024-02-03 NOTE — Progress Notes (Signed)
 Patient self-discontinued Jakafi  due to side effects. Provider aware as of appointment on 5/27. Per Ivette Marks ok to disenroll at this time.

## 2024-02-12 ENCOUNTER — Ambulatory Visit: Admitting: Gastroenterology

## 2024-02-18 ENCOUNTER — Ambulatory Visit: Admitting: *Deleted

## 2024-02-18 ENCOUNTER — Telehealth: Payer: Self-pay | Admitting: *Deleted

## 2024-02-18 DIAGNOSIS — M810 Age-related osteoporosis without current pathological fracture: Secondary | ICD-10-CM | POA: Diagnosis not present

## 2024-02-18 MED ORDER — DENOSUMAB 60 MG/ML ~~LOC~~ SOSY: 60.0000 mg | PREFILLED_SYRINGE | SUBCUTANEOUS | Status: DC

## 2024-02-18 NOTE — Progress Notes (Signed)
 Patient here for Prolia  injection per physicians orders  Prolia  60 mg SQ , was administered Lt arm today. Patient tolerated injection.  Patient next injection due: 6 months, appt made:  No- will schedule in 5 months after benefits are ran again  Initial injection: 03/15/21 Did Prolia  come from pharmacy (if yes please select patient supplied): yes  Cam placed for next injection: yes.

## 2024-02-18 NOTE — Telephone Encounter (Signed)
 Pt came in for Prolia  injection today.  She is not currently taking calcium  or vitamin D  and prolia  recommendations are to take calcium  and vit D to prevent deficiency while getting these injections. Please advise if you want pt to start these and if so, at what doses?

## 2024-02-23 NOTE — Telephone Encounter (Signed)
 Spoke with pt. She is not currently taking Calcium  or Vit D and states she has had no problems when taking them in the past. She will begin calcium  600mg  twice a day and Vit D 1000 units once a day. Med list updated.

## 2024-02-24 ENCOUNTER — Encounter: Payer: Self-pay | Admitting: Medical

## 2024-02-24 ENCOUNTER — Other Ambulatory Visit: Payer: Self-pay | Admitting: Medical

## 2024-02-25 NOTE — Telephone Encounter (Signed)
 Sent in 3 month rx of lokelma  with one refil.

## 2024-02-25 NOTE — Addendum Note (Signed)
 Addended by: Serafina Damme on: 02/25/2024 06:31 PM   Modules accepted: Orders

## 2024-03-01 ENCOUNTER — Encounter

## 2024-03-01 ENCOUNTER — Other Ambulatory Visit

## 2024-03-01 ENCOUNTER — Ambulatory Visit: Admitting: Medical Oncology

## 2024-03-07 ENCOUNTER — Encounter: Payer: Self-pay | Admitting: Medical

## 2024-03-07 MED ORDER — DICLOFENAC SODIUM 75 MG PO TBEC
75.0000 mg | DELAYED_RELEASE_TABLET | Freq: Two times a day (BID) | ORAL | 0 refills | Status: DC
Start: 2024-03-07 — End: 2024-06-21

## 2024-03-07 NOTE — Addendum Note (Signed)
 Addended by: DORINA DALLAS HERO on: 03/07/2024 08:54 PM   Modules accepted: Orders

## 2024-03-10 ENCOUNTER — Encounter: Payer: Self-pay | Admitting: Gastroenterology

## 2024-03-10 ENCOUNTER — Ambulatory Visit: Admitting: Gastroenterology

## 2024-03-10 VITALS — BP 116/62 | HR 84 | Ht 61.5 in | Wt 101.2 lb

## 2024-03-10 DIAGNOSIS — Z860100 Personal history of colon polyps, unspecified: Secondary | ICD-10-CM | POA: Diagnosis not present

## 2024-03-10 DIAGNOSIS — K648 Other hemorrhoids: Secondary | ICD-10-CM | POA: Diagnosis not present

## 2024-03-10 DIAGNOSIS — K581 Irritable bowel syndrome with constipation: Secondary | ICD-10-CM

## 2024-03-10 DIAGNOSIS — Z8619 Personal history of other infectious and parasitic diseases: Secondary | ICD-10-CM

## 2024-03-10 DIAGNOSIS — K219 Gastro-esophageal reflux disease without esophagitis: Secondary | ICD-10-CM | POA: Diagnosis not present

## 2024-03-10 DIAGNOSIS — R12 Heartburn: Secondary | ICD-10-CM

## 2024-03-10 DIAGNOSIS — K641 Second degree hemorrhoids: Secondary | ICD-10-CM

## 2024-03-10 DIAGNOSIS — A048 Other specified bacterial intestinal infections: Secondary | ICD-10-CM

## 2024-03-10 MED ORDER — ESOMEPRAZOLE MAGNESIUM 40 MG PO CPDR
DELAYED_RELEASE_CAPSULE | ORAL | 3 refills | Status: DC
Start: 2024-03-10 — End: 2024-05-18

## 2024-03-10 MED ORDER — TRULANCE 3 MG PO TABS
3.0000 mg | ORAL_TABLET | Freq: Every day | ORAL | 3 refills | Status: DC
Start: 2024-03-10 — End: 2024-05-13

## 2024-03-10 NOTE — Progress Notes (Signed)
 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  Previously trialed Dulcolax qod and increased hydration without much response, MiraLAX. - 02/2020: Abdominal x-ray with abundant stool burden.  Trialed Amitiza  in 02/2020 with good efficacy but stopped due to upset stomach. - 03/2020: Started on Linzess  with good clinical response, but reduced efficacy in early 2022.  (Interestingly, constipation started after hysterectomy in 2020.  Prior baseline was BM Q3d without straining) -10/2020: Sitz marker study: 10 markers remain with 9 in the transverse colon indicating slow colonic transit.  Started Trulance  -11/2020: ARM: Decreased resting pressure with normal squeeze.  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  Referred to PT - 11/2020: Trulance  was suboptimally efficacious.  Stopped and went back to Linzess  290 mcg -09/2022: GI follow-up appointment.  Completed pelvic floor PT and constipation well-controlled with Linzess  with BM every 1-2 days without straining.  Was having BRB on tissue paper.  New issue was reflux, well-controlled with omeprazole  40 mg daily. - 10/09/2022: EGD: 1 cm sliding HH with Hill grade 3 valve, mild gastritis (path: H. pylori positive).  Normal duodenum (path benign)   History of internal hemorrhoids with intermittent scant BRBPR, now incompletely responsive to OTC therapy. - 12/23/2022: Banding of RA hemorrhoid - 03/04/2023: Banding of LL hemorrhoid - 04/28/2023: Banding of RP hemorrhoid   Family history notable for father with colon cancer in his 45s.  Separately, history of H. pylori gastritis.  Has been intolerant to trials of medication in the  past, typically only taking for 1 day then stopping (nausea/vertigo), along with reported PCN allergy.  Stool testing with clarithromycin  sensitive H. pylori.  She was referred to the ID clinic in 03/2023 and recommended triple Abx w/ clarithromycin /amoxicillin /omeprazole  x 14 days.  Patient stopped treatment after 1 day with dizziness, nausea, bloating.  Tried antiemetics, but patient still did not want to proceed with further antibiotics.  Had follow-up in the ID clinic on 04/15/2023 and patient ultimately opted for no further antimicrobial therapy.   Endoscopic history: -Colonoscopy (08/17/2014, Dr. Mervin at [facility]): Single small polypoid polyp at splenic flexure removed with snare (path: Unknown), Internal hemorrhoids. -Colonoscopy (03/2020, Dr. San): 2 rectal polyps (1 tubular adenoma), tortuous sigmoid colon, lipoma in the ascending colon, internal hemorrhoids.  Repeat in 7 years - EGD (10/2022): 1 cm sliding hiatal hernia, Hill grade 3 valve, mild H. pylori gastritis, normal duodenum (path benign)  HPI:     Patient is a 68 y.o. female presenting to the Gastroenterology Clinic for follow-up and medication refill.   Main issue today is her chronic constipation. Linzess  has been working well, but less effective over last few months. Now with BM every 3 days. Using fiber supplement, laxative on demand, and MoM once/week.   For her reflux, famotidine  has also become less efficacious lately.  Still taking 20 mg twice daily, but episodic globus sensation and occasional breakthrough heartburn.  Symptoms worse with spicy foods.  Requesting medication change.  No dysphagia.    She would also like to discuss her history of H. pylori gastritis.  As outlined above, intolerant to multiple trials of antimicrobial therapy through the GI clinic and ID clinic.  Follows in the Hematology Clinic for polycythemia vera, JAK2 positive, along  with iron  deficiency anemia.  Did not tolerate Jak FE and now  starting Hydrea with probiotics.   Review of systems:     No chest pain, no SOB, no fevers, no urinary sx   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 89 years in [place], managed on colchicine    Osteoporosis 09/2016   Peripheral arterial disease (HCC)    Polycythemia vera (HCC)    PONV (postoperative nausea and vomiting)     Patient's surgical history, family medical history, social history, medications and allergies were all reviewed in Epic    Current Outpatient Medications  Medication Sig Dispense Refill   aspirin  81 MG chewable tablet Chew by mouth daily.     Bismuth  262 MG CHEW Chew 524 mg by mouth in the morning, at noon, in the evening, and at bedtime. 112 tablet 0   Calcium  Carbonate (CALCIUM  600 PO) Take 600 mg by mouth 2 (two) times daily.     cholecalciferol (VITAMIN D3) 25 MCG (1000 UNIT) tablet Take 1,000 Units by mouth daily.     colchicine  0.6 MG tablet Take 1 tablet (0.6 mg total) by mouth 2 (two) times daily. 180 tablet 3   Cranberry-Vitamin C 250-60 MG CAPS Take 2 tablets by mouth daily.     D-MANNOSE PO Take 2 tablets by mouth daily.     denosumab  (PROLIA ) 60 MG/ML SOSY injection INJECT 1 SYRINGE UNDER THE SKIN ONCE EVERY 6 MONTHS 1 mL 1   diclofenac  (VOLTAREN ) 75 MG EC tablet Take 1 tablet (75 mg total) by mouth 2 (two) times daily. 14 tablet 0   diphenhydrAMINE  (BENADRYL ) 25 MG tablet Take 25 mg by mouth at bedtime.     ezetimibe  (ZETIA ) 10 MG tablet Take 1 tablet (10 mg total) by mouth daily. Please keep scheduled appointment for additional refills. 90 tablet 4   famotidine  (PEPCID ) 20 MG tablet 2 tab po q day 60 tablet 11   FIBER PO Take 2 tablets by mouth daily.     HYDROcodone -acetaminophen   (NORCO/VICODIN) 5-325 MG tablet Take 1 tablet by mouth every 6 (six) hours as needed for severe pain (pain score 7-10). 5 tablet 0   levocetirizine (XYZAL ) 5 MG tablet TAKE 1 TABLET BY MOUTH EVERY DAY IN THE EVENING 90 tablet 1   linaclotide  (LINZESS ) 290 MCG CAPS capsule Take 1 capsule (290 mcg total) by mouth daily before breakfast. 90 capsule 2   LOKELMA  5 g packet 5 GRAM PACK TWICE A WEEK(TUESDAY AND THURSDAY) 24 packet 1   mupirocin  ointment (BACTROBAN ) 2 % Apply 1 Application topically 2 (two) times daily. 22 g 0   nitazoxanide  (ALINIA ) 500 MG tablet Take 1 tablet (500 mg total) by mouth 2 (two) times daily with a meal. 20 tablet 0   NON FORMULARY Take 0.5 tablets by mouth at bedtime. CBD oil - for pain Gummies     Polyethyl Glycol-Propyl Glycol (SYSTANE OP) Place 1 drop into both eyes in the morning and at bedtime.     PREMARIN  vaginal cream Place 0.25 Applicatorfuls vaginally See admin instructions. Insert 0.5 grams vaginally every other night 30 g 12   PRESCRIPTION  MEDICATION CBD gummies nightly     progesterone  (PROMETRIUM ) 100 MG capsule Take 1 capsule (100 mg total) by mouth daily. 90 capsule 3   rosuvastatin  (CRESTOR ) 10 MG tablet Take 1 tablet (10 mg total) by mouth daily. 90 tablet 3   triamcinolone  cream (KENALOG ) 0.1 % Apply 1 Application topically 2 (two) times daily. 30 g 1   Wheat Dextrin (BENEFIBER DRINK MIX PO) Take by mouth daily at 6 (six) AM.     famciclovir  (FAMVIR ) 500 MG tablet Take 1 tablet (500 mg total) by mouth 3 (three) times daily. (Patient not taking: Reported on 02/02/2024) 21 tablet 0   Current Facility-Administered Medications  Medication Dose Route Frequency Provider Last Rate Last Admin   [START ON 08/16/2024] denosumab  (PROLIA ) injection 60 mg  60 mg Subcutaneous Q6 months Saguier, Edward, PA-C        Physical Exam:     BP 116/62 (BP Location: Left Arm, Patient Position: Sitting, Cuff Size: Normal)   Pulse 84   Ht 5' 1.5 (1.562 m) Comment: height  measured without shoes  Wt 101 lb 4 oz (45.9 kg)   LMP  (LMP Unknown)   BMI 18.82 kg/m   GENERAL:  Pleasant female in NAD PSYCH: : Cooperative, normal affect NEURO: Alert and oriented x 3, no focal neurologic deficits   IMPRESSION and PLAN:    1) IBS-C Linzess  has become less efficacious over time. - Change to Trulance  3 mg daily.  Hold MiraLAX or other laxatives when starting Trulance  - Stop Linzess  when starting Trulance  - Can reintroduce laxatives as needed 1-2 weeks after starting Trulance  - Continue fiber supplement - Continue adequate hydration with at least 64 ounces of water daily  2) GERD 3) Heartburn - Stop Pepcid  - Start Nexium at 40 mg BID x4 weeks, then reduce to 40 mg daily as able for continued control of reflux - If breakthrough symptoms on Nexium, can reintroduce Pepcid  at nighttime - Continue antireflux lifestyle/dietary modifications with avoidance of exacerbating foods  4) History of H. pylori gastritis Has not tolerated multiple trials of antimicrobial therapy.  Has been referred to the ID clinic, and again did not tolerate repeat antibiotic regimen and patient has since elected to not try more antibiotics - May consider periodic EGD given intolerance ot H pylori therapy  5) Internal hemorrhoids - Completed hemorrhoid banding series  6) History of colon polyps - Repeat colonoscopy in 2028 for ongoing polyp surveillance  RTC in 12 months or sooner prn          Sandor GAILS Jackelyne Sayer ,DO, FACG 03/10/2024, 11:32 AM

## 2024-03-10 NOTE — Patient Instructions (Addendum)
 _______________________________________________________  If your blood pressure at your visit was 140/90 or greater, please contact your primary care physician to follow up on this. _______________________________________________________  If you are age 67 or older, your body mass index should be between 23-30. Your Body mass index is 18.82 kg/m. If this is out of the aforementioned range listed, please consider follow up with your Primary Care Provider.  If you are age 71 or younger, your body mass index should be between 19-25. Your Body mass index is 18.82 kg/m. If this is out of the aformentioned range listed, please consider follow up with your Primary Care Provider.  ________________________________________________________  The [facility] providers would like to encourage you to use MYCHART to communicate with providers for non-urgent requests or questions.  Due to long hold times on the telephone, sending your provider a message by [facility] may be a faster and more efficient way to get a response.  Please allow 48 business hours for a response.  Please remember that this is for non-urgent requests.  _______________________________________________________  DISCONTINUE: Linzess  and famotidine   START: Nexium 40mg  one capsule two times daily for 4 weeks, then reduce to daily  START: Trulance  3mg  one tablet daily  It was a pleasure to see you today!  Vito Cirigliano, D.O.

## 2024-03-15 ENCOUNTER — Other Ambulatory Visit: Payer: Self-pay | Admitting: Hematology & Oncology

## 2024-03-15 ENCOUNTER — Inpatient Hospital Stay: Attending: Hematology & Oncology

## 2024-03-15 ENCOUNTER — Encounter: Payer: Self-pay | Admitting: Medical

## 2024-03-15 ENCOUNTER — Encounter: Payer: Self-pay | Admitting: Family

## 2024-03-15 ENCOUNTER — Inpatient Hospital Stay

## 2024-03-15 ENCOUNTER — Inpatient Hospital Stay (HOSPITAL_BASED_OUTPATIENT_CLINIC_OR_DEPARTMENT_OTHER): Admitting: Family

## 2024-03-15 VITALS — BP 119/70 | HR 85 | Temp 98.5°F | Resp 17 | Ht 61.5 in | Wt 99.1 lb

## 2024-03-15 DIAGNOSIS — D45 Polycythemia vera: Secondary | ICD-10-CM | POA: Insufficient documentation

## 2024-03-15 DIAGNOSIS — D509 Iron deficiency anemia, unspecified: Secondary | ICD-10-CM

## 2024-03-15 DIAGNOSIS — Z7982 Long term (current) use of aspirin: Secondary | ICD-10-CM | POA: Diagnosis not present

## 2024-03-15 LAB — FERRITIN: Ferritin: 9 ng/mL — ABNORMAL LOW (ref 11–307)

## 2024-03-15 LAB — CBC WITH DIFFERENTIAL (CANCER CENTER ONLY)
Abs Immature Granulocytes: 0.17 K/uL — ABNORMAL HIGH (ref 0.00–0.07)
Basophils Absolute: 0.1 K/uL (ref 0.0–0.1)
Basophils Relative: 1 %
Eosinophils Absolute: 0.3 K/uL (ref 0.0–0.5)
Eosinophils Relative: 2 %
HCT: 40.7 % (ref 36.0–46.0)
Hemoglobin: 11.4 g/dL — ABNORMAL LOW (ref 12.0–15.0)
Immature Granulocytes: 1 %
Lymphocytes Relative: 11 %
Lymphs Abs: 1.5 K/uL (ref 0.7–4.0)
MCH: 17.7 pg — ABNORMAL LOW (ref 26.0–34.0)
MCHC: 28 g/dL — ABNORMAL LOW (ref 30.0–36.0)
MCV: 63.1 fL — ABNORMAL LOW (ref 80.0–100.0)
Monocytes Absolute: 0.6 K/uL (ref 0.1–1.0)
Monocytes Relative: 5 %
Neutro Abs: 10.6 K/uL — ABNORMAL HIGH (ref 1.7–7.7)
Neutrophils Relative %: 80 %
Platelet Count: 530 K/uL — ABNORMAL HIGH (ref 150–400)
RBC: 6.45 MIL/uL — ABNORMAL HIGH (ref 3.87–5.11)
RDW: 24 % — ABNORMAL HIGH (ref 11.5–15.5)
Smear Review: NORMAL
WBC Count: 13.1 K/uL — ABNORMAL HIGH (ref 4.0–10.5)
nRBC: 0 % (ref 0.0–0.2)

## 2024-03-15 LAB — IRON AND IRON BINDING CAPACITY (CC-WL,HP ONLY)
Iron: 22 ug/dL — ABNORMAL LOW (ref 28–170)
Saturation Ratios: 5 % — ABNORMAL LOW (ref 10.4–31.8)
TIBC: 456 ug/dL — ABNORMAL HIGH (ref 250–450)
UIBC: 434 ug/dL (ref 148–442)

## 2024-03-15 LAB — CMP (CANCER CENTER ONLY)
ALT: 20 U/L (ref 0–44)
AST: 26 U/L (ref 15–41)
Albumin: 4.5 g/dL (ref 3.5–5.0)
Alkaline Phosphatase: 73 U/L (ref 38–126)
Anion gap: 8 (ref 5–15)
BUN: 10 mg/dL (ref 8–23)
CO2: 27 mmol/L (ref 22–32)
Calcium: 10.5 mg/dL — ABNORMAL HIGH (ref 8.9–10.3)
Chloride: 104 mmol/L (ref 98–111)
Creatinine: 0.81 mg/dL (ref 0.44–1.00)
GFR, Estimated: >60 mL/min (ref >60)
Glucose, Bld: 92 mg/dL (ref 70–99)
Potassium: 5.1 mmol/L (ref 3.5–5.1)
Sodium: 139 mmol/L (ref 135–145)
Total Bilirubin: 0.6 mg/dL (ref 0.0–1.2)
Total Protein: 7.9 g/dL (ref 6.5–8.1)

## 2024-03-15 LAB — RETICULOCYTES
Immature Retic Fract: 22 % — ABNORMAL HIGH (ref 2.3–15.9)
RBC.: 6.46 MIL/uL — ABNORMAL HIGH (ref 3.87–5.11)
Retic Count, Absolute: 103.4 K/uL (ref 19.0–186.0)
Retic Ct Pct: 1.6 % (ref 0.4–3.1)

## 2024-03-15 LAB — LACTATE DEHYDROGENASE: LDH: 150 U/L (ref 98–192)

## 2024-03-15 MED ORDER — HYDROXYUREA 500 MG PO CAPS: 500.0000 mg | ORAL_CAPSULE | ORAL | 4 refills | Status: AC

## 2024-03-15 NOTE — Progress Notes (Signed)
 Hematology and Oncology Follow Up Visit  Katrina Walters [id number] December 25, 1955 68 y.o. 03/15/2024   Principle Diagnosis:  Polycythemia Vera -- JAK2 (+)   Current Therapy:        Phlebotomy to maintain HCT below 45% Aspirin  81 mg PO daily Jakafi - 10 mg BID - started on 12/29/2023 - DC'd by patient on 01/25/2024 d/t intolerance Hydrea  500 mg PO every other day   Interim History:  Ms. Kearse is here today with her husband for follow-up. Her counts have remained stable.  We missed her order for Hydrea  last month. I have reached out to Dr. Timmy to place for 500 mg PO every other day.  She notes persistent fatigue.  Hct is 40% today so no phlebotomy needed.  No abnormal bruising, no petechiae.  No fever, chills, n/v, cough, rash, dizziness, SOB, chest pain, palpitations, abdominal pain or changes in bowel or bladder habits.  Numbness and tingling in her feet unchanged from baseline.  No swelling or tenderness in her extremities.  No falls or syncope reported.  Appetite and hydration are good. Weight is stable at 99 lbs.   ECOG Performance Status: 1 - Symptomatic but completely ambulatory  Medications:  Allergies as of 03/15/2024       Reactions   Amoxicillin     Had fever with flu like symptoms. Has patient had a PCN reaction causing immediate rash, facial/tongue/throat swelling, SOB or lightheadedness with hypotension: no Has patient had a PCN reaction causing severe rash involving mucus membranes or skin necrosis: no Has patient had a PCN reaction that required hospitalization no Has patient had a PCN reaction occurring within the last 10 years: yes If all of the above answers are NO, then may proceed with Cephalosporin use.        Medication List        Accurate as of March 15, 2024 11:06 AM. If you have any questions, ask your nurse or doctor.          aspirin  81 MG chewable tablet Chew by mouth daily.   BENEFIBER DRINK MIX PO Take by mouth daily at 6 (six) AM.    Bismuth  262 MG Chew Chew 524 mg by mouth in the morning, at noon, in the evening, and at bedtime.   CALCIUM  600 PO Take 600 mg by mouth 2 (two) times daily.   cholecalciferol 25 MCG (1000 UNIT) tablet Commonly known as: VITAMIN D3 Take 1,000 Units by mouth daily.   colchicine  0.6 MG tablet Take 1 tablet (0.6 mg total) by mouth 2 (two) times daily.   Cranberry-Vitamin C 250-60 MG Caps Take 2 tablets by mouth daily.   D-MANNOSE PO Take 2 tablets by mouth daily.   diclofenac  75 MG EC tablet Commonly known as: VOLTAREN  Take 1 tablet (75 mg total) by mouth 2 (two) times daily.   diphenhydrAMINE  25 MG tablet Commonly known as: BENADRYL  Take 25 mg by mouth at bedtime.   esomeprazole  40 MG capsule Commonly known as: NexIUM  Take 1 capsule two times daily for 4 weeks, then daily   ezetimibe  10 MG tablet Commonly known as: ZETIA  Take 1 tablet (10 mg total) by mouth daily. Please keep scheduled appointment for additional refills.   famciclovir  500 MG tablet Commonly known as: FAMVIR  Take 1 tablet (500 mg total) by mouth 3 (three) times daily.   FIBER PO Take 2 tablets by mouth daily.   HYDROcodone -acetaminophen  5-325 MG tablet Commonly known as: NORCO/VICODIN Take 1 tablet by mouth every 6 (six) hours as  needed for severe pain (pain score 7-10).   levocetirizine 5 MG tablet Commonly known as: XYZAL  TAKE 1 TABLET BY MOUTH EVERY DAY IN THE EVENING   Lokelma  5 g packet Generic drug: sodium zirconium cyclosilicate  5 GRAM PACK TWICE A WEEK(TUESDAY AND THURSDAY)   mupirocin  ointment 2 % Commonly known as: BACTROBAN  Apply 1 Application topically 2 (two) times daily.   nitazoxanide  500 MG tablet Commonly known as: ALINIA  Take 1 tablet (500 mg total) by mouth 2 (two) times daily with a meal.   NON FORMULARY Take 0.5 tablets by mouth at bedtime. CBD oil - for pain Gummies   Premarin  vaginal cream Generic drug: conjugated estrogens  Place 0.25 Applicatorfuls vaginally  See admin instructions. Insert 0.5 grams vaginally every other night   PRESCRIPTION MEDICATION CBD gummies nightly   progesterone  100 MG capsule Commonly known as: Prometrium  Take 1 capsule (100 mg total) by mouth daily.   Prolia  60 MG/ML Sosy injection Generic drug: denosumab  INJECT 1 SYRINGE UNDER THE SKIN ONCE EVERY 6 MONTHS   rosuvastatin  10 MG tablet Commonly known as: CRESTOR  Take 1 tablet (10 mg total) by mouth daily.   SYSTANE OP Place 1 drop into both eyes in the morning and at bedtime.   triamcinolone  cream 0.1 % Commonly known as: KENALOG  Apply 1 Application topically 2 (two) times daily.   Trulance  3 MG Tabs Generic drug: Plecanatide  Take 1 tablet (3 mg total) by mouth daily.        Allergies:  Allergies  Allergen Reactions   Amoxicillin      Had fever with flu like symptoms. Has patient had a PCN reaction causing immediate rash, facial/tongue/throat swelling, SOB or lightheadedness with hypotension: no Has patient had a PCN reaction causing severe rash involving mucus membranes or skin necrosis: no Has patient had a PCN reaction that required hospitalization no Has patient had a PCN reaction occurring within the last 10 years: yes If all of the above answers are NO, then may proceed with Cephalosporin use.    Past Medical History, Surgical history, Social history, and Family History were reviewed and updated.  Review of Systems: All other 10 point review of systems is negative.   Physical Exam:  height is 5' 1.5 (1.562 m) and weight is 99 lb 1.9 oz (45 kg). Her oral temperature is 98.5 F (36.9 C). Her blood pressure is 119/70 and her pulse is 85. Her respiration is 17 and oxygen saturation is 100%.   Wt Readings from Last 3 Encounters:  03/15/24 99 lb 1.9 oz (45 kg)  03/10/24 101 lb 4 oz (45.9 kg)  02/02/24 100 lb 1.9 oz (45.4 kg)    Ocular: Sclerae unicteric, pupils equal, round and reactive to light Ear-nose-throat: Oropharynx clear,  dentition fair Lymphatic: No cervical or supraclavicular adenopathy Lungs no rales or rhonchi, good excursion bilaterally Heart regular rate and rhythm, no murmur appreciated Abd soft, nontender, positive bowel sounds MSK no focal spinal tenderness, no joint edema Neuro: non-focal, well-oriented, appropriate affect Breasts: Deferred   Lab Results  Component Value Date   WBC 15.9 (H) 02/02/2024   HGB 12.0 02/02/2024   HCT 43.2 02/02/2024   MCV 64.9 (L) 02/02/2024   PLT 591 (H) 02/02/2024   Lab Results  Component Value Date   FERRITIN 9 (L) 02/02/2024   IRON  28 02/02/2024   TIBC 497 (H) 02/02/2024   UIBC 469 (H) 02/02/2024   IRONPCTSAT 6 (L) 02/02/2024   Lab Results  Component Value Date   RETICCTPCT 1.6 03/15/2024  RBC 6.46 (H) 03/15/2024   No results found for: KPAFRELGTCHN, LAMBDASER, KAPLAMBRATIO No results found for: IGGSERUM, IGA, IGMSERUM No results found for: STEPHANY CARLOTA BENSON MARKEL EARLA JOANNIE DOC, MSPIKE, SPEI   Chemistry      Component Value Date/Time   NA 134 (L) 02/02/2024 1054   NA 139 04/15/2021 1027   K 5.0 02/02/2024 1054   CL 99 02/02/2024 1054   CO2 27 02/02/2024 1054   BUN 13 02/02/2024 1054   BUN 14 04/15/2021 1027   CREATININE 0.78 02/02/2024 1054   CREATININE 0.84 07/14/2016 0914      Component Value Date/Time   CALCIUM  10.2 02/02/2024 1054   ALKPHOS 86 02/02/2024 1054   AST 22 02/02/2024 1054   ALT 16 02/02/2024 1054   BILITOT 0.7 02/02/2024 1054      Ms. Geerdes is a very pleasant 68 yo caucasian female with polycythemia vera, JAK 2 positive. She also has IDA.  Iron  studies are pending.  No phlebotomy needed this visit, Hct 40%. Hydrea  500 mg PO every other day.  Continue taking probiotic daily.  Follow-up in 6 weeks.  Impression and Plan:    Lauraine Pepper, NP 7/8/202511:06 AM

## 2024-03-24 ENCOUNTER — Ambulatory Visit

## 2024-03-24 ENCOUNTER — Ambulatory Visit: Payer: Self-pay

## 2024-03-24 DIAGNOSIS — H353122 Nonexudative age-related macular degeneration, left eye, intermediate dry stage: Secondary | ICD-10-CM | POA: Diagnosis not present

## 2024-03-24 DIAGNOSIS — H26492 Other secondary cataract, left eye: Secondary | ICD-10-CM | POA: Diagnosis not present

## 2024-03-24 DIAGNOSIS — H353111 Nonexudative age-related macular degeneration, right eye, early dry stage: Secondary | ICD-10-CM | POA: Diagnosis not present

## 2024-03-25 ENCOUNTER — Other Ambulatory Visit: Payer: Self-pay | Admitting: Medical Genetics

## 2024-03-25 DIAGNOSIS — H353132 Nonexudative age-related macular degeneration, bilateral, intermediate dry stage: Secondary | ICD-10-CM | POA: Diagnosis not present

## 2024-03-28 ENCOUNTER — Other Ambulatory Visit (HOSPITAL_COMMUNITY)
Admission: RE | Admit: 2024-03-28 | Discharge: 2024-03-28 | Disposition: A | Discharge status: Home / Self Care | Payer: Self-pay | Source: Ambulatory Visit | Attending: Medical Genetics | Admitting: Medical Genetics

## 2024-03-31 ENCOUNTER — Telehealth: Payer: Self-pay | Admitting: *Deleted

## 2024-03-31 ENCOUNTER — Ambulatory Visit: Admitting: *Deleted

## 2024-03-31 VITALS — Ht 63.0 in | Wt 99.0 lb

## 2024-03-31 DIAGNOSIS — Z0000 Encounter for general adult medical examination without abnormal findings: Secondary | ICD-10-CM | POA: Diagnosis not present

## 2024-03-31 NOTE — Telephone Encounter (Signed)
 Pt had AWV today.  Recently diagnosed with Polycythemia Vera (notes no energy since this diagnosis). Also reports beginnings of macular degeneration and sees [facility] every 6 months. Pt reported food and housing insecurities today and community resources were provided. Advised pt to let us  know if she is not able to get assistance from these referrals today and she voices understanding.

## 2024-03-31 NOTE — Patient Instructions (Addendum)
 Katrina Walters , Thank you for taking time out of your busy schedule to complete your Annual Wellness Visit with me. I enjoyed our conversation and look forward to speaking with you again next year. I, as well as your care team,  appreciate your ongoing commitment to your health goals. Please review the following plan we discussed and let me know if I can assist you in the future. Your Game plan/ To Do List   Referrals: If you haven't heard from the office you've been referred to, please reach out to them at the phone provided.   Rent/Mortgage and Utility Assistance by [facility] ([facility]) [phone] [employer] by [employer] [phone]  Follow up Visits: Next Medicare AWV with our clinical staff:   04/04/25 1pm  Next Office Visit with your provider: 08/01/24 9am  Clinician Recommendations:  Aim for 30 minutes of exercise or brisk walking, 6-8 glasses of water, and 5 servings of fruits and vegetables each day.       This is a list of the screening recommended for you and due dates:  Health Maintenance  Topic Date Due   COVID-19 Vaccine (2 - Pfizer risk series) 12/24/2019   Medicare Annual Wellness Visit  02/23/2024   Flu Shot  04/08/2024   Mammogram  01/24/2026   Colon Cancer Screening  03/31/2027   DTaP/Tdap/Td vaccine (3 - Td or Tdap) 06/08/2030   Pneumococcal Vaccine for age over 52  Completed   DEXA scan (bone density measurement)  Completed   Hepatitis C Screening  Completed   Zoster (Shingles) Vaccine  Completed   Hepatitis B Vaccine  Aged Out   HPV Vaccine  Aged Out   Meningitis B Vaccine  Aged Out   Please let me know if you haven't received your Advanced Directive packet within 1 week. Once completed, you may return them by either of the following below.  Advanced directives: (Copy Requested) Please bring a copy of your health care power of attorney and living will to the office to be added to your chart at your convenience. You  can mail to [facility] [address] [place], [place] [address] or email to [email or web address] Advance Care Planning is important because it:  [x]  Makes sure you receive the medical care that is consistent with your values, goals, and preferences  [x]  It provides guidance to your family and loved ones and reduces their decisional burden about whether or not they are making the right decisions based on your wishes.  Follow the link provided in your after visit summary or read over the paperwork we have mailed to you to help you started getting your Advance Directives in place. If you need assistance in completing these, please reach out to us  so that we can help you!  See attachments for Preventive Care and Fall Prevention Tips.

## 2024-03-31 NOTE — Progress Notes (Signed)
 Please attest this visit in the absence of patient primary care provider.     Subjective:   Katrina Walters is a 68 y.o. who presents for a Medicare Wellness preventive visit.  As a reminder, Annual Wellness Visits don't include a physical exam, and some assessments may be limited, especially if this visit is performed virtually. We may recommend an in-person follow-up visit with your provider if needed.  Visit Complete: Virtual I connected with  Katrina Walters on 03/31/24 by a audio enabled telemedicine application and verified that I am speaking with the correct person using two identifiers.  Patient Location: Home  Provider Location: Office/Clinic  I discussed the limitations of evaluation and management by telemedicine. The patient expressed understanding and agreed to proceed.  Vital Signs: Because this visit was a virtual/telehealth visit, some criteria may be missing or patient reported. Any vitals not documented were not able to be obtained and vitals that have been documented are patient reported.  VideoDeclined- This patient declined Librarian, academic. Therefore the visit was completed with audio only.  Persons Participating in Visit: Patient.  AWV Questionnaire: Yes: Patient Medicare AWV questionnaire was completed by the patient on 03/26/24; I have confirmed that all information answered by patient is correct and no changes since this date.  Cardiac Risk Factors include: advanced age (>55men, >30 women);dyslipidemia;Other (see comment), Risk factor comments: CAD, polycythemia vera     Objective:    Today's Vitals   03/26/24 0857 03/31/24 1300  Weight:  99 lb (44.9 kg)  Height:  5' 3 (1.6 m)  PainSc: 0-No pain    Body mass index is 17.54 kg/m.     03/31/2024    1:45 PM 03/15/2024   10:55 AM 02/02/2024   12:03 PM 12/29/2023   10:52 AM 10/27/2023   10:35 AM 10/13/2023    9:31 AM 07/13/2023    9:54 AM  Advanced Directives  Does Patient Have a  Medical Advance Directive? No No No No No No No  Would patient like information on creating a medical advance directive? Yes (MAU/Ambulatory/Procedural Areas - Information given) No - Patient declined No - Patient declined No - Patient declined No - Patient declined  No - Patient declined    Current Medications (verified) Outpatient Encounter Medications as of 03/31/2024  Medication Sig   aspirin  81 MG chewable tablet Chew by mouth daily.   Bismuth  262 MG CHEW Chew 524 mg by mouth in the morning, at noon, in the evening, and at bedtime.   Calcium  Carbonate (CALCIUM  600 PO) Take 600 mg by mouth 2 (two) times daily.   cholecalciferol (VITAMIN D3) 25 MCG (1000 UNIT) tablet Take 1,000 Units by mouth daily.   colchicine  0.6 MG tablet Take 1 tablet (0.6 mg total) by mouth 2 (two) times daily.   Cranberry-Vitamin C 250-60 MG CAPS Take 2 tablets by mouth daily.   D-MANNOSE PO Take 2 tablets by mouth daily.   denosumab  (PROLIA ) 60 MG/ML SOSY injection INJECT 1 SYRINGE UNDER THE SKIN ONCE EVERY 6 MONTHS   diclofenac  (VOLTAREN ) 75 MG EC tablet Take 1 tablet (75 mg total) by mouth 2 (two) times daily.   diphenhydrAMINE  (BENADRYL ) 25 MG tablet Take 25 mg by mouth at bedtime.   esomeprazole  (NEXIUM ) 40 MG capsule Take 1 capsule two times daily for 4 weeks, then daily   ezetimibe  (ZETIA ) 10 MG tablet Take 1 tablet (10 mg total) by mouth daily. Please keep scheduled appointment for additional refills.   FIBER PO  Take 2 tablets by mouth daily.   hydroxyurea  (HYDREA ) 500 MG capsule Take 1 capsule (500 mg total) by mouth every other day. May take with food to minimize GI side effects.   levocetirizine (XYZAL ) 5 MG tablet TAKE 1 TABLET BY MOUTH EVERY DAY IN THE EVENING   LOKELMA  5 g packet 5 GRAM PACK TWICE A WEEK(TUESDAY AND THURSDAY)   mupirocin  ointment (BACTROBAN ) 2 % Apply 1 Application topically 2 (two) times daily.   nitazoxanide  (ALINIA ) 500 MG tablet Take 1 tablet (500 mg total) by mouth 2 (two) times  daily with a meal.   NON FORMULARY Take 0.5 tablets by mouth at bedtime. CBD oil - for pain Gummies   Plecanatide  (TRULANCE ) 3 MG TABS Take 1 tablet (3 mg total) by mouth daily.   Polyethyl Glycol-Propyl Glycol (SYSTANE OP) Place 1 drop into both eyes in the morning and at bedtime.   PREMARIN  vaginal cream Place 0.25 Applicatorfuls vaginally See admin instructions. Insert 0.5 grams vaginally every other night   PRESCRIPTION MEDICATION CBD gummies nightly   rosuvastatin  (CRESTOR ) 10 MG tablet Take 1 tablet (10 mg total) by mouth daily.   triamcinolone  cream (KENALOG ) 0.1 % Apply 1 Application topically 2 (two) times daily.   Wheat Dextrin (BENEFIBER DRINK MIX PO) Take by mouth daily at 6 (six) AM.   HYDROcodone -acetaminophen  (NORCO/VICODIN) 5-325 MG tablet Take 1 tablet by mouth every 6 (six) hours as needed for severe pain (pain score 7-10). (Patient not taking: Reported on 03/31/2024)   [DISCONTINUED] famciclovir  (FAMVIR ) 500 MG tablet Take 1 tablet (500 mg total) by mouth 3 (three) times daily. (Patient not taking: Reported on 02/02/2024)   [DISCONTINUED] progesterone  (PROMETRIUM ) 100 MG capsule Take 1 capsule (100 mg total) by mouth daily.   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    Osteoporosis 09/2016   Peripheral arterial disease (HCC)    Polycythemia vera (HCC)    PONV (postoperative nausea and vomiting)    Past Surgical History:  Procedure Laterality Date   ANAL RECTAL MANOMETRY N/A  11/12/2020   Procedure: ANO RECTAL MANOMETRY;  Surgeon: San Sandor GAILS, DO;  Location: [facility]COPY;  Service: Gastroenterology;  Laterality: N/A;   BREAST REDUCTION SURGERY  2006   CARDIAC CATHETERIZATION N/A 07/17/2016   Procedure: Left Heart Cath and Coronary Angiography;  Surgeon: Lonni JONETTA Cash, MD;  Location: [facility] INVASIVE CV LAB;  Service: Cardiovascular;  Laterality: N/A;   CATARACT EXTRACTION W/ INTRAOCULAR LENS IMPLANT Bilateral    COLONOSCOPY  2015   CORONARY ARTERY BYPASS GRAFT N/A 06/04/2021   Procedure: CORONARY ARTERY BYPASS GRAFTING (CABG), ON PUMP, TIMES FOUR, USING LEFT INTERNAL MAMMARY ARTERY, LEFT RADIAL ARTERY (HARVESTED OPEN), AND RIGHT GREATER SAPHENOUS VEIN (HARVESTED ENDOSCOPICALLY);  Surgeon: Shyrl Linnie KIDD, MD;  Location: [facility] OR;  Service: Open Heart Surgery;  Laterality: N/A;   EYE SURGERY     LEFT HEART CATH AND CORONARY ANGIOGRAPHY N/A 04/22/2021   Procedure: LEFT  HEART CATH AND CORONARY ANGIOGRAPHY;  Surgeon: Claudene Victory ORN, MD;  Location: [facility] INVASIVE CV LAB;  Service: Cardiovascular;  Laterality: N/A;   POLYPECTOMY  2015   RADIAL ARTERY HARVEST Left 06/04/2021   Procedure: RADIAL ARTERY HARVEST;  Surgeon: Shyrl Linnie KIDD, MD;  Location: [facility] OR;  Service: Open Heart Surgery;  Laterality: Left;   TEE WITHOUT CARDIOVERSION N/A 06/04/2021   Procedure: TRANSESOPHAGEAL ECHOCARDIOGRAM (TEE);  Surgeon: Shyrl Linnie KIDD, MD;  Location: [facility];  Service: Open Heart Surgery;  Laterality: N/A;   TONSILLECTOMY     VAGINAL HYSTERECTOMY  08/2019   w/ apical suspension, A&P repair 08/2019 at [facility]   Family History  Problem Relation Age of Onset   Heart disease Mother        died at 73 of heart attack   Hypertension Mother    Heart attack Mother    Heart disease Father        had heart attack at age 73   Heart attack Father    Rectal cancer Father 7   Hearing loss Father    Cancer Paternal Aunt    Stomach cancer Paternal Grandmother    Colon  cancer Neg Hx    Colon polyps Neg Hx    Esophageal cancer Neg Hx    Social History   Socioeconomic History   Marital status: Married    Spouse name: Not on file   Number of children: 1   Years of education: Not on file   Highest education level: 12th grade  Occupational History   Occupation: unemployed at the time  Tobacco Use   Smoking status: Never   Smokeless tobacco: Never  Vaping Use   Vaping status: Never Used  Substance and Sexual Activity   Alcohol use: Yes    Alcohol/week: 1.0 standard drink of alcohol    Types: 1 Glasses of wine per week    Comment: occassionally   Drug use: No    Comment: CBD gummies   Sexual activity: Yes    Birth control/protection: Post-menopausal  Other Topics Concern   Not on file  Social History Narrative   Not on file   Social Drivers of Health   Financial Resource Strain: Medium Risk (03/26/2024)   Overall Financial Resource Strain (CARDIA)    Difficulty of Paying Living Expenses: Somewhat hard  Food Insecurity: Food Insecurity Present (03/26/2024)   Hunger Vital Sign    Worried About Running Out of Food in the Last Year: Sometimes true    Ran Out of Food in the Last Year: Never true  Transportation Needs: No Transportation Needs (03/26/2024)   PRAPARE - Administrator, Civil Service (Medical): No    Lack of Transportation (Non-Medical): No  Physical Activity: Insufficiently Active (03/26/2024)   Exercise Vital Sign    Days of Exercise per Week: 2 days    Minutes of Exercise per Session: 20 min  Stress: No Stress Concern Present (03/26/2024)   [employer] of Occupational Health - Occupational Stress Questionnaire    Feeling of Stress: Only a little  Social Connections: Moderately Isolated (03/26/2024)   Social Connection and Isolation Panel    Frequency of Communication with Friends and Family: More than three times a week    Frequency of Social Gatherings with Friends and Family: Once a week    Attends Religious  Services: Never    Database administrator or Organizations: No    Attends Banker Meetings: Not on file  Marital Status: Married    Tobacco Counseling Counseling given: Not Answered    Clinical Intake:  Pre-visit preparation completed: Yes  Pain : No/denies pain Pain Score: 0-No pain     BMI - recorded: 17.54 Nutritional Status: BMI <19  Underweight Nutritional Risks: None Diabetes: No  Lab Results  Component Value Date   HGBA1C 5.2 11/23/2023   HGBA1C 6.0 05/14/2023   HGBA1C 5.5 07/14/2022    How often do you need to have someone help you when you read instructions, pamphlets, or other written materials from your doctor or pharmacy?: 1 - Never What is the last grade level you completed in school?: 12th grade Interpreter Needed?: No Information entered by :: Lolita Libra, CMA Activities of Daily Living     03/26/2024    8:57 AM  In your present state of health, do you have any difficulty performing the following activities:  Hearing? 1  Comment has bilateral hearing aids  Vision? 0  Difficulty concentrating or making decisions? 0  Walking or climbing stairs? 0  Dressing or bathing? 0  Doing errands, shopping? 0  Preparing Food and eating ? N  Using the Toilet? N  In the past six months, have you accidently leaked urine? N  Do you have problems with loss of bowel control? N  Managing your Medications? N  Managing your Finances? N  Housekeeping or managing your Housekeeping? N    Patient Care Team: Saguier, Edward, PA-C as PCP - General (Physician Assistant) O'Neal, Darryle Ned, MD as PCP - Cardiology (Cardiology) [facility] (Ophthalmology)  I have updated your Care Teams any recent Medical Services you may have received from other providers in the past year.     Assessment:   This is a routine wellness examination for Katrina Walters.  Hearing/Vision screen Hearing Screening - Comments:: Has bilateral hearing aids. Vision  Screening - Comments:: Last eye exam with    Goals Addressed   None    Depression Screen     03/31/2024    1:20 PM 07/30/2023   11:32 AM 05/14/2023   10:51 AM 02/23/2023   11:08 AM 12/24/2021    2:25 PM 02/01/2021   11:05 AM 12/22/2016   10:06 AM  PHQ 2/9 Scores  PHQ - 2 Score 0 0 0 0 0 0 0  PHQ- 9 Score 7 2 0    6    Fall Risk     03/26/2024    8:57 AM 05/14/2023   10:51 AM 02/22/2023   11:37 AM 12/24/2021    2:34 PM 12/23/2021    3:04 PM  Fall Risk   Falls in the past year? 0 0 0 0 0  Number falls in past yr: 0 0 0 0   Injury with Fall? 0 0 0 0   Risk for fall due to : Impaired balance/gait No Fall Risks No Fall Risks No Fall Risks   Follow up Falls evaluation completed Falls evaluation completed Falls evaluation completed      MEDICARE RISK AT HOME:  Medicare Risk at Home Any stairs in or around the home?: (Patient-Rptd) No Home free of loose throw rugs in walkways, pet beds, electrical cords, etc?: (Patient-Rptd) Yes Adequate lighting in your home to reduce risk of falls?: (Patient-Rptd) Yes Life alert?: (Patient-Rptd) No Use of a cane, walker or w/c?: (Patient-Rptd) No Grab bars in the bathroom?: (Patient-Rptd) No Shower chair or bench in shower?: (Patient-Rptd) Yes Elevated toilet seat or a handicapped toilet?: (Patient-Rptd) No  TIMED UP  AND GO:  Was the test performed?  No,audio  Cognitive Function: 6CIT completed        03/31/2024    1:45 PM 02/23/2023   11:11 AM 12/24/2021    2:36 PM  6CIT Screen  What Year? 0 points 0 points 0 points  What month? 0 points 0 points 0 points  What time? 0 points 0 points 0 points  Count back from 20 0 points 0 points 0 points  Months in reverse 0 points 0 points 0 points  Repeat phrase 0 points 0 points 0 points  Total Score 0 points 0 points 0 points    Immunizations Immunization History  Administered Date(s) Administered   Influenza-Unspecified 06/22/2022, 07/04/2023   PFIZER(Purple Top)SARS-COV-2 Vaccination  12/03/2019   PNEUMOCOCCAL CONJUGATE-20 06/18/2022   Pneumococcal-Unspecified 06/22/2022   Respiratory Syncytial Virus Vaccine,Recomb Aduvanted(Arexvy) 06/22/2022   Td 07/12/2014   Tdap 06/08/2020   Zoster Recombinant(Shingrix) 04/25/2020, 03/22/2021    Screening Tests Health Maintenance  Topic Date Due   COVID-19 Vaccine (2 - Pfizer risk series) 12/24/2019   Medicare Annual Wellness (AWV)  02/23/2024   INFLUENZA VACCINE  04/08/2024   MAMMOGRAM  01/24/2026   Colonoscopy  03/31/2027   DTaP/Tdap/Td (3 - Td or Tdap) 06/08/2030   Pneumococcal Vaccine: 50+ Years  Completed   DEXA SCAN  Completed   Hepatitis C Screening  Completed   Zoster Vaccines- Shingrix  Completed   Hepatitis B Vaccines  Aged Out   HPV VACCINES  Aged Out   Meningococcal B Vaccine  Aged Out    Health Maintenance  Health Maintenance Due  Topic Date Due   COVID-19 Vaccine (2 - Pfizer risk series) 12/24/2019   Medicare Annual Wellness (AWV)  02/23/2024   Health Maintenance Items Addressed: All HM up to date  Additional Screening:  Vision Screening: Recommended annual ophthalmology exams for early detection of glaucoma and other disorders of the eye. Would you like a referral to an eye doctor? No    Dental Screening: Recommended annual dental exams for proper oral hygiene  Community Resource Referral / Chronic Care Management: CRR required this visit?  Find Help resources provided to pt  CCM required this visit?  No   Plan:    I have personally reviewed and noted the following in the patient's chart:   Medical and social history Use of alcohol, tobacco or illicit drugs  Current medications and supplements including opioid prescriptions. Patient is not currently taking opioid prescriptions. Functional ability and status Nutritional status Physical activity Advanced directives List of other physicians Hospitalizations, surgeries, and ER visits in previous 12 months Vitals Screenings to include  cognitive, depression, and falls Referrals and appointments  In addition, I have reviewed and discussed with patient certain preventive protocols, quality metrics, and best practice recommendations. A written personalized care plan for preventive services as well as general preventive health recommendations were provided to patient.   Lolita Libra, CMA   03/31/2024   After Visit Summary: (MyChart) Due to this being a telephonic visit, the after visit summary with patients personalized plan was offered to patient via MyChart   Notes: See phone note

## 2024-04-08 LAB — GENECONNECT MOLECULAR SCREEN: Genetic Analysis Overall Interpretation: NEGATIVE

## 2024-04-14 ENCOUNTER — Telehealth: Payer: Self-pay

## 2024-04-14 NOTE — Telephone Encounter (Unsigned)
 Copied from CRM #[id number]. Topic: General - Other >> Apr 14, 2024  2:21 PM Jasmin G wrote: Reason for CRM: Pt called regarding recent phone call, I relayed message left by Mr. Curtistine Quiet today at 11:03 a.m but could not find any info on what referral was talked about and pt was unsure as well as she's an elder and goes to the doctor every week, please message her through MyChart for further clarification.

## 2024-04-14 NOTE — Telephone Encounter (Signed)
 Left voicemail to call office back to see if referral was completed.   Curtistine Quiet, CMA

## 2024-04-24 DIAGNOSIS — H353132 Nonexudative age-related macular degeneration, bilateral, intermediate dry stage: Secondary | ICD-10-CM | POA: Diagnosis not present

## 2024-04-26 ENCOUNTER — Inpatient Hospital Stay: Attending: Hematology & Oncology

## 2024-04-26 ENCOUNTER — Inpatient Hospital Stay (HOSPITAL_BASED_OUTPATIENT_CLINIC_OR_DEPARTMENT_OTHER): Admitting: Family

## 2024-04-26 VITALS — BP 113/66 | HR 81 | Temp 98.4°F | Resp 18 | Ht 61.5 in | Wt 101.1 lb

## 2024-04-26 DIAGNOSIS — R7989 Other specified abnormal findings of blood chemistry: Secondary | ICD-10-CM

## 2024-04-26 DIAGNOSIS — D509 Iron deficiency anemia, unspecified: Secondary | ICD-10-CM

## 2024-04-26 DIAGNOSIS — D45 Polycythemia vera: Secondary | ICD-10-CM

## 2024-04-26 LAB — CMP (CANCER CENTER ONLY)
ALT: 50 U/L — ABNORMAL HIGH (ref 0–44)
AST: 78 U/L — ABNORMAL HIGH (ref 15–41)
Albumin: 4.5 g/dL (ref 3.5–5.0)
Alkaline Phosphatase: 87 U/L (ref 38–126)
Anion gap: 12 (ref 5–15)
BUN: 11 mg/dL (ref 8–23)
CO2: 24 mmol/L (ref 22–32)
Calcium: 9.7 mg/dL (ref 8.9–10.3)
Chloride: 99 mmol/L (ref 98–111)
Creatinine: 0.79 mg/dL (ref 0.44–1.00)
GFR, Estimated: >60 mL/min (ref >60)
Glucose, Bld: 103 mg/dL — ABNORMAL HIGH (ref 70–99)
Potassium: 5.4 mmol/L — ABNORMAL HIGH (ref 3.5–5.1)
Sodium: 135 mmol/L (ref 135–145)
Total Bilirubin: 0.6 mg/dL (ref 0.0–1.2)
Total Protein: 8 g/dL (ref 6.5–8.1)

## 2024-04-26 LAB — IRON AND IRON BINDING CAPACITY (CC-WL,HP ONLY)
Iron: 28 ug/dL (ref 28–170)
Saturation Ratios: 6 % — ABNORMAL LOW (ref 10.4–31.8)
TIBC: 449 ug/dL (ref 250–450)
UIBC: 421 ug/dL

## 2024-04-26 LAB — RETICULOCYTES
Immature Retic Fract: 19.6 % — ABNORMAL HIGH (ref 2.3–15.9)
RBC.: 6.48 MIL/uL — ABNORMAL HIGH (ref 3.87–5.11)
Retic Count, Absolute: 97.8 K/uL (ref 19.0–186.0)
Retic Ct Pct: 1.5 % (ref 0.4–3.1)

## 2024-04-26 LAB — LACTATE DEHYDROGENASE: LDH: 199 U/L — ABNORMAL HIGH (ref 98–192)

## 2024-04-26 LAB — FERRITIN: Ferritin: 10 ng/mL — ABNORMAL LOW (ref 11–307)

## 2024-04-26 NOTE — Progress Notes (Unsigned)
 Hematology and Oncology Follow Up Visit  Amylynn Fano [id number] 02-12-1956 68 y.o. 04/26/2024   Principle Diagnosis:  Polycythemia Vera -- JAK2 (+)   Current Therapy:        Phlebotomy to maintain HCT below 45% Aspirin  81 mg PO daily Jakafi - 10 mg BID - started on 12/29/2023 - DC'd by patient on 01/25/2024 d/t intolerance Hydrea  500 mg PO every other day   Interim History:  Ms. Kosar is here today for follow-up. She has had itching all over that started several days ago. She stopped the Hydrea  and this has subsided in the last 24 hours. She states that she had a few red bumps come out on the arms but these have resolved.  She has fatigue, SOB with exertion, occasional palpitations, abdominal bloating and intermittent hot flashes.  No blood loss, bruising or petechiae.  She notes numbness and tingling in her feet/toes.  No swelling in her extremities.  No falls or syncope reported.  No fever, chills, n/v, cough, chest pain, abdominal pain or changes in bowel or bladder habits.   Appetite and hydration are good. Weight is stable at 101 lbs.   ECOG Performance Status: 1 - Symptomatic but completely ambulatory  Medications:  Allergies as of 04/26/2024       Reactions   Amoxicillin     Had fever with flu like symptoms. Has patient had a PCN reaction causing immediate rash, facial/tongue/throat swelling, SOB or lightheadedness with hypotension: no Has patient had a PCN reaction causing severe rash involving mucus membranes or skin necrosis: no Has patient had a PCN reaction that required hospitalization no Has patient had a PCN reaction occurring within the last 10 years: yes If all of the above answers are NO, then may proceed with Cephalosporin use.        Medication List        Accurate as of April 26, 2024 11:08 AM. If you have any questions, ask your nurse or doctor.          aspirin  81 MG chewable tablet Chew by mouth daily.   BENEFIBER DRINK MIX PO Take by  mouth daily at 6 (six) AM.   Bismuth  262 MG Chew Chew 524 mg by mouth in the morning, at noon, in the evening, and at bedtime.   CALCIUM  600 PO Take 600 mg by mouth 2 (two) times daily.   cholecalciferol 25 MCG (1000 UNIT) tablet Commonly known as: VITAMIN D3 Take 1,000 Units by mouth daily.   colchicine  0.6 MG tablet Take 1 tablet (0.6 mg total) by mouth 2 (two) times daily.   Cranberry-Vitamin C 250-60 MG Caps Take 2 tablets by mouth daily.   D-MANNOSE PO Take 2 tablets by mouth daily.   diclofenac  75 MG EC tablet Commonly known as: VOLTAREN  Take 1 tablet (75 mg total) by mouth 2 (two) times daily.   diphenhydrAMINE  25 MG tablet Commonly known as: BENADRYL  Take 25 mg by mouth at bedtime.   esomeprazole  40 MG capsule Commonly known as: NexIUM  Take 1 capsule two times daily for 4 weeks, then daily   ezetimibe  10 MG tablet Commonly known as: ZETIA  Take 1 tablet (10 mg total) by mouth daily. Please keep scheduled appointment for additional refills.   FIBER PO Take 2 tablets by mouth daily.   HYDROcodone -acetaminophen  5-325 MG tablet Commonly known as: NORCO/VICODIN Take 1 tablet by mouth every 6 (six) hours as needed for severe pain (pain score 7-10).   hydroxyurea  500 MG capsule Commonly known as: HYDREA   Take 1 capsule (500 mg total) by mouth every other day. May take with food to minimize GI side effects.   levocetirizine 5 MG tablet Commonly known as: XYZAL  TAKE 1 TABLET BY MOUTH EVERY DAY IN THE EVENING   Lokelma  5 g packet Generic drug: sodium zirconium cyclosilicate  5 GRAM PACK TWICE A WEEK(TUESDAY AND THURSDAY)   mupirocin  ointment 2 % Commonly known as: BACTROBAN  Apply 1 Application topically 2 (two) times daily.   nitazoxanide  500 MG tablet Commonly known as: ALINIA  Take 1 tablet (500 mg total) by mouth 2 (two) times daily with a meal.   NON FORMULARY Take 0.5 tablets by mouth at bedtime. CBD oil - for pain Gummies   Premarin  vaginal  cream Generic drug: conjugated estrogens  Place 0.25 Applicatorfuls vaginally See admin instructions. Insert 0.5 grams vaginally every other night   PRESCRIPTION MEDICATION CBD gummies nightly   Prolia  60 MG/ML Sosy injection Generic drug: denosumab  INJECT 1 SYRINGE UNDER THE SKIN ONCE EVERY 6 MONTHS   rosuvastatin  10 MG tablet Commonly known as: CRESTOR  Take 1 tablet (10 mg total) by mouth daily.   SYSTANE OP Place 1 drop into both eyes in the morning and at bedtime.   triamcinolone  cream 0.1 % Commonly known as: KENALOG  Apply 1 Application topically 2 (two) times daily.   Trulance  3 MG Tabs Generic drug: Plecanatide  Take 1 tablet (3 mg total) by mouth daily.        Allergies:  Allergies  Allergen Reactions   Amoxicillin      Had fever with flu like symptoms. Has patient had a PCN reaction causing immediate rash, facial/tongue/throat swelling, SOB or lightheadedness with hypotension: no Has patient had a PCN reaction causing severe rash involving mucus membranes or skin necrosis: no Has patient had a PCN reaction that required hospitalization no Has patient had a PCN reaction occurring within the last 10 years: yes If all of the above answers are NO, then may proceed with Cephalosporin use.    Past Medical History, Surgical history, Social history, and Family History were reviewed and updated.  Review of Systems: All other 10 point review of systems is negative.   Physical Exam:  height is 5' 1.5 (1.562 m) and weight is 101 lb 1.3 oz (45.8 kg). Her oral temperature is 98.4 F (36.9 C). Her blood pressure is 113/66 and her pulse is 81. Her respiration is 18 and oxygen saturation is 100%.   Wt Readings from Last 3 Encounters:  04/26/24 101 lb 1.3 oz (45.8 kg)  03/31/24 99 lb (44.9 kg)  03/15/24 99 lb 1.9 oz (45 kg)    Ocular: Sclerae unicteric, pupils equal, round and reactive to light Ear-nose-throat: Oropharynx clear, dentition fair Lymphatic: No cervical  or supraclavicular adenopathy Lungs no rales or rhonchi, good excursion bilaterally Heart regular rate and rhythm, no murmur appreciated Abd soft, nontender, positive bowel sounds, no liver or spleen tip palpated on exam, no fluid wave MSK no focal spinal tenderness, no joint edema Neuro: non-focal, well-oriented, appropriate affect Breasts: Deferred   Lab Results  Component Value Date   WBC 13.1 (H) 04/26/2024   HGB 11.6 (L) 04/26/2024   HCT 41.9 04/26/2024   MCV 66.0 (L) 04/26/2024   PLT 513 (H) 04/26/2024   Lab Results  Component Value Date   FERRITIN 9 (L) 03/15/2024   IRON  22 (L) 03/15/2024   TIBC 456 (H) 03/15/2024   UIBC 434 03/15/2024   IRONPCTSAT 5 (L) 03/15/2024   Lab Results  Component Value Date  RETICCTPCT 1.5 04/26/2024   RBC 6.48 (H) 04/26/2024   RBC 6.35 (H) 04/26/2024   No results found for: KPAFRELGTCHN, LAMBDASER, KAPLAMBRATIO No results found for: IGGSERUM, IGA, IGMSERUM No results found for: STEPHANY CARLOTA BENSON MARKEL EARLA JOANNIE DOC VICK, SPEI   Chemistry      Component Value Date/Time   NA 135 04/26/2024 0952   NA 139 04/15/2021 1027   K 5.4 (H) 04/26/2024 0952   CL 99 04/26/2024 0952   CO2 24 04/26/2024 0952   BUN 11 04/26/2024 0952   BUN 14 04/15/2021 1027   CREATININE 0.79 04/26/2024 0952   CREATININE 0.84 07/14/2016 0914      Component Value Date/Time   CALCIUM  9.7 04/26/2024 0952   ALKPHOS 87 04/26/2024 0952   AST 78 (H) 04/26/2024 0952   ALT 50 (H) 04/26/2024 0952   BILITOT 0.6 04/26/2024 0952       Impression and Plan: Ms. Schicker is a very pleasant 68 yo caucasian female with polycythemia vera, JAK 2 positive. She also has IDA secondary to routine phlebotomy.  Iron  studies are pending.  No phlebotomy needed this visit, Hct 41.9%. We will have her continue to hold her Hydrea  as well as the Crestor .   Follow-up in 3 weeks.   Lauraine Pepper, NP 8/19/202511:08 AM

## 2024-04-27 ENCOUNTER — Encounter: Payer: Self-pay | Admitting: Family

## 2024-04-27 ENCOUNTER — Encounter: Payer: Self-pay | Admitting: Cardiovascular Disease

## 2024-04-27 ENCOUNTER — Encounter: Payer: Self-pay | Admitting: *Deleted

## 2024-04-27 LAB — CBC WITH DIFFERENTIAL (CANCER CENTER ONLY)
Abs Immature Granulocytes: 0.13 K/uL — ABNORMAL HIGH (ref 0.00–0.07)
Band Neutrophils: 0 %
Basophils Absolute: 0.1 K/uL (ref 0.0–0.1)
Basophils Relative: 1 %
Blasts: 0 %
Eosinophils Absolute: 0.3 K/uL (ref 0.0–0.5)
Eosinophils Relative: 2 %
HCT: 41.9 % (ref 36.0–46.0)
Hemoglobin: 11.6 g/dL — ABNORMAL LOW (ref 12.0–15.0)
Immature Granulocytes: 1 %
Lymphocytes Relative: 14 %
Lymphs Abs: 1.8 K/uL (ref 0.7–4.0)
MCH: 18.3 pg — ABNORMAL LOW (ref 26.0–34.0)
MCHC: 27.7 g/dL — ABNORMAL LOW (ref 30.0–36.0)
MCV: 66 fL — ABNORMAL LOW (ref 80.0–100.0)
Metamyelocytes Relative: 0 %
Monocytes Absolute: 0.8 K/uL (ref 0.1–1.0)
Monocytes Relative: 6 %
Myelocytes: 0 %
Neutro Abs: 10 K/uL — ABNORMAL HIGH (ref 1.7–7.7)
Neutrophils Relative %: 76 %
Other: 0 %
Platelet Count: 513 K/uL — ABNORMAL HIGH (ref 150–400)
Promyelocytes Relative: 0 %
RBC: 6.35 MIL/uL — ABNORMAL HIGH (ref 3.87–5.11)
RDW: 25.7 % — ABNORMAL HIGH (ref 11.5–15.5)
Smear Review: NORMAL
WBC Count: 13.1 K/uL — ABNORMAL HIGH (ref 4.0–10.5)
nRBC: 0 % (ref 0.0–0.2)
nRBC: 0 /100{WBCs}

## 2024-04-28 ENCOUNTER — Other Ambulatory Visit: Payer: Self-pay

## 2024-04-28 NOTE — Telephone Encounter (Signed)
 Medication list updated.

## 2024-04-28 NOTE — Progress Notes (Signed)
 Crestor  removed from list for the time being,. Can be added back later per Dr. Barbaraann.

## 2024-05-06 ENCOUNTER — Encounter: Payer: Self-pay | Admitting: Medical

## 2024-05-10 MED ORDER — JUBBONTI 60 MG/ML ~~LOC~~ SOSY: 60.0000 mg | PREFILLED_SYRINGE | SUBCUTANEOUS | 1 refills | Status: DC

## 2024-05-10 NOTE — Addendum Note (Signed)
 Addended by: DORINA DALLAS HERO on: 05/10/2024 04:36 PM   Modules accepted: Orders

## 2024-05-10 NOTE — Telephone Encounter (Signed)
 Just place a order for jubbonti . Pt scheduled to get injection December. Per pt her insurance won't covere prolia . Not sure who to forward this to for pt to be scheduled.

## 2024-05-11 ENCOUNTER — Telehealth: Payer: Self-pay | Admitting: *Deleted

## 2024-05-11 NOTE — Telephone Encounter (Signed)
 Error

## 2024-05-12 ENCOUNTER — Encounter: Payer: Self-pay | Admitting: Gastroenterology

## 2024-05-13 ENCOUNTER — Other Ambulatory Visit: Payer: Self-pay

## 2024-05-13 MED ORDER — LINACLOTIDE 290 MCG PO CAPS: 290.0000 ug | ORAL_CAPSULE | Freq: Every day | ORAL | 5 refills | Status: AC

## 2024-05-18 ENCOUNTER — Encounter: Payer: Self-pay | Admitting: Cardiovascular Disease

## 2024-05-18 ENCOUNTER — Encounter: Payer: Self-pay | Admitting: Family

## 2024-05-18 ENCOUNTER — Inpatient Hospital Stay: Attending: Hematology & Oncology

## 2024-05-18 ENCOUNTER — Inpatient Hospital Stay

## 2024-05-18 ENCOUNTER — Inpatient Hospital Stay (HOSPITAL_BASED_OUTPATIENT_CLINIC_OR_DEPARTMENT_OTHER): Admitting: Family

## 2024-05-18 ENCOUNTER — Other Ambulatory Visit: Payer: Self-pay

## 2024-05-18 VITALS — BP 125/66 | HR 95 | Temp 98.3°F | Resp 18 | Wt 99.1 lb

## 2024-05-18 DIAGNOSIS — F458 Other somatoform disorders: Secondary | ICD-10-CM | POA: Diagnosis not present

## 2024-05-18 DIAGNOSIS — D45 Polycythemia vera: Secondary | ICD-10-CM | POA: Diagnosis not present

## 2024-05-18 DIAGNOSIS — R7989 Other specified abnormal findings of blood chemistry: Secondary | ICD-10-CM

## 2024-05-18 DIAGNOSIS — D509 Iron deficiency anemia, unspecified: Secondary | ICD-10-CM

## 2024-05-18 DIAGNOSIS — E782 Mixed hyperlipidemia: Secondary | ICD-10-CM

## 2024-05-18 LAB — CBC WITH DIFFERENTIAL (CANCER CENTER ONLY)
Abs Immature Granulocytes: 0.08 K/uL — ABNORMAL HIGH (ref 0.00–0.07)
Basophils Absolute: 0.1 K/uL (ref 0.0–0.1)
Basophils Relative: 0 %
Eosinophils Absolute: 0.3 K/uL (ref 0.0–0.5)
Eosinophils Relative: 2 %
HCT: 40.8 % (ref 36.0–46.0)
Hemoglobin: 11.5 g/dL — ABNORMAL LOW (ref 12.0–15.0)
Immature Granulocytes: 1 %
Lymphocytes Relative: 11 %
Lymphs Abs: 1.8 K/uL (ref 0.7–4.0)
MCH: 18.3 pg — ABNORMAL LOW (ref 26.0–34.0)
MCHC: 28.2 g/dL — ABNORMAL LOW (ref 30.0–36.0)
MCV: 64.8 fL — ABNORMAL LOW (ref 80.0–100.0)
Monocytes Absolute: 0.6 K/uL (ref 0.1–1.0)
Monocytes Relative: 4 %
Neutro Abs: 13.9 K/uL — ABNORMAL HIGH (ref 1.7–7.7)
Neutrophils Relative %: 82 %
Platelet Count: 556 K/uL — ABNORMAL HIGH (ref 150–400)
RBC: 6.3 MIL/uL — ABNORMAL HIGH (ref 3.87–5.11)
RDW: 24.6 % — ABNORMAL HIGH (ref 11.5–15.5)
WBC Count: 16.8 K/uL — ABNORMAL HIGH (ref 4.0–10.5)
nRBC: 0 % (ref 0.0–0.2)

## 2024-05-18 LAB — CMP (CANCER CENTER ONLY)
ALT: 12 U/L (ref 0–44)
AST: 24 U/L (ref 15–41)
Albumin: 4.4 g/dL (ref 3.5–5.0)
Alkaline Phosphatase: 84 U/L (ref 38–126)
Anion gap: 11 (ref 5–15)
BUN: 13 mg/dL (ref 8–23)
CO2: 26 mmol/L (ref 22–32)
Calcium: 10.3 mg/dL (ref 8.9–10.3)
Chloride: 101 mmol/L (ref 98–111)
Creatinine: 0.81 mg/dL (ref 0.44–1.00)
GFR, Estimated: >60 mL/min (ref >60)
Glucose, Bld: 126 mg/dL — ABNORMAL HIGH (ref 70–99)
Potassium: 5.1 mmol/L (ref 3.5–5.1)
Sodium: 139 mmol/L (ref 135–145)
Total Bilirubin: 0.5 mg/dL (ref 0.0–1.2)
Total Protein: 7.7 g/dL (ref 6.5–8.1)

## 2024-05-18 LAB — IRON AND IRON BINDING CAPACITY (CC-WL,HP ONLY)
Iron: 23 ug/dL — ABNORMAL LOW (ref 28–170)
Saturation Ratios: 5 % — ABNORMAL LOW (ref 10.4–31.8)
TIBC: 445 ug/dL (ref 250–450)
UIBC: 422 ug/dL

## 2024-05-18 LAB — LACTATE DEHYDROGENASE: LDH: 330 U/L — ABNORMAL HIGH (ref 98–192)

## 2024-05-18 LAB — RETICULOCYTES
Immature Retic Fract: 11.9 % (ref 2.3–15.9)
RBC.: 6.24 MIL/uL — ABNORMAL HIGH (ref 3.87–5.11)
Retic Count, Absolute: 84.9 K/uL (ref 19.0–186.0)
Retic Ct Pct: 1.4 % (ref 0.4–3.1)

## 2024-05-18 LAB — FERRITIN: Ferritin: 9 ng/mL — ABNORMAL LOW (ref 11–307)

## 2024-05-18 LAB — SAVE SMEAR(SSMR), FOR PROVIDER SLIDE REVIEW

## 2024-05-18 MED ORDER — GABAPENTIN 300 MG PO CAPS: 300.0000 mg | ORAL_CAPSULE | Freq: Every day | ORAL | 3 refills | Status: DC

## 2024-05-18 NOTE — Progress Notes (Signed)
 Hematology and Oncology Follow Up Visit  Katrina Walters [id number] 1956-06-19 68 y.o. 05/18/2024   Principle Diagnosis:  Polycythemia Vera -- JAK2 (+)   Current Therapy:        Phlebotomy to maintain HCT below 45% Aspirin  81 mg PO daily Jakafi - 10 mg BID - started on 12/29/2023 - DC'd by patient on 01/25/2024 d/t intolerance Hydrea  500 mg PO every other day   Interim History:  Katrina Walters is here today with her husband for follow-up. She is still having itching all over without a rash despite having held the Hydrea  for the last few weeks.  Her LFT's have returned to normal since stopping Crestor . Her cardiologist is aware and will be determining a new treatment plan.  Fatigue is still present as well as hot flashes and night sweats.  Mild SOB and palpitations with exertion.  No fever, chills, n/v, cough, dizziness, chest pain, abdominal pain or changes in bowel or bladder habits.  No swelling, tenderness, numbness or tingling in her extremities at this time.  No falls or syncope.  Appetite and hydration are good. Weight is stable at 99 lbs.   ECOG Performance Status: 1 - Symptomatic but completely ambulatory  Medications:  Allergies as of 05/18/2024       Reactions   Amoxicillin     Had fever with flu like symptoms. Has patient had a PCN reaction causing immediate rash, facial/tongue/throat swelling, SOB or lightheadedness with hypotension: no Has patient had a PCN reaction causing severe rash involving mucus membranes or skin necrosis: no Has patient had a PCN reaction that required hospitalization no Has patient had a PCN reaction occurring within the last 10 years: yes If all of the above answers are NO, then may proceed with Cephalosporin use.        Medication List        Accurate as of May 18, 2024  1:41 PM. If you have any questions, ask your nurse or doctor.          STOP taking these medications    esomeprazole  40 MG capsule Commonly known as:  NexIUM  Stopped by: Lauraine Pepper   HYDROcodone -acetaminophen  5-325 MG tablet Commonly known as: NORCO/VICODIN Stopped by: Lauraine Pepper   Prolia  60 MG/ML Sosy injection Generic drug: denosumab  Stopped by: Lauraine Pepper       TAKE these medications    aspirin  81 MG chewable tablet Chew by mouth daily.   BENEFIBER DRINK MIX PO Take by mouth daily at 6 (six) AM.   Bismuth  262 MG Chew Chew 524 mg by mouth in the morning, at noon, in the evening, and at bedtime.   CALCIUM  600 PO Take 600 mg by mouth 2 (two) times daily.   cholecalciferol 25 MCG (1000 UNIT) tablet Commonly known as: VITAMIN D3 Take 1,000 Units by mouth daily.   colchicine  0.6 MG tablet Take 1 tablet (0.6 mg total) by mouth 2 (two) times daily.   Cranberry-Vitamin C 250-60 MG Caps Take 2 tablets by mouth daily.   D-MANNOSE PO Take 2 tablets by mouth daily.   diclofenac  75 MG EC tablet Commonly known as: VOLTAREN  Take 1 tablet (75 mg total) by mouth 2 (two) times daily.   diphenhydrAMINE  25 MG tablet Commonly known as: BENADRYL  Take 25 mg by mouth at bedtime.   ezetimibe  10 MG tablet Commonly known as: ZETIA  Take 1 tablet (10 mg total) by mouth daily. Please keep scheduled appointment for additional refills.   FIBER PO Take 2 tablets by mouth daily.  hydroxyurea  500 MG capsule Commonly known as: HYDREA  Take 1 capsule (500 mg total) by mouth every other day. May take with food to minimize GI side effects.   Jubbonti  60 MG/ML Sosy Generic drug: denosumab -bbdz Inject 60 mg into the skin every 6 (six) months.   levocetirizine 5 MG tablet Commonly known as: XYZAL  TAKE 1 TABLET BY MOUTH EVERY DAY IN THE EVENING   linaclotide  290 MCG Caps capsule Commonly known as: LINZESS  Take 1 capsule (290 mcg total) by mouth daily before breakfast.   Lokelma  5 g packet Generic drug: sodium zirconium cyclosilicate  5 GRAM PACK TWICE A WEEK(TUESDAY AND THURSDAY)   mupirocin  ointment 2 % Commonly known as:  BACTROBAN  Apply 1 Application topically 2 (two) times daily.   nitazoxanide  500 MG tablet Commonly known as: ALINIA  Take 1 tablet (500 mg total) by mouth 2 (two) times daily with a meal.   NON FORMULARY Take 0.5 tablets by mouth at bedtime. CBD oil - for pain Gummies   Premarin  vaginal cream Generic drug: conjugated estrogens  Place 0.25 Applicatorfuls vaginally See admin instructions. Insert 0.5 grams vaginally every other night   PRESCRIPTION MEDICATION CBD gummies nightly   SYSTANE OP Place 1 drop into both eyes in the morning and at bedtime.   triamcinolone  cream 0.1 % Commonly known as: KENALOG  Apply 1 Application topically 2 (two) times daily.        Allergies:  Allergies  Allergen Reactions   Amoxicillin      Had fever with flu like symptoms. Has patient had a PCN reaction causing immediate rash, facial/tongue/throat swelling, SOB or lightheadedness with hypotension: no Has patient had a PCN reaction causing severe rash involving mucus membranes or skin necrosis: no Has patient had a PCN reaction that required hospitalization no Has patient had a PCN reaction occurring within the last 10 years: yes If all of the above answers are NO, then may proceed with Cephalosporin use.    Past Medical History, Surgical history, Social history, and Family History were reviewed and updated.  Review of Systems: All other 10 point review of systems is negative.   Physical Exam:  weight is 99 lb 1.3 oz (44.9 kg). Her oral temperature is 98.3 F (36.8 C). Her blood pressure is 125/66 and her pulse is 95. Her respiration is 18 and oxygen saturation is 100%.   Wt Readings from Last 3 Encounters:  05/18/24 99 lb 1.3 oz (44.9 kg)  04/26/24 101 lb 1.3 oz (45.8 kg)  03/31/24 99 lb (44.9 kg)    Ocular: Sclerae unicteric, pupils equal, round and reactive to light Ear-nose-throat: Oropharynx clear, dentition fair Lymphatic: No cervical or supraclavicular adenopathy Lungs no  rales or rhonchi, good excursion bilaterally Heart regular rate and rhythm, no murmur appreciated Abd soft, nontender, positive bowel sounds MSK no focal spinal tenderness, no joint edema Neuro: non-focal, well-oriented, appropriate affect Breasts: Deferred   Lab Results  Component Value Date   WBC 16.8 (H) 05/18/2024   HGB 11.5 (L) 05/18/2024   HCT 40.8 05/18/2024   MCV 64.8 (L) 05/18/2024   PLT 556 (H) 05/18/2024   Lab Results  Component Value Date   FERRITIN 10 (L) 04/26/2024   IRON  28 04/26/2024   TIBC 449 04/26/2024   UIBC 421 04/26/2024   IRONPCTSAT 6 (L) 04/26/2024   Lab Results  Component Value Date   RETICCTPCT 1.4 05/18/2024   RBC 6.24 (H) 05/18/2024   No results found for: KPAFRELGTCHN, LAMBDASER, KAPLAMBRATIO No results found for: IGGSERUM, IGA, IGMSERUM No results  found for: STEPHANY CARLOTA BENSON MARKEL EARLA JOANNIE DOC VICK, SPEI   Chemistry      Component Value Date/Time   NA 139 05/18/2024 1204   NA 139 04/15/2021 1027   K 5.1 05/18/2024 1204   CL 101 05/18/2024 1204   CO2 26 05/18/2024 1204   BUN 13 05/18/2024 1204   BUN 14 04/15/2021 1027   CREATININE 0.81 05/18/2024 1204   CREATININE 0.84 07/14/2016 0914      Component Value Date/Time   CALCIUM  10.3 05/18/2024 1204   ALKPHOS 84 05/18/2024 1204   AST 24 05/18/2024 1204   ALT 12 05/18/2024 1204   BILITOT 0.5 05/18/2024 1204       Impression and Plan: Katrina Walters is a very pleasant 68 yo caucasian female with polycythemia vera, JAK 2 positive. She also has IDA secondary to routine phlebotomy.  Iron  studies are pending.  No phlebotomy needed this visit, Hct 40.8%. She will start Neurontin  300 mg PO at bedtime and see if this helps with her itching.  She will restart her Hydrea  next week on Monday.  Follow-up in 4 weeks.   Lauraine Pepper, NP 9/10/20251:41 PM

## 2024-05-18 NOTE — Progress Notes (Signed)
 Referral placed for pharmacy.

## 2024-05-24 DIAGNOSIS — H353132 Nonexudative age-related macular degeneration, bilateral, intermediate dry stage: Secondary | ICD-10-CM | POA: Diagnosis not present

## 2024-05-29 ENCOUNTER — Encounter: Payer: Self-pay | Admitting: Medical

## 2024-05-30 ENCOUNTER — Other Ambulatory Visit: Payer: Self-pay

## 2024-05-30 DIAGNOSIS — E785 Hyperlipidemia, unspecified: Secondary | ICD-10-CM

## 2024-06-03 ENCOUNTER — Other Ambulatory Visit (INDEPENDENT_AMBULATORY_CARE_PROVIDER_SITE_OTHER)

## 2024-06-03 DIAGNOSIS — E785 Hyperlipidemia, unspecified: Secondary | ICD-10-CM

## 2024-06-04 ENCOUNTER — Encounter: Payer: Self-pay | Admitting: Cardiovascular Disease

## 2024-06-04 ENCOUNTER — Ambulatory Visit: Payer: Self-pay | Admitting: Medical

## 2024-06-04 DIAGNOSIS — I25810 Atherosclerosis of coronary artery bypass graft(s) without angina pectoris: Secondary | ICD-10-CM

## 2024-06-04 DIAGNOSIS — E782 Mixed hyperlipidemia: Secondary | ICD-10-CM

## 2024-06-04 LAB — LIPID PANEL
Cholesterol: 140 mg/dL (ref <200)
HDL: 34 mg/dL — ABNORMAL LOW (ref >=50)
LDL Cholesterol (Calc): 83 mg/dL
Non-HDL Cholesterol (Calc): 106 mg/dL (ref <130)
Total CHOL/HDL Ratio: 4.1 (calc) (ref <5.0)
Triglycerides: 130 mg/dL (ref <150)

## 2024-06-06 ENCOUNTER — Encounter: Payer: Self-pay | Admitting: Cardiovascular Disease

## 2024-06-06 NOTE — Addendum Note (Signed)
 Addended by: GLADIS REENA GAILS on: 06/06/2024 02:05 PM   Modules accepted: Orders

## 2024-06-08 ENCOUNTER — Encounter: Payer: Self-pay | Admitting: Gastroenterology

## 2024-06-10 ENCOUNTER — Encounter (INDEPENDENT_AMBULATORY_CARE_PROVIDER_SITE_OTHER): Payer: Self-pay | Admitting: Otolaryngology

## 2024-06-10 ENCOUNTER — Ambulatory Visit (INDEPENDENT_AMBULATORY_CARE_PROVIDER_SITE_OTHER): Admitting: Otolaryngology

## 2024-06-10 VITALS — BP 113/73 | HR 82 | Temp 97.8°F | Ht 64.0 in | Wt 100.0 lb

## 2024-06-10 DIAGNOSIS — H6123 Impacted cerumen, bilateral: Secondary | ICD-10-CM

## 2024-06-10 NOTE — Progress Notes (Signed)
 Patient ID: Katrina Walters, female   DOB: 07/09/1956, 68 y.o.   MRN: [id number]  Procedure: Bilateral cerumen disimpaction.     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     Description: The patient is placed supine on the exam table.  Under the operating microscope, the right ear canal is examined and is noted to be completely impacted with cerumen.  The cerumen is carefully removed with a combination of suction catheters, cerumen curette, and alligator forceps.  After the cerumen removal, the ear canal and tympanic membrane are noted to be normal.  No middle ear effusion is noted.  The same procedure is then repeated on the left side without exception. The patient tolerated the procedure well.   Follow-up care:   The patient will follow up in 4 months.

## 2024-06-15 ENCOUNTER — Encounter: Payer: Self-pay | Admitting: Cardiovascular Disease

## 2024-06-17 ENCOUNTER — Inpatient Hospital Stay: Attending: Hematology & Oncology

## 2024-06-17 ENCOUNTER — Inpatient Hospital Stay

## 2024-06-17 ENCOUNTER — Inpatient Hospital Stay (HOSPITAL_BASED_OUTPATIENT_CLINIC_OR_DEPARTMENT_OTHER): Admitting: Family

## 2024-06-17 VITALS — BP 119/67 | HR 82 | Temp 98.3°F | Resp 19 | Ht 64.0 in | Wt 99.1 lb

## 2024-06-17 DIAGNOSIS — D509 Iron deficiency anemia, unspecified: Secondary | ICD-10-CM

## 2024-06-17 DIAGNOSIS — Z7982 Long term (current) use of aspirin: Secondary | ICD-10-CM | POA: Diagnosis not present

## 2024-06-17 DIAGNOSIS — D45 Polycythemia vera: Secondary | ICD-10-CM

## 2024-06-17 DIAGNOSIS — F458 Other somatoform disorders: Secondary | ICD-10-CM

## 2024-06-17 DIAGNOSIS — Z79899 Other long term (current) drug therapy: Secondary | ICD-10-CM | POA: Diagnosis not present

## 2024-06-17 LAB — RETICULOCYTES
Immature Retic Fract: 16.6 % — ABNORMAL HIGH (ref 2.3–15.9)
RBC.: 6.59 MIL/uL — ABNORMAL HIGH (ref 3.87–5.11)
Retic Count, Absolute: 97.5 K/uL (ref 19.0–186.0)
Retic Ct Pct: 1.5 % (ref 0.4–3.1)

## 2024-06-17 LAB — CBC WITH DIFFERENTIAL (CANCER CENTER ONLY)
Abs Immature Granulocytes: 0.08 K/uL — ABNORMAL HIGH (ref 0.00–0.07)
Basophils Absolute: 0.1 K/uL (ref 0.0–0.1)
Basophils Relative: 1 %
Eosinophils Absolute: 0.4 K/uL (ref 0.0–0.5)
Eosinophils Relative: 3 %
HCT: 43.2 % (ref 36.0–46.0)
Hemoglobin: 12 g/dL (ref 12.0–15.0)
Immature Granulocytes: 1 %
Lymphocytes Relative: 17 %
Lymphs Abs: 2.2 K/uL (ref 0.7–4.0)
MCH: 18.2 pg — ABNORMAL LOW (ref 26.0–34.0)
MCHC: 27.8 g/dL — ABNORMAL LOW (ref 30.0–36.0)
MCV: 65.7 fL — ABNORMAL LOW (ref 80.0–100.0)
Monocytes Absolute: 0.7 K/uL (ref 0.1–1.0)
Monocytes Relative: 5 %
Neutro Abs: 9.9 K/uL — ABNORMAL HIGH (ref 1.7–7.7)
Neutrophils Relative %: 73 %
Platelet Count: 501 K/uL — ABNORMAL HIGH (ref 150–400)
RBC: 6.58 MIL/uL — ABNORMAL HIGH (ref 3.87–5.11)
RDW: 23.9 % — ABNORMAL HIGH (ref 11.5–15.5)
WBC Count: 13.3 K/uL — ABNORMAL HIGH (ref 4.0–10.5)
nRBC: 0 % (ref 0.0–0.2)

## 2024-06-17 LAB — CMP (CANCER CENTER ONLY)
ALT: 15 U/L (ref 0–44)
AST: 26 U/L (ref 15–41)
Albumin: 4.3 g/dL (ref 3.5–5.0)
Alkaline Phosphatase: 93 U/L (ref 38–126)
Anion gap: 11 (ref 5–15)
BUN: 9 mg/dL (ref 8–23)
CO2: 24 mmol/L (ref 22–32)
Calcium: 9.4 mg/dL (ref 8.9–10.3)
Chloride: 102 mmol/L (ref 98–111)
Creatinine: 0.72 mg/dL (ref 0.44–1.00)
GFR, Estimated: >60 mL/min (ref >60)
Glucose, Bld: 123 mg/dL — ABNORMAL HIGH (ref 70–99)
Potassium: 5 mmol/L (ref 3.5–5.1)
Sodium: 137 mmol/L (ref 135–145)
Total Bilirubin: 0.4 mg/dL (ref 0.0–1.2)
Total Protein: 7.6 g/dL (ref 6.5–8.1)

## 2024-06-17 LAB — LACTATE DEHYDROGENASE: LDH: 197 U/L — ABNORMAL HIGH (ref 98–192)

## 2024-06-17 MED ORDER — GABAPENTIN 100 MG PO CAPS: 100.0000 mg | ORAL_CAPSULE | Freq: Every day | ORAL | 3 refills | Status: DC

## 2024-06-17 NOTE — Progress Notes (Signed)
 Hematology and Oncology Follow Up Visit  Katrina Walters [id number] [birth year] 68 y.o. 06/17/2024   Principle Diagnosis:  Polycythemia Vera -- JAK2 (+)   Current Therapy:        Phlebotomy to maintain HCT below 45% Aspirin  81 mg PO daily Jakafi - 10 mg BID - started on 12/29/2023 - DC'd by patient on 01/25/2024 d/t intolerance Hydrea  500 mg PO every other day   Interim History:  Katrina Walters is here today with hr husband for follow-up. She is doing well and notes that the Gabapentin  has helped her itching! She states that it does make her groggy the next day so we will reduce her dose from 300 mg at bedtime to 100 mg.  She has been taking her Hydrea  once every 2-3 days.  No fever, chills, n/v, cough, rash, dizziness, SOB, chest pain palpitations, abdominal pain or changes in bowel or bladder habits at this time. She will note SOB and palpitations with over exertion.  Fatigue at times.   No swelling, tenderness, numbness or tingling in her extremities at this time.  No falls or syncope.  Appetite and hydration are good. Weight remains stable at 99 lbs.   ECOG Performance Status: 1 - Symptomatic but completely ambulatory  Medications:  Allergies as of 06/17/2024       Reactions   Amoxicillin     Had fever with flu like symptoms. Has patient had a PCN reaction causing immediate rash, facial/tongue/throat swelling, SOB or lightheadedness with hypotension: no Has patient had a PCN reaction causing severe rash involving mucus membranes or skin necrosis: no Has patient had a PCN reaction that required hospitalization no Has patient had a PCN reaction occurring within the last 10 years: yes If all of the above answers are NO, then may proceed with Cephalosporin use.        Medication List        Accurate as of June 17, 2024 10:48 AM. If you have any questions, ask your nurse or doctor.          aspirin  81 MG chewable tablet Chew by mouth daily.   BENEFIBER DRINK MIX PO Take  by mouth daily at 6 (six) AM.   Bismuth  262 MG Chew Chew 524 mg by mouth in the morning, at noon, in the evening, and at bedtime.   CALCIUM  600 PO Take 600 mg by mouth 2 (two) times daily.   cholecalciferol 25 MCG (1000 UNIT) tablet Commonly known as: VITAMIN D3 Take 1,000 Units by mouth daily.   colchicine  0.6 MG tablet Take 1 tablet (0.6 mg total) by mouth 2 (two) times daily.   Cranberry-Vitamin C 250-60 MG Caps Take 2 tablets by mouth daily.   D-MANNOSE PO Take 2 tablets by mouth daily.   diclofenac  75 MG EC tablet Commonly known as: VOLTAREN  Take 1 tablet (75 mg total) by mouth 2 (two) times daily.   diphenhydrAMINE  25 MG tablet Commonly known as: BENADRYL  Take 25 mg by mouth at bedtime.   ezetimibe  10 MG tablet Commonly known as: ZETIA  Take 1 tablet (10 mg total) by mouth daily. Please keep scheduled appointment for additional refills.   FIBER PO Take 2 tablets by mouth daily.   gabapentin  300 MG capsule Commonly known as: NEURONTIN  Take 1 capsule (300 mg total) by mouth at bedtime.   hydroxyurea  500 MG capsule Commonly known as: HYDREA  Take 1 capsule (500 mg total) by mouth every other day. May take with food to minimize GI side effects.   Jubbonti   60 MG/ML Sosy Generic drug: denosumab -bbdz Inject 60 mg into the skin every 6 (six) months.   levocetirizine 5 MG tablet Commonly known as: XYZAL  TAKE 1 TABLET BY MOUTH EVERY DAY IN THE EVENING   linaclotide  290 MCG Caps capsule Commonly known as: LINZESS  Take 1 capsule (290 mcg total) by mouth daily before breakfast.   Lokelma  5 g packet Generic drug: sodium zirconium cyclosilicate  5 GRAM PACK TWICE A WEEK(TUESDAY AND THURSDAY)   mupirocin  ointment 2 % Commonly known as: BACTROBAN  Apply 1 Application topically 2 (two) times daily.   nitazoxanide  500 MG tablet Commonly known as: ALINIA  Take 1 tablet (500 mg total) by mouth 2 (two) times daily with a meal.   NON FORMULARY Take 0.5 tablets by mouth  at bedtime. CBD oil - for pain Gummies   Premarin  vaginal cream Generic drug: conjugated estrogens  Place 0.25 Applicatorfuls vaginally See admin instructions. Insert 0.5 grams vaginally every other night   PRESCRIPTION MEDICATION CBD gummies nightly   SYSTANE OP Place 1 drop into both eyes in the morning and at bedtime.   triamcinolone  cream 0.1 % Commonly known as: KENALOG  Apply 1 Application topically 2 (two) times daily.        Allergies:  Allergies  Allergen Reactions   Amoxicillin      Had fever with flu like symptoms. Has patient had a PCN reaction causing immediate rash, facial/tongue/throat swelling, SOB or lightheadedness with hypotension: no Has patient had a PCN reaction causing severe rash involving mucus membranes or skin necrosis: no Has patient had a PCN reaction that required hospitalization no Has patient had a PCN reaction occurring within the last 10 years: yes If all of the above answers are NO, then may proceed with Cephalosporin use.    Past Medical History, Surgical history, Social history, and Family History were reviewed and updated.  Review of Systems: All other 10 point review of systems is negative.   Physical Exam:  height is 5' 4 (1.626 m) and weight is 99 lb 1.9 oz (45 kg). Her oral temperature is 98.3 F (36.8 C). Her blood pressure is 119/67 and her pulse is 82. Her respiration is 19 and oxygen saturation is 100%.   Wt Readings from Last 3 Encounters:  06/17/24 99 lb 1.9 oz (45 kg)  06/10/24 100 lb (45.4 kg)  05/18/24 99 lb 1.3 oz (44.9 kg)    Ocular: Sclerae unicteric, pupils equal, round and reactive to light Ear-nose-throat: Oropharynx clear, dentition fair Lymphatic: No cervical or supraclavicular adenopathy Lungs no rales or rhonchi, good excursion bilaterally Heart regular rate and rhythm, no murmur appreciated Abd soft, nontender, positive bowel sounds MSK no focal spinal tenderness, no joint edema Neuro: non-focal,  well-oriented, appropriate affect Breasts: Deferred   Lab Results  Component Value Date   WBC 13.3 (H) 06/17/2024   HGB 12.0 06/17/2024   HCT 43.2 06/17/2024   MCV 65.7 (L) 06/17/2024   PLT 501 (H) 06/17/2024   Lab Results  Component Value Date   FERRITIN 9 (L) 05/18/2024   IRON  23 (L) 05/18/2024   TIBC 445 05/18/2024   UIBC 422 05/18/2024   IRONPCTSAT 5 (L) 05/18/2024   Lab Results  Component Value Date   RETICCTPCT 1.5 06/17/2024   RBC 6.59 (H) 06/17/2024   RBC 6.58 (H) 06/17/2024   No results found for: KPAFRELGTCHN, LAMBDASER, KAPLAMBRATIO No results found for: IGGSERUM, IGA, IGMSERUM No results found for: TOTALPROTELP, ALBUMINELP, A1GS, A2GS, BETS, BETA2SER, GAMS, MSPIKE, SPEI   Chemistry  Component Value Date/Time   NA 139 05/18/2024 1204   NA 139 04/15/2021 1027   K 5.1 05/18/2024 1204   CL 101 05/18/2024 1204   CO2 26 05/18/2024 1204   BUN 13 05/18/2024 1204   BUN 14 04/15/2021 1027   CREATININE 0.81 05/18/2024 1204   CREATININE 0.84 07/14/2016 0914      Component Value Date/Time   CALCIUM  10.3 05/18/2024 1204   ALKPHOS 84 05/18/2024 1204   AST 24 05/18/2024 1204   ALT 12 05/18/2024 1204   BILITOT 0.5 05/18/2024 1204       Impression and Plan: Katrina Walters is a very pleasant 68 yo caucasian female with polycythemia vera, JAK 2 positive. She also has IDA secondary to routine phlebotomy.  Iron  studies are pending.  No phlebotomy needed this visit, Hct 43.2%. Continue same regimen with Hydrea  every other day.  Neurontin  dose decreased to 100 mg PO at bedtime.  Follow-up in 5 weeks.   Lauraine Pepper, NP 10/10/202510:48 AM

## 2024-06-21 ENCOUNTER — Other Ambulatory Visit: Payer: Self-pay

## 2024-06-21 ENCOUNTER — Encounter: Payer: Self-pay | Admitting: Medical

## 2024-06-21 MED ORDER — DICLOFENAC SODIUM 75 MG PO TBEC: 75.0000 mg | DELAYED_RELEASE_TABLET | Freq: Two times a day (BID) | ORAL | 0 refills | Status: DC

## 2024-06-22 ENCOUNTER — Encounter: Payer: Self-pay | Admitting: Cardiovascular Disease

## 2024-06-22 ENCOUNTER — Encounter: Payer: Self-pay | Admitting: Medical

## 2024-06-23 DIAGNOSIS — H353132 Nonexudative age-related macular degeneration, bilateral, intermediate dry stage: Secondary | ICD-10-CM | POA: Diagnosis not present

## 2024-06-25 MED ORDER — RAMELTEON 8 MG PO TABS: 8.0000 mg | ORAL_TABLET | Freq: Every day | ORAL | 0 refills | Status: DC

## 2024-06-25 NOTE — Addendum Note (Signed)
 Addended by: DORINA DALLAS HERO on: 06/25/2024 07:04 AM   Modules accepted: Orders

## 2024-06-28 ENCOUNTER — Encounter: Payer: Self-pay | Admitting: Medical

## 2024-07-06 ENCOUNTER — Ambulatory Visit (INDEPENDENT_AMBULATORY_CARE_PROVIDER_SITE_OTHER): Admitting: Medical

## 2024-07-06 VITALS — BP 110/80 | HR 71 | Temp 97.9°F | Resp 15 | Ht 64.0 in | Wt 101.6 lb

## 2024-07-06 DIAGNOSIS — M25511 Pain in right shoulder: Secondary | ICD-10-CM | POA: Diagnosis not present

## 2024-07-06 DIAGNOSIS — E875 Hyperkalemia: Secondary | ICD-10-CM

## 2024-07-06 DIAGNOSIS — G4700 Insomnia, unspecified: Secondary | ICD-10-CM

## 2024-07-06 DIAGNOSIS — Z23 Encounter for immunization: Secondary | ICD-10-CM

## 2024-07-06 MED ORDER — LOKELMA 5 G PO PACK: PACK | ORAL | 1 refills | Status: AC

## 2024-07-06 MED ORDER — COLCHICINE 0.6 MG PO TABS: 0.6000 mg | ORAL_TABLET | Freq: Two times a day (BID) | ORAL | 3 refills | Status: AC

## 2024-07-06 NOTE — Progress Notes (Unsigned)
   Subjective:    Patient ID: Thomas Rhude, female    DOB: 1956-07-31, 68 y.o.   MRN: [id number]  HPI Ariana Roulhac is a 68 year old female who presents with right shoulder pain.  She has been experiencing right shoulder pain for approximately one week. Initially thought to be a pulled muscle, the pain has persisted and worsened. The pain is located in the right shoulder and radiates down the arm. The pain is exacerbated by movement, particularly when lifting her arm, and is relieved by rest. It limits her movement and causes difficulty with activities such as washing dishes and cleaning the floor. No pain occurs at rest, but it intensifies with activity. She experiences discomfort when sleeping on her right side, necessitating sleeping on her left side. A sensation of 'crunching' or crepitus in the shoulder joint is noted during movement. She has not experienced this type of pain before. Tylenol  has been used for pain relief but has not been effective. She is currently on baby aspirin  and has previously taken diclofenac  for back pain, but not for the current shoulder issue.  In terms of sleep, she has tried Rozerem for insomnia but found it ineffective, leaving her feeling tired the following day. She is currently using CBD gummies, which also make her feel tired in the morning. She has not tried magnesium  glycinate for sleep.  She is on Lokelma  for high potassium levels, which she takes twice a week, and it has been effective in maintaining her potassium levels within the normal range. She also requires refills for colchicine .   Review of Systems See hpi      Objective:   Physical Exam General- No acute distress. Pleasant patient. Neck- Full range of motion, no jvd Lungs- Clear, even and unlabored. Heart- regular rate and rhythm. Neurologic- CNII- XII grossly intact.  Rt shoulder- limited abduction due to pain. Pain on palpation anterior and posterior shoulder also direct point tender area of  pec insertion site.       Assessment & Plan:   Right shoulder pain(anterior/posterior and pec insertion site area pain) Pain at pectoralis muscle insertion, possible tendon or joint involvement. Crepitus noted. Differential includes muscle strain or tendonitis. Tylenol  ineffective. - Can try diclofenac  as you have rx.  - Refer to sports medicine for further evaluation and possible ultrasound or injection steroid if indicated?  Insomnia Insomnia persists despite Rozerem and CBD gummies, both causing residual tiredness. - Recommend magnesium  glycinate at night. Rozerem already dc'd.  Hyperkalemia, controlled with medication Hyperkalemia well-controlled with Lokelma , potassium levels normal. - Continue Lokelma  twice a week.   Follow up late November or sooner if needed  [employer], PA-C

## 2024-07-06 NOTE — Patient Instructions (Signed)
 Right shoulder pain(anterior/posterior and pec insertion site area pain) Pain at pectoralis muscle insertion, possible tendon or joint involvement. Crepitus noted. Differential includes muscle strain or tendonitis. Tylenol  ineffective. - Can try diclofenac  as you have rx.  - Refer to sports medicine for further evaluation and possible ultrasound or injection steroid if indicated?  Insomnia Insomnia persists despite Rozerem and CBD gummies, both causing residual tiredness. - Recommend magnesium  glycinate at night. Rozerem already dc'd.  Hyperkalemia, controlled with medication Hyperkalemia well-controlled with Lokelma , potassium levels normal. - Continue Lokelma  twice a week.   Follow up late November or sooner if needed

## 2024-07-08 ENCOUNTER — Ambulatory Visit

## 2024-07-08 ENCOUNTER — Ambulatory Visit (HOSPITAL_BASED_OUTPATIENT_CLINIC_OR_DEPARTMENT_OTHER)
Admission: RE | Admit: 2024-07-08 | Discharge: 2024-07-08 | Disposition: A | Discharge status: Home / Self Care | Source: Ambulatory Visit

## 2024-07-08 ENCOUNTER — Other Ambulatory Visit (HOSPITAL_BASED_OUTPATIENT_CLINIC_OR_DEPARTMENT_OTHER): Payer: Self-pay

## 2024-07-08 ENCOUNTER — Ambulatory Visit: Payer: Self-pay

## 2024-07-08 VITALS — BP 110/72 | Ht 64.0 in | Wt 101.0 lb

## 2024-07-08 DIAGNOSIS — M24811 Other specific joint derangements of right shoulder, not elsewhere classified: Secondary | ICD-10-CM

## 2024-07-08 DIAGNOSIS — M25511 Pain in right shoulder: Secondary | ICD-10-CM | POA: Diagnosis present

## 2024-07-08 MED ORDER — NAPROXEN 500 MG PO TABS
500.0000 mg | ORAL_TABLET | Freq: Two times a day (BID) | ORAL | 1 refills | Status: DC
  Filled 2024-07-08: qty 28, 14d supply, fill #0

## 2024-07-08 NOTE — Progress Notes (Signed)
 Subjective:    Patient ID: Katrina Walters, female    DOB: 68 y.o., [birth year]   MRN: [id number]  Chief Complaint: Right shoulder pain  Discussed the use of AI scribe software for clinical note transcription with the patient, who gave verbal consent to proceed.  History of Present Illness Katrina Walters is a 68 year old right-hand-dominant female with coronary artery disease, history of four-vessel CABG in 2022, polycythemia vera, Mediterranean fever presenting with acute onset atraumatic right shoulder pain.  Who presents with right shoulder pain.   She is accompanied by her husband, Nelwyn. She was referred by PA, Dallas Maxwell, for evaluation of a suspected muscle tear in the shoulder.  Right shoulder pain - Onset without specific injury - Progressively worsening over time - Exacerbated by movement - Relieved by keeping the arm still - Associated with a popping sensation during movement - Pain radiates to the neck and occasionally to the chest - Pain radiates down the arm without numbness or tingling - No history of major injury, surgery, or dislocation of the shoulder - No bruising observed over the shoulder area  Functional impairment - Difficulty with daily activities due to shoulder pain - Unable to fasten bra strap - Unable to brush hair  4 view plain film radiographs obtained of the right shoulder per my independent review today revealing moderate glenohumeral joint space narrowing.     Objective:   Vitals:   07/08/24 0940  BP: 110/72    Right shoulder ( compared to normal ) Inspection: Patient clutching right arm with her left hand.  It looks uncomfortable. Palpation: TTP + greater tuberosity, - AC joint, + biceps tendon, + posterior shoulder, + pec tendon insertion AROM/PROM: 100 forward flexion actively (130 passively), 70 abduction actively (100 passively), 30 external rotation, internal rotation to SI joint  Strength: 5-/5 lift off, 4/5 empty can, 4/5 external rotation, 4/5 flexion Special tests:    -Rotator Cuff: + Neer's, + Hawkin's, + empty can, + painful arc   -Labrum: Unable to tolerate O'Brien's or jerk.   -Biceps: + speed's, + yergason's    -AC Joint: + cross arm testing     -Instability: Unable to tolerate external rotation/apprehension/relocation test.  Unable to tolerate sulcus sign.     Assessment & Plan:   Assessment & Plan Right shoulder pain with suspected rotator cuff and pectoralis tendon injury   She experiences acute right shoulder pain, worsened by movement and accompanied by popping sounds. The differential diagnosis includes a rotator cuff tear, pectoralis tendon injury, and frozen shoulder.  Given that this all happened in an atraumatic fashion, I am befuddled as to the etiology.   There is no history of trauma, significant injury, previous shoulder surgeries, or dislocations. Pain radiates to the neck and chest but does not mimic previous cardiac symptoms. Her right shoulder shows limited range of motion and strength, with no numbness or tingling. Order a right shoulder MRI to confirm the diagnosis and a right shoulder X-ray to rule out potential bony pathology.  Short course of naproxen prescribed.  Cautioned her on this given her prior history of four-vessel CABG, though given her inability to sleep due to the severity of her pain, feel this is reasonable at this time. Instruct her to maintain range of motion with assisted exercises and advise against using a sling due to risk of stiffness.

## 2024-07-08 NOTE — Addendum Note (Signed)
 Addended by: Ferrell Flam A on: 07/08/2024 08:08 PM   Modules accepted: Orders

## 2024-07-12 ENCOUNTER — Ambulatory Visit (HOSPITAL_BASED_OUTPATIENT_CLINIC_OR_DEPARTMENT_OTHER)
Admission: RE | Admit: 2024-07-12 | Discharge: 2024-07-12 | Disposition: A | Discharge status: Home / Self Care | Source: Ambulatory Visit

## 2024-07-12 ENCOUNTER — Other Ambulatory Visit: Payer: Self-pay | Admitting: Medical

## 2024-07-12 DIAGNOSIS — M24811 Other specific joint derangements of right shoulder, not elsewhere classified: Secondary | ICD-10-CM | POA: Diagnosis present

## 2024-07-12 DIAGNOSIS — M25511 Pain in right shoulder: Secondary | ICD-10-CM | POA: Diagnosis present

## 2024-07-13 ENCOUNTER — Ambulatory Visit: Attending: Cardiology | Admitting: Pharmacist

## 2024-07-13 ENCOUNTER — Telehealth: Payer: Self-pay

## 2024-07-13 DIAGNOSIS — I25810 Atherosclerosis of coronary artery bypass graft(s) without angina pectoris: Secondary | ICD-10-CM | POA: Diagnosis not present

## 2024-07-13 DIAGNOSIS — E785 Hyperlipidemia, unspecified: Secondary | ICD-10-CM

## 2024-07-13 NOTE — Assessment & Plan Note (Addendum)
 Assessment: -  LDL goal <55 - Uncontrolled -  Current LDL 83 with previous CABG and family history of MI  Plan: -  Continue Ezetimibe  10mg  daily -  Plan to re-start Repatha  since being diagnosed with polycythemia vera and     believing that is the cause for her pruritus perviously associated with Repatha . -  Wishes to try Leqvio, due to insurance patient needs to try Repatha  first.

## 2024-07-13 NOTE — Assessment & Plan Note (Deleted)
 Uncontrolled- LDL goal <55

## 2024-07-13 NOTE — Assessment & Plan Note (Addendum)
 Katrina Walters

## 2024-07-13 NOTE — Progress Notes (Signed)
 Patient ID: Katrina Walters                 DOB: Apr 12, 1956                    MRN: [id number]      HPI: Katrina Walters is a 68 y.o. female patient referred to lipid clinic by Dr. Debby Decent. PMH is significant for HLD, CAD s/p CBG 06/04/2021, HTN, and polycythemia vera.   Last cardio visit on 11/11/2023 she stated was having chest tightness after exercise. Patient states that she is anxious by nature and that may contribute to her symptoms. At this visit she was on Rosuvastatin  10mg  and Zetia  10mg  daily. Patient has tried Repatha  before. She stopped due to pruritus. Patient wishes to start the medication again.  Reviewed Repatha  and Leqvio and reviewed cost information.  Patient stated that she has tried Repatha  in the past, she believed that it caused pruritus, but has since been diagnosed with polycythemia vera which she now knows is the cause of her itching.  Current Medications: Ezetimbe 10mg , ASA 81mg .  Intolerances: Rosuvastatin  (elevated liver enzymes), Atorvastatin  (myalgias), Pravastatin  (pruritis), Repatha  (pruritus).  Pruritus with many medications (ASK ABOUT PREVIOUS STATIN) Risk Factors: Calcium  Score of 376 in 2015, Previous CABG, family history LDL-C goal: < 55  Diet:  Eats 2 meals per day Breakfast: Fruit bar with 130 calories Dinner: Eats fish, chicken, vegetables, and salads.  Drinks: Water and rarely diet soda.  Exercise: Patient walks 3-4 times per week for at least 20 minutes per day.   Family History:  Mother: MI, died at 26 due to MI Father: MI at 40   Social History:  Smoking: Never Alcohol: Very rarely (glass of wine at Christmas).   Labs: Lipid Panel     Component Value Date/Time   CHOL 140 06/03/2024 0756   CHOL 108 10/31/2022 1048   TRIG 130 06/03/2024 0756   HDL 34 (L) 06/03/2024 0756   HDL 40 10/31/2022 1048   CHOLHDL 4.1 06/03/2024 0756   VLDL 16 02/02/2024 1053   LDLCALC 83 06/03/2024 0756   LABVLDL 17 10/31/2022 1048    Past Medical History:   Diagnosis Date   Allergy    Anemia 02/2022   Arthritis    Cataract    Complication of anesthesia    Coronary artery disease    Eating disorder    Genital warts    GERD (gastroesophageal reflux disease)    Heart murmur 2017   Hyperlipidemia    Mediterranean fever    diagnosed ~ age 50 years in [place], managed on colchicine    Osteoporosis 09/2016   Peripheral arterial disease    Polycythemia vera (HCC)    PONV (postoperative nausea and vomiting)     Current Outpatient Medications on File Prior to Visit  Medication Sig Dispense Refill   aspirin  81 MG chewable tablet Chew by mouth daily.     cholecalciferol (VITAMIN D3) 25 MCG (1000 UNIT) tablet Take 1,000 Units by mouth daily.     colchicine  0.6 MG tablet Take 1 tablet (0.6 mg total) by mouth 2 (two) times daily. 180 tablet 3   Cranberry-Vitamin C 250-60 MG CAPS Take 2 tablets by mouth daily.     D-MANNOSE PO Take 2 tablets by mouth daily.     denosumab -bbdz (JUBBONTI ) 60 MG/ML SOSY Inject 60 mg into the skin every 6 (six) months. 1 mL 1   esomeprazole  (NEXIUM ) 40 MG capsule Take 40 mg by mouth daily.  ezetimibe  (ZETIA ) 10 MG tablet Take 1 tablet (10 mg total) by mouth daily. Please keep scheduled appointment for additional refills. 90 tablet 4   famotidine  (PEPCID ) 20 MG tablet Take 40 mg by mouth daily.     gabapentin  (NEURONTIN ) 100 MG capsule Take 1 capsule (100 mg total) by mouth at bedtime. 30 capsule 3   hydroxyurea  (HYDREA ) 500 MG capsule Take 1 capsule (500 mg total) by mouth every other day. May take with food to minimize GI side effects. 30 capsule 4   linaclotide  (LINZESS ) 290 MCG CAPS capsule Take 1 capsule (290 mcg total) by mouth daily before breakfast. 90 capsule 5   Polyethyl Glycol-Propyl Glycol (SYSTANE OP) Place 1 drop into both eyes in the morning and at bedtime.     PREMARIN  vaginal cream Place 0.25 Applicatorfuls vaginally See admin instructions. Insert 0.5 grams vaginally every other night 30 g 12    sodium zirconium cyclosilicate  (LOKELMA ) 5 g packet 5 gram pack twice a week(Tuesday and Thursday) 24 packet 1   FIBER PO Take 2 tablets by mouth daily.     levocetirizine (XYZAL ) 5 MG tablet TAKE 1 TABLET BY MOUTH EVERY DAY IN THE EVENING 90 tablet 1   mupirocin  ointment (BACTROBAN ) 2 % Apply 1 Application topically 2 (two) times daily. 22 g 0   nitazoxanide  (ALINIA ) 500 MG tablet Take 1 tablet (500 mg total) by mouth 2 (two) times daily with a meal. 20 tablet 0   NON FORMULARY Take 0.5 tablets by mouth at bedtime. CBD oil - for pain Gummies     PRESCRIPTION MEDICATION CBD gummies nightly     triamcinolone  cream (KENALOG ) 0.1 % Apply 1 Application topically 2 (two) times daily. 30 g 1   Wheat Dextrin (BENEFIBER DRINK MIX PO) Take by mouth daily at 6 (six) AM.     Current Facility-Administered Medications on File Prior to Visit  Medication Dose Route Frequency Provider Last Rate Last Admin   [START ON 08/16/2024] denosumab  (PROLIA ) injection 60 mg  60 mg Subcutaneous Q6 months Saguier, Edward, PA-C        Allergies  Allergen Reactions   Amoxicillin      Had fever with flu like symptoms. Has patient had a PCN reaction causing immediate rash, facial/tongue/throat swelling, SOB or lightheadedness with hypotension: no Has patient had a PCN reaction causing severe rash involving mucus membranes or skin necrosis: no Has patient had a PCN reaction that required hospitalization no Has patient had a PCN reaction occurring within the last 10 years: yes If all of the above answers are NO, then may proceed with Cephalosporin use.    Assessment/Plan:  Hyperlipidemia Assessment: -  LDL goal <55 - Uncontrolled -  Current LDL 83 with previous CABG and family history of MI Plan: -  Continue Ezetimibe  10mg  daily -  Plan to re-start Repatha    Follow-Up: Labs in 3 months  Lum Ricks, PharmD Candidate  [employer]    Thank you,  Eleanor JONETTA Crews,  Pharm.JONETTA SARAN, CPP [facility] of [facility] [address] [place], [place] [address]  Phone: [phone]; Fax: [phone]

## 2024-07-13 NOTE — Telephone Encounter (Signed)
 Chronic condition form faxed on 07/12/24 with confirmation recieved

## 2024-07-14 ENCOUNTER — Encounter: Payer: Self-pay | Admitting: Family

## 2024-07-14 ENCOUNTER — Telehealth: Payer: Self-pay | Admitting: Pharmacy Technician

## 2024-07-14 ENCOUNTER — Other Ambulatory Visit (HOSPITAL_COMMUNITY): Payer: Self-pay

## 2024-07-14 DIAGNOSIS — E782 Mixed hyperlipidemia: Secondary | ICD-10-CM

## 2024-07-14 NOTE — Telephone Encounter (Signed)
   Pharmacy Patient Advocate Encounter   Received notification from Pt Calls Messages that prior authorization for repatha  is required/requested.   Insurance verification completed.   The patient is insured through [facility]   Per pa:    Per test claim 0.00

## 2024-07-15 ENCOUNTER — Other Ambulatory Visit (HOSPITAL_COMMUNITY): Payer: Self-pay

## 2024-07-15 ENCOUNTER — Other Ambulatory Visit: Payer: Self-pay

## 2024-07-15 ENCOUNTER — Other Ambulatory Visit (HOSPITAL_BASED_OUTPATIENT_CLINIC_OR_DEPARTMENT_OTHER): Payer: Self-pay

## 2024-07-15 MED ORDER — REPATHA SURECLICK 140 MG/ML ~~LOC~~ SOAJ
140.0000 mg | SUBCUTANEOUS | 3 refills | Status: AC
  Filled 2024-07-15: qty 6, 84d supply, fill #0
  Filled 2024-10-06: qty 6, 84d supply, fill #1

## 2024-07-15 NOTE — Addendum Note (Signed)
 Addended by: Trason Shifflet D on: 07/15/2024 10:34 AM   Modules accepted: Orders

## 2024-07-15 NOTE — Addendum Note (Signed)
 Addended by: Glenard Keesling D on: 07/15/2024 10:36 AM   Modules accepted: Orders

## 2024-07-18 NOTE — Telephone Encounter (Signed)
 Awating auth to come through.

## 2024-07-20 ENCOUNTER — Ambulatory Visit

## 2024-07-20 ENCOUNTER — Other Ambulatory Visit: Payer: Self-pay

## 2024-07-20 ENCOUNTER — Telehealth: Payer: Self-pay

## 2024-07-20 VITALS — BP 108/70 | Ht 64.0 in | Wt 101.0 lb

## 2024-07-20 DIAGNOSIS — M7591 Shoulder lesion, unspecified, right shoulder: Secondary | ICD-10-CM

## 2024-07-20 DIAGNOSIS — M7581 Other shoulder lesions, right shoulder: Secondary | ICD-10-CM

## 2024-07-20 DIAGNOSIS — M19011 Primary osteoarthritis, right shoulder: Secondary | ICD-10-CM

## 2024-07-20 DIAGNOSIS — M810 Age-related osteoporosis without current pathological fracture: Secondary | ICD-10-CM

## 2024-07-20 DIAGNOSIS — S46219A Strain of muscle, fascia and tendon of other parts of biceps, unspecified arm, initial encounter: Secondary | ICD-10-CM | POA: Diagnosis not present

## 2024-07-20 MED ORDER — METHYLPREDNISOLONE ACETATE 40 MG/ML IJ SUSP
40.0000 mg | Freq: Once | INTRAMUSCULAR | Status: AC
  Administered 2024-07-20: 40 mg via INTRA_ARTICULAR

## 2024-07-20 NOTE — Progress Notes (Signed)
   Subjective:    Patient ID: Katrina Walters, female    DOB: 68 y.o., July 22, 1956   MRN: [id number]  Chief Complaint: Follow-up to discuss MRI results  Discussed the use of AI scribe software for clinical note transcription with the patient, who gave verbal consent to proceed.  History of Present Illness Katrina Walters is a 68 year old female with a history of tendinosis and partial tearing in the supraspinatus and infraspinatus muscles who presents with chronic shoulder pain and tendinosis.  Shoulder pain and dysfunction - Chronic shoulder pain with underlying tendinosis and partial tearing of the supraspinatus and infraspinatus muscles. - Pain radiates to the neck and is described as different from previous episodes. - Pain intensified after physical activity on Friday night, resulting in severe discomfort that woke her at 3 AM; Tylenol  provided relief and allowed her to return to sleep. - Similar pain recurred the following night, but she managed without additional medication. - Immediate pain onset after a seven-pound cat jumped onto her shoulder while she was lying in bed, with progressive worsening since the incident. - Daily activities such as cleaning and hair care exacerbate pain, particularly with arm elevation or backward movement. - Burning sensation in the arm after use. - Currently taking Tylenol  as needed for pain management. - No recent fever.     Objective:   Vitals:   07/20/24 1027  BP: 108/70    Const: appears well if not somewhat anxious, non-toxic, well groomed Psych: affect bright, interactive, smiling EENT: EOMI intact, conjunctiva appear normal Neck: no obvious masses, appears symmetric Resp: non-labored, appears symmetric Neuro: muscle bulk appears normal Skin: no obvious rashes noted   Right intra-articular Shoulder Injection with Ultrasound Guidance Procedure Note Devonne Kitchen [birth year] Indications: Pain Procedure Details Following the description of risks  including infection bleeding, damage to surrounding structures, patient provided verbal/written consent for right glenohumeral corticosteroid injection procedure with ultrasound. US  was used to identify the glenohumeral space. Patient was sterilely prepped in the usual fashion with chlorhexidine .  Following topical anesthetization with ethyl chloride they were injected with a solution of 40mg  Depo-medrol  and 3cc Mepivacaine 2%. This was well visualized under ultrasound, please see associated photographic documentation. Patient tolerated well without complication.  Precautions provided. Cleaned and dressing applied.     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 Pain worsens with activities like using a hair dryer and cleaning. MRI indicates rotator cuff tears and arthritis as pain sources. Conservative management is preferred initially, with surgery as a last resort. A steroid injection was administered into the right shoulder joint. Physical therapy will begin one week post-injection. A follow-up appointment is scheduled in six weeks to assess treatment response. Referral to a surgeon for reverse total shoulder replacement will be considered if conservative management is ineffective.

## 2024-07-20 NOTE — Telephone Encounter (Signed)
 Prolia  VOB initiated via [email or web address]  Next Prolia  inj DUE: 08/19/24

## 2024-07-21 ENCOUNTER — Other Ambulatory Visit (HOSPITAL_COMMUNITY): Payer: Self-pay

## 2024-07-21 NOTE — Telephone Encounter (Signed)
 SABRA

## 2024-07-21 NOTE — Telephone Encounter (Signed)
 Pt ready for scheduling for PROLIA  on or after : 08/19/24  Option# 1: Buy/Bill (Office supplied medication)  Out-of-pocket cost due at time of clinic visit: $332  Number of injection/visits approved: 2  Primary: [employer] Prolia  co-insurance: 20% Admin fee co-insurance: 0%  Secondary: --- Prolia  co-insurance:  Admin fee co-insurance:   Medical Benefit Details: Date Benefits were checked: 07/20/24 Deductible: NO/ Coinsurance: 20%/ Admin Fee: 0%  Prior Auth: APPROVED PA# [id number] Expiration Date: 02/16/24-02/15/25  # of doses approved: 2 ----------------------------------------------------------------------- Option# 2- Med Obtained from pharmacy: Prolia  is no longer preferred for pharmacy benefit. Jubbonti  is now preferred. PRICING IS FOR JUBBONTI   Pharmacy benefit: Copay $0 (Paid to pharmacy) Admin Fee: 0% (Pay at clinic)  Prior Auth: N/A PA# Expiration Date:   # of doses approved:   If patient wants fill through the pharmacy benefit please send prescription to: [facility], and include estimated need by date in rx notes. Pharmacy will ship medication directly to the office.  Patient NOT eligible for Prolia  Copay Card. Copay Card can make patient's cost as little as $25. Link to apply: [email or web address]://[email or web address]  ** This summary of benefits is an estimation of the patient's out-of-pocket cost. Exact cost may very based on individual plan coverage.

## 2024-07-22 ENCOUNTER — Telehealth: Payer: Self-pay | Admitting: *Deleted

## 2024-07-22 NOTE — Telephone Encounter (Signed)
 Left message on machine to call back to discuss Prolia .  Please forward to office when pt calls back.

## 2024-07-25 ENCOUNTER — Other Ambulatory Visit: Payer: Self-pay | Admitting: Medical

## 2024-07-25 ENCOUNTER — Inpatient Hospital Stay (HOSPITAL_BASED_OUTPATIENT_CLINIC_OR_DEPARTMENT_OTHER): Admitting: Family

## 2024-07-25 ENCOUNTER — Inpatient Hospital Stay: Attending: Hematology & Oncology

## 2024-07-25 ENCOUNTER — Inpatient Hospital Stay

## 2024-07-25 VITALS — BP 125/66 | HR 86 | Temp 97.8°F | Resp 18 | Wt 100.0 lb

## 2024-07-25 DIAGNOSIS — F458 Other somatoform disorders: Secondary | ICD-10-CM

## 2024-07-25 DIAGNOSIS — D509 Iron deficiency anemia, unspecified: Secondary | ICD-10-CM

## 2024-07-25 DIAGNOSIS — D45 Polycythemia vera: Secondary | ICD-10-CM

## 2024-07-25 LAB — CBC WITH DIFFERENTIAL (CANCER CENTER ONLY)
Abs Immature Granulocytes: 0.15 K/uL — ABNORMAL HIGH (ref 0.00–0.07)
Basophils Absolute: 0.1 K/uL (ref 0.0–0.1)
Basophils Relative: 1 %
Eosinophils Absolute: 0.4 K/uL (ref 0.0–0.5)
Eosinophils Relative: 2 %
HCT: 42.1 % (ref 36.0–46.0)
Hemoglobin: 11.8 g/dL — ABNORMAL LOW (ref 12.0–15.0)
Immature Granulocytes: 1 %
Lymphocytes Relative: 12 %
Lymphs Abs: 2.3 K/uL (ref 0.7–4.0)
MCH: 18.7 pg — ABNORMAL LOW (ref 26.0–34.0)
MCHC: 28 g/dL — ABNORMAL LOW (ref 30.0–36.0)
MCV: 66.6 fL — ABNORMAL LOW (ref 80.0–100.0)
Monocytes Absolute: 0.9 K/uL (ref 0.1–1.0)
Monocytes Relative: 5 %
Neutro Abs: 15.2 K/uL — ABNORMAL HIGH (ref 1.7–7.7)
Neutrophils Relative %: 79 %
Platelet Count: 608 K/uL — ABNORMAL HIGH (ref 150–400)
RBC: 6.32 MIL/uL — ABNORMAL HIGH (ref 3.87–5.11)
RDW: 24.6 % — ABNORMAL HIGH (ref 11.5–15.5)
WBC Count: 18.9 K/uL — ABNORMAL HIGH (ref 4.0–10.5)
nRBC: 0 % (ref 0.0–0.2)

## 2024-07-25 LAB — CMP (CANCER CENTER ONLY)
ALT: 13 U/L (ref 0–44)
AST: 23 U/L (ref 15–41)
Albumin: 4.4 g/dL (ref 3.5–5.0)
Alkaline Phosphatase: 90 U/L (ref 38–126)
Anion gap: 10 (ref 5–15)
BUN: 14 mg/dL (ref 8–23)
CO2: 26 mmol/L (ref 22–32)
Calcium: 9.7 mg/dL (ref 8.9–10.3)
Chloride: 99 mmol/L (ref 98–111)
Creatinine: 0.67 mg/dL (ref 0.44–1.00)
GFR, Estimated: >60 mL/min (ref >60)
Glucose, Bld: 100 mg/dL — ABNORMAL HIGH (ref 70–99)
Potassium: 5.1 mmol/L (ref 3.5–5.1)
Sodium: 134 mmol/L — ABNORMAL LOW (ref 135–145)
Total Bilirubin: 0.5 mg/dL (ref 0.0–1.2)
Total Protein: 7.4 g/dL (ref 6.5–8.1)

## 2024-07-25 LAB — RETICULOCYTES
Immature Retic Fract: 14.6 % (ref 2.3–15.9)
RBC.: 6.27 MIL/uL — ABNORMAL HIGH (ref 3.87–5.11)
Retic Count, Absolute: 95.9 K/uL (ref 19.0–186.0)
Retic Ct Pct: 1.5 % (ref 0.4–3.1)

## 2024-07-25 LAB — LACTATE DEHYDROGENASE: LDH: 171 U/L (ref 105–235)

## 2024-07-25 NOTE — Progress Notes (Signed)
 Hematology and Oncology Follow Up Visit  Katrina Walters [id number] 03/02/1956 68 y.o. 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 is here today with her husband for follow-up. She is doing fairly well. Her cat jumped up onto her right shoulder while she was in bed and she ended up with a partial thickness bursal tear along the anterior and tendonitis. She is currently in an immobilizer and recently got a steroid injection. She goes back next month and if this has not healed she will need to have surgery.  She is tolerating Hydrea  500 mg PO every other day nicely.  WBC count 18.9, Hgb 11.8/Hct 42 and platelets 608.  Neurontin  100 mg PO at bedtime has resolved her itching.  No fever, chills, n/v, cough, rash, dizziness, SOB, chest pain, palpitations, abdominal pain or changes in bowel or bladder habits.  Numbness and tingling in the toes waxes and wanes.  No falls or syncope reported.  Appetite and hydration are good. Weight is stable at 100 lbs.  No blood loss noted. No bruising or petechiae.   ECOG Performance Status: 1 - Symptomatic but completely ambulatory  Medications:  Allergies as of 07/25/2024       Reactions   Amoxicillin     Had fever with flu like symptoms. Has patient had a PCN reaction causing immediate rash, facial/tongue/throat swelling, SOB or lightheadedness with hypotension: no Has patient had a PCN reaction causing severe rash involving mucus membranes or skin necrosis: no Has patient had a PCN reaction that required hospitalization no Has patient had a PCN reaction occurring within the last 10 years: yes If all of the above answers are NO, then may  proceed with Cephalosporin use.        Medication List        Accurate as of July 25, 2024 10:12 AM. If you have any questions, ask your nurse or doctor.          aspirin  81 MG chewable tablet Chew by mouth daily.   BENEFIBER DRINK MIX PO Take by mouth daily at 6 (six) AM.   cholecalciferol 25 MCG (1000 UNIT) tablet Commonly known as: VITAMIN D3 Take 1,000 Units by mouth daily.   colchicine  0.6 MG tablet Take 1 tablet (0.6 mg total) by mouth 2 (two) times daily.   Cranberry-Vitamin C 250-60 MG Caps Take 2 tablets by mouth daily.   D-MANNOSE PO Take 2 tablets by mouth daily.   esomeprazole  40 MG capsule Commonly known as: NEXIUM  Take 40 mg by mouth daily.   ezetimibe  10 MG tablet Commonly known as: ZETIA  Take 1 tablet (10 mg total) by mouth daily. Please keep scheduled appointment for additional refills.   famotidine  20 MG tablet Commonly known as: PEPCID  Take 40 mg by mouth daily.   FIBER PO Take 2 tablets by mouth daily.   gabapentin  100 MG capsule Commonly known as: NEURONTIN  Take 1 capsule (100 mg total) by mouth at bedtime.   hydroxyurea  500 MG capsule Commonly known as: HYDREA  Take 1 capsule (500 mg total) by mouth every other day. May take with food to minimize GI side effects.  Jubbonti  60 MG/ML Sosy injection Generic drug: denosumab -bbdz Inject 60 mg into the skin every 6 (six) months.   levocetirizine 5 MG tablet Commonly known as: XYZAL  TAKE 1 TABLET BY MOUTH EVERY DAY IN THE EVENING   linaclotide  290 MCG Caps capsule Commonly known as: LINZESS  Take 1 capsule (290 mcg total) by mouth daily before breakfast.   Lokelma  5 g packet Generic drug: sodium zirconium cyclosilicate  5 gram pack twice a week(Tuesday and Thursday)   mupirocin  ointment 2 % Commonly known as: BACTROBAN  Apply 1 Application topically 2 (two) times daily.   nitazoxanide  500 MG tablet Commonly known as: ALINIA  Take 1 tablet (500 mg total) by mouth 2 (two)  times daily with a meal.   NON FORMULARY Take 0.5 tablets by mouth at bedtime. CBD oil - for pain Gummies   Premarin  vaginal cream Generic drug: conjugated estrogens  Place 0.25 Applicatorfuls vaginally See admin instructions. Insert 0.5 grams vaginally every other night   PRESCRIPTION MEDICATION CBD gummies nightly   Repatha  SureClick 140 MG/ML Soaj Generic drug: Evolocumab  Inject 140 mg into the skin every 14 (fourteen) days. Please deliver   SYSTANE OP Place 1 drop into both eyes in the morning and at bedtime.   triamcinolone  cream 0.1 % Commonly known as: KENALOG  Apply 1 Application topically 2 (two) times daily.        Allergies:  Allergies  Allergen Reactions   Amoxicillin      Had fever with flu like symptoms. Has patient had a PCN reaction causing immediate rash, facial/tongue/throat swelling, SOB or lightheadedness with hypotension: no Has patient had a PCN reaction causing severe rash involving mucus membranes or skin necrosis: no Has patient had a PCN reaction that required hospitalization no Has patient had a PCN reaction occurring within the last 10 years: yes If all of the above answers are NO, then may proceed with Cephalosporin use.    Past Medical History, Surgical history, Social history, and Family History were reviewed and updated.  Review of Systems: All other 10 point review of systems is negative.   Physical Exam:  weight is 100 lb (45.4 kg). Her oral temperature is 97.8 F (36.6 C). Her blood pressure is 125/66 and her pulse is 86. Her respiration is 18 and oxygen saturation is 100%.   Wt Readings from Last 3 Encounters:  07/25/24 100 lb (45.4 kg)  07/20/24 101 lb (45.8 kg)  07/08/24 101 lb (45.8 kg)    Ocular: Sclerae unicteric, pupils equal, round and reactive to light Ear-nose-throat: Oropharynx clear, dentition fair Lymphatic: No cervical or supraclavicular adenopathy Lungs no rales or rhonchi, good excursion bilaterally Heart  regular rate and rhythm, no murmur appreciated Abd soft, nontender, positive bowel sounds MSK no focal spinal tenderness, no joint edema Neuro: non-focal, well-oriented, appropriate affect Breasts: Deferred   Lab Results  Component Value Date   WBC 13.3 (H) 06/17/2024   HGB 12.0 06/17/2024   HCT 43.2 06/17/2024   MCV 65.7 (L) 06/17/2024   PLT 501 (H) 06/17/2024   Lab Results  Component Value Date   FERRITIN 9 (L) 05/18/2024   IRON  23 (L) 05/18/2024   TIBC 445 05/18/2024   UIBC 422 05/18/2024   IRONPCTSAT 5 (L) 05/18/2024   Lab Results  Component Value Date   RETICCTPCT 1.5 07/25/2024   RBC 6.27 (H) 07/25/2024   No results found for: KPAFRELGTCHN, LAMBDASER, KAPLAMBRATIO No results found for: IGGSERUM, IGA, IGMSERUM No results found for: TOTALPROTELP, ALBUMINELP, A1GS, A2GS, BETS, BETA2SER, GAMS, MSPIKE, SPEI  Chemistry      Component Value Date/Time   NA 137 06/17/2024 1025   NA 139 04/15/2021 1027   K 5.0 06/17/2024 1025   CL 102 06/17/2024 1025   CO2 24 06/17/2024 1025   BUN 9 06/17/2024 1025   BUN 14 04/15/2021 1027   CREATININE 0.72 06/17/2024 1025   CREATININE 0.84 07/14/2016 0914      Component Value Date/Time   CALCIUM  9.4 06/17/2024 1025   ALKPHOS 93 06/17/2024 1025   AST 26 06/17/2024 1025   ALT 15 06/17/2024 1025   BILITOT 0.4 06/17/2024 1025       Impression and Plan: Ms. Meeker is a very pleasant 68 yo caucasian female with polycythemia vera, JAK 2 positive. She also has IDA secondary to routine phlebotomy.  Iron  studies are pending.  No phlebotomy needed this visit, Hct 42.1% Continue same regimen with Hydrea  every other day.  Continue Neurontin  100 mg at bedtime for neuropathic pruritus.  Follow-up in 5 weeks.   Lauraine Pepper, NP 11/17/202510:12 AM

## 2024-07-25 NOTE — Addendum Note (Signed)
 Addended by: ESTELLE GILLIS D on: 07/25/2024 05:03 PM   Modules accepted: Orders

## 2024-07-26 ENCOUNTER — Other Ambulatory Visit: Payer: Self-pay

## 2024-07-26 MED ORDER — JUBBONTI 60 MG/ML ~~LOC~~ SOSY
60.0000 mg | PREFILLED_SYRINGE | SUBCUTANEOUS | 0 refills | Status: AC
  Filled 2024-08-08: qty 1, 180d supply, fill #0

## 2024-07-26 NOTE — Progress Notes (Signed)
 Pharmacy Patient Advocate Encounter  Insurance verification completed.   The patient is insured through [employer] claim for Jubbonti . Co-pay is $0.  This test claim was processed through [facility]- copay amounts may vary at other pharmacies due to pharmacy/plan contracts, or as the patient moves through the different stages of their insurance plan.

## 2024-07-26 NOTE — Telephone Encounter (Signed)
 Pt scheduled for 08/23/24.

## 2024-08-01 ENCOUNTER — Encounter: Payer: Self-pay | Admitting: Pharmacist

## 2024-08-01 ENCOUNTER — Encounter: Payer: Self-pay | Admitting: Medical

## 2024-08-01 ENCOUNTER — Ambulatory Visit: Payer: Self-pay | Admitting: Medical

## 2024-08-01 ENCOUNTER — Ambulatory Visit: Admitting: Medical

## 2024-08-01 VITALS — BP 117/71 | HR 68 | Temp 97.5°F | Ht 64.0 in | Wt 101.2 lb

## 2024-08-01 DIAGNOSIS — E875 Hyperkalemia: Secondary | ICD-10-CM

## 2024-08-01 DIAGNOSIS — E785 Hyperlipidemia, unspecified: Secondary | ICD-10-CM | POA: Diagnosis not present

## 2024-08-01 DIAGNOSIS — Z0000 Encounter for general adult medical examination without abnormal findings: Secondary | ICD-10-CM | POA: Diagnosis not present

## 2024-08-01 DIAGNOSIS — R739 Hyperglycemia, unspecified: Secondary | ICD-10-CM

## 2024-08-01 DIAGNOSIS — A048 Other specified bacterial intestinal infections: Secondary | ICD-10-CM

## 2024-08-01 LAB — COMPREHENSIVE METABOLIC PANEL WITH GFR
ALT: 13 U/L (ref 0–35)
AST: 17 U/L (ref 0–37)
Albumin: 4.3 g/dL (ref 3.5–5.2)
Alkaline Phosphatase: 78 U/L (ref 39–117)
BUN: 12 mg/dL (ref 6–23)
CO2: 30 meq/L (ref 19–32)
Calcium: 9.4 mg/dL (ref 8.4–10.5)
Chloride: 101 meq/L (ref 96–112)
Creatinine, Ser: 0.68 mg/dL (ref 0.40–1.20)
GFR: 90.1 mL/min (ref >60.00)
Glucose, Bld: 77 mg/dL (ref 70–99)
Potassium: 5.2 meq/L — ABNORMAL HIGH (ref 3.5–5.1)
Sodium: 137 meq/L (ref 135–145)
Total Bilirubin: 0.7 mg/dL (ref 0.2–1.2)
Total Protein: 7.3 g/dL (ref 6.0–8.3)

## 2024-08-01 LAB — LIPID PANEL
Cholesterol: 106 mg/dL (ref 0–200)
HDL: 39.7 mg/dL (ref >39.00)
LDL Cholesterol: 44 mg/dL (ref 0–99)
NonHDL: 66.28
Total CHOL/HDL Ratio: 3
Triglycerides: 113 mg/dL (ref 0.0–149.0)
VLDL: 22.6 mg/dL (ref 0.0–40.0)

## 2024-08-01 LAB — HEMOGLOBIN A1C: Hgb A1c MFr Bld: 5.3 % (ref 4.6–6.5)

## 2024-08-01 MED ORDER — FAMOTIDINE 20 MG PO TABS: 40.0000 mg | ORAL_TABLET | Freq: Every day | ORAL | 3 refills | Status: AC

## 2024-08-01 NOTE — Progress Notes (Signed)
 Subjective:    Patient ID: Katrina Walters, female    DOB: [birth year], 68 y.o.   MRN: [id number]  HPI  Pt in for wellness exam. Pt is fasting.  Pt up to date on mammogram. Last colonoscopy was in 2021. Pt followed by gyn. On review history of hysterectomy and they report ovaries removed.   On review has high cholesterol, elevated sugar and high k. Update me she may need rt shoulder surgery.  Mri showed below ordered by sport med MD IMPRESSION: 1. Severe supraspinatus tendinosis with a high-grade partial-thickness bursal surface tear along the anterior insertion measuring 6.6 mm AP and 12.2 mm medial-lateral. 2. Moderate infraspinatus tendinosis. 3. Mild tendinosis of the intra-articular portion of the long head of the biceps tendon. 4. Severe osteoarthritis of the right glenohumeral joint. Moderate joint effusion with synovitis in the axillary recess. 5. Mild subacromial/subdeltoid b   Hx of h pylori. Never went to ID referral though did place. She failed various regimens due to gi side effects. Place referral again and give number for you to call that office.  Pt expressed she does not want additional charge other than wellness exam. She did report one minute palpitation. Then resolved. No associated symptoms. She declines EKG today though did explain for this complaint typically order.      Review of Systems  Constitutional:  Negative for chills and fever.  HENT:  Negative for dental problem.   Respiratory:  Negative for choking, shortness of breath and wheezing.        No palpitation presently.  Cardiovascular:  Negative for chest pain and palpitations.  Gastrointestinal:  Negative for abdominal pain.  Genitourinary:  Negative for difficulty urinating and dysuria.  Musculoskeletal:  Negative for back pain.  Skin:  Negative for rash.  Neurological:  Negative for dizziness and light-headedness.  Hematological:  Negative for adenopathy.  Psychiatric/Behavioral:  Negative for  behavioral problems. The patient is not nervous/anxious.     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    Osteoporosis 09/2016   Peripheral arterial disease    Polycythemia vera (HCC)    PONV (postoperative nausea and vomiting)      Social History   Socioeconomic History   Marital status: Married    Spouse name: Not on file   Number of children: 1   Years of education: Not on file   Highest education level: GED or equivalent  Occupational History   Occupation: unemployed at the time  Tobacco Use   Smoking status: Never   Smokeless tobacco: Never  Vaping Use   Vaping status: Never Used  Substance and Sexual Activity   Alcohol use: Yes    Alcohol/week: 1.0 standard drink of alcohol    Types: 1 Glasses of wine per week    Comment: occassionally   Drug use: No    Comment: CBD gummies   Sexual activity: Yes    Birth control/protection: Post-menopausal  Other Topics Concern   Not on file  Social History Narrative   Not on file   Social Drivers of Health   Financial Resource Strain: Medium  Risk (07/05/2024)   Overall Financial Resource Strain (CARDIA)    Difficulty of Paying Living Expenses: Somewhat hard  Food Insecurity: Food Insecurity Present (07/05/2024)   Hunger Vital Sign    Worried About Running Out of Food in the Last Year: Sometimes true    Ran Out of Food in the Last Year: Sometimes true  Transportation Needs: No Transportation Needs (07/05/2024)   PRAPARE - Administrator, Civil Service (Medical): No    Lack of Transportation (Non-Medical): No  Physical Activity: Insufficiently Active (07/05/2024)   Exercise Vital Sign    Days of  Exercise per Week: 3 days    Minutes of Exercise per Session: 20 min  Stress: No Stress Concern Present (07/05/2024)   [employer] of Occupational Health - Occupational Stress Questionnaire    Feeling of Stress: Only a little  Social Connections: Moderately Isolated (07/05/2024)   Social Connection and Isolation Panel    Frequency of Communication with Friends and Family: More than three times a week    Frequency of Social Gatherings with Friends and Family: Once a week    Attends Religious Services: Patient declined    Database Administrator or Organizations: No    Attends Engineer, Structural: Not on file    Marital Status: Married  Intimate Partner Violence: Not At Risk (03/31/2024)   Humiliation, Afraid, Rape, and Kick questionnaire    Fear of Current or Ex-Partner: No    Emotionally Abused: No    Physically Abused: No    Sexually Abused: No    Past Surgical History:  Procedure Laterality Date   ANAL RECTAL MANOMETRY N/A 11/12/2020   Procedure: ANO RECTAL MANOMETRY;  Surgeon: San Sandor GAILS, DO;  Location: [facility];  Service: Gastroenterology;  Laterality: N/A;   BREAST REDUCTION SURGERY  2006   CARDIAC CATHETERIZATION N/A 07/17/2016   Procedure: Left Heart Cath and Coronary Angiography;  Surgeon: Lonni JONETTA Cash, MD;  Location: [facility] INVASIVE CV LAB;  Service: Cardiovascular;  Laterality: N/A;   CATARACT EXTRACTION W/ INTRAOCULAR LENS IMPLANT Bilateral    COLONOSCOPY  2015   CORONARY ARTERY BYPASS GRAFT N/A 06/04/2021   Procedure: CORONARY ARTERY BYPASS GRAFTING (CABG), ON PUMP, TIMES FOUR, USING LEFT INTERNAL MAMMARY ARTERY, LEFT RADIAL ARTERY (HARVESTED OPEN), AND RIGHT GREATER SAPHENOUS VEIN (HARVESTED ENDOSCOPICALLY);  Surgeon: Shyrl Linnie KIDD, MD;  Location: [facility] OR;  Service: Open Heart Surgery;  Laterality: N/A;   EYE SURGERY     LEFT HEART CATH AND CORONARY ANGIOGRAPHY N/A 04/22/2021   Procedure: LEFT HEART CATH AND CORONARY ANGIOGRAPHY;   Surgeon: Claudene Victory ORN, MD;  Location: [facility] INVASIVE CV LAB;  Service: Cardiovascular;  Laterality: N/A;   POLYPECTOMY  2015   RADIAL ARTERY HARVEST Left 06/04/2021   Procedure: RADIAL ARTERY HARVEST;  Surgeon: Shyrl Linnie KIDD, MD;  Location: [facility] OR;  Service: Open Heart Surgery;  Laterality: Left;   TEE WITHOUT CARDIOVERSION N/A 06/04/2021   Procedure: TRANSESOPHAGEAL ECHOCARDIOGRAM (TEE);  Surgeon: Shyrl Linnie KIDD, MD;  Location: [facility] OR;  Service: Open Heart Surgery;  Laterality: N/A;   TONSILLECTOMY     VAGINAL HYSTERECTOMY  08/2019   w/ apical suspension, A&P repair 08/2019 at [facility]    Family History  Problem Relation Age of Onset   Heart disease Mother        died at 9 of heart attack   Hypertension Mother    Heart attack Mother    Heart disease Father  had heart attack at age 43   Heart attack Father    Rectal cancer Father 56   Hearing loss Father    Cancer Paternal Aunt    Stomach cancer Paternal Grandmother    Colon cancer Neg Hx    Colon polyps Neg Hx    Esophageal cancer Neg Hx     Allergies  Allergen Reactions   Amoxicillin      Had fever with flu like symptoms. Has patient had a PCN reaction causing immediate rash, facial/tongue/throat swelling, SOB or lightheadedness with hypotension: no Has patient had a PCN reaction causing severe rash involving mucus membranes or skin necrosis: no Has patient had a PCN reaction that required hospitalization no Has patient had a PCN reaction occurring within the last 10 years: yes If all of the above answers are NO, then may proceed with Cephalosporin use.    Current Outpatient Medications on File Prior to Visit  Medication Sig Dispense Refill   aspirin  81 MG chewable tablet Chew by mouth daily.     cholecalciferol (VITAMIN D3) 25 MCG (1000 UNIT) tablet Take 1,000 Units by mouth daily.     colchicine  0.6 MG tablet Take 1 tablet (0.6 mg total) by mouth 2 (two) times daily. 180 tablet 3   Cranberry-Vitamin C  250-60 MG CAPS Take 2 tablets by mouth daily.     D-MANNOSE PO Take 2 tablets by mouth daily.     denosumab -bbdz (JUBBONTI ) 60 MG/ML SOSY injection Inject 60 mg into the skin every 6 (six) months. Dx code: M81.0.  Pt has appt on 08/23/24 1 mL 0   esomeprazole  (NEXIUM ) 40 MG capsule Take 40 mg by mouth daily.     Evolocumab  (REPATHA  SURECLICK) 140 MG/ML SOAJ Inject 140 mg into the skin every 14 (fourteen) days. Please deliver 6 mL 3   ezetimibe  (ZETIA ) 10 MG tablet Take 1 tablet (10 mg total) by mouth daily. Please keep scheduled appointment for additional refills. 90 tablet 4   famotidine  (PEPCID ) 20 MG tablet Take 40 mg by mouth daily.     FIBER PO Take 2 tablets by mouth daily.     gabapentin  (NEURONTIN ) 100 MG capsule Take 1 capsule (100 mg total) by mouth at bedtime. 30 capsule 3   hydroxyurea  (HYDREA ) 500 MG capsule Take 1 capsule (500 mg total) by mouth every other day. May take with food to minimize GI side effects. 30 capsule 4   levocetirizine (XYZAL ) 5 MG tablet TAKE 1 TABLET BY MOUTH EVERY DAY IN THE EVENING 90 tablet 1   linaclotide  (LINZESS ) 290 MCG CAPS capsule Take 1 capsule (290 mcg total) by mouth daily before breakfast. 90 capsule 5   mupirocin  ointment (BACTROBAN ) 2 % Apply 1 Application topically 2 (two) times daily. 22 g 0   nitazoxanide  (ALINIA ) 500 MG tablet Take 1 tablet (500 mg total) by mouth 2 (two) times daily with a meal. 20 tablet 0   NON FORMULARY Take 0.5 tablets by mouth at bedtime. CBD oil - for pain Gummies     Polyethyl Glycol-Propyl Glycol (SYSTANE OP) Place 1 drop into both eyes in the morning and at bedtime.     PREMARIN  vaginal cream Place 0.25 Applicatorfuls vaginally See admin instructions. Insert 0.5 grams vaginally every other night 30 g 12   PRESCRIPTION MEDICATION CBD gummies nightly     sodium zirconium cyclosilicate  (LOKELMA ) 5 g packet 5 gram pack twice a week(Tuesday and Thursday) 24 packet 1   triamcinolone  cream (KENALOG ) 0.1 % Apply 1  Application topically 2 (two) times daily. 30 g 1   Wheat Dextrin (BENEFIBER DRINK MIX PO) Take by mouth daily at 6 (six) AM.     No current facility-administered medications on file prior to visit.    BP 117/71   Pulse 68   Temp (!) 97.5 F (36.4 C)   Ht 5' 4 (1.626 m)   Wt 101 lb 3.2 oz (45.9 kg)   LMP  (LMP Unknown)   SpO2 100%   BMI 17.37 kg/m        Objective:   Physical Exam   General Mental Status- Alert. General Appearance- Not in acute distress.   Skin General: Color- Normal Color. Moisture- Normal Moisture.  Neck Carotid Arteries- Normal color. Moisture- Normal Moisture. No carotid bruits. No JVD.  Chest and Lung Exam Auscultation: Breath Sounds:-CTA  Cardiovascular Auscultation:Rythm- RRR Murmurs & Other Heart Sounds:Auscultation of the heart reveals- No Murmurs.  Abdomen Inspection:-Inspeection Normal. Palpation/Percussion:Note:No mass. Palpation and Percussion of the abdomen reveal- Non Tender, Non Distended + BS, no rebound or guarding.   Neurologic Cranial Nerve exam:- CN III-XII intact(No nystagmus), symmetric smile. Strength:- 5/5 equal and symmetric strength both upper and lower extremities.   Lower ext- negative homans signs. Calfs symmetric.     Assessment & Plan:   Patient Instructions  For you wellness exam today I have ordered CMP, A1c and  lipid panel.  Vaccine appear up to date.  Recommend exercise and healthy diet.  We will let you know lab results as they come in.  Follow up date appointment will be determined after lab review.    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                    [facility] [address] [place], [place] [address] [phone]       Dallas Maxwell, PA-C

## 2024-08-01 NOTE — Patient Instructions (Addendum)
 For you wellness exam today I have ordered CMP, A1c and  lipid panel.  Vaccine appear up to date.  Recommend exercise and healthy diet.  We will let you know lab results as they come in.  Follow up date appointment will be determined after lab review.    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  EKG declined. Heart RRR presently. Brief palpitation yesterday. If any reeoccur or associated symptoms then be seen in the ED.  [facility] [address] [place], [place] [address] [phone]  Preventive Care 65 Years and Older, Female Preventive care refers to lifestyle choices and visits with your health care provider that can promote health and wellness. Preventive care visits are also called wellness exams. What can I expect for my preventive care visit? Counseling Your health care provider may ask you questions about your: Medical history, including: Past medical problems. Family medical history. Pregnancy and menstrual history. History of falls. Current health, including: Memory and ability to understand (cognition). Emotional well-being. Home life and relationship well-being. Sexual activity and sexual health. Lifestyle, including: Alcohol, nicotine or tobacco, and drug use. Access to firearms. Diet, exercise, and sleep habits. Work and work astronomer. Sunscreen use. Safety issues such as seatbelt and bike helmet use. Physical exam Your health care provider will check your: Height and weight. These may be used to calculate your BMI (body mass index). BMI is a measurement that tells if you are at a healthy weight. Waist circumference. This measures the distance around your waistline. This measurement also tells if you are at a healthy weight and may help predict your risk of certain diseases, such as type 2 diabetes and high blood pressure. Heart rate and blood pressure. Body temperature. Skin for abnormal spots. What immunizations do I  need?  Vaccines are usually given at various ages, according to a schedule. Your health care provider will recommend vaccines for you based on your age, medical history, and lifestyle or other factors, such as travel or where you work. What tests do I need? Screening Your health care provider may recommend screening tests for certain conditions. This may include: Lipid and cholesterol levels. Hepatitis C test. Hepatitis B test. HIV (human immunodeficiency virus) test. STI (sexually transmitted infection) testing, if you are at risk. Lung cancer screening. Colorectal cancer screening. Diabetes screening. This is done by checking your blood sugar (glucose) after you have not eaten for a while (fasting). Mammogram. Talk with your health care provider about how often you should have regular mammograms. BRCA-related cancer screening. This may be done if you have a family history of breast, ovarian, tubal, or peritoneal cancers. Bone density scan. This is done to screen for osteoporosis. Talk with your health care provider about your test results, treatment options, and if necessary, the need for more tests. Follow these instructions at home: Eating and drinking  Eat a diet that includes fresh fruits and vegetables, whole grains, lean protein, and low-fat dairy products. Limit your intake of foods with high amounts of sugar, saturated fats, and salt. Take vitamin and mineral supplements as recommended by your health care provider. Do not drink alcohol if your health care provider tells you not to drink. If you drink alcohol: Limit how much you have to 0-1 drink a day. Know how much alcohol is in your drink. In the [place] one drink equals one 12 oz bottle of beer (355 mL), one 5 oz glass of wine (148 mL), or one 1 oz glass  of hard liquor (44 mL). Lifestyle Brush your teeth every morning and night with fluoride toothpaste. Floss one time each day. Exercise for at least 30 minutes 5 or more days  each week. Do not use any products that contain nicotine or tobacco. These products include cigarettes, chewing tobacco, and vaping devices, such as e-cigarettes. If you need help quitting, ask your health care provider. Do not use drugs. If you are sexually active, practice safe sex. Use a condom or other form of protection in order to prevent STIs. Take aspirin  only as told by your health care provider. Make sure that you understand how much to take and what form to take. Work with your health care provider to find out whether it is safe and beneficial for you to take aspirin  daily. Ask your health care provider if you need to take a cholesterol-lowering medicine (statin). Find healthy ways to manage stress, such as: Meditation, yoga, or listening to music. Journaling. Talking to a trusted person. Spending time with friends and family. Minimize exposure to UV radiation to reduce your risk of skin cancer. Safety Always wear your seat belt while driving or riding in a vehicle. Do not drive: If you have been drinking alcohol. Do not ride with someone who has been drinking. When you are tired or distracted. While texting. If you have been using any mind-altering substances or drugs. Wear a helmet and other protective equipment during sports activities. If you have firearms in your house, make sure you follow all gun safety procedures. What's next? Visit your health care provider once a year for an annual wellness visit. Ask your health care provider how often you should have your eyes and teeth checked. Stay up to date on all vaccines. This information is not intended to replace advice given to you by your health care provider. Make sure you discuss any questions you have with your health care provider. Document Revised: 02/20/2021 Document Reviewed: 02/20/2021 Elsevier Patient Education  2024 [employer].

## 2024-08-02 ENCOUNTER — Encounter: Payer: Self-pay | Admitting: Pharmacist

## 2024-08-08 ENCOUNTER — Other Ambulatory Visit (HOSPITAL_COMMUNITY): Payer: Self-pay

## 2024-08-08 ENCOUNTER — Other Ambulatory Visit: Payer: Self-pay

## 2024-08-08 ENCOUNTER — Encounter: Payer: Self-pay | Admitting: Gastroenterology

## 2024-08-08 MED ORDER — DENOSUMAB-BBDZ 60 MG/ML ~~LOC~~ SOSY
60.0000 mg | PREFILLED_SYRINGE | Freq: Once | SUBCUTANEOUS | Status: AC
  Administered 2024-08-23: 09:00:00 60 mg via SUBCUTANEOUS

## 2024-08-08 NOTE — Telephone Encounter (Signed)
 Pt is switching to Jubbonti .  Referral has been placed.

## 2024-08-08 NOTE — Progress Notes (Signed)
 Specialty Pharmacy Initial Fill Coordination Note  Katrina Walters is a 68 y.o. female contacted today regarding initial fill of specialty medication(s) Denosumab -bbdz (Jubbonti )   Patient requested Courier to Provider Office   Delivery date: 08/18/24   Verified address: [facility] [address]   Medication will be filled on: 08/17/24   Patient is aware of $0 copayment.

## 2024-08-08 NOTE — Addendum Note (Signed)
 Addended by: ESTELLE GILLIS D on: 08/08/2024 03:12 PM   Modules accepted: Orders

## 2024-08-14 ENCOUNTER — Encounter: Payer: Self-pay | Admitting: Medical

## 2024-08-15 ENCOUNTER — Encounter: Payer: Self-pay | Admitting: Medical

## 2024-08-15 MED ORDER — DICLOFENAC SODIUM 75 MG PO TBEC: 75.0000 mg | DELAYED_RELEASE_TABLET | Freq: Two times a day (BID) | ORAL | 0 refills | Status: AC

## 2024-08-15 NOTE — Addendum Note (Signed)
 Addended by: DORINA DALLAS HERO on: 08/15/2024 04:00 PM   Modules accepted: Orders

## 2024-08-17 ENCOUNTER — Other Ambulatory Visit: Payer: Self-pay

## 2024-08-23 ENCOUNTER — Ambulatory Visit

## 2024-08-23 DIAGNOSIS — M810 Age-related osteoporosis without current pathological fracture: Secondary | ICD-10-CM

## 2024-08-23 MED ORDER — DENOSUMAB-BBDZ 60 MG/ML ~~LOC~~ SOSY: 60.0000 mg | PREFILLED_SYRINGE | Freq: Once | SUBCUTANEOUS | Status: DC

## 2024-08-23 NOTE — Progress Notes (Signed)
 Pt was in office today for Jubbonti  injection, injection was given subcutaneous in L arm. Pt tolerated well.

## 2024-08-24 ENCOUNTER — Inpatient Hospital Stay: Attending: Hematology & Oncology

## 2024-08-24 ENCOUNTER — Inpatient Hospital Stay

## 2024-08-24 ENCOUNTER — Other Ambulatory Visit: Payer: Self-pay

## 2024-08-24 ENCOUNTER — Inpatient Hospital Stay: Admitting: Family

## 2024-08-24 ENCOUNTER — Encounter: Payer: Self-pay | Admitting: Family

## 2024-08-24 VITALS — BP 123/82 | HR 75 | Temp 97.5°F | Resp 18 | Ht 64.0 in | Wt 103.0 lb

## 2024-08-24 DIAGNOSIS — G629 Polyneuropathy, unspecified: Secondary | ICD-10-CM | POA: Diagnosis not present

## 2024-08-24 DIAGNOSIS — D649 Anemia, unspecified: Secondary | ICD-10-CM | POA: Diagnosis not present

## 2024-08-24 DIAGNOSIS — D45 Polycythemia vera: Secondary | ICD-10-CM | POA: Diagnosis not present

## 2024-08-24 DIAGNOSIS — Z79899 Other long term (current) drug therapy: Secondary | ICD-10-CM | POA: Insufficient documentation

## 2024-08-24 DIAGNOSIS — D509 Iron deficiency anemia, unspecified: Secondary | ICD-10-CM | POA: Diagnosis not present

## 2024-08-24 DIAGNOSIS — F458 Other somatoform disorders: Secondary | ICD-10-CM

## 2024-08-24 LAB — RETICULOCYTES
Immature Retic Fract: 13 % (ref 2.3–15.9)
RBC.: 6.26 MIL/uL — ABNORMAL HIGH (ref 3.87–5.11)
Retic Count, Absolute: 67.6 K/uL (ref 19.0–186.0)
Retic Ct Pct: 1.1 % (ref 0.4–3.1)

## 2024-08-24 LAB — IRON AND IRON BINDING CAPACITY (CC-WL,HP ONLY)
Iron: 34 ug/dL (ref 28–170)
Saturation Ratios: 7 % — ABNORMAL LOW (ref 10.4–31.8)
TIBC: 466 ug/dL — ABNORMAL HIGH (ref 250–450)
UIBC: 432 ug/dL

## 2024-08-24 LAB — CMP (CANCER CENTER ONLY)
ALT: 13 U/L (ref 0–44)
AST: 27 U/L (ref 15–41)
Albumin: 4.5 g/dL (ref 3.5–5.0)
Alkaline Phosphatase: 86 U/L (ref 38–126)
Anion gap: 11 (ref 5–15)
BUN: 12 mg/dL (ref 8–23)
CO2: 26 mmol/L (ref 22–32)
Calcium: 10.2 mg/dL (ref 8.9–10.3)
Chloride: 101 mmol/L (ref 98–111)
Creatinine: 0.78 mg/dL (ref 0.44–1.00)
GFR, Estimated: >60 mL/min (ref >60)
Glucose, Bld: 92 mg/dL (ref 70–99)
Potassium: 5.6 mmol/L — ABNORMAL HIGH (ref 3.5–5.1)
Sodium: 137 mmol/L (ref 135–145)
Total Bilirubin: 0.6 mg/dL (ref 0.0–1.2)
Total Protein: 8 g/dL (ref 6.5–8.1)

## 2024-08-24 LAB — CBC WITH DIFFERENTIAL (CANCER CENTER ONLY)
Abs Immature Granulocytes: 0.09 K/uL — ABNORMAL HIGH (ref 0.00–0.07)
Basophils Absolute: 0.1 K/uL (ref 0.0–0.1)
Basophils Relative: 1 %
Eosinophils Absolute: 0.3 K/uL (ref 0.0–0.5)
Eosinophils Relative: 2 %
HCT: 42.7 % (ref 36.0–46.0)
Hemoglobin: 12.1 g/dL (ref 12.0–15.0)
Immature Granulocytes: 1 %
Lymphocytes Relative: 16 %
Lymphs Abs: 2 K/uL (ref 0.7–4.0)
MCH: 19.2 pg — ABNORMAL LOW (ref 26.0–34.0)
MCHC: 28.3 g/dL — ABNORMAL LOW (ref 30.0–36.0)
MCV: 67.9 fL — ABNORMAL LOW (ref 80.0–100.0)
Monocytes Absolute: 0.6 K/uL (ref 0.1–1.0)
Monocytes Relative: 4 %
Neutro Abs: 9.5 K/uL — ABNORMAL HIGH (ref 1.7–7.7)
Neutrophils Relative %: 76 %
Platelet Count: 477 K/uL — ABNORMAL HIGH (ref 150–400)
RBC: 6.29 MIL/uL — ABNORMAL HIGH (ref 3.87–5.11)
RDW: 24.1 % — ABNORMAL HIGH (ref 11.5–15.5)
WBC Count: 12.6 K/uL — ABNORMAL HIGH (ref 4.0–10.5)
nRBC: 0 % (ref 0.0–0.2)

## 2024-08-24 LAB — LACTATE DEHYDROGENASE: LDH: 260 U/L — ABNORMAL HIGH (ref 105–235)

## 2024-08-24 LAB — FERRITIN: Ferritin: 10 ng/mL — ABNORMAL LOW (ref 11–307)

## 2024-08-24 MED ORDER — FOLIC ACID 1 MG PO TABS: 1.0000 mg | ORAL_TABLET | Freq: Every day | ORAL | 11 refills | Status: AC

## 2024-08-24 NOTE — Progress Notes (Signed)
 Hematology and Oncology Follow Up Visit  Katrina Walters [id number] 1956-06-19 68 y.o. 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 She is still having problems with abdominal pain and bloating with constipation. She sees Dr. Kemp with GI and he is aware. She started taking her Linzess  daily 2 weeks ago along with Mirilax but still has not noted any improvement.  No obvious blood loss noted. No petechiae.  She is taking her aspirin  daily and Hydrea  every other day as prescribed.  No fever, chills, n/v, cough, rash, dizziness, chest pain, palpitations or changes in bladder habits at this time.  No swelling in her extremities.  Neuropathy in her toes unchanged.  No falls or syncope reported.  Appetite and hydration ave remained good. Weight is stable at 103 lbs.   ECOG Performance Status: 1 - Symptomatic but completely ambulatory  Medications:  Allergies as of 08/24/2024       Reactions   Amoxicillin     Had fever with flu like symptoms. Has patient had a PCN reaction causing immediate rash, facial/tongue/throat swelling, SOB or lightheadedness with hypotension: no Has patient had a PCN reaction causing severe rash involving mucus membranes or skin necrosis: no Has patient had a PCN reaction that required hospitalization no Has patient had a PCN reaction occurring within the last 10 years: yes If all of the above answers are NO, then may proceed with Cephalosporin use.        Medication List        Accurate as of August 24, 2024 10:11 AM. If you have any questions, ask  your nurse or doctor.          aspirin  81 MG chewable tablet Chew by mouth daily.   BENEFIBER DRINK MIX PO Take by mouth daily at 6 (six) AM.   cholecalciferol 25 MCG (1000 UNIT) tablet Commonly known as: VITAMIN D3 Take 1,000 Units by mouth daily.   colchicine  0.6 MG tablet Take 1 tablet (0.6 mg total) by mouth 2 (two) times daily.   Cranberry-Vitamin C 250-60 MG Caps Take 2 tablets by mouth daily.   D-MANNOSE PO Take 2 tablets by mouth daily.   diclofenac  75 MG EC tablet Commonly known as: VOLTAREN  Take 1 tablet (75 mg total) by mouth 2 (two) times daily.   esomeprazole  40 MG capsule Commonly known as: NEXIUM  Take 40 mg by mouth daily.   ezetimibe  10 MG tablet Commonly known as: ZETIA  Take 1 tablet (10 mg total) by mouth daily. Please keep scheduled appointment for additional refills.   famotidine  20 MG tablet Commonly known as: PEPCID  Take 2 tablets (40 mg total) by mouth daily.   FIBER PO Take 2 tablets by mouth daily.   gabapentin  100 MG capsule Commonly known as: NEURONTIN  Take 1 capsule (100 mg total) by mouth at bedtime.   hydroxyurea  500 MG capsule Commonly known as: HYDREA  Take 1 capsule (500 mg total) by mouth every other day. May take with food to minimize GI side effects.   Jubbonti  60 MG/ML Sosy  injection Generic drug: denosumab -bbdz Inject 60 mg into the skin every 6 (six) months. Dx code: M81.0.  Pt has appt on 08/23/24   levocetirizine 5 MG tablet Commonly known as: XYZAL  TAKE 1 TABLET BY MOUTH EVERY DAY IN THE EVENING   linaclotide  290 MCG Caps capsule Commonly known as: LINZESS  Take 1 capsule (290 mcg total) by mouth daily before breakfast.   Lokelma  5 g packet Generic drug: sodium zirconium cyclosilicate  5 gram pack twice a week(Tuesday and Thursday)   mupirocin  ointment 2 % Commonly known as: BACTROBAN  Apply 1 Application topically 2 (two) times daily.   nitazoxanide  500 MG tablet Commonly known as: ALINIA  Take 1 tablet  (500 mg total) by mouth 2 (two) times daily with a meal.   NON FORMULARY Take 0.5 tablets by mouth at bedtime. CBD oil - for pain Gummies   Premarin  vaginal cream Generic drug: conjugated estrogens  Place 0.25 Applicatorfuls vaginally See admin instructions. Insert 0.5 grams vaginally every other night   PRESCRIPTION MEDICATION CBD gummies nightly   Repatha  SureClick 140 MG/ML Soaj Generic drug: Evolocumab  Inject 140 mg into the skin every 14 (fourteen) days. Please deliver   SYSTANE OP Place 1 drop into both eyes in the morning and at bedtime.   triamcinolone  cream 0.1 % Commonly known as: KENALOG  Apply 1 Application topically 2 (two) times daily.        Allergies: Allergies[1]  Past Medical History, Surgical history, Social history, and Family History were reviewed and updated.  Review of Systems: All other 10 point review of systems is negative.   Physical Exam:  height is 5' 4 (1.626 m) and weight is 103 lb (46.7 kg). Her oral temperature is 97.5 F (36.4 C) (abnormal). Her blood pressure is 123/82 and her pulse is 75. Her respiration is 18 and oxygen saturation is 99%.   Wt Readings from Last 3 Encounters:  08/24/24 103 lb (46.7 kg)  08/01/24 101 lb 3.2 oz (45.9 kg)  07/25/24 100 lb (45.4 kg)    Ocular: Sclerae unicteric, pupils equal, round and reactive to light Ear-nose-throat: Oropharynx clear, dentition fair Lymphatic: No cervical or supraclavicular adenopathy Lungs no rales or rhonchi, good excursion bilaterally Heart regular rate and rhythm, no murmur appreciated Abd soft, nontender, positive bowel sounds MSK no focal spinal tenderness, no joint edema Neuro: non-focal, well-oriented, appropriate affect Breasts: Deferred   Lab Results  Component Value Date   WBC 12.6 (H) 08/24/2024   HGB 12.1 08/24/2024   HCT 42.7 08/24/2024   MCV 67.9 (L) 08/24/2024   PLT 477 (H) 08/24/2024   Lab Results  Component Value Date   FERRITIN 9 (L) 05/18/2024    IRON  23 (L) 05/18/2024   TIBC 445 05/18/2024   UIBC 422 05/18/2024   IRONPCTSAT 5 (L) 05/18/2024   Lab Results  Component Value Date   RETICCTPCT 1.1 08/24/2024   RBC 6.26 (H) 08/24/2024   RBC 6.29 (H) 08/24/2024   No results found for: KPAFRELGTCHN, LAMBDASER, KAPLAMBRATIO No results found for: IGGSERUM, IGA, IGMSERUM No results found for: STEPHANY CARLOTA BENSON MARKEL EARLA JOANNIE DOC VICK, SPEI   Chemistry      Component Value Date/Time   NA 137 08/01/2024 0927   NA 139 04/15/2021 1027   K 5.2 No hemolysis seen (H) 08/01/2024 0927   CL 101 08/01/2024 0927   CO2 30 08/01/2024 0927   BUN 12 08/01/2024 0927   BUN 14 04/15/2021 1027   CREATININE 0.68 08/01/2024 0927   CREATININE 0.67 07/25/2024 0929  CREATININE 0.84 07/14/2016 0914      Component Value Date/Time   CALCIUM  9.4 08/01/2024 0927   ALKPHOS 78 08/01/2024 0927   AST 17 08/01/2024 0927   AST 23 07/25/2024 0929   ALT 13 08/01/2024 0927   ALT 13 07/25/2024 0929   BILITOT 0.7 08/01/2024 0927   BILITOT 0.5 07/25/2024 0929       Impression and Plan: Ms. Vanloan is a very pleasant 68 yo caucasian female with polycythemia vera, JAK 2 positive. She also has IDA secondary to routine phlebotomy.  Iron  studies are pending.  No phlebotomy needed this visit, Hct 42.7%. CBC stable to improved.  Continue same regimen with Hydrea  every other day.  Continue Neurontin  100 mg at bedtime for neuropathic pruritus.  Start folic acid  1 mg PO daily.  I did reach out to let Dr. San know her GI symptoms are persisting despite medication changes.  Follow-up in 6 weeks.   Lauraine Pepper, NP 12/17/202510:11 AM     [1]  Allergies Allergen Reactions   Amoxicillin      Had fever with flu like symptoms. Has patient had a PCN reaction causing immediate rash, facial/tongue/throat swelling, SOB or lightheadedness with hypotension: no Has patient had a PCN reaction causing severe rash  involving mucus membranes or skin necrosis: no Has patient had a PCN reaction that required hospitalization no Has patient had a PCN reaction occurring within the last 10 years: yes If all of the above answers are NO, then may proceed with Cephalosporin use.

## 2024-08-25 ENCOUNTER — Telehealth: Payer: Self-pay

## 2024-08-25 DIAGNOSIS — R140 Abdominal distension (gaseous): Secondary | ICD-10-CM

## 2024-08-25 DIAGNOSIS — K5900 Constipation, unspecified: Secondary | ICD-10-CM

## 2024-08-25 NOTE — Telephone Encounter (Signed)
 Patient aware that she has been referred to Pelvic floor therapy.  She was advised to have abdominal xray completed and then complete a Miralax bowel purge.  Instructions sent to patient via MyChart.  Patient agreed to plan and verbalized understanding.  No further questions or concerns.

## 2024-08-25 NOTE — Telephone Encounter (Signed)
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  If memory serves, she previously had a good response to pelvic floor PT (in addition to Linzess ), but constipation started to recur again earlier this year.  Given ongoing symptoms despite  Linzess  290 mcg, MiraLAX, Nat, can you please place a referral back to pelvic floor PT and see if this may help alleviate her symptoms.  In the meantime, can do abdominal x-ray followed by MiraLAX purge prep.  Thanks. ----- Message ----- From: Franchot Lauraine HERO, NP Sent: 08/24/2024  11:11 AM EST To: Sandor San GAILS, DO  Hey there! I saw [name] today and she is still having constipation and bloating. Would she benefit from a gastric emptying study? She is feeling pretty rough.   Lauraine

## 2024-09-06 MED ORDER — DENOSUMAB-BBDZ 60 MG/ML ~~LOC~~ SOSY: 60.0000 mg | PREFILLED_SYRINGE | Freq: Once | SUBCUTANEOUS | Status: AC

## 2024-09-06 NOTE — Addendum Note (Signed)
 Addended by: ESTELLE GILLIS D on: 09/06/2024 01:45 PM   Modules accepted: Orders

## 2024-09-09 ENCOUNTER — Ambulatory Visit

## 2024-09-12 ENCOUNTER — Ambulatory Visit

## 2024-09-12 ENCOUNTER — Telehealth: Payer: Self-pay

## 2024-09-12 VITALS — BP 120/70 | Ht 62.0 in | Wt 101.0 lb

## 2024-09-12 DIAGNOSIS — M7581 Other shoulder lesions, right shoulder: Secondary | ICD-10-CM | POA: Diagnosis not present

## 2024-09-12 DIAGNOSIS — M19011 Primary osteoarthritis, right shoulder: Secondary | ICD-10-CM

## 2024-09-12 DIAGNOSIS — M7591 Shoulder lesion, unspecified, right shoulder: Secondary | ICD-10-CM | POA: Diagnosis not present

## 2024-09-12 NOTE — Telephone Encounter (Signed)
 FYI Received fax stating that the referral that was put in for GI on 08/11/2024 with [facility] has been denied due to pt already being an established pt with [facility] and also they stated they are not accepting second opinion referrals at this time

## 2024-09-12 NOTE — Progress Notes (Signed)
" ° °  Subjective:    Patient ID: Katrina Walters, female    DOB: 69 y.o., Sep 19, 1955   MRN: [id number]  Chief Complaint: Right shoulder osteoarthritis, supraspinatus tendinosis  History of Present Illness ~50% better 6 weeks of physical therapy since last seen by myself. Intra-articular injection did provide some relief Pain seems to be worse with trying to reach behind her with an externally rotated shoulder. Range of motion is better Wanting to know if her pain improvement is due to healing or a result of her adapting to being in pain all the time.      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  I am pleased with her range of motion and strength improvements.  Recommend continuing current treatment pathway and following up in 6 additional weeks.  Can consider intra-articular glenohumeral joint injection if pain persistent.  Do not feel based on her current trajectory that she will need reverse total shoulder replacement in the foreseeable future.   "

## 2024-09-12 NOTE — Addendum Note (Signed)
 Addended by: CHARLES ROGUE A on: 09/12/2024 12:10 PM   Modules accepted: Level of Service

## 2024-09-19 ENCOUNTER — Encounter: Payer: Self-pay | Admitting: *Deleted

## 2024-09-27 ENCOUNTER — Encounter: Payer: Self-pay | Admitting: Cardiovascular Disease

## 2024-10-05 ENCOUNTER — Ambulatory Visit: Admitting: Podiatry

## 2024-10-05 ENCOUNTER — Inpatient Hospital Stay: Admitting: Family

## 2024-10-05 ENCOUNTER — Inpatient Hospital Stay: Attending: Hematology & Oncology

## 2024-10-05 ENCOUNTER — Inpatient Hospital Stay

## 2024-10-05 DIAGNOSIS — L600 Ingrowing nail: Secondary | ICD-10-CM

## 2024-10-05 NOTE — Patient Instructions (Signed)
 Place 1/4 cup of epsom salts in a quart of warm tap water.  Submerge your foot or feet in the solution and soak for 20 minutes.  This soak should be done twice a day.  Next, remove your foot or feet from solution, blot dry the affected area. Apply ointment and cover if instructed by your doctor.   Caution for Individuals with Diabetes: Before soaking, always check the water temperature with your hand or a thermometer. Diabetes can reduce sensitivity to heat, increasing the risk of burns. Use only warm, not hot water and discontinue use immediately if you experience discomfort or skin irritation.  IF YOUR SKIN BECOMES IRRITATED WHILE USING THESE INSTRUCTIONS, IT IS OKAY TO SWITCH TO  WHITE VINEGAR AND WATER.  As another alternative soak, you may use antibacterial soap and water.  Monitor for any signs/symptoms of infection. Call the office immediately if any occur or go directly to the emergency room. Call with any questions/concerns.

## 2024-10-05 NOTE — Progress Notes (Signed)
 Patient complains of painful ingrown second toe right.  Nail is brittle and breaks and bleeds.  Patient denies fevers, chills, nausea, vomiting.  Objective:  Vitals: Reviewed  General: Well developed, nourished, in no acute distress, alert and oriented x3   Vascular: DP pulse 2/4 bilateral. PT pulse 2/4 bilateral.  Mild edema toe with ingrown nail.  Capillary refill time immediate bilaterally  Dermatology: Erythema, edema, incurvated nail border both second toe right with no drainage .  Dried blood under nail plate.  Second toe right tenderness present with palpation. Normal skin tone and texture feet with normal hair growth.  Neurological: Grossly intact. Normal reflexes.   Musculoskeletal: Tenderness with palpation of the distal second toe right. No tenderness or painful ROM at IPJ.  Diagnosis: Ingrown nail second toe right  Plan: -discussed etiology and treatment of ingrown nails. Discussed surgical vs conservative treatment. -Consent signed for appropriate matrixectomy affected nail(s).   Procedure(s):   - Matrixectomy(s) second toe right: Toe anesthetized with 3cc 2:1 mixture 2% Lidocaine  with epinephrine : Sodium Bicarbonate . Surgical site prepped. Digital tourniquet applied.  Avulsion of nail plate. performed. Matrixecomy performed with three 30 second applications of phenol to nail matrix. Site irrigated with alcohol.  Tourniquet released with good vascularity noticed in digit.  Applied triple antibiotic to nailbed and applied gauze and Coban dressing. - Written and oral postoperative instructions given.  -Return for post-op 2 weeks.  JINNY Prentice Binder, DPM

## 2024-10-07 ENCOUNTER — Inpatient Hospital Stay

## 2024-10-07 ENCOUNTER — Inpatient Hospital Stay: Admitting: Family

## 2024-10-07 ENCOUNTER — Other Ambulatory Visit: Payer: Self-pay

## 2024-10-10 ENCOUNTER — Telehealth: Payer: Self-pay | Admitting: Pharmacy Technician

## 2024-10-10 NOTE — Telephone Encounter (Signed)
 Katrina Walters,  Please enter the treatment plan for Leqvio. I have submitted the auth and its pending. Once I have a response from the insurance, I will f/u.  Auth Submission: PENDING Site of care: Site of care: [facility] Payer: [facility] MEDICARE Medication & CPT/J Code(s) submitted: Leqvio (Inclisiran) [id number] Diagnosis Code:  Route of submission (phone, fax, portal): PORATL Phone # Fax # Auth type: Buy/Bill PB Units/visits requested: 284MG  Q3 MONTHS X2, THEN Q6 MONTHS X2 Reference number: [id number] Approval from: 10/10/24 to     [facility]: [place]

## 2024-10-11 ENCOUNTER — Other Ambulatory Visit: Payer: Self-pay | Admitting: Family

## 2024-10-11 DIAGNOSIS — D45 Polycythemia vera: Secondary | ICD-10-CM

## 2024-10-11 DIAGNOSIS — F458 Other somatoform disorders: Secondary | ICD-10-CM

## 2024-10-12 ENCOUNTER — Encounter: Payer: Self-pay | Admitting: Pharmacist

## 2024-10-12 ENCOUNTER — Encounter: Payer: Self-pay | Admitting: Family

## 2024-10-12 NOTE — Telephone Encounter (Signed)
 Katrina Walters, She has been approved for both (Leqvio and the [employer].) She will be scheduled as soon as possible. Luke

## 2024-10-12 NOTE — Telephone Encounter (Signed)
 With her, I am wondering what the coverage for medical is going to look like and whether it would be better to use Rx insurance. It looks like she might have extra help on her Rx coverage as her Repatha  was free, but she doesn't have full medicaid. Waiting for service center response, that's why I hadnt put in referral.  [employer] is an option too, patient will just have to make sure it doesn't expire before getting injection. I will place orders

## 2024-10-14 ENCOUNTER — Ambulatory Visit (INDEPENDENT_AMBULATORY_CARE_PROVIDER_SITE_OTHER): Admitting: Otolaryngology

## 2024-10-14 ENCOUNTER — Encounter (INDEPENDENT_AMBULATORY_CARE_PROVIDER_SITE_OTHER): Payer: Self-pay | Admitting: Otolaryngology

## 2024-10-14 VITALS — BP 110/69 | HR 70

## 2024-10-14 DIAGNOSIS — H6123 Impacted cerumen, bilateral: Secondary | ICD-10-CM

## 2024-10-14 NOTE — Progress Notes (Signed)
 Patient ID: Katrina Walters, female   DOB: Jan 10, 1956, 69 y.o.   MRN: [id number]  Procedure: Bilateral cerumen disimpaction.     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     Description: The patient is placed supine on the exam table.  Under the operating microscope, the right ear canal is examined and is noted to be completely impacted with cerumen.  The cerumen is carefully removed with a combination of suction catheters, cerumen curette, and alligator forceps.  After the cerumen removal, the ear canal and tympanic membrane are noted to be normal.  No middle ear effusion is noted.  The same procedure is then repeated on the left side without exception. The patient tolerated the procedure well.   Follow-up care:   The patient will follow up in 6 months.

## 2024-10-18 ENCOUNTER — Inpatient Hospital Stay

## 2024-10-18 ENCOUNTER — Inpatient Hospital Stay: Admitting: Family

## 2024-10-19 ENCOUNTER — Ambulatory Visit: Admitting: Podiatry

## 2024-10-24 ENCOUNTER — Ambulatory Visit

## 2024-11-10 ENCOUNTER — Ambulatory Visit: Admitting: Medical

## 2024-11-24 ENCOUNTER — Ambulatory Visit: Admitting: Cardiovascular Disease

## 2025-04-04 ENCOUNTER — Ambulatory Visit

## 2025-04-14 ENCOUNTER — Ambulatory Visit (INDEPENDENT_AMBULATORY_CARE_PROVIDER_SITE_OTHER): Admitting: Otolaryngology
